# Patient Record
Sex: Female | Born: 1990 | Race: White | Hispanic: No | Smoking: Current every day smoker
Health system: Southern US, Community
[De-identification: ages and names within clinical notes are randomized; demographics above are authoritative.]

## PROBLEM LIST (undated history)

## (undated) ENCOUNTER — Emergency Department (HOSPITAL_COMMUNITY): Discharge status: Against Medical Advice | Payer: Medicaid Other

## (undated) ENCOUNTER — Inpatient Hospital Stay: Payer: Self-pay

## (undated) DIAGNOSIS — F419 Anxiety disorder, unspecified: Secondary | ICD-10-CM

## (undated) DIAGNOSIS — B1920 Unspecified viral hepatitis C without hepatic coma: Secondary | ICD-10-CM

## (undated) DIAGNOSIS — B2790 Infectious mononucleosis, unspecified without complication: Secondary | ICD-10-CM

## (undated) DIAGNOSIS — F1910 Other psychoactive substance abuse, uncomplicated: Secondary | ICD-10-CM

## (undated) DIAGNOSIS — D649 Anemia, unspecified: Secondary | ICD-10-CM

## (undated) DIAGNOSIS — F1110 Opioid abuse, uncomplicated: Secondary | ICD-10-CM

## (undated) DIAGNOSIS — A749 Chlamydial infection, unspecified: Secondary | ICD-10-CM

## (undated) DIAGNOSIS — F319 Bipolar disorder, unspecified: Secondary | ICD-10-CM

## (undated) DIAGNOSIS — K759 Inflammatory liver disease, unspecified: Secondary | ICD-10-CM

## (undated) DIAGNOSIS — N83209 Unspecified ovarian cyst, unspecified side: Secondary | ICD-10-CM

## (undated) HISTORY — PX: NO PAST SURGERIES: SHX2092

## (undated) HISTORY — PX: HAND SURGERY: SHX662

---
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
## 2012-01-24 ENCOUNTER — Inpatient Hospital Stay (HOSPITAL_COMMUNITY): Payer: Medicaid Other

## 2012-01-24 ENCOUNTER — Encounter (HOSPITAL_COMMUNITY): Payer: Self-pay | Admitting: *Deleted

## 2012-01-24 ENCOUNTER — Inpatient Hospital Stay (HOSPITAL_COMMUNITY)
Admission: AD | Admit: 2012-01-24 | Discharge: 2012-01-24 | Disposition: A | Discharge status: Home / Self Care | Payer: Medicaid Other | Source: Ambulatory Visit | Attending: Family Medicine | Admitting: Family Medicine

## 2012-01-24 DIAGNOSIS — N949 Unspecified condition associated with female genital organs and menstrual cycle: Secondary | ICD-10-CM | POA: Insufficient documentation

## 2012-01-24 DIAGNOSIS — N939 Abnormal uterine and vaginal bleeding, unspecified: Secondary | ICD-10-CM | POA: Insufficient documentation

## 2012-01-24 DIAGNOSIS — N926 Irregular menstruation, unspecified: Secondary | ICD-10-CM | POA: Insufficient documentation

## 2012-01-24 DIAGNOSIS — F17200 Nicotine dependence, unspecified, uncomplicated: Secondary | ICD-10-CM | POA: Insufficient documentation

## 2012-01-24 DIAGNOSIS — R102 Pelvic and perineal pain: Secondary | ICD-10-CM

## 2012-01-24 DIAGNOSIS — F1210 Cannabis abuse, uncomplicated: Secondary | ICD-10-CM | POA: Insufficient documentation

## 2012-01-24 DIAGNOSIS — R1032 Left lower quadrant pain: Secondary | ICD-10-CM | POA: Insufficient documentation

## 2012-01-24 HISTORY — DX: Chlamydial infection, unspecified: A74.9

## 2012-01-24 HISTORY — DX: Unspecified ovarian cyst, unspecified side: N83.209

## 2012-01-24 LAB — CBC
MCH: 27.1 pg (ref 26.0–34.0)
MCV: 85.4 fL (ref 78.0–100.0)
Platelets: 324 10*3/uL (ref 150–400)
RBC: 4.51 MIL/uL (ref 3.87–5.11)
RDW: 14.4 % (ref 11.5–15.5)

## 2012-01-24 LAB — URINE MICROSCOPIC-ADD ON

## 2012-01-24 LAB — WET PREP, GENITAL
Clue Cells Wet Prep HPF POC: NONE SEEN
Trich, Wet Prep: NONE SEEN
Yeast Wet Prep HPF POC: NONE SEEN

## 2012-01-24 LAB — URINALYSIS, ROUTINE W REFLEX MICROSCOPIC
Bilirubin Urine: NEGATIVE
Glucose, UA: NEGATIVE mg/dL
Ketones, ur: NEGATIVE mg/dL
Protein, ur: 30 mg/dL — AB

## 2012-01-24 MED ORDER — IBUPROFEN 600 MG PO TABS: 600.0000 mg | ORAL_TABLET | Freq: Four times a day (QID) | ORAL | Status: AC | PRN

## 2012-01-24 NOTE — Discharge Instructions (Signed)
Abnormal Uterine Bleeding Abnormal uterine bleeding can have many causes. Some cases are simply treated, while others are more serious. There are several kinds of bleeding that is considered abnormal, including:  Bleeding between periods.     Bleeding after sexual intercourse.     Spotting anytime in the menstrual cycle.     Bleeding heavier or more than normal.     Bleeding after menopause.  CAUSES   There are many causes of abnormal uterine bleeding. It can be present in teenagers, pregnant women, women during their reproductive years, and women who have reached menopause. Your caregiver will look for the more common causes depending on your age, signs, symptoms and your particular circumstance. Most cases are not serious and can be treated. Even the more serious causes, like cancer of the female organs, can be treated adequately if found in the early stages. That is why all types of bleeding should be evaluated and treated as soon as possible. DIAGNOSIS   Diagnosing the cause may take several kinds of tests. Your caregiver may:  Take a complete history of the type of bleeding.     Perform a complete physical exam and Pap smear.     Take an ultrasound on the abdomen showing a picture of the female organs and the pelvis.     Inject dye into the uterus and Fallopian tubes and X-ray them (hysterosalpingogram).     Place fluid in the uterus and do an ultrasound (sonohysterogrqphy).     Take a CT scan to examine the female organs and pelvis.     Take an MRI to examine the female organs and pelvis. There is no X-ray involved with this procedure.     Look inside the uterus with a telescope that has a light at the end (hysteroscopy).     Scrap the inside of the uterus to get tissue to examine (Dilatation and Curettage, D&C).     Look into the pelvis with a telescope that has a light at the end (laparoscopy). This is done through a very small cut (incision) in the abdomen.  TREATMENT     Treatment will depend on the cause of the abnormal bleeding. It can include:  Doing nothing to allow the problem to take care of itself over time.     Hormone treatment.     Birth control pills.     Treating the medical condition causing the problem.     Laparoscopy.    Major or minor surgery     Destroying the lining of the uterus with electrical currant, laser, freezing or heat (uterine ablation).  HOME CARE INSTRUCTIONS    Follow your caregiver's recommendation on how to treat your problem.     See your caregiver if you missed a menstrual period and think you may be pregnant.     If you are bleeding heavily, count the number of pads/tampons you use and how often you have to change them. Tell this to your caregiver.     Avoid sexual intercourse until the problem is controlled.  SEEK MEDICAL CARE IF:    You have any kind of abnormal bleeding mentioned above.     You feel dizzy at times.     You are 21 years old and have not had a menstrual period yet.  SEEK IMMEDIATE MEDICAL CARE IF:    You pass out.     You are changing pads/tampons every 15 to 30 minutes.     You have belly (abdominal)   pain.     You have a temperature of 100 F (37.8 C) or higher.     You become sweaty or weak.     You are passing large blood clots from the vagina.     You start to feel sick to your stomach (nauseous) and throw up (vomit).  Document Released: 11/14/2005 Document Revised: 07/27/2011 Document Reviewed: 04/09/2009 [employer] Patient Information 2012 [employer]. 

## 2012-01-24 NOTE — Progress Notes (Signed)
Patient states that she has been having abdominal pain for the last 4 months that is worse with her periods. Has been having two periods a month for the past two months. Was diagnosed with an ovarian cyst at [facility] ED in November and feels the pain and bleeding are the same. Vomiting daily for several weeks. Able to keep some food down.

## 2012-01-24 NOTE — ED Provider Notes (Signed)
Chart reviewed and agree with management and plan.  

## 2012-01-24 NOTE — ED Provider Notes (Signed)
Meghan Bullock y.o.[id number] presents to [facility] with left lower abdominal pain and vaginal bleeding.    Chief Complaint  Patient presents with  . Vaginal Bleeding  . Abdominal Pain    SUBJECTIVE  HPI: Pt presents to [facility] with report of LLQ pain x4 months and reports having 2 periods a month for the last 2 months.  She desires pregnancy and reports "I need to have something done about the cyst diagnosed in November so I can get pregnant".  Pt was referred to [facility] clinic from [facility] in November 2012 but did not f/u and was not seen at the clinic.  She reports some nausea and vomiting for a few days, but denies dizziness, h/a, urinary symptoms, vaginal itching/burning/discharge, or fever/chills.    OB History    Grav Para Term Preterm Abortions TAB SAB Ect Mult Living   2    1  1         Past Medical History  Diagnosis Date  . Mononucleosis   . Ovarian cyst   . Chlamydia    Past Surgical History  Procedure Date  . No past surgeries    History   Social History  . Marital Status: Single    Spouse Name: N/A    Number of Children: N/A  . Years of Education: N/A   Occupational History  . Not on file.   Social History Main Topics  . Smoking status: Current Everyday Smoker -- 0.5 packs/day for 5 years    Types: Cigarettes  . Smokeless tobacco: Not on file  . Alcohol Use: No  . Drug Use: Yes    Special: Marijuana     Smokes marijuana twice a week  . Sexually Active: Yes    Birth Control/ Protection: None   Other Topics Concern  . Not on file   Social History Narrative  . No narrative on file   No current facility-administered medications on file prior to encounter.   Current Outpatient Prescriptions on File Prior to Encounter  Medication Sig Dispense Refill  . amphetamine-dextroamphetamine (ADDERALL XR) 30 MG 24 hr capsule Take 30 mg by mouth every morning.         Allergies  Allergen Reactions  . Hydrocodone Itching    ROS: Pertinent items in  HPI  OBJECTIVE Blood pressure 128/71, pulse 84, temperature 97.8 F (36.6 C), temperature source Oral, resp. rate 16, height 5' 4.5" (1.638 m), weight 62.959 kg (138 lb 12.8 oz), last menstrual period 01/13/2012, SpO2 100.00%, unknown if currently breastfeeding. GENERAL: Well-developed, well-nourished female in no acute distress.  LUNGS: Clear to auscultation bilaterally.  HEART: Regular rate and rhythm. ABDOMEN: Soft, nontender EXTREMITIES: Nontender, no edema Pelvic exam: Vagina with normal rugae, bright red vaginal bleeding noted; cervix pink, without lesion, anterior position Bimanual exam:  Cervix 0/long/high, anterior, firm; adnexa without enlargement or palpable masses. Tenderness in LLQ of abdomen and left inguinal region Neg CVA tenderness Neg rebound tenderness, Neg McBurney's Point pain  RESULTS Results for orders placed during the hospital encounter of 01/24/12 (from the past 24 hour(s))  URINALYSIS, ROUTINE W REFLEX MICROSCOPIC     Status: Abnormal   Collection Time   01/24/12  2:20 PM      Component Value Range   Color, Urine YELLOW  YELLOW    APPearance CLEAR  CLEAR    Specific Gravity, Urine 1.020  1.005 - 1.030    pH 6.5  5.0 - 8.0    Glucose, UA NEGATIVE  NEGATIVE (mg/dL)  Hgb urine dipstick LARGE (*) NEGATIVE    Bilirubin Urine NEGATIVE  NEGATIVE    Ketones, ur NEGATIVE  NEGATIVE (mg/dL)   Protein, ur 30 (*) NEGATIVE (mg/dL)   Urobilinogen, UA 0.2  0.0 - 1.0 (mg/dL)   Nitrite NEGATIVE  NEGATIVE    Leukocytes, UA NEGATIVE  NEGATIVE   URINE MICROSCOPIC-ADD ON     Status: Abnormal   Collection Time   01/24/12  2:20 PM      Component Value Range   Squamous Epithelial / LPF FEW (*) RARE    RBC / HPF 11-20  <3 (RBC/hpf)   Bacteria, UA RARE  RARE   POCT PREGNANCY, URINE     Status: Normal   Collection Time   01/24/12  2:28 PM      Component Value Range   Preg Test, Ur NEGATIVE  NEGATIVE   CBC     Status: Normal   Collection Time   01/24/12  3:10 PM       Component Value Range   WBC 9.4  4.0 - 10.5 (K/uL)   RBC 4.51  3.87 - 5.11 (MIL/uL)   Hemoglobin 12.2  12.0 - 15.0 (g/dL)   HCT 16.1  09.6 - 04.5 (%)   MCV 85.4  78.0 - 100.0 (fL)   MCH 27.1  26.0 - 34.0 (pg)   MCHC 31.7  30.0 - 36.0 (g/dL)   RDW 40.9  81.1 - 91.4 (%)   Platelets 324  150 - 400 (K/uL)  WET PREP, GENITAL     Status: Abnormal   Collection Time   01/24/12  3:30 PM      Component Value Range   Yeast Wet Prep HPF POC NONE SEEN  NONE SEEN    Trich, Wet Prep NONE SEEN  NONE SEEN    Clue Cells Wet Prep HPF POC NONE SEEN  NONE SEEN    WBC, Wet Prep HPF POC FEW (*) NONE SEEN     IMAGING US Transvaginal Non-ob  01/24/2012  *RADIOLOGY REPORT*  Clinical Data: Left lower quadrant pain and abnormal uterine bleeding  TRANSABDOMINAL AND TRANSVAGINAL ULTRASOUND OF PELVIS Technique:  Both transabdominal and transvaginal ultrasound examinations of the pelvis were performed. Transabdominal technique was performed for global imaging of the pelvis including uterus, ovaries, adnexal regions, and pelvic cul-de-sac.  Comparison: January 13, 2012   It was necessary to proceed with endovaginal exam following the transabdominal exam to visualize the endometrium and adnexa.  Findings:  The uterus is normal in size and echotexture, measuring 7.4 x 2.7 x 3.8 cm.  The endometrial stripe is thin and homogeneous, measuring 3 mm in width.  Both ovaries have a normal size and appearance.  The right ovary measures 4.7 x 2.3 x 2.4 cm, and the left ovary measures 3.4 x 2.0 x 2.0 cm.  There are no adnexal masses or free pelvic fluid.  IMPRESSION: Normal study. No evidence of pelvic mass or other significant abnormality.  Original Report Authenticated By: Brandon Melnick, M.D.   US Pelvis Complete  01/24/2012  *RADIOLOGY REPORT*  Clinical Data: Left lower quadrant pain and abnormal uterine bleeding  TRANSABDOMINAL AND TRANSVAGINAL ULTRASOUND OF PELVIS Technique:  Both transabdominal and transvaginal ultrasound  examinations of the pelvis were performed. Transabdominal technique was performed for global imaging of the pelvis including uterus, ovaries, adnexal regions, and pelvic cul-de-sac.  Comparison: January 13, 2012   It was necessary to proceed with endovaginal exam following the transabdominal exam to visualize the endometrium and adnexa.  Findings:  The uterus is normal in size and echotexture, measuring 7.4 x 2.7 x 3.8 cm.  The endometrial stripe is thin and homogeneous, measuring 3 mm in width.  Both ovaries have a normal size and appearance.  The right ovary measures 4.7 x 2.3 x 2.4 cm, and the left ovary measures 3.4 x 2.0 x 2.0 cm.  There are no adnexal masses or free pelvic fluid.  IMPRESSION: Normal study. No evidence of pelvic mass or other significant abnormality.  Original Report Authenticated By: Brandon Melnick, M.D.   MANAGEMENT Discussed pt with Dr Shawnie Pons and she agrees with POC to D/C home with f/u in Gyn clinic.    ASSESSMENT Abnormal uterine bleeding Pelvic pain  PLAN D/C home with bleeding precautions Ibuprofen 600 mg PO Q 6 hours Plan for f/u in Gyn clinic

## 2012-02-15 ENCOUNTER — Inpatient Hospital Stay: Payer: Self-pay | Admitting: Psychiatry

## 2012-02-15 LAB — COMPREHENSIVE METABOLIC PANEL
Albumin: 3.9 g/dL (ref 3.8–5.6)
Alkaline Phosphatase: 75 U/L — ABNORMAL LOW (ref 82–169)
Anion Gap: 9 (ref 7–16)
BUN: 6 mg/dL — ABNORMAL LOW (ref 9–21)
Bilirubin,Total: 0.2 mg/dL (ref 0.2–1.0)
Chloride: 107 mmol/L (ref 97–107)
Creatinine: 0.7 mg/dL (ref 0.60–1.30)
EGFR (African American): >60
EGFR (Non-African Amer.): >60
Glucose: 112 mg/dL — ABNORMAL HIGH (ref 65–99)
Osmolality: 280 (ref 275–301)
SGOT(AST): 21 U/L (ref 0–26)
SGPT (ALT): 20 U/L
Sodium: 141 mmol/L (ref 132–141)
Total Protein: 7.6 g/dL (ref 6.4–8.6)

## 2012-02-15 LAB — URINALYSIS, COMPLETE
Bilirubin,UR: NEGATIVE
Hyaline Cast: 1
Ketone: NEGATIVE
Nitrite: NEGATIVE
Ph: 6 (ref 4.5–8.0)
Protein: NEGATIVE
Specific Gravity: 1.019 (ref 1.003–1.030)
WBC UR: 11 /HPF (ref 0–5)

## 2012-02-15 LAB — DRUG SCREEN, URINE
Amphetamines, Ur Screen: NEGATIVE (ref ?–1000)
Barbiturates, Ur Screen: NEGATIVE (ref ?–200)
Benzodiazepine, Ur Scrn: POSITIVE (ref ?–200)
Cannabinoid 50 Ng, Ur ~~LOC~~: POSITIVE (ref ?–50)
MDMA (Ecstasy)Ur Screen: NEGATIVE (ref ?–500)
Methadone, Ur Screen: NEGATIVE (ref ?–300)
Opiate, Ur Screen: POSITIVE (ref ?–300)
Phencyclidine (PCP) Ur S: NEGATIVE (ref ?–25)

## 2012-02-15 LAB — CBC WITH DIFFERENTIAL/PLATELET
Basophil #: 0 10*3/uL (ref 0.0–0.1)
Eosinophil %: 3 %
Lymphocyte #: 3 10*3/uL (ref 1.0–3.6)
Lymphocyte %: 22.9 %
MCV: 85 fL (ref 80–100)
Monocyte %: 7 %
Neutrophil #: 8.6 10*3/uL — ABNORMAL HIGH (ref 1.4–6.5)
RBC: 4.61 10*6/uL (ref 3.80–5.20)

## 2012-02-15 LAB — ACETAMINOPHEN LEVEL: Acetaminophen: <2 ug/mL

## 2012-02-15 LAB — PREGNANCY, URINE: Pregnancy Test, Urine: NEGATIVE m[IU]/mL

## 2012-02-15 LAB — ETHANOL
Ethanol %: <0.003 % (ref 0.000–0.080)
Ethanol: <3 mg/dL

## 2012-02-15 LAB — LITHIUM LEVEL: Lithium: 0.23 mmol/L — ABNORMAL LOW

## 2012-02-16 LAB — TSH: Thyroid Stimulating Horm: 0.402 u[IU]/mL — ABNORMAL LOW

## 2012-02-16 LAB — FOLATE: Folic Acid: 19.2 ng/mL (ref 3.1–100.0)

## 2012-02-19 LAB — BASIC METABOLIC PANEL
Calcium, Total: 9.3 mg/dL (ref 9.0–10.7)
Chloride: 104 mmol/L (ref 97–107)
EGFR (Non-African Amer.): >60
Glucose: 90 mg/dL (ref 65–99)
Osmolality: 269 (ref 275–301)
Potassium: 3.9 mmol/L (ref 3.3–4.7)
Sodium: 135 mmol/L (ref 132–141)

## 2012-02-19 LAB — LITHIUM LEVEL: Lithium: 0.74 mmol/L

## 2012-02-22 ENCOUNTER — Encounter: Payer: Medicaid Other | Admitting: Obstetrics and Gynecology

## 2012-02-28 ENCOUNTER — Emergency Department (HOSPITAL_COMMUNITY)
Admission: EM | Admit: 2012-02-28 | Discharge: 2012-02-28 | Disposition: A | Discharge status: Home / Self Care | Payer: Medicaid Other | Attending: Emergency Medicine | Admitting: Emergency Medicine

## 2012-02-28 ENCOUNTER — Encounter (HOSPITAL_COMMUNITY): Payer: Self-pay | Admitting: Emergency Medicine

## 2012-02-28 DIAGNOSIS — M79609 Pain in unspecified limb: Secondary | ICD-10-CM | POA: Insufficient documentation

## 2012-02-28 DIAGNOSIS — S51009A Unspecified open wound of unspecified elbow, initial encounter: Secondary | ICD-10-CM | POA: Insufficient documentation

## 2012-02-28 DIAGNOSIS — S41109A Unspecified open wound of unspecified upper arm, initial encounter: Secondary | ICD-10-CM | POA: Insufficient documentation

## 2012-02-28 DIAGNOSIS — M542 Cervicalgia: Secondary | ICD-10-CM | POA: Insufficient documentation

## 2012-02-28 DIAGNOSIS — S51011A Laceration without foreign body of right elbow, initial encounter: Secondary | ICD-10-CM

## 2012-02-28 MED ORDER — MORPHINE SULFATE 4 MG/ML IJ SOLN
4.0000 mg | Freq: Once | INTRAMUSCULAR | Status: AC
  Administered 2012-02-28: 4 mg via INTRAVENOUS
  Filled 2012-02-28: qty 1

## 2012-02-28 MED ORDER — CEPHALEXIN 250 MG PO CAPS
500.0000 mg | ORAL_CAPSULE | Freq: Once | ORAL | Status: AC
  Administered 2012-02-28: 500 mg via ORAL

## 2012-02-28 MED ORDER — TETANUS-DIPHTH-ACELL PERTUSSIS 5-2.5-18.5 LF-MCG/0.5 IM SUSP
INTRAMUSCULAR | Status: AC
  Administered 2012-02-28: 0.5 mL via INTRAMUSCULAR
  Filled 2012-02-28: qty 0.5

## 2012-02-28 MED ORDER — CEPHALEXIN 500 MG PO CAPS: 500.0000 mg | ORAL_CAPSULE | Freq: Four times a day (QID) | ORAL | Status: AC

## 2012-02-28 MED ORDER — ONDANSETRON HCL 4 MG/2ML IJ SOLN
INTRAMUSCULAR | Status: AC
  Administered 2012-02-28: 4 mg via INTRAVENOUS
  Filled 2012-02-28: qty 2

## 2012-02-28 MED ORDER — FENTANYL CITRATE 0.05 MG/ML IJ SOLN
INTRAMUSCULAR | Status: AC
  Administered 2012-02-28: 50 ug via INTRAVENOUS
  Filled 2012-02-28: qty 2

## 2012-02-28 MED ORDER — OXYCODONE-ACETAMINOPHEN 5-325 MG PO TABS: 2.0000 | ORAL_TABLET | ORAL | Status: AC | PRN

## 2012-02-28 MED ORDER — CEPHALEXIN 250 MG PO CAPS
ORAL_CAPSULE | ORAL | Status: AC
  Administered 2012-02-28: 500 mg via ORAL
  Filled 2012-02-28: qty 2

## 2012-02-28 MED ORDER — TETANUS-DIPHTH-ACELL PERTUSSIS 5-2.5-18.5 LF-MCG/0.5 IM SUSP
0.5000 mL | Freq: Once | INTRAMUSCULAR | Status: AC
  Administered 2012-02-28: 0.5 mL via INTRAMUSCULAR
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
## 2012-02-28 NOTE — ED Notes (Signed)
Suturing completed; wound dressed with 4x4 and curlex; sling applied by ortho tech.

## 2012-02-28 NOTE — ED Notes (Signed)
MD at bedside suturing; pt tolerating well.

## 2012-02-28 NOTE — ED Notes (Signed)
Sheriff deputy at bedside. 

## 2012-02-28 NOTE — ED Notes (Signed)
PT ambulated with a steady gait; VSS; A&Ox3; no signs of distress; respirations even and unlabored; skin warm and dry; no questions at this time.  

## 2012-02-28 NOTE — ED Notes (Signed)
Patient's family to come back. Pt still tearful and crying.
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
## 2012-02-28 NOTE — ED Notes (Signed)
Depth of wound to be determined by EDP consult to ortho pending

## 2012-05-26 ENCOUNTER — Encounter (HOSPITAL_COMMUNITY): Payer: Self-pay | Admitting: *Deleted

## 2012-05-26 DIAGNOSIS — R509 Fever, unspecified: Secondary | ICD-10-CM | POA: Insufficient documentation

## 2012-05-26 DIAGNOSIS — R112 Nausea with vomiting, unspecified: Secondary | ICD-10-CM | POA: Insufficient documentation

## 2012-05-26 DIAGNOSIS — R059 Cough, unspecified: Secondary | ICD-10-CM | POA: Insufficient documentation

## 2012-05-26 DIAGNOSIS — R05 Cough: Secondary | ICD-10-CM | POA: Insufficient documentation

## 2012-05-26 NOTE — ED Notes (Signed)
Pt states that she has had body aches for the past three days, and the fever came on today. pt states that she feels weak and has not been able to eat much over the past couple of days because it causes abdominal pain.

## 2012-05-27 ENCOUNTER — Emergency Department (HOSPITAL_COMMUNITY)
Admission: EM | Admit: 2012-05-27 | Discharge: 2012-05-27 | Disposition: A | Discharge status: Home / Self Care | Payer: Medicaid Other | Attending: Emergency Medicine | Admitting: Emergency Medicine

## 2012-05-27 DIAGNOSIS — B349 Viral infection, unspecified: Secondary | ICD-10-CM

## 2012-05-27 LAB — URINALYSIS, ROUTINE W REFLEX MICROSCOPIC
Protein, ur: NEGATIVE mg/dL
Urobilinogen, UA: 0.2 mg/dL (ref 0.0–1.0)

## 2012-05-27 LAB — PREGNANCY, URINE: Preg Test, Ur: NEGATIVE

## 2012-05-27 MED ORDER — SODIUM CHLORIDE 0.9 % IV BOLUS (SEPSIS)
1000.0000 mL | Freq: Once | INTRAVENOUS | Status: AC
  Administered 2012-05-27: 1000 mL via INTRAVENOUS

## 2012-05-27 MED ORDER — ACETAMINOPHEN 325 MG PO TABS
650.0000 mg | ORAL_TABLET | Freq: Once | ORAL | Status: AC
  Administered 2012-05-27: 650 mg via ORAL
  Filled 2012-05-27: qty 2

## 2012-05-27 MED ORDER — OXYCODONE-ACETAMINOPHEN 5-325 MG PO TABS: 1.0000 | ORAL_TABLET | ORAL | Status: DC | PRN

## 2012-05-27 MED ORDER — MORPHINE SULFATE 4 MG/ML IJ SOLN
4.0000 mg | Freq: Once | INTRAMUSCULAR | Status: AC
  Administered 2012-05-27: 4 mg via INTRAVENOUS
  Filled 2012-05-27: qty 1

## 2012-05-27 MED ORDER — KETOROLAC TROMETHAMINE 30 MG/ML IJ SOLN
30.0000 mg | Freq: Once | INTRAMUSCULAR | Status: AC
  Administered 2012-05-27: 30 mg via INTRAVENOUS
  Filled 2012-05-27: qty 1

## 2012-05-27 MED ORDER — IBUPROFEN 400 MG PO TABS
400.0000 mg | ORAL_TABLET | Freq: Once | ORAL | Status: AC
  Administered 2012-05-27: 400 mg via ORAL
  Filled 2012-05-27: qty 1

## 2012-05-27 MED ORDER — ONDANSETRON HCL 4 MG/2ML IJ SOLN
4.0000 mg | Freq: Once | INTRAMUSCULAR | Status: AC
  Administered 2012-05-27: 4 mg via INTRAVENOUS
  Filled 2012-05-27: qty 2

## 2012-05-27 NOTE — ED Notes (Signed)
Pt reports lower abdominal pain, n/v/d for the past three days.  Pt stated that she had a fever yesterday of 101.0.

## 2012-05-27 NOTE — ED Notes (Signed)
Waiting to speak to a provide about pain med and ABT.

## 2012-05-27 NOTE — ED Notes (Signed)
Went in to discharge pt, stated she will not leave until to speak to provide to give her pain and antbiotic

## 2012-05-27 NOTE — ED Provider Notes (Signed)
History     CSN: [id number]  Arrival date & time 05/26/12  2307   First MD Initiated Contact with Patient 05/27/12 0119      Chief Complaint  Patient presents with  . Generalized Body Aches  . Fever     Patient is a 21 y.o. female presenting with fever. The history is provided by the patient.  Fever Primary symptoms of the febrile illness include fever, fatigue, headaches, cough, abdominal pain, nausea, vomiting and diarrhea. Primary symptoms do not include shortness of breath, altered mental status or rash. The current episode started 2 days ago. This is a new problem. The problem has been gradually worsening.  pt reports fever, vomiting, diarrhea, mylagias, sore throat, and cough for past 2 days No vaginal discharge No dysuria No rash No travel No tick bites No sick contacts  Past Medical History  Diagnosis Date  . Mononucleosis   . Ovarian cyst   . Chlamydia     Past Surgical History  Procedure Date  . No past surgeries     History reviewed. No pertinent family history.  History  Substance Use Topics  . Smoking status: Current Everyday Smoker -- 0.5 packs/day for 5 years    Types: Cigarettes  . Smokeless tobacco: Not on file  . Alcohol Use: No    OB History    Grav Para Term Preterm Abortions TAB SAB Ect Mult Living   2    1  1          Review of Systems  Constitutional: Positive for fever and fatigue.  Respiratory: Positive for cough. Negative for shortness of breath.   Gastrointestinal: Positive for nausea, vomiting, abdominal pain and diarrhea.  Skin: Negative for rash.  Neurological: Positive for headaches.  Psychiatric/Behavioral: Negative for altered mental status.  All other systems reviewed and are negative.    Allergies  Hydrocodone and Tramadol  Home Medications  No current outpatient prescriptions on file.  BP 125/70  Pulse 110  Temp 100 F (37.8 C) (Oral)  Resp 20  SpO2 98%  Physical Exam CONSTITUTIONAL: Well developed/well  nourished HEAD AND FACE: Normocephalic/atraumatic EYES: EOMI/PERRL ENMT: Mucous membranes moist, uvula midline, pharynx normal NECK: supple no meningeal signs SPINE:entire spine nontender CV: S1/S2 noted, no murmurs/rubs/gallops noted LUNGS: Lungs are clear to auscultation bilaterally, no apparent distress ABDOMEN: soft, nontender, no rebound or guarding GU:no cva tenderness NEURO: Pt is awake/alert, moves all extremitiesx4 EXTREMITIES: pulses normal, full ROM SKIN: warm, color normal, no rash, no petechiae PSYCH: no abnormalities of mood noted  ED Course  Procedures   Labs Reviewed  URINALYSIS, ROUTINE W REFLEX MICROSCOPIC - Abnormal; Notable for the following:    Leukocytes, UA TRACE (*)     All other components within normal limits  URINE MICROSCOPIC-ADD ON - Abnormal; Notable for the following:    Bacteria, UA FEW (*)     All other components within normal limits  PREGNANCY, URINE   Pt reported what appears to be  viral syndrome.  Well appearing,  IV fluids and then reassess   PT WALKING AROUND ED IN NO DISTRESS SHE FEELS IMPROVED STABLE FOR D/C   MDM  Nursing notes including past medical history and social history reviewed and considered in documentation All labs/vitals reviewed and considered         Joya Gaskins, MD 05/27/12 [phone]

## 2012-06-05 ENCOUNTER — Emergency Department (HOSPITAL_COMMUNITY)
Admission: EM | Admit: 2012-06-05 | Discharge: 2012-06-05 | Disposition: A | Discharge status: Home / Self Care | Payer: Medicaid Other | Attending: Emergency Medicine | Admitting: Emergency Medicine

## 2012-06-05 ENCOUNTER — Encounter (HOSPITAL_COMMUNITY): Payer: Self-pay | Admitting: *Deleted

## 2012-06-05 DIAGNOSIS — N390 Urinary tract infection, site not specified: Secondary | ICD-10-CM | POA: Insufficient documentation

## 2012-06-05 DIAGNOSIS — A5901 Trichomonal vulvovaginitis: Secondary | ICD-10-CM | POA: Insufficient documentation

## 2012-06-05 DIAGNOSIS — F17200 Nicotine dependence, unspecified, uncomplicated: Secondary | ICD-10-CM | POA: Insufficient documentation

## 2012-06-05 LAB — URINALYSIS, ROUTINE W REFLEX MICROSCOPIC
Bilirubin Urine: NEGATIVE
Nitrite: POSITIVE — AB
Specific Gravity, Urine: 1.023 (ref 1.005–1.030)
pH: 6 (ref 5.0–8.0)

## 2012-06-05 LAB — URINE MICROSCOPIC-ADD ON

## 2012-06-05 LAB — PREGNANCY, URINE: Preg Test, Ur: NEGATIVE

## 2012-06-05 LAB — WET PREP, GENITAL

## 2012-06-05 MED ORDER — METRONIDAZOLE 500 MG PO TABS: 500.0000 mg | ORAL_TABLET | Freq: Two times a day (BID) | ORAL | Status: DC

## 2012-06-05 MED ORDER — CEPHALEXIN 500 MG PO CAPS: 500.0000 mg | ORAL_CAPSULE | Freq: Four times a day (QID) | ORAL | Status: DC

## 2012-06-05 NOTE — ED Provider Notes (Signed)
I saw and evaluated the patient, reviewed the resident's note and I agree with the findings and plan.   Loren Racer, MD 06/05/12 1406

## 2012-06-05 NOTE — ED Provider Notes (Signed)
History     CSN: [id number]  Arrival date & time 06/05/12  0821   First MD Initiated Contact with Patient 06/05/12 [phone]      Chief Complaint  Patient presents with  . Abdominal Pain  . SEXUALLY TRANSMITTED DISEASE    (Consider location/radiation/quality/duration/timing/severity/associated sxs/prior treatment) Patient is a 21 y.o. female presenting with abdominal pain. The history is provided by the patient and medical records.  Abdominal Pain The primary symptoms of the illness include abdominal pain, nausea, vomiting, dysuria and vaginal discharge. The primary symptoms of the illness do not include fever, shortness of breath, diarrhea or vaginal bleeding. The current episode started more than 2 days ago. The onset of the illness was gradual. The problem has not changed since onset. Pain Location: lower. The abdominal pain does not radiate. Pain scale: moderate.  The dysuria is associated with frequency. The dysuria is not associated with hematuria or vaginal pain.  The vaginal discharge is associated with dysuria.  The patient states that she believes she is currently not pregnant. The patient has not had a change in bowel habit. Additional symptoms associated with the illness include frequency. Symptoms associated with the illness do not include chills, constipation, hematuria or back pain.    Past Medical History  Diagnosis Date  . Mononucleosis   . Ovarian cyst   . Chlamydia     Past Surgical History  Procedure Date  . No past surgeries     No family history on file.  History  Substance Use Topics  . Smoking status: Current Everyday Smoker -- 0.5 packs/day for 5 years    Types: Cigarettes  . Smokeless tobacco: Not on file  . Alcohol Use: No    OB History    Grav Para Term Preterm Abortions TAB SAB Ect Mult Living   2    1  1          Review of Systems  Constitutional: Negative for fever and chills.  Respiratory: Negative for cough and shortness of breath.     Cardiovascular: Negative for chest pain and palpitations.  Gastrointestinal: Positive for nausea, vomiting and abdominal pain. Negative for diarrhea, constipation, blood in stool and abdominal distention.  Genitourinary: Positive for dysuria, frequency and vaginal discharge. Negative for hematuria, vaginal bleeding, vaginal pain and pelvic pain.  Musculoskeletal: Negative for back pain.  Skin: Negative for color change and rash.  Neurological: Negative for light-headedness and headaches.  All other systems reviewed and are negative.    Allergies  Hydrocodone and Tramadol  Home Medications  No current outpatient prescriptions on file.  BP 116/83  Pulse 84  Temp 98.4 F (36.9 C) (Oral)  Resp 16  SpO2 100%  Physical Exam  Nursing note and vitals reviewed. Constitutional: She is oriented to person, place, and time. She appears well-developed and well-nourished.  HENT:  Head: Normocephalic and atraumatic.  Eyes: Pupils are equal, round, and reactive to light.  Cardiovascular: Normal rate, regular rhythm, normal heart sounds and intact distal pulses.   Pulmonary/Chest: Effort normal and breath sounds normal. No respiratory distress.  Abdominal: Soft. Bowel sounds are normal. She exhibits no distension. There is tenderness (mild in upper abd, moderate in lower abd; no CVA tenderness). There is no rebound and no guarding.  Genitourinary: Cervix exhibits discharge (white, muscousy, malodorous). Cervix exhibits no motion tenderness and no friability. Right adnexum displays no tenderness. Left adnexum displays no tenderness. There is bleeding (small, dark red, no clots) around the vagina.  Increased discomfort with pelvic beyond what is expected, but no CMT or focal tenderness  Neurological: She is alert and oriented to person, place, and time.  Skin: Skin is warm and dry.  Psychiatric: She has a normal mood and affect.    ED Course  Procedures (including critical care  time)  Labs Reviewed  URINALYSIS, ROUTINE W REFLEX MICROSCOPIC - Abnormal; Notable for the following:    APPearance CLOUDY (*)     Hgb urine dipstick LARGE (*)     Protein, ur 30 (*)     Nitrite POSITIVE (*)     Leukocytes, UA MODERATE (*)     All other components within normal limits  WET PREP, GENITAL - Abnormal; Notable for the following:    Trich, Wet Prep RARE (*)     Clue Cells Wet Prep HPF POC FEW (*)     WBC, Wet Prep HPF POC FEW (*)     All other components within normal limits  URINE MICROSCOPIC-ADD ON - Abnormal; Notable for the following:    Squamous Epithelial / LPF FEW (*)     Bacteria, UA MANY (*)     All other components within normal limits  PREGNANCY, URINE  GC/CHLAMYDIA PROBE AMP, GENITAL   No results found.   1. UTI (urinary tract infection)   2. Trichomonal vaginitis       MDM  This is a 21 year old female who presents today with lower abdominal pain, urinary frequency, and "I think I have Chlamydia again." She states that she has had about a week's worth of primarily lower abdominal pain, significant urinary frequency, as well as dysuria. She has had some vaginal discharge and discomfort, and that it feels like when she has previously had Chlamydia. She was seen on 6/30 for generalized body aches and fever; at that time had some nausea and vomiting with a temperature up to 101. Her workup at that time was unremarkable, and she was discharged after symptomatic treatment with a viral syndrome. Since then she has had worsening abdominal pain, frequency, dysuria and discharge. She has not had any further fevers, but does still have some nausea but her vomiting has mostly resolved. She does have mild diffuse abdominal tenderness, moderate in the lower abdomen, and pelvic exam shows moderate discomfort with speculum and bimanual exam, with thick mucousy discharge and a small amount of dark red blood without clots. There is no cervical motion tenderness, no adnexal or  uterine tenderness; exam not consistent with PID or cervicitis.  The patient's labs are significant for a UTI and trichomonas. Discussed with the patient treatment, importance of having her sexual partners treating, avoiding unprotected sex until they are both treated, indications for return, and pt expresses understanding.      Theotis Burrow, MD 06/05/12 [phone]

## 2012-06-05 NOTE — ED Notes (Signed)
Pt reports continual abdominal pain since last visit last week. Reports urinary frequency, painful urination, white vaginal discharge.

## 2012-06-06 LAB — GC/CHLAMYDIA PROBE AMP, GENITAL
Chlamydia, DNA Probe: NEGATIVE
GC Probe Amp, Genital: POSITIVE — AB

## 2012-06-07 NOTE — ED Notes (Addendum)
+  Gonorrhea. Chart sent to EDP office for review. 

## 2012-06-08 ENCOUNTER — Encounter (HOSPITAL_COMMUNITY): Payer: Self-pay | Admitting: Emergency Medicine

## 2012-06-08 ENCOUNTER — Emergency Department (HOSPITAL_COMMUNITY)
Admission: EM | Admit: 2012-06-08 | Discharge: 2012-06-08 | Disposition: A | Discharge status: Home / Self Care | Payer: Medicaid Other | Attending: Emergency Medicine | Admitting: Emergency Medicine

## 2012-06-08 DIAGNOSIS — A5400 Gonococcal infection of lower genitourinary tract, unspecified: Secondary | ICD-10-CM | POA: Insufficient documentation

## 2012-06-08 DIAGNOSIS — F17200 Nicotine dependence, unspecified, uncomplicated: Secondary | ICD-10-CM | POA: Insufficient documentation

## 2012-06-08 DIAGNOSIS — A549 Gonococcal infection, unspecified: Secondary | ICD-10-CM

## 2012-06-08 MED ORDER — AZITHROMYCIN 250 MG PO TABS
1000.0000 mg | ORAL_TABLET | Freq: Once | ORAL | Status: AC
  Administered 2012-06-08: 1000 mg via ORAL
  Filled 2012-06-08: qty 1
  Filled 2012-06-08: qty 4

## 2012-06-08 MED ORDER — CEFTRIAXONE SODIUM 250 MG IJ SOLR
250.0000 mg | Freq: Once | INTRAMUSCULAR | Status: AC
  Administered 2012-06-08: 250 mg via INTRAMUSCULAR
  Filled 2012-06-08: qty 250

## 2012-06-08 MED ORDER — ETODOLAC 500 MG PO TABS: 500.0000 mg | ORAL_TABLET | Freq: Two times a day (BID) | ORAL | Status: DC

## 2012-06-08 NOTE — ED Notes (Signed)
Pt was called today from the hospital and told she needed to be treated for gonorrhea.Pt states she has chills

## 2012-06-08 NOTE — ED Provider Notes (Signed)
History     CSN: [id number]  Arrival date & time 06/08/12  1631   First MD Initiated Contact with Patient 06/08/12 1657      Chief Complaint  Patient presents with  . SEXUALLY TRANSMITTED DISEASE    (Consider location/radiation/quality/duration/timing/severity/associated sxs/prior treatment) HPI Presents to the emergency room to receive a ceftriaxone injection. She was seen in the emergency room earlier this week and had a pelvic exam performed. The culture result was positive for gonorrhea. Patient was discharged home on metronidazole and given a prescription for Keflex to treat urinary tract infection. Patient is having persistent pain. She is not having any nausea vomiting diarrhea or fever. She has been instructed to have her partner treated and to refrain from any intercourse Past Medical History  Diagnosis Date  . Mononucleosis   . Ovarian cyst   . Chlamydia     Past Surgical History  Procedure Date  . No past surgeries     History reviewed. No pertinent family history.  History  Substance Use Topics  . Smoking status: Current Everyday Smoker -- 0.5 packs/day for 5 years    Types: Cigarettes  . Smokeless tobacco: Not on file  . Alcohol Use: No    OB History    Grav Para Term Preterm Abortions TAB SAB Ect Mult Living   2    1  1          Review of Systems  All other systems reviewed and are negative.    Allergies  Hydrocodone; Tylenol; and Tramadol  Home Medications   Current Outpatient Rx  Name Route Sig Dispense Refill  . ASPIRIN-ACETAMINOPHEN-CAFFEINE 250-250-65 MG PO TABS Oral Take 2 tablets by mouth every 6 (six) hours as needed. For pain    . CEPHALEXIN 500 MG PO CAPS Oral Take 500 mg by mouth 4 (four) times daily.    . IBUPROFEN 200 MG PO TABS Oral Take 800 mg by mouth every 6 (six) hours as needed. For pain    . METRONIDAZOLE 500 MG PO TABS Oral Take 500 mg by mouth 2 (two) times daily.    . ETODOLAC 500 MG PO TABS Oral Take 1 tablet (500 mg  total) by mouth 2 (two) times daily. 20 tablet 0    BP 115/69  Pulse 94  Temp 98.8 F (37.1 C) (Oral)  Resp 16  SpO2 99%  Physical Exam  Nursing note and vitals reviewed. Constitutional: She appears well-developed and well-nourished. No distress.  HENT:  Head: Normocephalic and atraumatic.  Right Ear: External ear normal.  Left Ear: External ear normal.  Eyes: Conjunctivae are normal. Right eye exhibits no discharge. Left eye exhibits no discharge. No scleral icterus.  Neck: Neck supple. No tracheal deviation present.  Cardiovascular: Normal rate.   Pulmonary/Chest: Effort normal. No stridor. No respiratory distress.  Abdominal: There is tenderness (suprapubic).  Musculoskeletal: She exhibits no edema.  Neurological: She is alert. Cranial nerve deficit: no gross deficits.  Skin: Skin is warm and dry. No rash noted.  Psychiatric: She has a normal mood and affect.    ED Course  Procedures (including critical care time)  Labs Reviewed - No data to display No results found.   1. Gonorrhea       MDM  Treated for std exposure.  Change med to etodolac as pt states that naprosyn was not effective        Celene Kras, MD 06/08/12 1707

## 2012-06-08 NOTE — ED Notes (Signed)
Patient should return for Ceftriaxone 250 mg IM X 1 per Lorenz Coaster PAC.

## 2012-06-08 NOTE — ED Notes (Signed)
Patient informed of positive results after idx 2 and informed of need to notify partner to be treated. 

## 2012-07-24 ENCOUNTER — Emergency Department (HOSPITAL_COMMUNITY): Payer: Medicaid Other

## 2012-07-24 ENCOUNTER — Encounter (HOSPITAL_COMMUNITY): Payer: Self-pay | Admitting: Emergency Medicine

## 2012-07-24 ENCOUNTER — Emergency Department (HOSPITAL_COMMUNITY)
Admission: EM | Admit: 2012-07-24 | Discharge: 2012-07-25 | Disposition: A | Discharge status: Home / Self Care | Payer: Medicaid Other | Attending: Emergency Medicine | Admitting: Emergency Medicine

## 2012-07-24 DIAGNOSIS — F17200 Nicotine dependence, unspecified, uncomplicated: Secondary | ICD-10-CM | POA: Insufficient documentation

## 2012-07-24 DIAGNOSIS — S022XXA Fracture of nasal bones, initial encounter for closed fracture: Secondary | ICD-10-CM

## 2012-07-24 DIAGNOSIS — T07XXXA Unspecified multiple injuries, initial encounter: Secondary | ICD-10-CM

## 2012-07-24 DIAGNOSIS — IMO0002 Reserved for concepts with insufficient information to code with codable children: Secondary | ICD-10-CM | POA: Insufficient documentation

## 2012-07-24 DIAGNOSIS — F411 Generalized anxiety disorder: Secondary | ICD-10-CM | POA: Insufficient documentation

## 2012-07-24 DIAGNOSIS — F319 Bipolar disorder, unspecified: Secondary | ICD-10-CM | POA: Insufficient documentation

## 2012-07-24 HISTORY — DX: Bipolar disorder, unspecified: F31.9

## 2012-07-24 HISTORY — DX: Anxiety disorder, unspecified: F41.9
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
## 2012-07-25 MED ORDER — IBUPROFEN 800 MG PO TABS: 800.0000 mg | ORAL_TABLET | Freq: Three times a day (TID) | ORAL | Status: AC | PRN

## 2012-07-25 NOTE — ED Provider Notes (Signed)
Medical screening examination/treatment/procedure(s) were performed by non-physician practitioner and as supervising physician I was immediately available for consultation/collaboration.  Halsey Hammen K Aritzel Krusemark-Rasch, MD 07/25/12 0209 
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
## 2012-07-31 ENCOUNTER — Emergency Department: Payer: Self-pay | Admitting: Emergency Medicine

## 2012-07-31 LAB — COMPREHENSIVE METABOLIC PANEL
Alkaline Phosphatase: 80 U/L — ABNORMAL LOW (ref 82–169)
Anion Gap: 7 (ref 7–16)
BUN: 6 mg/dL — ABNORMAL LOW (ref 7–18)
Bilirubin,Total: 0.2 mg/dL (ref 0.2–1.0)
Chloride: 105 mmol/L (ref 98–107)
Creatinine: 0.89 mg/dL (ref 0.60–1.30)
EGFR (African American): >60
Glucose: 102 mg/dL — ABNORMAL HIGH (ref 65–99)
SGOT(AST): 14 U/L (ref 0–26)
SGPT (ALT): 18 U/L (ref 12–78)
Total Protein: 6.6 g/dL (ref 6.4–8.6)

## 2012-07-31 LAB — CBC
HCT: 34 % — ABNORMAL LOW (ref 35.0–47.0)
HGB: 11.5 g/dL — ABNORMAL LOW (ref 12.0–16.0)
MCH: 28 pg (ref 26.0–34.0)
MCV: 83 fL (ref 80–100)
Platelet: 251 10*3/uL (ref 150–440)
RBC: 4.1 10*6/uL (ref 3.80–5.20)
RDW: 17.2 % — ABNORMAL HIGH (ref 11.5–14.5)
WBC: 11.1 10*3/uL — ABNORMAL HIGH (ref 3.6–11.0)

## 2012-07-31 LAB — DRUG SCREEN, URINE
Amphetamines, Ur Screen: NEGATIVE (ref ?–1000)
Benzodiazepine, Ur Scrn: POSITIVE (ref ?–200)
Cannabinoid 50 Ng, Ur ~~LOC~~: POSITIVE (ref ?–50)
Cocaine Metabolite,Ur ~~LOC~~: NEGATIVE (ref ?–300)
MDMA (Ecstasy)Ur Screen: POSITIVE (ref ?–500)
Opiate, Ur Screen: POSITIVE (ref ?–300)
Phencyclidine (PCP) Ur S: NEGATIVE (ref ?–25)

## 2012-07-31 LAB — URINALYSIS, COMPLETE
Bacteria: NONE SEEN
Bilirubin,UR: NEGATIVE
Blood: NEGATIVE
Nitrite: NEGATIVE
Specific Gravity: 1.015 (ref 1.003–1.030)
Squamous Epithelial: 2

## 2012-07-31 LAB — ETHANOL
Ethanol %: <0.003 % (ref 0.000–0.080)
Ethanol: <3 mg/dL

## 2012-07-31 LAB — PREGNANCY, URINE: Pregnancy Test, Urine: NEGATIVE m[IU]/mL

## 2012-07-31 LAB — TSH: Thyroid Stimulating Horm: 0.72 u[IU]/mL

## 2012-08-06 ENCOUNTER — Encounter (HOSPITAL_COMMUNITY): Payer: Self-pay | Admitting: *Deleted

## 2012-08-06 ENCOUNTER — Emergency Department (HOSPITAL_COMMUNITY)
Admission: EM | Admit: 2012-08-06 | Discharge: 2012-08-06 | Discharge status: Against Medical Advice | Payer: Medicaid Other | Attending: Emergency Medicine | Admitting: Emergency Medicine

## 2012-08-06 DIAGNOSIS — R1110 Vomiting, unspecified: Secondary | ICD-10-CM | POA: Insufficient documentation

## 2012-08-06 DIAGNOSIS — IMO0001 Reserved for inherently not codable concepts without codable children: Secondary | ICD-10-CM | POA: Insufficient documentation

## 2012-08-06 DIAGNOSIS — R51 Headache: Secondary | ICD-10-CM | POA: Insufficient documentation

## 2012-08-06 NOTE — ED Notes (Signed)
The pt has a cold she has a headache  Vomiting and aching all over her body sorethroat  For 48 hours

## 2012-09-02 ENCOUNTER — Encounter (HOSPITAL_COMMUNITY): Payer: Self-pay | Admitting: Emergency Medicine

## 2012-09-02 ENCOUNTER — Emergency Department (HOSPITAL_COMMUNITY)
Admission: EM | Admit: 2012-09-02 | Discharge: 2012-09-02 | Disposition: A | Discharge status: Home / Self Care | Payer: Medicaid Other | Attending: Emergency Medicine | Admitting: Emergency Medicine

## 2012-09-02 DIAGNOSIS — M7989 Other specified soft tissue disorders: Secondary | ICD-10-CM | POA: Insufficient documentation

## 2012-09-02 NOTE — ED Notes (Signed)
Pt reports that 3 days ago noticed that L finger has been swelling; denies drainage, but does report pain

## 2012-09-02 NOTE — ED Notes (Signed)
No answer in waiting room or triage for room assignment

## 2012-09-03 ENCOUNTER — Emergency Department (INDEPENDENT_AMBULATORY_CARE_PROVIDER_SITE_OTHER)
Admission: EM | Admit: 2012-09-03 | Discharge: 2012-09-03 | Disposition: A | Discharge status: Home / Self Care | Payer: Medicaid Other | Source: Home / Self Care

## 2012-09-03 ENCOUNTER — Encounter (HOSPITAL_COMMUNITY): Payer: Self-pay | Admitting: *Deleted

## 2012-09-03 DIAGNOSIS — L03019 Cellulitis of unspecified finger: Secondary | ICD-10-CM

## 2012-09-03 DIAGNOSIS — M79609 Pain in unspecified limb: Secondary | ICD-10-CM

## 2012-09-03 DIAGNOSIS — M79646 Pain in unspecified finger(s): Secondary | ICD-10-CM

## 2012-09-03 DIAGNOSIS — IMO0001 Reserved for inherently not codable concepts without codable children: Secondary | ICD-10-CM

## 2012-09-03 MED ORDER — NAPROXEN 375 MG PO TABS: 375.0000 mg | ORAL_TABLET | Freq: Two times a day (BID) | ORAL | Status: DC

## 2012-09-03 MED ORDER — CEPHALEXIN 500 MG PO CAPS: 500.0000 mg | ORAL_CAPSULE | Freq: Three times a day (TID) | ORAL | Status: DC

## 2012-09-03 NOTE — ED Notes (Signed)
When asked if she has been taking any IBU or anything for pain during triage, stated, "I'm allergic to IBU, but I've been taking Advil".  Upon discharge, patient asked what she got to help with the pain - informed of Rx for Naproxen, or that she may continue with her Advil.  Then stated, "But she told me she would give me a little something stronger to help me sleep."  Educated pt that Naproxen is stronger than the Advil, and that by taking the abx and performing soaks the pain will start to improve.  Then stated, "What was that medicine again?'.  After explanation and discharge completed, patient stated, "Um, that Naproxen stuff doesn't work."  Instructed she may then choose to continue to just take her Advil.

## 2012-09-03 NOTE — ED Provider Notes (Signed)
Medical screening examination/treatment/procedure(s) were performed by resident physician or non-physician practitioner and as supervising physician I was immediately available for consultation/collaboration.   KINDL,JAMES DOUGLAS MD.    James D Kindl, MD 09/03/12 2049 

## 2012-09-03 NOTE — ED Notes (Signed)
States uses condoms always when sexually active; had negative urine HCG 2 days ago.

## 2012-09-03 NOTE — ED Notes (Signed)
Started to notice swelling and pain to cuticle area of left index finger 5 days ago.  Area is not draining, but pus noted below surface of skin.  States pain and swelling are getting worse each day.  Has been taking Advil and soaking in Epsom salts.

## 2012-09-03 NOTE — ED Provider Notes (Signed)
History     CSN: [id number]  Arrival date & time 09/03/12  1938   None     Chief Complaint  Patient presents with  . Hand Pain    (Consider location/radiation/quality/duration/timing/severity/associated sxs/prior treatment) Patient is a 21 y.o. female presenting with hand pain. The history is provided by the patient.  Hand Pain This is a new problem. The current episode started more than 2 days ago. The problem occurs daily. The problem has been gradually worsening. Pertinent negatives include no abdominal pain. The symptoms are aggravated by bending. Nothing relieves the symptoms. Treatments tried: advil at home. The treatment provided mild relief.   Meghan Bullock is a 21 y.o. female who complains of left index finger pain for five days, no known injury, states noted drainage from finger.  Has been soaking in warm water and took advil for pain.  No history of same.  Reports movement of finger makes symptoms worse.    Past Medical History  Diagnosis Date  . Mononucleosis   . Ovarian cyst   . Chlamydia   . Bipolar 1 disorder   . Anxiety   . Asthma     Past Surgical History  Procedure Date  . No past surgeries     No family history on file.  History  Substance Use Topics  . Smoking status: Current Every Day Smoker -- 1.0 packs/day for 5 years    Types: Cigarettes  . Smokeless tobacco: Not on file  . Alcohol Use: No    OB History    Grav Para Term Preterm Abortions TAB SAB Ect Mult Living   2    1  1          Review of Systems  Constitutional: Negative.   Respiratory: Negative.   Cardiovascular: Negative.   Gastrointestinal: Negative for abdominal pain.  Musculoskeletal: Positive for arthralgias. Negative for myalgias, back pain, joint swelling and gait problem.  Skin: Positive for wound. Negative for color change, pallor and rash.    Allergies  Hydrocodone; Advil; and Tramadol  Home Medications   Current Outpatient Rx  Name Route Sig Dispense  Refill  . PROAIR HFA IN Inhalation Inhale into the lungs as needed.    . CEPHALEXIN 500 MG PO CAPS Oral Take 1 capsule (500 mg total) by mouth 3 (three) times daily. With food 30 capsule 0  . NAPROXEN 375 MG PO TABS Oral Take 1 tablet (375 mg total) by mouth 2 (two) times daily. 20 tablet 0    BP 130/76  Pulse 98  Temp 99.2 F (37.3 C) (Oral)  Resp 16  SpO2 100%  LMP 08/04/2012  Physical Exam  Nursing note and vitals reviewed. Constitutional: She is oriented to person, place, and time. Vital signs are normal. She appears well-developed and well-nourished. She is active and cooperative.  HENT:  Head: Normocephalic.  Eyes: Conjunctivae normal are normal. Pupils are equal, round, and reactive to light. No scleral icterus.  Neck: Trachea normal. Neck supple.  Cardiovascular: Normal rate and regular rhythm.   Pulmonary/Chest: Effort normal and breath sounds normal.  Musculoskeletal: Normal range of motion.       Hands: Neurological: She is alert and oriented to person, place, and time. No cranial nerve deficit or sensory deficit.  Skin: Skin is warm and dry.  Psychiatric: She has a normal mood and affect. Her speech is normal and behavior is normal. Judgment and thought content normal. Cognition and memory are normal.    ED Course  Procedures (including  critical care time)  Labs Reviewed - No data to display No results found.   1. Finger pain   2. Paronychia of second finger, left       MDM  Warm soaks, begin antibiotics as prescribed. Naproxen for pain/discomfort, follow up in 4 days to ensure improvement.        Johnsie Kindred, NP 09/03/12 2048

## 2012-09-05 ENCOUNTER — Emergency Department (HOSPITAL_COMMUNITY)
Admission: EM | Admit: 2012-09-05 | Discharge: 2012-09-05 | Disposition: A | Discharge status: Home / Self Care | Payer: Medicaid Other | Attending: Emergency Medicine | Admitting: Emergency Medicine

## 2012-09-05 ENCOUNTER — Emergency Department (HOSPITAL_COMMUNITY): Payer: Medicaid Other

## 2012-09-05 ENCOUNTER — Encounter (HOSPITAL_COMMUNITY): Payer: Self-pay | Admitting: Emergency Medicine

## 2012-09-05 DIAGNOSIS — IMO0001 Reserved for inherently not codable concepts without codable children: Secondary | ICD-10-CM

## 2012-09-05 DIAGNOSIS — L03012 Cellulitis of left finger: Secondary | ICD-10-CM

## 2012-09-05 DIAGNOSIS — L03019 Cellulitis of unspecified finger: Secondary | ICD-10-CM | POA: Insufficient documentation

## 2012-09-05 DIAGNOSIS — J45909 Unspecified asthma, uncomplicated: Secondary | ICD-10-CM | POA: Insufficient documentation

## 2012-09-05 MED ORDER — SULFAMETHOXAZOLE-TMP DS 800-160 MG PO TABS
1.0000 | ORAL_TABLET | Freq: Once | ORAL | Status: AC
  Administered 2012-09-05: 1 via ORAL
  Filled 2012-09-05: qty 1

## 2012-09-05 MED ORDER — SULFAMETHOXAZOLE-TRIMETHOPRIM 800-160 MG PO TABS: 1.0000 | ORAL_TABLET | Freq: Two times a day (BID) | ORAL | Status: DC

## 2012-09-05 MED ORDER — OXYCODONE-ACETAMINOPHEN 5-325 MG PO TABS: 1.0000 | ORAL_TABLET | ORAL | Status: DC | PRN

## 2012-09-05 MED ORDER — BUPIVACAINE HCL (PF) 0.5 % IJ SOLN
10.0000 mL | Freq: Once | INTRAMUSCULAR | Status: DC
  Filled 2012-09-05: qty 10

## 2012-09-05 NOTE — ED Provider Notes (Signed)
History     CSN: [id number]  Arrival date & time 09/05/12  1219   First MD Initiated Contact with Patient 09/05/12 1428      Chief Complaint  Patient presents with  . Finger Injury    (Consider location/radiation/quality/duration/timing/severity/associated sxs/prior treatment) HPI Comments: Meghan Bullock is a 21 y.o. Female who presents with complaint of left index finger swelling and pain. States swelling first started about a week ago. States was seen at urgent care 2 days ago. started on keflex for infection. Symptoms worsening. No having pain to entire finger, with red streaking down the hand. Pt denies any injuries. Denies fever, chills malailse. Finger painful with any movement and palpation.    Past Medical History  Diagnosis Date  . Mononucleosis   . Ovarian cyst   . Chlamydia   . Bipolar 1 disorder   . Anxiety   . Asthma     Past Surgical History  Procedure Date  . No past surgeries     No family history on file.  History  Substance Use Topics  . Smoking status: Current Every Day Smoker -- 1.0 packs/day for 5 years    Types: Cigarettes  . Smokeless tobacco: Not on file  . Alcohol Use: No    OB History    Grav Para Term Preterm Abortions TAB SAB Ect Mult Living   2    1  1          Review of Systems  Constitutional: Negative for fever and chills.  Musculoskeletal: Positive for joint swelling and arthralgias.  Skin: Positive for color change and wound.  Neurological: Negative for weakness and numbness.  All other systems reviewed and are negative.    Allergies  Hydrocodone; Advil; and Tramadol  Home Medications   Current Outpatient Rx  Name Route Sig Dispense Refill  . ALBUTEROL SULFATE HFA 108 (90 BASE) MCG/ACT IN AERS Inhalation Inhale 2 puffs into the lungs every 6 (six) hours as needed. For shortness of breath    . CEPHALEXIN 500 MG PO CAPS Oral Take 1 capsule (500 mg total) by mouth 3 (three) times daily. With food 30 capsule 0  .  NAPROXEN 375 MG PO TABS Oral Take 1 tablet (375 mg total) by mouth 2 (two) times daily. 20 tablet 0    BP 120/80  Pulse 96  Temp 98.8 F (37.1 C) (Oral)  Resp 18  SpO2 98%  LMP 08/24/2012  Physical Exam  Nursing note and vitals reviewed. Constitutional: She appears well-developed and well-nourished. No distress.  Eyes: Conjunctivae normal are normal.  Cardiovascular: Normal rate, regular rhythm and normal heart sounds.   Pulmonary/Chest: Effort normal and breath sounds normal. No respiratory distress. She has no wheezes.  Musculoskeletal:       Left index finger with impressive paronychia, swelling extends mainly over the dorsal aspect of the finger all the way to the MCP joint. Tender over entire finger. There is a red streak going down dorsal hand, tender to palpation  Neurological: She is alert.  Skin: Skin is warm.  Psychiatric: She has a normal mood and affect. Her behavior is normal.    ED Course  Procedures (including critical care time)  Labs Reviewed - No data to display Dg Finger Index Left  09/05/2012  *RADIOLOGY REPORT*  Clinical Data: Nail bed infection.  LEFT INDEX FINGER 2+V  Comparison: None.  Findings: There is soft tissue swelling along the distal aspect of the finger without underlying acute osseous or joint abnormality.  IMPRESSION: Soft tissue swelling without acute osseous or joint abnormality.   Original Report Authenticated By: Reyes Ivan, M.D.    INCISION AND DRAINAGE Performed by: Jaynie Crumble A Consent: Verbal consent obtained. Risks and benefits: risks, benefits and alternatives were discussed Type: paronychia  Body area: left index finger  Anesthesia: local infiltration  Local anesthetic:bupivacaine 0.5% wo epinephrine, digital block of the 2nd left finger performed with adequate anesthesia  Anesthetic total: 4 ml  Complexity: complex   Drainage: purulent  Drainage amount: large  Wound packed with iodoform gauze  Patient  tolerance: Patient tolerated the procedure well with no immediate complications.   4:02 PM I&D of paronychia performed with large purulent drainage. Pt is able to move finger all the way once numbed.  Wound culture sent. Spoke with Dr. Mina Marble, hand specialist, will follow up with pt in the office. Asked to pack it, warm soaks at home, antibiotics.  Filed Vitals:   09/05/12 1231  BP: 120/80  Pulse: 96  Temp: 98.8 F (37.1 C)  Resp: 18    1. Paronychia of second finger of left hand   2. Cellulitis of finger of left hand       MDM          Lottie Mussel, PA 09/05/12 1604

## 2012-09-05 NOTE — ED Notes (Signed)
Onset one week ago left index finger pain and light pink red no trauma.   Seen at urgent care recently and given antibiotics however pain increasing and not feeling better. Pain 10/10 sharp.

## 2012-09-05 NOTE — ED Provider Notes (Signed)
Meghan Bullock is a 21 y.o. female being evaluated for finger pain, status post treatment for paronychia with oral antibiotics, she has ongoing pain, swelling; without systemic symptoms. On examination. The left index finger has evident paronychia, with small abscess, and mild. Associated swelling of the distal finger. There is no significant felon. There is a red streak extending to the dorsal hand. She has decreased range of motion of the affected finger, due to pain.   Evaluation is consistent with paronychia and lymphangitis. Doubt septic joint, tenosynovitis, or bacteremia.    Medical screening examination/treatment/procedure(s) were conducted as a shared visit with non-physician practitioner(s) and myself.  I personally evaluated the patient during the encounter  Flint Melter, MD 09/05/12 1747

## 2012-09-08 ENCOUNTER — Telehealth (HOSPITAL_COMMUNITY): Payer: Self-pay | Admitting: Emergency Medicine

## 2012-09-08 LAB — WOUND CULTURE

## 2012-09-08 NOTE — ED Notes (Signed)
+  Wound. +MRSA. Patient treated with Septra DS. Sensitive to same. Per protocol MD. Will call and inform patient of +MRSA and appropriate treatment. °

## 2012-11-01 ENCOUNTER — Encounter (HOSPITAL_COMMUNITY): Payer: Self-pay

## 2012-11-01 ENCOUNTER — Emergency Department (HOSPITAL_COMMUNITY)
Admission: EM | Admit: 2012-11-01 | Discharge: 2012-11-01 | Disposition: A | Discharge status: Home / Self Care | Payer: Medicaid Other | Attending: Emergency Medicine | Admitting: Emergency Medicine

## 2012-11-01 DIAGNOSIS — F1210 Cannabis abuse, uncomplicated: Secondary | ICD-10-CM | POA: Insufficient documentation

## 2012-11-01 DIAGNOSIS — Z8742 Personal history of other diseases of the female genital tract: Secondary | ICD-10-CM | POA: Insufficient documentation

## 2012-11-01 DIAGNOSIS — F1910 Other psychoactive substance abuse, uncomplicated: Secondary | ICD-10-CM | POA: Insufficient documentation

## 2012-11-01 DIAGNOSIS — F319 Bipolar disorder, unspecified: Secondary | ICD-10-CM | POA: Insufficient documentation

## 2012-11-01 DIAGNOSIS — Z139 Encounter for screening, unspecified: Secondary | ICD-10-CM

## 2012-11-01 DIAGNOSIS — F1110 Opioid abuse, uncomplicated: Secondary | ICD-10-CM | POA: Insufficient documentation

## 2012-11-01 DIAGNOSIS — F411 Generalized anxiety disorder: Secondary | ICD-10-CM | POA: Insufficient documentation

## 2012-11-01 DIAGNOSIS — J45909 Unspecified asthma, uncomplicated: Secondary | ICD-10-CM | POA: Insufficient documentation

## 2012-11-01 DIAGNOSIS — F1120 Opioid dependence, uncomplicated: Secondary | ICD-10-CM | POA: Insufficient documentation

## 2012-11-01 DIAGNOSIS — F1920 Other psychoactive substance dependence, uncomplicated: Secondary | ICD-10-CM

## 2012-11-01 DIAGNOSIS — F17200 Nicotine dependence, unspecified, uncomplicated: Secondary | ICD-10-CM | POA: Insufficient documentation

## 2012-11-01 DIAGNOSIS — F1191 Opioid use, unspecified, in remission: Secondary | ICD-10-CM | POA: Insufficient documentation

## 2012-11-01 HISTORY — DX: Opioid abuse, uncomplicated: F11.10

## 2012-11-01 LAB — RAPID URINE DRUG SCREEN, HOSP PERFORMED
Cocaine: POSITIVE — AB
Opiates: POSITIVE — AB
Tetrahydrocannabinol: POSITIVE — AB

## 2012-11-01 LAB — COMPREHENSIVE METABOLIC PANEL
ALT: 27 U/L (ref 0–35)
Calcium: 9.2 mg/dL (ref 8.4–10.5)
GFR calc Af Amer: >90 mL/min (ref >90)
Glucose, Bld: 126 mg/dL — ABNORMAL HIGH (ref 70–99)
Sodium: 139 mEq/L (ref 135–145)
Total Protein: 6.7 g/dL (ref 6.0–8.3)

## 2012-11-01 LAB — CBC
Hemoglobin: 12 g/dL (ref 12.0–15.0)
MCH: 28.4 pg (ref 26.0–34.0)
MCHC: 32.9 g/dL (ref 30.0–36.0)

## 2012-11-01 LAB — POCT PREGNANCY, URINE: Preg Test, Ur: NEGATIVE

## 2012-11-01 LAB — ETHANOL: Alcohol, Ethyl (B): <11 mg/dL (ref 0–11)

## 2012-11-01 LAB — SALICYLATE LEVEL: Salicylate Lvl: <2 mg/dL — ABNORMAL LOW (ref 2.8–20.0)

## 2012-11-01 MED ORDER — ACETAMINOPHEN 325 MG PO TABS: 650.0000 mg | ORAL_TABLET | Freq: Four times a day (QID) | ORAL | Status: DC | PRN

## 2012-11-01 NOTE — Discharge Instructions (Signed)
 Chemical Dependency Chemical dependency is an addiction to drugs or alcohol . It is characterized by the repeated behavior of seeking out and using drugs and alcohol  despite harmful consequences to the health and safety of ones self and others.  RISK FACTORS There are certain situations or behaviors that increase a person's risk for chemical dependency. These include:  A family history of chemical dependency.  A history of mental health issues, including depression and anxiety.  A home environment where drugs and alcohol  are easily available to you.  Drug or alcohol  use at a young age. SYMPTOMS  The following symptoms can indicate chemical dependency:  Inability to limit the use of drugs or alcohol .  Nausea, sweating, shakiness, and anxiety that occurs when alcohol  or drugs are not being used.  An increase in amount of drugs or alcohol  that is necessary to get drunk or high. People who experience these symptoms can assess their use of drugs and alcohol  by asking themselves the following questions:  Have you been told by friends or family that they are worried about your use of alcohol  or drugs?  Do friends and family ever tell you about things you did while drinking alcohol  or using drugs that you do not remember?  Do you lie about using alcohol  or drugs or about the amounts you use?  Do you have difficulty completing daily tasks unless you use alcohol  or drugs?  Is the level of your work or school performance lower because of your drug or alcohol  use?  Do you get sick from using drugs or alcohol  but keep using anyway?  Do you feel uncomfortable in social situations unless you use alcohol  or drugs?  Do you use drugs or alcohol  to help forget problems? An answer of yes to any of these questions may indicate chemical dependency. Professional evaluation is suggested. Document Released: 11/08/2001 Document Revised: 02/06/2012 Document Reviewed: 01/20/2011 [facility] Patient  Information 2013 [place], [place].      RESOURCE GUIDE  Chronic Pain Problems: Contact [facility]  [phone] Patients need to be referred by their primary care doctor.  Insufficient Money for Medicine: Contact United Way:  call 211.   No Primary Care Doctor: - Call Health Connect  [phone] - can help you locate a primary care doctor that  accepts your insurance, provides certain services, etc. - Physician Referral [phone]  Agencies that provide inexpensive medical care: - [facility]  [phone] - [facility]  [phone] - [facility]  [phone] - Women's Clinic  [phone] - Planned Parenthood  [phone] [facility]  [phone]  Medicaid-accepting [facility] Providers: - [facility]- [address], [address]  [phone], Mon-Fri 9am-7pm, Sat 9am-1pm - [facility]- [address] [place], [address] [place]  [phone] - [facility]- [address] [place]  [phone] [facility]- [address]  [phone] - Kennieth Leech- [address] [place], [address], [phone]  Only accepts [place] patients after they have their name  applied to their card  Self Pay (no insurance) in [place]: - Sickle Cell Patients: Dr Camellia Hutchinson, [facility] Internal Medicine  [address] [place], [phone] - [facility]- [address] [place]  [phone]       [place] [facility] - [address]       -  [facility]- see information above (Speak to [employer] if you do not have insurance)       -  [facility]- [address] [place],  [phone]       -  [facility]- [address], [phone]       -  Dr Catalina-  [address], [address], [place], [phone]       -  Urgent Medical and [facility] - [address] [phone]       -  [facility]- [address] [phone], also [address] [phone]       -    [facility]- [address] [place], [phone], 1st & 3rd Saturday        every month, 10am-1pm  1) Find a Doctor and Pay Out of Pocket Although you won't have to find out who is covered by your insurance plan, it is a good idea to ask around and get recommendations. You will then need to call the office and see if the doctor you have chosen will accept you as a new patient and what types of options they offer for patients who are self-pay. Some doctors offer discounts or will set up payment plans for their patients who do not have insurance, but you will need to ask so you aren't surprised when you get to your appointment.  2) Contact Your Local Health Department Not all health departments have doctors that can see patients for sick visits, but many do, so it is worth a call to see if yours does. If you don't know where your local health department is, you can check in your phone book. The CDC also has a tool to help you locate your state's health department, and many state websites also have listings of all of their local health departments.  3) Find a Walk-in Clinic If your illness is not likely to be very severe or complicated, you may want to try a walk in clinic. These are popping up all over the country in pharmacies, drugstores, and shopping centers. They're usually staffed by nurse practitioners or physician assistants that have been trained to treat common illnesses and complaints. They're usually fairly quick and inexpensive. However, if you have serious medical issues or chronic medical problems, these are probably not your best option  STD Testing - [facility] Department of [facility] [place], STD Clinic, [address] [place], phone [phone] or [phone].  Monday - Friday, call for an appointment. [facility] Department of [employer], STD  Clinic, [address], [place], phone [phone] or [phone].  Monday - Friday, call for an appointment.  Abuse/Neglect: [facility] Child Abuse Hotline 559-321-8732 [facility] Child Abuse Hotline [phone] (After Hours)  Emergency Shelter:  [facility] [phone]  Maternity Homes: - [facility] [phone] - [facility] [phone]  MRSA Hotline #:   289-516-7922  [facility] Resources [facility]  United Way [facility] Dept. [address]                 [address]         [address] [place] [address]  [address]  Keenan Keenan Phone:  [phone]                                  Phone:  [phone]                   Phone:  [phone]  [facility], [phone] - [facility]- [phone]       -     [facility] in [place], [address]             [phone], Insurance  [place] Child Abuse Hotline (952)632-2293 or 786-677-5745 (After Hours)  Dental Assistance  If unable to pay or uninsured, contact:  [facility]. to become qualified for the adult dental clinic.  Patients with Medicaid: [facility] [phone] [address], [phone] [address], [phone]  If unable to pay, or uninsured, contact [facility] [phone] in [place], [phone] in [facility]) to become qualified for the adult dental clinic  Other Low-Cost Community Dental Services: - [facility]- [address] [place], [place], [place], [address], [phone], Ext. 123, 2nd and 4th Thursday of the month at 6:30am.  10 clients each day by appointment, can sometimes see walk-in patients if someone does not show for an appointment. [facility]- [address] [place], [place], [address], [phone] - [facility] [address], [place], [place], [address], [phone] - [facility]- [phone] [facility] Health Department- [phone] [facility] Health [phone]       Behavioral Health Resources in the [facility]  Intensive Outpatient Programs: [facility]      [address] [place], [place] [phone] Both a day and evening program       [facility] Outpatient     [address]        [place], [place] [address] [phone]         ADS: Alcohol  & Drug Svcs [address] [place] [phone]  [facility] Mental Health ACCESS LINE: [phone] or [phone] [address] [place], [place] [address] [email or web address]  Behavioral Health Services  Substance Abuse Resources: - Alcohol  and Drug Services  [phone] - [facility] [phone] - [facility] [phone] [place] [place] [phone] - Residential & Outpatient Substance Abuse Program  [phone]  Psychological Services: [facility]  [phone] Services  [phone] - [facility] [place], [phone] [place]. [address] [place], ACCESS LINE: [phone] or [phone], [email or web address]  Mobile Crisis Teams:                                        Therapeutic Alternatives         Mobile Crisis Care Unit [phone]             [facility] [address] [place] [phone]  Interventionist [facility] [address] [place] [phone]  Self-Help/Support Groups: Mental Health Assoc. of [employer] of support groups [phone] (call for more info)   Narcotics Anonymous (NA) Caring Services [address] [place] - 2 meetings at this location  Residential Treatment Programs:  [facility]      [address]        [place]       [phone]         [facility] [address], [place] [address]81 [place], [place]  [address] [phone]  [facility] Treatment Facility  [address] [place], [place] [address] [phone] Admissions: 8am-3pm M-F  [facility]     [address]        [place], [place] [address]       [phone]         [facility] [address] Christianna COYER [place], [place] [phone]  [facility] [address] [place], [place] [phone]  [facility] - Intensive Outpatient      [address]     [place], [place]       [phone]         [facility] (Addiction Recovery Care [facility]     [address] [place], [place] [phone] or [phone]  Residential Treatment Services (RTS), Medicaid [address] [place], [place] [phone]  [facility] [address]                                               [address] [place] [phone]  [facility] Resources: [phone]               General Therapy                                                Mliss Rival, PhD        [address] [place], [place] [address]         [phone]   Insurance  [facility] Behavioral   [address] [place], [place] [address] [phone]  [facility] [address] [place], [place] [address] [phone] Insurance/Medicaid/sponsorship through [facility]                                              [address]                                        [place], [place] [address]    Therapy/tele-psych/case         [phone]          [facility] [address] [place], [place]  [address]  Adolescent/group home/case  management [phone]                                            Recardo Rival PhD       General therapy       Insurance   [phone]         Dr. Curry, Insurance, M-F [phone]

## 2012-11-01 NOTE — ED Provider Notes (Addendum)
History   This chart was scribed for Meghan Bullock. Oletta Lamas, MD by Sofie Rower, ED Scribe. The patient was seen in room TR11C/TR11C and the patient's care was started at 2:18PM.    CSN: [id number]  Arrival date & time 11/01/12  1358   First MD Initiated Contact with Patient 11/01/12 1418      Chief Complaint  Patient presents with  . Medical Clearance  . Withdrawal    (Consider location/radiation/quality/duration/timing/severity/associated sxs/prior treatment) The history is provided by the patient. No language interpreter was used.    Meghan Bullock is a 21 y.o. female , with a hx of Heroin abuse, who presents to the Emergency Department complaining of sudden, progressively worsening, withdrawal, onset two days ago (10/30/12). The pt reports she has just been released from jail, however, she admits she has used heroin and smoked marijuana prior to entering rehabilitation. The pt informs her last administration of heroin was two days ago, in addition to smoking marijuana immediately before being evaluated at [facility] today.  The pt is a current everyday smoker (1.0 packs/day), however, she does not drink alcohol.      Past Medical History  Diagnosis Date  . Mononucleosis   . Ovarian cyst   . Chlamydia   . Bipolar 1 disorder   . Anxiety   . Asthma   . Heroin abuse     Past Surgical History  Procedure Date  . No past surgeries     History reviewed. No pertinent family history.  History  Substance Use Topics  . Smoking status: Current Every Day Smoker -- 1.0 packs/day for 5 years    Types: Cigarettes  . Smokeless tobacco: Not on file  . Alcohol Use: No    OB History    Grav Para Term Preterm Abortions TAB SAB Ect Mult Living   2    1  1          Review of Systems  Constitutional: Negative for fever.  Respiratory: Negative for cough and shortness of breath.   Cardiovascular: Negative for chest pain.  Gastrointestinal: Negative for nausea, vomiting and abdominal pain.   Neurological: Negative for speech difficulty.  All other systems reviewed and are negative.    Allergies  Hydrocodone; Advil; and Tramadol  Home Medications   No current outpatient prescriptions on file.  BP 123/64  Pulse 118  Temp 98.8 F (37.1 C) (Oral)  Resp 17  SpO2 100%  Physical Exam  Nursing note and vitals reviewed. Constitutional: She is oriented to person, place, and time. She appears well-developed and well-nourished. No distress.  HENT:  Head: Normocephalic and atraumatic.  Nose: Nose normal.  Neck: Normal range of motion.  Cardiovascular: Regular rhythm and normal heart sounds.  Tachycardia present.   Pulmonary/Chest: Effort normal. No respiratory distress.  Abdominal: Soft. She exhibits no distension. There is no tenderness.  Musculoskeletal: She exhibits no edema.  Neurological: She is alert and oriented to person, place, and time. No cranial nerve deficit. She exhibits normal muscle tone. Coordination normal.  Skin: Skin is warm and dry. She is not diaphoretic.  Psychiatric: She is slowed.       Responses slowed but appropriate. No gross Neuro deficits.     ED Course  Procedures (including critical care time)  DIAGNOSTIC STUDIES: Oxygen Saturation is 100% on room air, normal by my interpretation.    COORDINATION OF CARE:  2:41 PM- Treatment plan discussed with patient. Pt agrees with treatment.      Labs Reviewed  CBC - Abnormal; Notable for the following:    WBC 11.0 (*)     All other components within normal limits  COMPREHENSIVE METABOLIC PANEL - Abnormal; Notable for the following:    Potassium 3.2 (*)     Glucose, Bld 126 (*)     Albumin 3.4 (*)     All other components within normal limits  SALICYLATE LEVEL - Abnormal; Notable for the following:    Salicylate Lvl <2.0 (*)     All other components within normal limits  URINE RAPID DRUG SCREEN (HOSP PERFORMED) - Abnormal; Notable for the following:    Opiates POSITIVE (*)      Cocaine POSITIVE (*)     Benzodiazepines POSITIVE (*)     Tetrahydrocannabinol POSITIVE (*)     All other components within normal limits  ACETAMINOPHEN LEVEL  ETHANOL  POCT PREGNANCY, URINE   No results found.   1. Polysubstance (including opioids) dependence w/o physiol dependence   2. Screening     3:21 PM Spoke to ACT who agrees, that if she is medically cleared, can be discharged and she can [facility] with paperwork showing she has been cleared medically.  K+ of 3.2 is of no consequence, will be corrected with normal diet.  WBC of 11 is also non specific.  No emergent condition based on WBC of 11.    MDM  I personally performed the services described in this documentation, which was scribed in my presence. The recorded information has been reviewed and is accurate.    Pt reports no shaking, seizure activity.  She last used heroin (snorted it) more than 30 hours ago.  There is no withdrawal symptoms.  [facility] is not currently available.  If [facility], then I think pt can go there immediately.  There is not withdrawal or sig medical concerns regarding her marijuana use.  She is medically cleared for further substance abuse help.     Meghan Bullock. Oletta Lamas, MD 11/01/12 1513  Meghan Bullock. Oletta Lamas, MD 11/01/12 1523  Meghan Bullock. Kimmie Doren, MD 11/01/12 [phone]

## 2012-11-01 NOTE — ED Notes (Signed)
Pt here requesting detox from heroin; pt sts was in jail for 35 days until 2 days ago and then used heroin x 1 and smoked marijuana; pt sts spoke with [facility] and they will accept here there

## 2012-11-30 ENCOUNTER — Encounter (HOSPITAL_BASED_OUTPATIENT_CLINIC_OR_DEPARTMENT_OTHER): Payer: Self-pay | Admitting: *Deleted

## 2012-11-30 ENCOUNTER — Emergency Department (HOSPITAL_BASED_OUTPATIENT_CLINIC_OR_DEPARTMENT_OTHER)
Admission: EM | Admit: 2012-11-30 | Discharge: 2012-11-30 | Disposition: A | Discharge status: Home / Self Care | Payer: Medicaid Other | Attending: Emergency Medicine | Admitting: Emergency Medicine

## 2012-11-30 DIAGNOSIS — Z8742 Personal history of other diseases of the female genital tract: Secondary | ICD-10-CM | POA: Insufficient documentation

## 2012-11-30 DIAGNOSIS — IMO0001 Reserved for inherently not codable concepts without codable children: Secondary | ICD-10-CM | POA: Insufficient documentation

## 2012-11-30 DIAGNOSIS — R1110 Vomiting, unspecified: Secondary | ICD-10-CM | POA: Insufficient documentation

## 2012-11-30 DIAGNOSIS — J45909 Unspecified asthma, uncomplicated: Secondary | ICD-10-CM | POA: Insufficient documentation

## 2012-11-30 DIAGNOSIS — Z8619 Personal history of other infectious and parasitic diseases: Secondary | ICD-10-CM | POA: Insufficient documentation

## 2012-11-30 DIAGNOSIS — F319 Bipolar disorder, unspecified: Secondary | ICD-10-CM | POA: Insufficient documentation

## 2012-11-30 DIAGNOSIS — F17200 Nicotine dependence, unspecified, uncomplicated: Secondary | ICD-10-CM | POA: Insufficient documentation

## 2012-11-30 DIAGNOSIS — Z8659 Personal history of other mental and behavioral disorders: Secondary | ICD-10-CM | POA: Insufficient documentation

## 2012-11-30 DIAGNOSIS — F1110 Opioid abuse, uncomplicated: Secondary | ICD-10-CM | POA: Insufficient documentation

## 2012-11-30 LAB — URINALYSIS, ROUTINE W REFLEX MICROSCOPIC
Glucose, UA: NEGATIVE mg/dL
Hgb urine dipstick: NEGATIVE
Specific Gravity, Urine: 1.025 (ref 1.005–1.030)
pH: 8 (ref 5.0–8.0)

## 2012-11-30 LAB — PREGNANCY, URINE: Preg Test, Ur: NEGATIVE

## 2012-11-30 MED ORDER — ONDANSETRON 8 MG PO TBDP
8.0000 mg | ORAL_TABLET | Freq: Once | ORAL | Status: AC
  Administered 2012-11-30: 8 mg via ORAL
  Filled 2012-11-30: qty 1

## 2012-11-30 MED ORDER — PROMETHAZINE HCL 25 MG PO TABS: 25.0000 mg | ORAL_TABLET | Freq: Four times a day (QID) | ORAL | Status: DC | PRN

## 2012-11-30 NOTE — ED Provider Notes (Signed)
History     CSN: [id number]  Arrival date & time 11/30/12  1014   First MD Initiated Contact with Patient 11/30/12 1044      Chief Complaint  Patient presents with  . Nasal Congestion  . Emesis    (Consider location/radiation/quality/duration/timing/severity/associated sxs/prior treatment) HPI Comments: Patient presents with n/v that started this morning.  She is currently in treatment at [facility] for heroin addiction.  She denies fevers or chills.  No diarrhea.    Patient is a 22 y.o. female presenting with vomiting. The history is provided by the patient.  Emesis  This is a new problem. Episode onset: this morning. The problem occurs continuously. The problem has not changed since onset.The emesis has an appearance of stomach contents. There has been no fever. Associated symptoms include myalgias. Pertinent negatives include no chills and no fever.    Past Medical History  Diagnosis Date  . Mononucleosis   . Ovarian cyst   . Chlamydia   . Bipolar 1 disorder   . Anxiety   . Asthma   . Heroin abuse     Past Surgical History  Procedure Date  . No past surgeries     History reviewed. No pertinent family history.  History  Substance Use Topics  . Smoking status: Current Every Day Smoker -- 1.0 packs/day for 5 years    Types: Cigarettes  . Smokeless tobacco: Not on file  . Alcohol Use: No    OB History    Grav Para Term Preterm Abortions TAB SAB Ect Mult Living   2    1  1          Review of Systems  Constitutional: Negative for fever and chills.  Gastrointestinal: Positive for vomiting.  Musculoskeletal: Positive for myalgias.  All other systems reviewed and are negative.    Allergies  Hydrocodone; Advil; and Tramadol  Home Medications  No current outpatient prescriptions on file.  BP 129/85  Pulse 91  Temp 98.6 F (37 C) (Oral)  Resp 22  Ht 5\' 4"  (1.626 m)  Wt 148 lb (67.132 kg)  BMI 25.40 kg/m2  SpO2 100%  LMP 11/17/2012  Physical Exam    Nursing note and vitals reviewed. Constitutional: She is oriented to person, place, and time. She appears well-developed and well-nourished. No distress.  HENT:  Head: Normocephalic and atraumatic.  Neck: Normal range of motion. Neck supple.  Cardiovascular: Normal rate and regular rhythm.  Exam reveals no gallop and no friction rub.   No murmur heard. Pulmonary/Chest: Effort normal and breath sounds normal. No respiratory distress. She has no wheezes.  Abdominal: Soft. Bowel sounds are normal. She exhibits no distension. There is no tenderness.  Musculoskeletal: Normal range of motion.  Neurological: She is alert and oriented to person, place, and time.  Skin: Skin is warm and dry. She is not diaphoretic.    ED Course  Procedures (including critical care time)  Labs Reviewed - No data to display No results found.   No diagnosis found.    MDM  Symptoms sound viral in nature.  Urine is clear.  Was given zofran and is keeping down water.  Will discharge with same.  Return prn.        Geoffery Lyons, MD 11/30/12 1157

## 2012-11-30 NOTE — ED Notes (Signed)
Pt states " I really just need something for nausea and maybe something to dry up my runny nose everybody over at [facility] has this crud"

## 2012-11-30 NOTE — ED Notes (Signed)
[place] this am with cough nasal congestion and some vomiting

## 2013-01-10 ENCOUNTER — Emergency Department (HOSPITAL_COMMUNITY)
Admission: EM | Admit: 2013-01-10 | Discharge: 2013-01-10 | Disposition: A | Discharge status: Home / Self Care | Payer: Medicaid Other | Attending: Emergency Medicine | Admitting: Emergency Medicine

## 2013-01-10 ENCOUNTER — Encounter (HOSPITAL_COMMUNITY): Payer: Self-pay | Admitting: Neurology

## 2013-01-10 DIAGNOSIS — Z8659 Personal history of other mental and behavioral disorders: Secondary | ICD-10-CM | POA: Insufficient documentation

## 2013-01-10 DIAGNOSIS — IMO0001 Reserved for inherently not codable concepts without codable children: Secondary | ICD-10-CM

## 2013-01-10 DIAGNOSIS — O99330 Smoking (tobacco) complicating pregnancy, unspecified trimester: Secondary | ICD-10-CM | POA: Insufficient documentation

## 2013-01-10 DIAGNOSIS — R110 Nausea: Secondary | ICD-10-CM | POA: Insufficient documentation

## 2013-01-10 DIAGNOSIS — F1110 Opioid abuse, uncomplicated: Secondary | ICD-10-CM | POA: Insufficient documentation

## 2013-01-10 DIAGNOSIS — Z8742 Personal history of other diseases of the female genital tract: Secondary | ICD-10-CM | POA: Insufficient documentation

## 2013-01-10 DIAGNOSIS — Z8619 Personal history of other infectious and parasitic diseases: Secondary | ICD-10-CM | POA: Insufficient documentation

## 2013-01-10 DIAGNOSIS — Z3201 Encounter for pregnancy test, result positive: Secondary | ICD-10-CM | POA: Insufficient documentation

## 2013-01-10 DIAGNOSIS — Z3490 Encounter for supervision of normal pregnancy, unspecified, unspecified trimester: Secondary | ICD-10-CM

## 2013-01-10 DIAGNOSIS — J45909 Unspecified asthma, uncomplicated: Secondary | ICD-10-CM | POA: Insufficient documentation

## 2013-01-10 DIAGNOSIS — O9989 Other specified diseases and conditions complicating pregnancy, childbirth and the puerperium: Secondary | ICD-10-CM | POA: Insufficient documentation

## 2013-01-10 DIAGNOSIS — L03019 Cellulitis of unspecified finger: Secondary | ICD-10-CM | POA: Insufficient documentation

## 2013-01-10 LAB — CBC WITH DIFFERENTIAL/PLATELET
Basophils Absolute: 0 10*3/uL (ref 0.0–0.1)
HCT: 39 % (ref 36.0–46.0)
Hemoglobin: 12.9 g/dL (ref 12.0–15.0)
Lymphocytes Relative: 21 % (ref 12–46)
Lymphs Abs: 2.7 10*3/uL (ref 0.7–4.0)
MCV: 87.8 fL (ref 78.0–100.0)
Monocytes Absolute: 1.2 10*3/uL — ABNORMAL HIGH (ref 0.1–1.0)
Monocytes Relative: 9 % (ref 3–12)
Neutro Abs: 9 10*3/uL — ABNORMAL HIGH (ref 1.7–7.7)
RBC: 4.44 MIL/uL (ref 3.87–5.11)
RDW: 14.2 % (ref 11.5–15.5)
WBC: 13.1 10*3/uL — ABNORMAL HIGH (ref 4.0–10.5)

## 2013-01-10 LAB — COMPREHENSIVE METABOLIC PANEL
AST: 20 U/L (ref 0–37)
CO2: 25 mEq/L (ref 19–32)
Chloride: 100 mEq/L (ref 96–112)
Creatinine, Ser: 0.43 mg/dL — ABNORMAL LOW (ref 0.50–1.10)
GFR calc Af Amer: >90 mL/min (ref >90)
GFR calc non Af Amer: >90 mL/min (ref >90)
Glucose, Bld: 114 mg/dL — ABNORMAL HIGH (ref 70–99)
Total Bilirubin: 0.2 mg/dL — ABNORMAL LOW (ref 0.3–1.2)

## 2013-01-10 LAB — URINALYSIS, ROUTINE W REFLEX MICROSCOPIC
Glucose, UA: NEGATIVE mg/dL
Hgb urine dipstick: NEGATIVE
Ketones, ur: NEGATIVE mg/dL
Leukocytes, UA: NEGATIVE
Protein, ur: NEGATIVE mg/dL

## 2013-01-10 MED ORDER — ONDANSETRON HCL 4 MG/2ML IJ SOLN
4.0000 mg | Freq: Once | INTRAMUSCULAR | Status: AC
  Administered 2013-01-10: 4 mg via INTRAVENOUS
  Filled 2013-01-10: qty 2

## 2013-01-10 MED ORDER — SODIUM CHLORIDE 0.9 % IV BOLUS (SEPSIS)
1000.0000 mL | Freq: Once | INTRAVENOUS | Status: AC
  Administered 2013-01-10: 1000 mL via INTRAVENOUS

## 2013-01-10 MED ORDER — ONDANSETRON HCL 4 MG PO TABS: 4.0000 mg | ORAL_TABLET | Freq: Four times a day (QID) | ORAL | Status: DC

## 2013-01-10 MED ORDER — CEPHALEXIN 500 MG PO CAPS: 500.0000 mg | ORAL_CAPSULE | Freq: Four times a day (QID) | ORAL | Status: DC

## 2013-01-10 MED ORDER — PRENATAL COMPLETE 14-0.4 MG PO TABS: 1.0000 | ORAL_TABLET | Freq: Every day | ORAL | Status: DC

## 2013-01-10 NOTE — ED Notes (Signed)
Abdominal pain x 3 weeks, pt reporting is pregnant after taking pregnancy test in jail. Denies vaginal bleeding or discharge. States nausea, vomiting in morning. Redness to left middle nail. Reports drainage. A x 4. nad

## 2013-01-10 NOTE — ED Provider Notes (Signed)
History     CSN: [id number]  Arrival date & time 01/10/13  1447   First MD Initiated Contact with Patient 01/10/13 1449      No chief complaint on file.   (Consider location/radiation/quality/duration/timing/severity/associated sxs/prior treatment) The history is provided by the patient.   22 year old female has several complaints. She has been having sharp abdominal pains for several months, which is getting worse. The pain is only last a few seconds to a few minutes and can come anywhere in her abdomen. She has been having nausea but no vomiting. Denies constipation or diarrhea. There's no pain in her back, chest, shoulder. Nothing seems to make his pain better nothing seems to make it worse. She is worried that she might be pregnant. Last menstrual period was December 15. In addition to nausea, she has noticed urinary frequency and breast tenderness. She also has an infection in her left third finger. This is been present for several weeks. She did put a pin into it and it drained some purulent material. 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 She says that her pains, when present, rate is 7/10.  Past Medical History  Diagnosis Date  . Mononucleosis   . Ovarian cyst   . Chlamydia   . Bipolar 1 disorder   . Anxiety   . Asthma   . Heroin abuse     Past Surgical History  Procedure Laterality Date  . No past surgeries      No family history on file.  History  Substance Use Topics  . Smoking status: Current Every Day Smoker -- 1.00 packs/day for 5 years    Types: Cigarettes  . Smokeless tobacco: Not on file  . Alcohol Use: No    OB History   Grav Para Term Preterm Abortions TAB SAB Ect Mult Living   2    1  1          Review of Systems  All other systems reviewed and are negative.    Allergies  Hydrocodone; Advil; and Tramadol  Home Medications   Current Outpatient Rx  Name  Route  Sig  Dispense  Refill  . promethazine (PHENERGAN) 25 MG tablet   Oral   Take 1 tablet (25 mg total) by mouth every 6 (six) hours as needed for nausea.   10 tablet   0     BP 124/81  Pulse 91  Temp(Src) 98.3 F (36.8 C) (Oral)  Resp 16  Ht 5\' 4"  (1.626 m)  Wt 147 lb (66.679 kg)  BMI 25.22 kg/m2  SpO2 100%  LMP 11/17/2012  Physical Exam  Nursing note and vitals reviewed.  22 year old female, resting comfortably and in no acute distress. Vital signs are normal. Oxygen saturation is 100%, which is normal. Head is normocephalic and atraumatic. PERRLA, EOMI. Oropharynx is clear. Neck is nontender and supple without adenopathy or JVD. Back is nontender and there is no CVA tenderness. Lungs are clear without rales, wheezes, or rhonchi. Chest is nontender. Heart has regular rate and rhythm without murmur. Abdomen is soft, flat, nontender without masses or hepatosplenomegaly and peristalsis is normoactive. Extremities have no cyanosis or edema, full range of motion is present. There is erythema and very slight swelling in the paronychia with area of the left third finger. There is no fluctuance and this does not appear to be drainable at this time. Skin is warm and dry without rash. Neurologic: Mental status is normal, cranial nerves are intact, there are no  motor or sensory deficits.  ED Course  Procedures (including critical care time)  Results for orders placed during the hospital encounter of 01/10/13  CBC WITH DIFFERENTIAL      Result Value Range   WBC 13.1 (*) 4.0 - 10.5 K/uL   RBC 4.44  3.87 - 5.11 MIL/uL   Hemoglobin 12.9  12.0 - 15.0 g/dL   HCT 09.8  11.9 - 14.7 %   MCV 87.8  78.0 - 100.0 fL   MCH 29.1  26.0 - 34.0 pg   MCHC 33.1  30.0 - 36.0 g/dL   RDW 82.9  56.2 - 13.0 %   Platelets 272  150 - 400 K/uL   Neutrophils Relative 69  43 - 77 %   Neutro Abs 9.0 (*) 1.7 - 7.7 K/uL   Lymphocytes Relative 21  12 - 46 %   Lymphs Abs 2.7  0.7 - 4.0 K/uL   Monocytes Relative 9  3 - 12 %   Monocytes Absolute 1.2 (*) 0.1 - 1.0 K/uL   Eosinophils  Relative 1  0 - 5 %   Eosinophils Absolute 0.2  0.0 - 0.7 K/uL   Basophils Relative 0  0 - 1 %   Basophils Absolute 0.0  0.0 - 0.1 K/uL  COMPREHENSIVE METABOLIC PANEL      Result Value Range   Sodium 137  135 - 145 mEq/L   Potassium 3.4 (*) 3.5 - 5.1 mEq/L   Chloride 100  96 - 112 mEq/L   CO2 25  19 - 32 mEq/L   Glucose, Bld 114 (*) 70 - 99 mg/dL   BUN 7  6 - 23 mg/dL   Creatinine, Ser 8.65 (*) 0.50 - 1.10 mg/dL   Calcium 9.5  8.4 - 78.4 mg/dL   Total Protein 6.9  6.0 - 8.3 g/dL   Albumin 3.6  3.5 - 5.2 g/dL   AST 20  0 - 37 U/L   ALT 20  0 - 35 U/L   Alkaline Phosphatase 61  39 - 117 U/L   Total Bilirubin 0.2 (*) 0.3 - 1.2 mg/dL   GFR calc non Af Amer >90  >90 mL/min   GFR calc Af Amer >90  >90 mL/min  LIPASE, BLOOD      Result Value Range   Lipase 13  11 - 59 U/L  URINALYSIS, ROUTINE W REFLEX MICROSCOPIC      Result Value Range   Color, Urine YELLOW  YELLOW   APPearance CLOUDY (*) CLEAR   Specific Gravity, Urine 1.022  1.005 - 1.030   pH 7.5  5.0 - 8.0   Glucose, UA NEGATIVE  NEGATIVE mg/dL   Hgb urine dipstick NEGATIVE  NEGATIVE   Bilirubin Urine NEGATIVE  NEGATIVE   Ketones, ur NEGATIVE  NEGATIVE mg/dL   Protein, ur NEGATIVE  NEGATIVE mg/dL   Urobilinogen, UA 1.0  0.0 - 1.0 mg/dL   Nitrite NEGATIVE  NEGATIVE   Leukocytes, UA NEGATIVE  NEGATIVE  POCT PREGNANCY, URINE      Result Value Range   Preg Test, Ur POSITIVE (*) NEGATIVE   ECG shows normal sinus rhythm with a rate of 100, no ectopy. Normal axis. Normal P wave. Normal QRS. Normal intervals. Normal ST and T waves. Impression: normal ECG. When compared with ECG of 12/29/2012, no significant changes are seen.   1. Pregnancy   2. Nausea   3. Paronychia of third finger of left hand       MDM  Possible pregnancy. Pregnancy  test has been ordered. Abdominal pain which is most likely functional in nature. Her abdominal exam is completely benign. Screening labs have been ordered. Nausea is likely related to  pregnancy. Finger infection appears to be an early paronychia but not of the stage where an incision and drainage would be appropriate. This will be treated with warm soaks and oral antibiotics. Old records are reviewed and she did have incision and drainage of a paronychia in October 2013.  Pregnancy test is come back positive. She will be sent home with prescriptions for prenatal vitamins and ondansetron for nausea. She is given a prescription for cephalexin for her paronychia and is to return should there be a significant change indicating that had reached a point where incision and drainage would be appropriate. She is referred to the women's clinic for prenatal care.      Dione Booze, MD 01/10/13 1630

## 2013-01-17 ENCOUNTER — Emergency Department (HOSPITAL_COMMUNITY)
Admission: EM | Admit: 2013-01-17 | Discharge: 2013-01-17 | Discharge status: Against Medical Advice | Payer: Medicaid Other | Attending: Emergency Medicine | Admitting: Emergency Medicine

## 2013-01-17 ENCOUNTER — Encounter (HOSPITAL_COMMUNITY): Payer: Self-pay | Admitting: Cardiology

## 2013-01-17 DIAGNOSIS — O9989 Other specified diseases and conditions complicating pregnancy, childbirth and the puerperium: Secondary | ICD-10-CM | POA: Insufficient documentation

## 2013-01-17 DIAGNOSIS — R109 Unspecified abdominal pain: Secondary | ICD-10-CM | POA: Insufficient documentation

## 2013-01-17 NOTE — ED Notes (Signed)
Pt reports abd pain that is sharp in nature for the past 3 days. States she has noticed some vaginal discharge and has increased urination. States she is [redacted] weeks pregnant.

## 2013-01-17 NOTE — ED Notes (Signed)
Pt inquiring about the wait.  Wait time given.  D/cs in the back

## 2013-01-18 ENCOUNTER — Emergency Department (HOSPITAL_COMMUNITY): Payer: Medicaid Other

## 2013-01-18 ENCOUNTER — Emergency Department (HOSPITAL_COMMUNITY)
Admission: EM | Admit: 2013-01-18 | Discharge: 2013-01-18 | Disposition: A | Discharge status: Home / Self Care | Payer: Medicaid Other | Attending: Emergency Medicine | Admitting: Emergency Medicine

## 2013-01-18 ENCOUNTER — Encounter (HOSPITAL_COMMUNITY): Payer: Self-pay | Admitting: Emergency Medicine

## 2013-01-18 DIAGNOSIS — O2690 Pregnancy related conditions, unspecified, unspecified trimester: Secondary | ICD-10-CM | POA: Insufficient documentation

## 2013-01-18 DIAGNOSIS — F17200 Nicotine dependence, unspecified, uncomplicated: Secondary | ICD-10-CM | POA: Insufficient documentation

## 2013-01-18 DIAGNOSIS — R040 Epistaxis: Secondary | ICD-10-CM | POA: Insufficient documentation

## 2013-01-18 DIAGNOSIS — Z8742 Personal history of other diseases of the female genital tract: Secondary | ICD-10-CM | POA: Insufficient documentation

## 2013-01-18 DIAGNOSIS — Z8659 Personal history of other mental and behavioral disorders: Secondary | ICD-10-CM | POA: Insufficient documentation

## 2013-01-18 DIAGNOSIS — F1110 Opioid abuse, uncomplicated: Secondary | ICD-10-CM | POA: Insufficient documentation

## 2013-01-18 DIAGNOSIS — Z79899 Other long term (current) drug therapy: Secondary | ICD-10-CM | POA: Insufficient documentation

## 2013-01-18 DIAGNOSIS — Z8619 Personal history of other infectious and parasitic diseases: Secondary | ICD-10-CM | POA: Insufficient documentation

## 2013-01-18 DIAGNOSIS — Z3202 Encounter for pregnancy test, result negative: Secondary | ICD-10-CM | POA: Insufficient documentation

## 2013-01-18 DIAGNOSIS — O039 Complete or unspecified spontaneous abortion without complication: Secondary | ICD-10-CM | POA: Insufficient documentation

## 2013-01-18 DIAGNOSIS — J45909 Unspecified asthma, uncomplicated: Secondary | ICD-10-CM | POA: Insufficient documentation

## 2013-01-18 LAB — COMPREHENSIVE METABOLIC PANEL
Alkaline Phosphatase: 75 U/L (ref 39–117)
BUN: 7 mg/dL (ref 6–23)
Calcium: 9 mg/dL (ref 8.4–10.5)
GFR calc Af Amer: >90 mL/min (ref >90)
Glucose, Bld: 107 mg/dL — ABNORMAL HIGH (ref 70–99)
Potassium: 3.7 mEq/L (ref 3.5–5.1)
Total Protein: 7.1 g/dL (ref 6.0–8.3)

## 2013-01-18 LAB — HCG, QUANTITATIVE, PREGNANCY: hCG, Beta Chain, Quant, S: 14478 m[IU]/mL — ABNORMAL HIGH (ref <5)

## 2013-01-18 LAB — WET PREP, GENITAL: Clue Cells Wet Prep HPF POC: NONE SEEN

## 2013-01-18 LAB — URINALYSIS, ROUTINE W REFLEX MICROSCOPIC
Bilirubin Urine: NEGATIVE
Hgb urine dipstick: NEGATIVE
Specific Gravity, Urine: 1.021 (ref 1.005–1.030)
pH: 6 (ref 5.0–8.0)

## 2013-01-18 LAB — CBC WITH DIFFERENTIAL/PLATELET
Eosinophils Absolute: 0.4 10*3/uL (ref 0.0–0.7)
Eosinophils Relative: 4 % (ref 0–5)
Hemoglobin: 12.6 g/dL (ref 12.0–15.0)
Lymphs Abs: 2.6 10*3/uL (ref 0.7–4.0)
MCH: 29.9 pg (ref 26.0–34.0)
MCV: 88.6 fL (ref 78.0–100.0)
Monocytes Relative: 10 % (ref 3–12)
RBC: 4.22 MIL/uL (ref 3.87–5.11)

## 2013-01-18 LAB — POCT PREGNANCY, URINE: Preg Test, Ur: POSITIVE — AB

## 2013-01-18 MED ORDER — SODIUM CHLORIDE 0.9 % IV BOLUS (SEPSIS)
1000.0000 mL | Freq: Once | INTRAVENOUS | Status: AC
  Administered 2013-01-18: 1000 mL via INTRAVENOUS

## 2013-01-18 MED ORDER — ACETAMINOPHEN 500 MG PO TABS
1000.0000 mg | ORAL_TABLET | Freq: Once | ORAL | Status: DC
  Filled 2013-01-18: qty 2

## 2013-01-18 MED ORDER — METOCLOPRAMIDE HCL 5 MG/ML IJ SOLN
10.0000 mg | Freq: Once | INTRAMUSCULAR | Status: AC
  Administered 2013-01-18: 10 mg via INTRAVENOUS
  Filled 2013-01-18: qty 2

## 2013-01-18 NOTE — ED Notes (Signed)
Pt walked out of ER upset due to the MD recommendations. Pt removed IV herself. Pt did not received discharge instructions due to patient left the building

## 2013-01-18 NOTE — ED Provider Notes (Signed)
History     CSN: [id number]  Arrival date & time 01/18/13  1315   First MD Initiated Contact with Patient 01/18/13 1319      Chief Complaint  Patient presents with  . Abdominal Pain    (Consider location/radiation/quality/duration/timing/severity/associated sxs/prior treatment) The history is provided by the patient.  Meghan Bullock is a 22 y.o. female [redacted] weeks pregnant here presenting with vaginal discharge and nosebleeds. Her LMP was December 21. She has intermittent cramps for the last several weeks. She also now has some clear vaginal discharge but denies any bleeding. She was recently incarcerated and has been having intermittent nosebleeds due to dry air. She also feels alone nauseous but he has been eating well.   Past Medical History  Diagnosis Date  . Mononucleosis   . Ovarian cyst   . Chlamydia   . Bipolar 1 disorder   . Anxiety   . Asthma   . Heroin abuse     Past Surgical History  Procedure Laterality Date  . No past surgeries      History reviewed. No pertinent family history.  History  Substance Use Topics  . Smoking status: Current Every Day Smoker -- 1.00 packs/day for 5 years    Types: Cigarettes  . Smokeless tobacco: Not on file  . Alcohol Use: No    OB History   Grav Para Term Preterm Abortions TAB SAB Ect Mult Living   2    1  1          Review of Systems  HENT:       Nose bleed   Gastrointestinal: Positive for abdominal pain.  All other systems reviewed and are negative.    Allergies  Hydrocodone; Advil; and Tramadol  Home Medications   Current Outpatient Rx  Name  Route  Sig  Dispense  Refill  . cephALEXin (KEFLEX) 500 MG capsule   Oral   Take 500 mg by mouth daily. For 10 days Started 2/13         . Prenatal Vit-Fe Fumarate-FA (PRENATAL COMPLETE) 14-0.4 MG TABS   Oral   Take 1 tablet by mouth daily.   30 each   0     BP 121/80  Pulse 103  Temp(Src) 97.9 F (36.6 C) (Oral)  Resp 20  SpO2 100%  LMP  11/17/2012  Physical Exam  Nursing note and vitals reviewed. Constitutional: She is oriented to person, place, and time. She appears well-developed and well-nourished.  HENT:  Head: Normocephalic.  Mouth/Throat: Oropharynx is clear and moist.  L nose with dry blood, no active bleeding   Eyes: Conjunctivae are normal. Pupils are equal, round, and reactive to light.  Neck: Normal range of motion. Neck supple.  Cardiovascular: Normal rate, regular rhythm and normal heart sounds.   Pulmonary/Chest: Effort normal and breath sounds normal. No respiratory distress. She has no wheezes. She has no rales.  Abdominal: Soft. Bowel sounds are normal.  Gravid uterus, nontender   Genitourinary:  Whitish discharge, not foul smelling. No bleeding. Os closed. No uterine or adnexal tenderness.   Musculoskeletal: Normal range of motion.  L 3rd finger small paronychia.   Neurological: She is alert and oriented to person, place, and time.  Skin: Skin is warm and dry.  Psychiatric: She has a normal mood and affect. Her behavior is normal. Judgment and thought content normal.    ED Course  Procedures (including critical care time)  Labs Reviewed  WET PREP, GENITAL - Abnormal; Notable for the following:  WBC, Wet Prep HPF POC FEW (*)    All other components within normal limits  URINALYSIS, ROUTINE W REFLEX MICROSCOPIC - Abnormal; Notable for the following:    APPearance CLOUDY (*)    All other components within normal limits  COMPREHENSIVE METABOLIC PANEL - Abnormal; Notable for the following:    Glucose, Bld 107 (*)    Total Bilirubin 0.2 (*)    All other components within normal limits  HCG, QUANTITATIVE, PREGNANCY - Abnormal; Notable for the following:    hCG, Beta Chain, Quant, S 14478 (*)    All other components within normal limits  POCT PREGNANCY, URINE - Abnormal; Notable for the following:    Preg Test, Ur POSITIVE (*)    All other components within normal limits  GC/CHLAMYDIA PROBE  AMP  CBC WITH DIFFERENTIAL   US Ob Comp Less 14 Wks  01/18/2013  *RADIOLOGY REPORT*  Clinical Data: Right lower quadrant pain  OBSTETRIC <14 WK [place] AND TRANSVAGINAL OB US  Technique:  Both transabdominal and transvaginal ultrasound examinations were performed for complete evaluation of the gestation as well as the maternal uterus, adnexal regions, and pelvic cul-de-sac.  Transvaginal technique was performed to assess early pregnancy.  Comparison:  None.  Intrauterine gestational sac:  Visualized/normal in shape. Yolk sac: Seen Embryo: Seen Cardiac Activity: Not seen Heart Rate: Not applicable bpm  CRL: 7.8  mm  6 w  5 d        [place] EDC: 09/06/2013  Maternal uterus/adnexae: Both ovaries appear normal with the left ovary measuring 3.1 x 2.3 x 2.5 cm and the right ovary measuring 2.1 x 2.1 x 3.3 cm.  No pelvic fluid is seen.  IMPRESSION: Single intrauterine pregnancy demonstrating an estimated gestational age by crown-rump length of 6 weeks 5 days.  No fetal cardiac activity is seen and should be seen with confidence at today's crown-rump length of 7.8 mm.  Findings are confirmatory for pregnancy failure.  Normal ovaries   Original Report Authenticated By: Rhodia Albright, M.D.    US Ob Transvaginal  01/18/2013  *RADIOLOGY REPORT*  Clinical Data: Right lower quadrant pain  OBSTETRIC <14 WK [place] AND TRANSVAGINAL OB US  Technique:  Both transabdominal and transvaginal ultrasound examinations were performed for complete evaluation of the gestation as well as the maternal uterus, adnexal regions, and pelvic cul-de-sac.  Transvaginal technique was performed to assess early pregnancy.  Comparison:  None.  Intrauterine gestational sac:  Visualized/normal in shape. Yolk sac: Seen Embryo: Seen Cardiac Activity: Not seen Heart Rate: Not applicable bpm  CRL: 7.8  mm  6 w  5 d        [place] EDC: 09/06/2013  Maternal uterus/adnexae: Both ovaries appear normal with the left ovary measuring 3.1 x 2.3 x 2.5 cm and the right ovary  measuring 2.1 x 2.1 x 3.3 cm.  No pelvic fluid is seen.  IMPRESSION: Single intrauterine pregnancy demonstrating an estimated gestational age by crown-rump length of 6 weeks 5 days.  No fetal cardiac activity is seen and should be seen with confidence at today's crown-rump length of 7.8 mm.  Findings are confirmatory for pregnancy failure.  Normal ovaries   Original Report Authenticated By: Rhodia Albright, M.D.      No diagnosis found.    MDM  NIKISHA FLEECE is a 22 y.o. female here with nausea, likely hyperemesis. Hasn't have prenatal care. Given vaginal discharge, will need to get [place] to confirm IUP. Will get labs and HCG level as well. Will  give reglan and IVF for hyperemesis.   4:02 PM US showed 6 week fetus with no heart rate. Possible pregnancy failure. I told patient that she may have a spontaneous abortion or it may be little too early to tell the heart rate. She has L 3rd digit paronychia but she doesn't want it drained. She will f/u with women's clinic next week and continue warm soaks for the paronychia. Return precautions given.        Richardean Canal, MD 01/18/13 [phone]

## 2013-01-18 NOTE — ED Notes (Signed)
Pt sts hx of generalized abd pain intermittently x 1 week; pt sts 8-[redacted] weeks pregnant and sts some clear vaginal discharge; pt sts some nausea; pt sts hx of one previous miscarriage; pt LMP 11/17/12

## 2013-01-18 NOTE — ED Notes (Signed)
Patient transported to Ultrasound 

## 2013-01-28 ENCOUNTER — Encounter (HOSPITAL_COMMUNITY): Payer: Self-pay | Admitting: *Deleted

## 2013-01-28 ENCOUNTER — Emergency Department (HOSPITAL_COMMUNITY)
Admission: EM | Admit: 2013-01-28 | Discharge: 2013-01-28 | Disposition: A | Discharge status: Home / Self Care | Payer: Medicaid Other | Attending: Emergency Medicine | Admitting: Emergency Medicine

## 2013-01-28 DIAGNOSIS — F17200 Nicotine dependence, unspecified, uncomplicated: Secondary | ICD-10-CM | POA: Insufficient documentation

## 2013-01-28 DIAGNOSIS — J45909 Unspecified asthma, uncomplicated: Secondary | ICD-10-CM | POA: Insufficient documentation

## 2013-01-28 DIAGNOSIS — O200 Threatened abortion: Secondary | ICD-10-CM

## 2013-01-28 DIAGNOSIS — Z8619 Personal history of other infectious and parasitic diseases: Secondary | ICD-10-CM | POA: Insufficient documentation

## 2013-01-28 DIAGNOSIS — O9989 Other specified diseases and conditions complicating pregnancy, childbirth and the puerperium: Secondary | ICD-10-CM | POA: Insufficient documentation

## 2013-01-28 DIAGNOSIS — R109 Unspecified abdominal pain: Secondary | ICD-10-CM | POA: Insufficient documentation

## 2013-01-28 DIAGNOSIS — Z8659 Personal history of other mental and behavioral disorders: Secondary | ICD-10-CM | POA: Insufficient documentation

## 2013-01-28 DIAGNOSIS — Z8742 Personal history of other diseases of the female genital tract: Secondary | ICD-10-CM | POA: Insufficient documentation

## 2013-01-28 DIAGNOSIS — F1110 Opioid abuse, uncomplicated: Secondary | ICD-10-CM | POA: Insufficient documentation

## 2013-01-28 LAB — URINE MICROSCOPIC-ADD ON

## 2013-01-28 LAB — ABO/RH

## 2013-01-28 LAB — URINALYSIS, ROUTINE W REFLEX MICROSCOPIC
Protein, ur: 100 mg/dL — AB
Urobilinogen, UA: 1 mg/dL (ref 0.0–1.0)

## 2013-01-28 LAB — COMPREHENSIVE METABOLIC PANEL
AST: 26 U/L (ref 0–37)
Albumin: 3.7 g/dL (ref 3.5–5.2)
Calcium: 9.2 mg/dL (ref 8.4–10.5)
Creatinine, Ser: 0.6 mg/dL (ref 0.50–1.10)

## 2013-01-28 LAB — CBC WITH DIFFERENTIAL/PLATELET
Basophils Absolute: 0 10*3/uL (ref 0.0–0.1)
Basophils Relative: 1 % (ref 0–1)
Eosinophils Absolute: 0.3 10*3/uL (ref 0.0–0.7)
Eosinophils Relative: 4 % (ref 0–5)
HCT: 37 % (ref 36.0–46.0)
MCHC: 34.1 g/dL (ref 30.0–36.0)
MCV: 87.1 fL (ref 78.0–100.0)
Monocytes Absolute: 0.9 10*3/uL (ref 0.1–1.0)
Neutro Abs: 4.1 10*3/uL (ref 1.7–7.7)
RDW: 13.9 % (ref 11.5–15.5)

## 2013-01-28 LAB — HCG, QUANTITATIVE, PREGNANCY: hCG, Beta Chain, Quant, S: 9042 m[IU]/mL — ABNORMAL HIGH (ref <5)

## 2013-01-28 LAB — PREGNANCY, URINE: Preg Test, Ur: POSITIVE — AB

## 2013-01-28 MED ORDER — MORPHINE SULFATE 4 MG/ML IJ SOLN
4.0000 mg | Freq: Once | INTRAMUSCULAR | Status: DC
  Filled 2013-01-28: qty 1

## 2013-01-28 MED ORDER — SODIUM CHLORIDE 0.9 % IV SOLN: Freq: Once | INTRAVENOUS | Status: DC

## 2013-01-28 MED ORDER — ONDANSETRON HCL 4 MG/2ML IJ SOLN
4.0000 mg | Freq: Once | INTRAMUSCULAR | Status: DC
  Filled 2013-01-28: qty 2

## 2013-01-28 NOTE — ED Notes (Signed)
Pt not in room, gown on bed.   

## 2013-01-28 NOTE — ED Notes (Signed)
Pt states that she thinks she is having a miscarriage.  Pt states that her LMP was 12/21 and found out she was pregnant a month ago.  Pt does not have a OBGYN and has not had prenatal care yet.  Pt states that she began having vaginal bleeding and cramping this am.

## 2013-01-28 NOTE — ED Notes (Addendum)
Pt not in room x 2. ED PA informed & aware

## 2013-01-28 NOTE — ED Provider Notes (Signed)
Medical screening examination/treatment/procedure(s) were performed by non-physician practitioner and as supervising physician I was immediately available for consultation/collaboration.   Richardean Canal, MD 01/28/13 [phone]

## 2013-01-28 NOTE — ED Notes (Signed)
LNMP 11/17/12, was given EDD 08/24/13. Awoke this morning started vaginal bleeding & menstrual cramping. States was to have first OB/GYN appt today at 1045.

## 2013-01-28 NOTE — ED Provider Notes (Signed)
History     CSN: [id number]  Arrival date & time 01/28/13  1042   First MD Initiated Contact with Patient 01/28/13 1113      Chief Complaint  Patient presents with  . Vaginal Bleeding    (Consider location/radiation/quality/duration/timing/severity/associated sxs/prior treatment) HPI Patient presents to the emergency department with lower abdominal cramping and vaginal bleeding since early this morning. patient states she feels, like she's having a miscarriage. patient states, that she's had one previous miscarriage in the past. Patient denies nausea, vomiting, headache, visual changes, fever, chest pain, or shortness of breath.  Patient, states, that she did not take anything for her symptoms prior to arrival.  Palpation makes her pain, worse. Past Medical History  Diagnosis Date  . Mononucleosis   . Ovarian cyst   . Chlamydia   . Bipolar 1 disorder   . Anxiety   . Asthma   . Heroin abuse     Past Surgical History  Procedure Laterality Date  . No past surgeries      No family history on file.  History  Substance Use Topics  . Smoking status: Current Every Day Smoker -- 1.00 packs/day for 5 years    Types: Cigarettes  . Smokeless tobacco: Not on file  . Alcohol Use: No    OB History   Grav Para Term Preterm Abortions TAB SAB Ect Mult Living   2    1  1          Review of Systems All other systems negative except as documented in the HPI. All pertinent positives and negatives as reviewed in the HPI.  Allergies  Hydrocodone; Advil; and Tramadol  Home Medications   Current Outpatient Rx  Name  Route  Sig  Dispense  Refill  . Prenatal Vit-Fe Fumarate-FA (PRENATAL COMPLETE) 14-0.4 MG TABS   Oral   Take 1 tablet by mouth daily.   30 each   0     BP 131/79  Pulse 96  Temp(Src) 98.2 F (36.8 C) (Oral)  Resp 18  SpO2 97%  LMP 11/17/2012  Physical Exam  Nursing note and vitals reviewed. Constitutional: She appears well-developed and well-nourished. No  distress.  HENT:  Head: Normocephalic and atraumatic.  Mouth/Throat: Oropharynx is clear and moist.  Eyes: Pupils are equal, round, and reactive to light.  Cardiovascular: Normal rate and regular rhythm.  Exam reveals no gallop and no friction rub.   No murmur heard. Pulmonary/Chest: Effort normal and breath sounds normal.  Abdominal: Soft. Bowel sounds are normal. There is tenderness. There is no rigidity, no rebound and no guarding.    Skin: Skin is warm and dry. No erythema.    ED Course  Procedures (including critical care time)  Labs Reviewed  URINALYSIS, ROUTINE W REFLEX MICROSCOPIC - Abnormal; Notable for the following:    Color, Urine RED (*)    APPearance TURBID (*)    Specific Gravity, Urine 1.036 (*)    Hgb urine dipstick LARGE (*)    Bilirubin Urine MODERATE (*)    Ketones, ur 15 (*)    Protein, ur 100 (*)    Nitrite POSITIVE (*)    Leukocytes, UA MODERATE (*)    All other components within normal limits  PREGNANCY, URINE - Abnormal; Notable for the following:    Preg Test, Ur POSITIVE (*)    All other components within normal limits  CBC WITH DIFFERENTIAL - Abnormal; Notable for the following:    Monocytes Relative 13 (*)    All  other components within normal limits  COMPREHENSIVE METABOLIC PANEL - Abnormal; Notable for the following:    Potassium 3.4 (*)    Glucose, Bld 126 (*)    BUN 4 (*)    All other components within normal limits  HCG, QUANTITATIVE, PREGNANCY - Abnormal; Notable for the following:    hCG, Beta Chain, Quant, S 9042 (*)    All other components within normal limits  URINE MICROSCOPIC-ADD ON - Abnormal; Notable for the following:    Squamous Epithelial / LPF MANY (*)    Bacteria, UA MANY (*)    All other components within normal limits  WET PREP, GENITAL  GC/CHLAMYDIA PROBE AMP  URINE CULTURE  ABO/RH   The patient, apparently left without telling the staff.  The nurse just informed me at 2:05 PM.  The patient is not in the room and  there cannot find her.  Patient is awaiting ultrasound and further care.  Here in the emergency department.  Patient did have a fair amount of bruising, and small wounds to her arms, my concern was that she may be using some sort of intravenous drug.  Patient left without informing staff, therefore, not able to complete her evaluation.   MDM          Carlyle Dolly, PA-C 01/28/13 1407

## 2013-01-29 LAB — URINE CULTURE: Colony Count: 45000

## 2013-02-07 ENCOUNTER — Emergency Department (HOSPITAL_COMMUNITY)
Admission: EM | Admit: 2013-02-07 | Discharge: 2013-02-07 | Disposition: A | Discharge status: Home / Self Care | Payer: Medicaid Other | Attending: Emergency Medicine | Admitting: Emergency Medicine

## 2013-02-07 DIAGNOSIS — M549 Dorsalgia, unspecified: Secondary | ICD-10-CM | POA: Insufficient documentation

## 2013-02-07 DIAGNOSIS — J45909 Unspecified asthma, uncomplicated: Secondary | ICD-10-CM | POA: Insufficient documentation

## 2013-02-07 DIAGNOSIS — Z8659 Personal history of other mental and behavioral disorders: Secondary | ICD-10-CM | POA: Insufficient documentation

## 2013-02-07 DIAGNOSIS — Z8619 Personal history of other infectious and parasitic diseases: Secondary | ICD-10-CM | POA: Insufficient documentation

## 2013-02-07 DIAGNOSIS — O209 Hemorrhage in early pregnancy, unspecified: Secondary | ICD-10-CM

## 2013-02-07 DIAGNOSIS — O99330 Smoking (tobacco) complicating pregnancy, unspecified trimester: Secondary | ICD-10-CM | POA: Insufficient documentation

## 2013-02-07 DIAGNOSIS — Z8742 Personal history of other diseases of the female genital tract: Secondary | ICD-10-CM | POA: Insufficient documentation

## 2013-02-07 DIAGNOSIS — F1110 Opioid abuse, uncomplicated: Secondary | ICD-10-CM | POA: Insufficient documentation

## 2013-02-07 DIAGNOSIS — R109 Unspecified abdominal pain: Secondary | ICD-10-CM | POA: Insufficient documentation

## 2013-02-07 DIAGNOSIS — O2390 Unspecified genitourinary tract infection in pregnancy, unspecified trimester: Secondary | ICD-10-CM | POA: Insufficient documentation

## 2013-02-07 DIAGNOSIS — O9989 Other specified diseases and conditions complicating pregnancy, childbirth and the puerperium: Secondary | ICD-10-CM | POA: Insufficient documentation

## 2013-02-07 LAB — BASIC METABOLIC PANEL
BUN: 6 mg/dL (ref 6–23)
Chloride: 100 mEq/L (ref 96–112)
GFR calc Af Amer: >90 mL/min (ref >90)
GFR calc non Af Amer: >90 mL/min (ref >90)
Potassium: 3.8 mEq/L (ref 3.5–5.1)
Sodium: 135 mEq/L (ref 135–145)

## 2013-02-07 LAB — URINE MICROSCOPIC-ADD ON

## 2013-02-07 LAB — URINALYSIS, ROUTINE W REFLEX MICROSCOPIC
Nitrite: NEGATIVE
Protein, ur: NEGATIVE mg/dL
Specific Gravity, Urine: 1.018 (ref 1.005–1.030)
Urobilinogen, UA: 0.2 mg/dL (ref 0.0–1.0)

## 2013-02-07 LAB — CBC WITH DIFFERENTIAL/PLATELET
Basophils Relative: 1 % (ref 0–1)
Hemoglobin: 12.4 g/dL (ref 12.0–15.0)
MCHC: 32 g/dL (ref 30.0–36.0)
Monocytes Relative: 12 % (ref 3–12)
Neutro Abs: 3.5 10*3/uL (ref 1.7–7.7)
Neutrophils Relative %: 51 % (ref 43–77)
Platelets: 276 10*3/uL (ref 150–400)
RBC: 4.49 MIL/uL (ref 3.87–5.11)

## 2013-02-07 LAB — RAPID URINE DRUG SCREEN, HOSP PERFORMED
Cocaine: NOT DETECTED
Opiates: POSITIVE — AB

## 2013-02-07 LAB — HCG, QUANTITATIVE, PREGNANCY: hCG, Beta Chain, Quant, S: 159 m[IU]/mL — ABNORMAL HIGH (ref <5)

## 2013-02-07 MED ORDER — OXYCODONE-ACETAMINOPHEN 5-325 MG PO TABS
1.0000 | ORAL_TABLET | Freq: Once | ORAL | Status: AC
  Administered 2013-02-07: 1 via ORAL
  Filled 2013-02-07: qty 1

## 2013-02-07 NOTE — ED Provider Notes (Signed)
History     CSN: [id number]  Arrival date & time 02/07/13  1118   First MD Initiated Contact with Patient 02/07/13 1141      Chief Complaint  Patient presents with  . Vaginal Bleeding     Patient is a 22 y.o. female presenting with vaginal bleeding. The history is provided by the patient.  Vaginal Bleeding This is a recurrent problem. The current episode started more than 2 days ago. The problem occurs daily. The problem has been gradually worsening. Associated symptoms include abdominal pain. Nothing aggravates the symptoms. Nothing relieves the symptoms. She has tried rest for the symptoms. The treatment provided mild relief.  pt reports she knows she is pregnant and feel she may be having miscarriage She reports abdominal cramping She reports this is second pregnancy and her first pregnancy was also miscarriage and feels similar She reports mild back pain No focal weakness.  No dysuria.    Past Medical History  Diagnosis Date  . Mononucleosis   . Ovarian cyst   . Chlamydia   . Bipolar 1 disorder   . Anxiety   . Asthma   . Heroin abuse     Past Surgical History  Procedure Laterality Date  . No past surgeries      No family history on file.  History  Substance Use Topics  . Smoking status: Current Every Day Smoker -- 1.00 packs/day for 5 years    Types: Cigarettes  . Smokeless tobacco: Not on file  . Alcohol Use: No    OB History   Grav Para Term Preterm Abortions TAB SAB Ect Mult Living   2    1  1          Review of Systems  Gastrointestinal: Positive for abdominal pain.  Genitourinary: Positive for vaginal bleeding.    Allergies  Hydrocodone; Advil; and Tramadol  Home Medications  No current outpatient prescriptions on file.  BP 120/76  Pulse 72  Temp(Src) 98.3 F (36.8 C) (Oral)  Resp 20  SpO2 99%  LMP 11/17/2012  Physical Exam CONSTITUTIONAL: Well developed/well nourished HEAD: Normocephalic/atraumatic EYES: EOMI/PERRL ENMT: Mucous  membranes moist NECK: supple no meningeal signs CV: S1/S2 noted, no murmurs/rubs/gallops noted LUNGS: Lungs are clear to auscultation bilaterally, no apparent distress ABDOMEN: soft, nontender, no rebound or guarding GU:no cva tenderness. Small amt of blood in vaginal vault.  No active bleeding.  No products of conception.  Os is closed.  No cmt. No adnexal tenderness/mass.  Chaperone present NEURO: Pt is awake/alert, moves all extremitiesx4 EXTREMITIES: pulses normal, full ROM SKIN: warm, color normal PSYCH: anxious  ED Course  Procedures  Labs Reviewed  CBC WITH DIFFERENTIAL - Abnormal; Notable for the following:    Eosinophils Relative 7 (*)    All other components within normal limits  HCG, QUANTITATIVE, PREGNANCY - Abnormal; Notable for the following:    hCG, Beta Chain, Quant, S 159 (*)    All other components within normal limits  URINALYSIS, ROUTINE W REFLEX MICROSCOPIC - Abnormal; Notable for the following:    APPearance CLOUDY (*)    Hgb urine dipstick SMALL (*)    Leukocytes, UA SMALL (*)    All other components within normal limits  URINE RAPID DRUG SCREEN (HOSP PERFORMED) - Abnormal; Notable for the following:    Opiates POSITIVE (*)    Benzodiazepines POSITIVE (*)    Tetrahydrocannabinol POSITIVE (*)    All other components within normal limits  URINE MICROSCOPIC-ADD ON - Abnormal; Notable for the  following:    Squamous Epithelial / LPF FEW (*)    All other components within normal limits  BASIC METABOLIC PANEL   No results found.   1. Vaginal bleeding in pregnancy, first trimester       MDM  Nursing notes including past medical history and social history reviewed and considered in documentation Labs/vital reviewed and considered  Pt is RH+ Previous ultrasound showed IUP Quant is trending down suspect miscarriage Doubt ectopic.  Pt appears stable.  Advised close f/u with GYN.  Do not feel admission/further testing necessary.  I doubt acute abdominal  process.        Joya Gaskins, MD 02/07/13 [phone]

## 2013-02-07 NOTE — ED Notes (Signed)
Pt states that she is having a miss carriage that she took a preg test in jail and was postive and that she has been bleeding for 6 days intermit heavy then light. Some vomiting not dizzy. lmp dec 21.

## 2013-02-20 ENCOUNTER — Encounter: Payer: Medicaid Other | Admitting: Obstetrics and Gynecology

## 2013-03-16 IMAGING — US US OB COMP LESS 14 WK
1 series · 14 of 28 positions shown · non-contrast
Comparison: None

CLINICAL DATA: Pregnant, abdominal pain; quantitative beta HCG 6646

OBSTETRIC <14 WK US AND TRANSVAGINAL OB US
TECHNIQUE: Both transabdominal and transvaginal ultrasound
examinations were performed for complete evaluation of the
gestation as well as the maternal uterus, adnexal regions, and
pelvic cul-de-sac.  Transvaginal technique was performed to assess
early pregnancy.

[Series 1: us ob comp less 14 wk · 0.26mm/px · 14 of 38 slices shown]
[im 2/38]
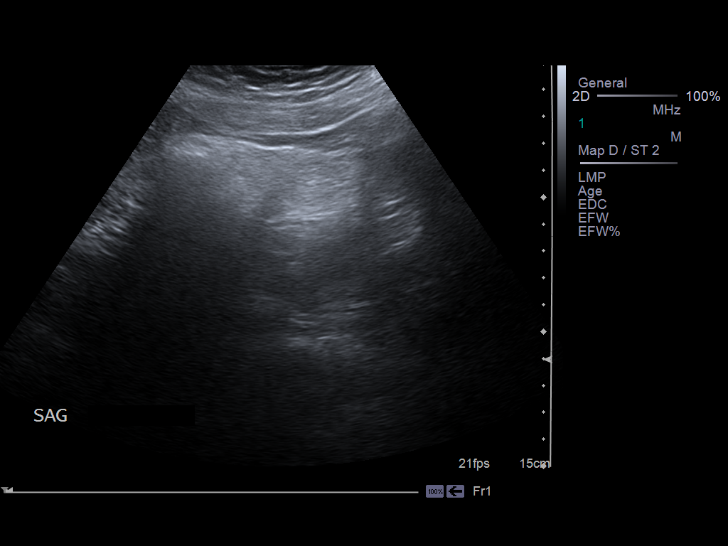
[im 5/38]
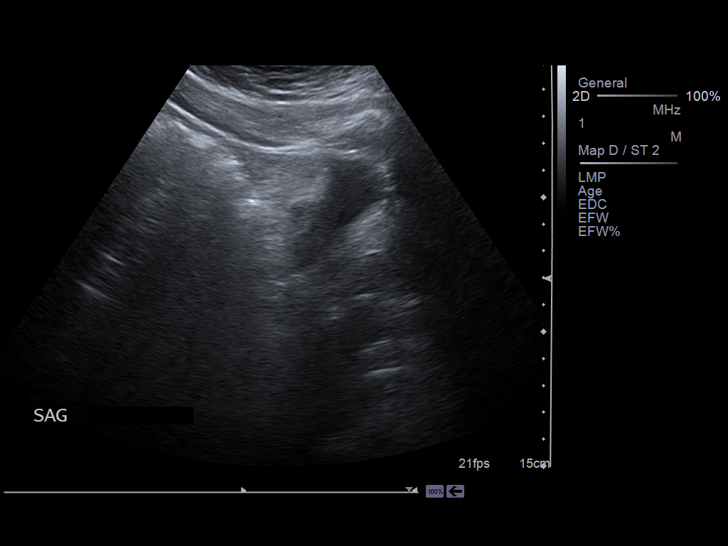
[im 7/38]
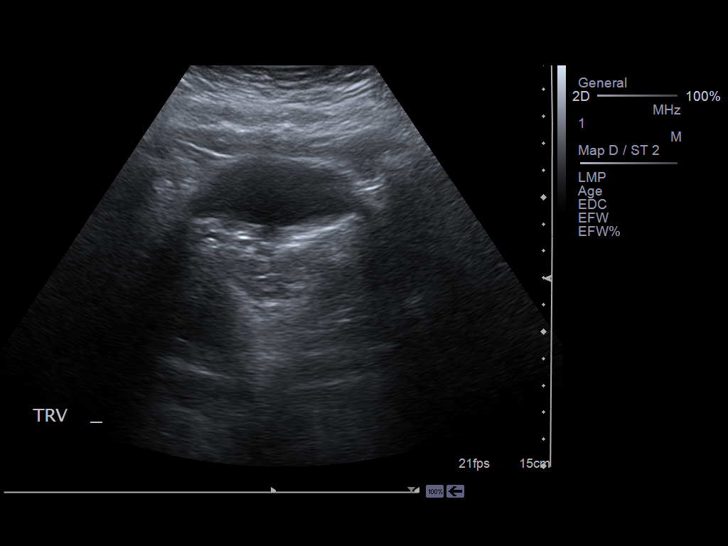
[im 10/38]
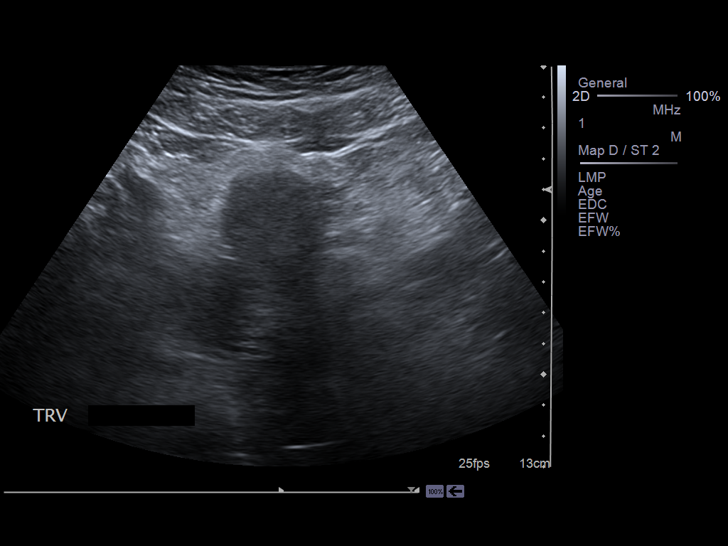
[im 13/38]
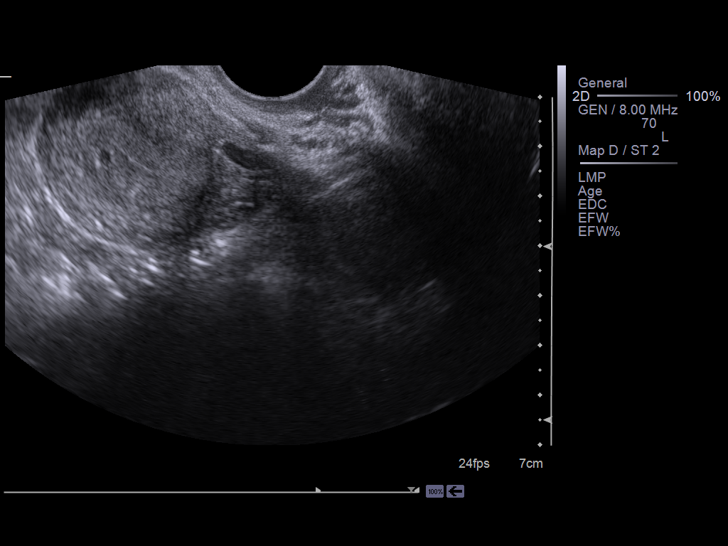
[im 16/38]
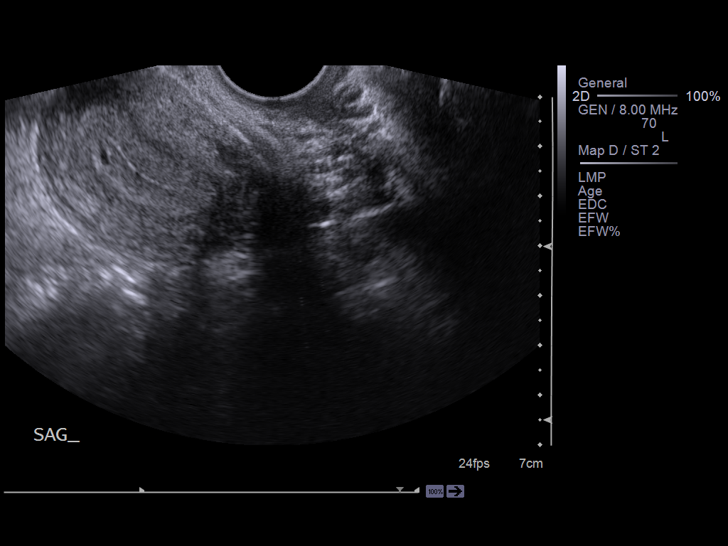
[im 18/38]
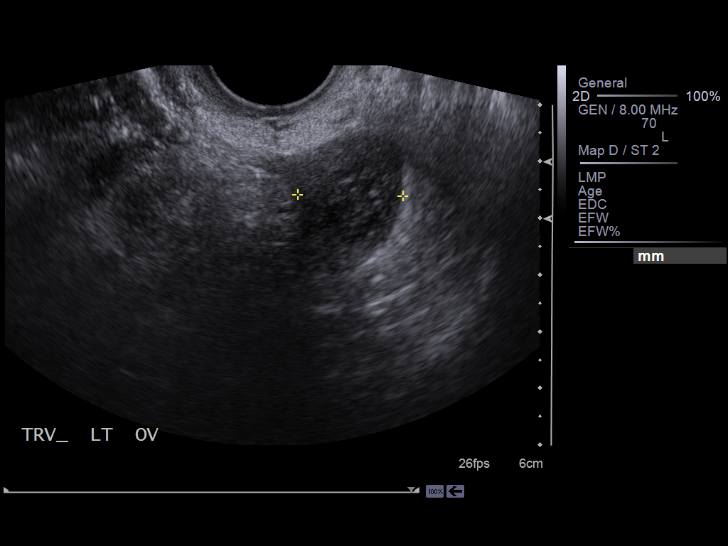
[im 21/38]
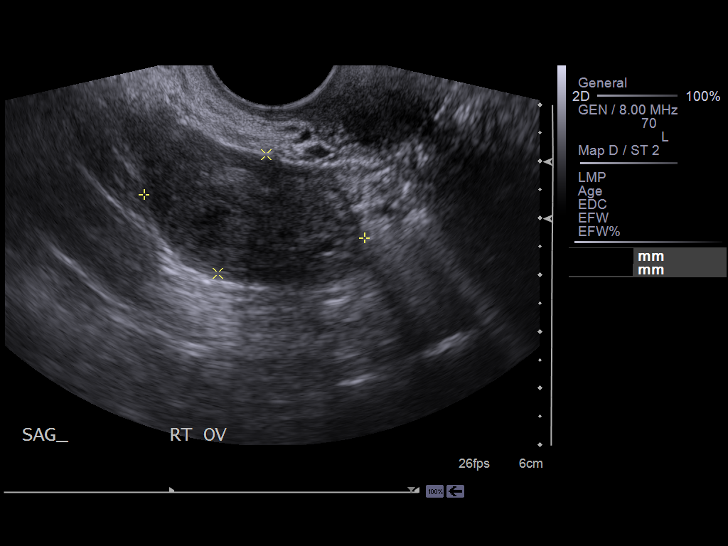
[im 24/38]
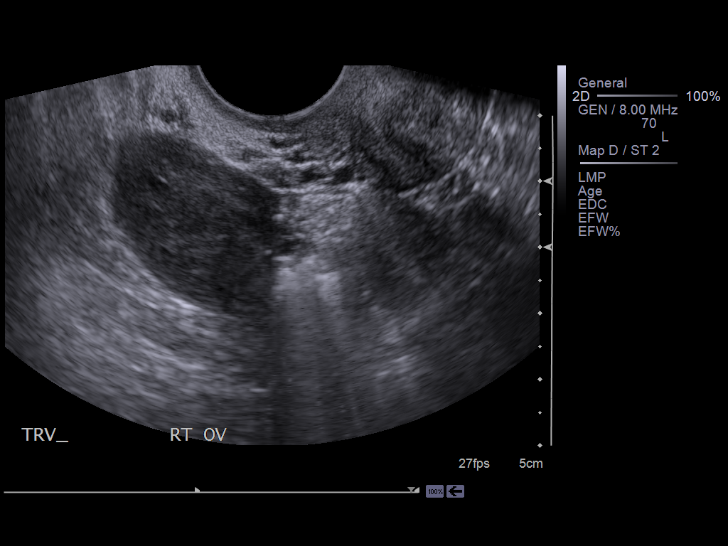
[im 27/38]
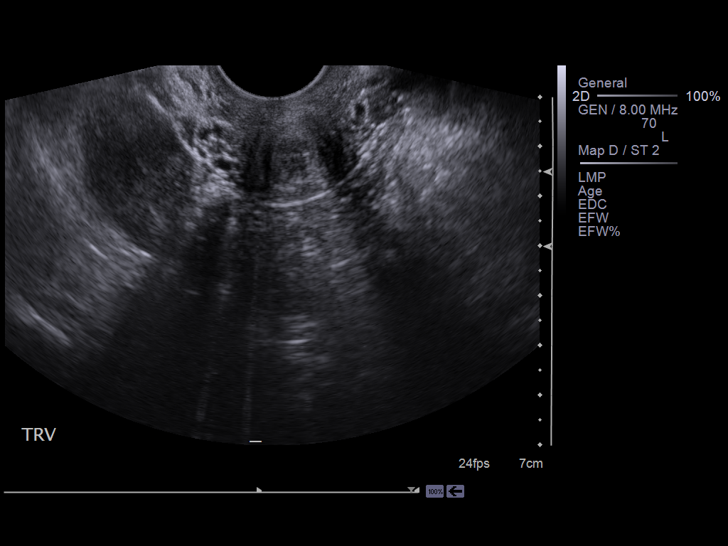
[im 29/38]
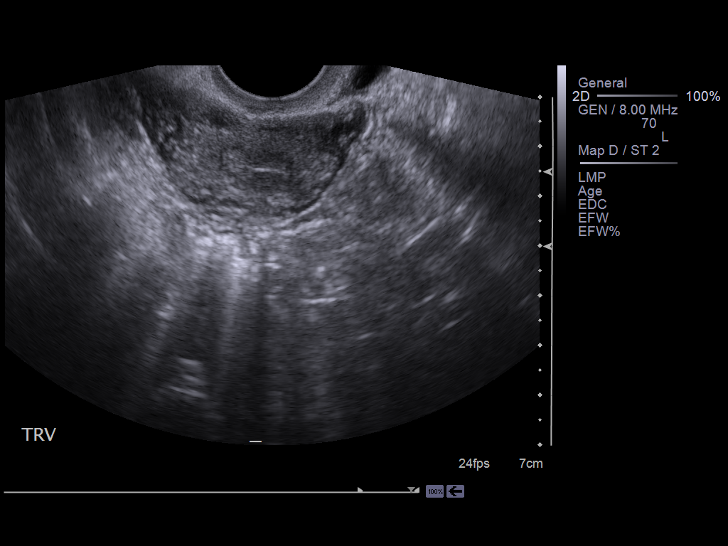
[im 32/38]
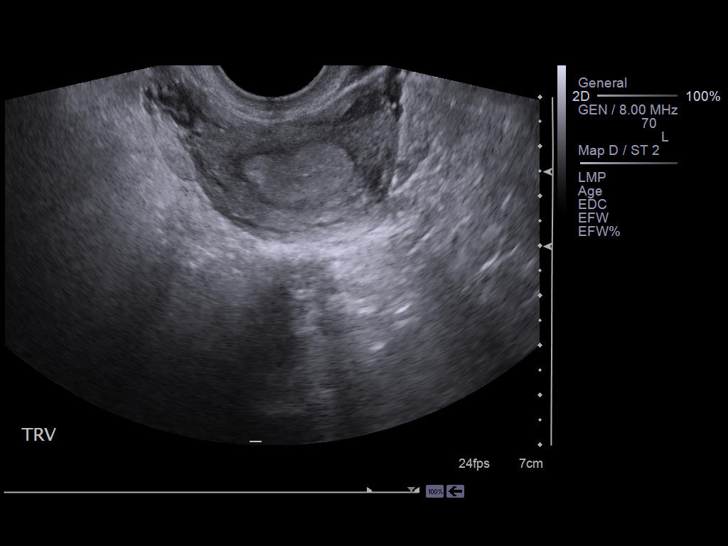
[im 35/38]
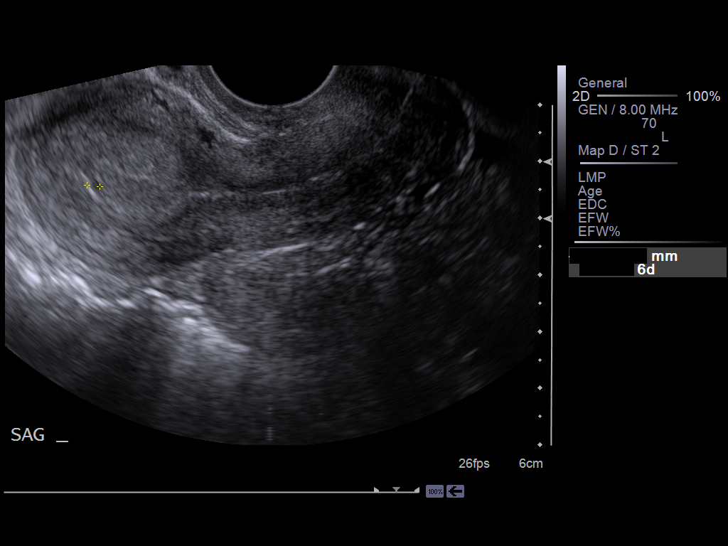
[im 38/38]
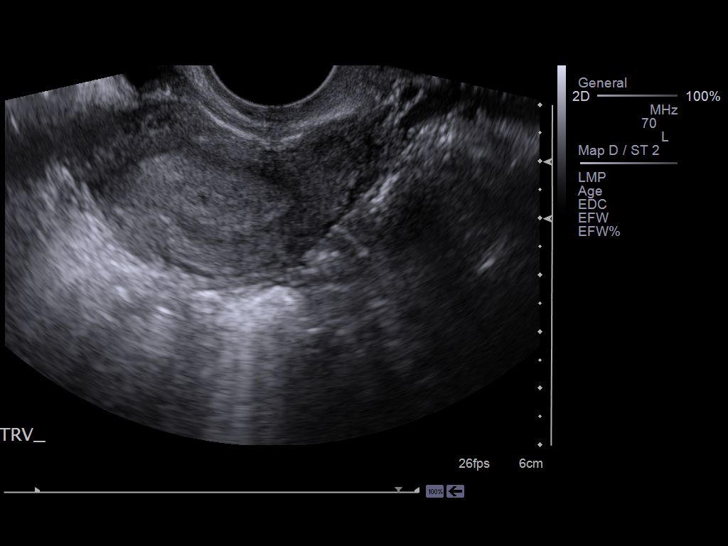

[14 of 28 positions shown; findings below may reference images not displayed]

Intrauterine gestational sac:  Questionably visualized
Yolk sac: Not identified
Embryo: Not identified
Cardiac Activity: N/A
Heart Rate: N/A bpm

MSD: 2.3 mm    4  w   6  d

Maternal uterus/adnexae:
No definite subchorionic hemorrhage of the.
Right ovary normal size and morphology, 4.0 x 2.3 x 2.4 cm.
Left ovary normal size and morphology, 2.6 x 2.0 x 1.9 cm.
Trace free pelvic fluid.
No adnexal masses.
IMPRESSION: Questionable visualization of a tiny gestational sac within uterus,
measuring 2.3 mm diameter; this would correspond to a gestational
age of 4 weeks 6 days EGA, if this represents infestation sac.
No evidence of ectopic pregnancy identified.
Recommend follow-up ultrasound in 10-14 days to confirm
intrauterine gestation and establish fetal viability.

## 2013-07-16 ENCOUNTER — Emergency Department (HOSPITAL_COMMUNITY)
Admission: EM | Admit: 2013-07-16 | Discharge: 2013-07-16 | Disposition: A | Discharge status: Home / Self Care | Payer: Medicaid Other | Attending: Emergency Medicine | Admitting: Emergency Medicine

## 2013-07-16 DIAGNOSIS — T401X1A Poisoning by heroin, accidental (unintentional), initial encounter: Secondary | ICD-10-CM | POA: Insufficient documentation

## 2013-07-16 DIAGNOSIS — Z8619 Personal history of other infectious and parasitic diseases: Secondary | ICD-10-CM | POA: Insufficient documentation

## 2013-07-16 DIAGNOSIS — F17200 Nicotine dependence, unspecified, uncomplicated: Secondary | ICD-10-CM | POA: Insufficient documentation

## 2013-07-16 DIAGNOSIS — Z8659 Personal history of other mental and behavioral disorders: Secondary | ICD-10-CM | POA: Insufficient documentation

## 2013-07-16 DIAGNOSIS — T401X4A Poisoning by heroin, undetermined, initial encounter: Secondary | ICD-10-CM | POA: Insufficient documentation

## 2013-07-16 DIAGNOSIS — J45909 Unspecified asthma, uncomplicated: Secondary | ICD-10-CM | POA: Insufficient documentation

## 2013-07-16 DIAGNOSIS — Y929 Unspecified place or not applicable: Secondary | ICD-10-CM | POA: Insufficient documentation

## 2013-07-16 DIAGNOSIS — Z8742 Personal history of other diseases of the female genital tract: Secondary | ICD-10-CM | POA: Insufficient documentation

## 2013-07-16 DIAGNOSIS — Y939 Activity, unspecified: Secondary | ICD-10-CM | POA: Insufficient documentation

## 2013-07-16 MED ORDER — ONDANSETRON HCL 4 MG/2ML IJ SOLN
4.0000 mg | Freq: Once | INTRAMUSCULAR | Status: AC
  Administered 2013-07-16: 4 mg via INTRAVENOUS
  Filled 2013-07-16: qty 2

## 2013-07-16 NOTE — ED Notes (Signed)
Pt took heroin . Boyfriend called EMS . Pt was unconscious on arrival. 2mg  narcan given in route to improve resp of 4.  Pt vomited large amount. Zofran given in route. Pt alert on arrival. Pt states she accidentally took to much heroin.

## 2013-07-16 NOTE — ED Provider Notes (Signed)
  CSN: [id number]     Arrival date & time 07/16/13  1807 History     First MD Initiated Contact with Patient 07/16/13 1816     No chief complaint on file.  (Consider location/radiation/quality/duration/timing/severity/associated sxs/prior Treatment) HPI Patient presents to the emergency department following a heroin overdose patient arrives fully awake after 2 mg of Narcan.  She was found by EMS to have shallow respirations at 4 per minute, and had vomited multiple times.  Patient has no complaints at this time  Past Medical History  Diagnosis Date  . Mononucleosis   . Ovarian cyst   . Chlamydia   . Bipolar 1 disorder   . Anxiety   . Asthma   . Heroin abuse    Past Surgical History  Procedure Laterality Date  . No past surgeries     No family history on file. History  Substance Use Topics  . Smoking status: Current Every Day Smoker -- 1.00 packs/day for 5 years    Types: Cigarettes  . Smokeless tobacco: Not on file  . Alcohol Use: No   OB History   Grav Para Term Preterm Abortions TAB SAB Ect Mult Living   2    1  1         Review of Systems All other systems negative except as documented in the HPI. All pertinent positives and negatives as reviewed in the HPI. Allergies  Hydrocodone; Advil; and Tramadol  Home Medications  No current outpatient prescriptions on file. BP 126/90  Pulse 99  Temp(Src) 98.2 F (36.8 C) (Oral)  Resp 24  SpO2 100%  LMP 11/17/2012 Physical Exam  Nursing note and vitals reviewed. Constitutional: She is oriented to person, place, and time. She appears well-developed and well-nourished. No distress.  HENT:  Head: Normocephalic and atraumatic.  Eyes: Pupils are equal, round, and reactive to light.  Neck: Normal range of motion. Neck supple.  Cardiovascular: Normal rate, regular rhythm and normal heart sounds.   Pulmonary/Chest: Effort normal and breath sounds normal. No respiratory distress.  Neurological: She is alert and oriented to  person, place, and time. She exhibits normal muscle tone. Coordination normal.  Skin: Skin is warm and dry. No rash noted. No erythema.    ED Course   Procedures (including critical care time) Patient has tolerated oral fluids here in the emergency department. The patients vitals have been normal here as well. MDM    Carlyle Dolly, PA-C 07/16/13 1945

## 2013-07-16 NOTE — ED Notes (Signed)
Bed: WA08 Expected date:  Expected time:  Means of arrival:  Comments: Heroin od, alert now

## 2013-07-17 NOTE — ED Provider Notes (Signed)
Medical screening examination/treatment/procedure(s) were performed by non-physician practitioner and as supervising physician I was immediately available for consultation/collaboration.  Ayari Liwanag N Dontel Harshberger, DO 07/17/13 0008 

## 2013-07-26 ENCOUNTER — Encounter (HOSPITAL_COMMUNITY): Payer: Self-pay | Admitting: *Deleted

## 2013-07-26 ENCOUNTER — Encounter (HOSPITAL_COMMUNITY): Payer: Self-pay | Admitting: Cardiology

## 2013-07-26 ENCOUNTER — Emergency Department (HOSPITAL_COMMUNITY)
Admission: EM | Admit: 2013-07-26 | Discharge: 2013-07-26 | Disposition: A | Discharge status: Home / Self Care | Payer: Medicaid Other | Attending: Emergency Medicine | Admitting: Emergency Medicine

## 2013-07-26 ENCOUNTER — Emergency Department (INDEPENDENT_AMBULATORY_CARE_PROVIDER_SITE_OTHER)
Admission: EM | Admit: 2013-07-26 | Discharge: 2013-07-26 | Disposition: A | Discharge status: Nursing Home (Includes ICF) | Payer: Medicaid Other | Source: Home / Self Care | Attending: Family Medicine | Admitting: Family Medicine

## 2013-07-26 DIAGNOSIS — S81009A Unspecified open wound, unspecified knee, initial encounter: Secondary | ICD-10-CM | POA: Insufficient documentation

## 2013-07-26 DIAGNOSIS — S40029A Contusion of unspecified upper arm, initial encounter: Secondary | ICD-10-CM | POA: Insufficient documentation

## 2013-07-26 DIAGNOSIS — W540XXA Bitten by dog, initial encounter: Secondary | ICD-10-CM | POA: Insufficient documentation

## 2013-07-26 DIAGNOSIS — J45909 Unspecified asthma, uncomplicated: Secondary | ICD-10-CM | POA: Insufficient documentation

## 2013-07-26 DIAGNOSIS — F1110 Opioid abuse, uncomplicated: Secondary | ICD-10-CM | POA: Insufficient documentation

## 2013-07-26 DIAGNOSIS — S41109A Unspecified open wound of unspecified upper arm, initial encounter: Secondary | ICD-10-CM | POA: Insufficient documentation

## 2013-07-26 DIAGNOSIS — L03116 Cellulitis of left lower limb: Secondary | ICD-10-CM

## 2013-07-26 DIAGNOSIS — S91052A Open bite, left ankle, initial encounter: Secondary | ICD-10-CM

## 2013-07-26 DIAGNOSIS — Y9389 Activity, other specified: Secondary | ICD-10-CM | POA: Insufficient documentation

## 2013-07-26 DIAGNOSIS — L02419 Cutaneous abscess of limb, unspecified: Secondary | ICD-10-CM

## 2013-07-26 DIAGNOSIS — Y9289 Other specified places as the place of occurrence of the external cause: Secondary | ICD-10-CM | POA: Insufficient documentation

## 2013-07-26 DIAGNOSIS — Z8659 Personal history of other mental and behavioral disorders: Secondary | ICD-10-CM | POA: Insufficient documentation

## 2013-07-26 DIAGNOSIS — F17200 Nicotine dependence, unspecified, uncomplicated: Secondary | ICD-10-CM | POA: Insufficient documentation

## 2013-07-26 DIAGNOSIS — Z8619 Personal history of other infectious and parasitic diseases: Secondary | ICD-10-CM | POA: Insufficient documentation

## 2013-07-26 DIAGNOSIS — T148XXA Other injury of unspecified body region, initial encounter: Secondary | ICD-10-CM

## 2013-07-26 DIAGNOSIS — Z8742 Personal history of other diseases of the female genital tract: Secondary | ICD-10-CM | POA: Insufficient documentation

## 2013-07-26 MED ORDER — OXYCODONE-ACETAMINOPHEN 5-325 MG PO TABS: 1.0000 | ORAL_TABLET | Freq: Four times a day (QID) | ORAL | Status: DC | PRN

## 2013-07-26 MED ORDER — HYDROMORPHONE HCL PF 1 MG/ML IJ SOLN: 0.5000 mg | Freq: Once | INTRAMUSCULAR | Status: DC

## 2013-07-26 MED ORDER — AMOXICILLIN-POT CLAVULANATE 875-125 MG PO TABS: 1.0000 | ORAL_TABLET | Freq: Two times a day (BID) | ORAL | Status: DC

## 2013-07-26 MED ORDER — HYDROMORPHONE HCL PF 1 MG/ML IJ SOLN
0.5000 mg | Freq: Once | INTRAMUSCULAR | Status: AC
  Administered 2013-07-26: 0.5 mg via INTRAMUSCULAR
  Filled 2013-07-26: qty 1

## 2013-07-26 MED ORDER — DIPHENHYDRAMINE HCL 25 MG PO CAPS
25.0000 mg | ORAL_CAPSULE | Freq: Once | ORAL | Status: AC
  Administered 2013-07-26: 25 mg via ORAL
  Filled 2013-07-26: qty 1

## 2013-07-26 NOTE — ED Notes (Signed)
animal  Bite  Form  Faxed  To  Animal  Control

## 2013-07-26 NOTE — ED Notes (Addendum)
Pt states she was bitten by fathers dog last night. Pt has visible dog bite to posterior rt arm, and left ankle. Pt states dog is up to date on shots. Pt was seen at [facility] and sent here. Pt rates pain 9/10. Animal control was notified at [facility].

## 2013-07-26 NOTE — ED Provider Notes (Signed)
CSN: [id number]     Arrival date & time 07/26/13  1328 History   First MD Initiated Contact with Patient 07/26/13 1402     Chief Complaint  Patient presents with  . Animal Bite   (Consider location/radiation/quality/duration/timing/severity/associated sxs/prior Treatment) HPI Comments: Patient is a 22 year old female with history of heroin abuse and bipolar 1 disorder who presents for left ankle pain and swelling after being bit by her dog last night. Patient states that pain left ankle is throbbing in nature and nonradiating. She states the pain has been constant and is worse with palpation and weightbearing. She denies alleviating factors and states she has taken ibuprofen and acetaminophen without relief. Also endorses bite marks to R upper extremity with associated contusion, though this pain is not nearly as severe as her ankle. Patient states her tetanus is up to date and was administered 2 years ago. She states the dog is up-to-date on his immunizations and has been given the rabies vaccination. Patient seen in [facility] urgent care PTA.  The history is provided by the patient. No language interpreter was used.    Past Medical History  Diagnosis Date  . Mononucleosis   . Ovarian cyst   . Chlamydia   . Bipolar 1 disorder   . Anxiety   . Asthma   . Heroin abuse    Past Surgical History  Procedure Laterality Date  . No past surgeries     History reviewed. No pertinent family history. History  Substance Use Topics  . Smoking status: Current Every Day Smoker -- 1.00 packs/day for 5 years    Types: Cigarettes  . Smokeless tobacco: Not on file  . Alcohol Use: No   OB History   Grav Para Term Preterm Abortions TAB SAB Ect Mult Living   2    1  1         Review of Systems  Constitutional: Negative for fever.  Musculoskeletal: Positive for myalgias, joint swelling and arthralgias.  Skin: Positive for wound. Negative for pallor.  Neurological: Negative for weakness and numbness.  All  other systems reviewed and are negative.   Allergies  Hydrocodone; Advil; and Tramadol  Home Medications   Current Outpatient Rx  Name  Route  Sig  Dispense  Refill  . amoxicillin-clavulanate (AUGMENTIN) 875-125 MG per tablet   Oral   Take 1 tablet by mouth every 12 (twelve) hours.   20 tablet   0   . oxyCODONE-acetaminophen (PERCOCET/ROXICET) 5-325 MG per tablet   Oral   Take 1 tablet by mouth every 6 (six) hours as needed for pain.   7 tablet   0    BP 119/82  Pulse 100  Temp(Src) 98.3 F (36.8 C) (Oral)  Resp 18  SpO2 99%  LMP 06/27/2013  Physical Exam  Nursing note and vitals reviewed. Constitutional: She is oriented to person, place, and time. She appears well-developed and well-nourished. No distress.  HENT:  Head: Normocephalic and atraumatic.  Eyes: Conjunctivae and EOM are normal. No scleral icterus.  Neck: Normal range of motion.  Cardiovascular: Normal rate, regular rhythm and intact distal pulses.   Dorsalis pedis and posterior tibial pulses 2+ bilaterally. Capillary refill normal.  Pulmonary/Chest: Effort normal. No respiratory distress.  Musculoskeletal:       Left ankle: She exhibits decreased range of motion (secondary to pain), swelling, ecchymosis (mild) and laceration (multiple superficial lacerations to dorsal lateral and posterior L ankle). She exhibits no deformity and normal pulse. Tenderness. Lateral malleolus tenderness found. Achilles  tendon normal.       Right upper arm: She exhibits tenderness. She exhibits no deformity and no laceration.       Arms:      Feet:  Neurological: She is alert and oriented to person, place, and time. She has normal reflexes.  No sensory or motor deficits appreciated. Patient moves extremities without ataxia. DTRs normal and symmetric.  Skin: Skin is warm and dry. No rash noted. She is not diaphoretic. No erythema. No pallor.  Psychiatric: She has a normal mood and affect. Her behavior is normal.   ED Course    Procedures (including critical care time) Labs Review Labs Reviewed - No data to display  Imaging Review No results found.  MDM   1. Animal bite    22 year old female with a history of heroin abuse who presents for left ankle pain secondary to an animal bite which occurred last night. Physical exam significant for multiple superficial lacerations to her dorsal lateral left ankle as well as posterior left ankle with associated swelling and tenderness to palpation. There is mild erythema appreciated immediately surrounding the laceration sites. No heat-to-touch or red linear streaking appreciated. Doubt underlying cellulitis given physical exam findings, hemostability, afebrile vital signs, and timing since injury. Patient neurovascularly intact. Patient declining x-ray in ED today to further evaluate left ankle pain. Crutches ordered for weightbearing as tolerated. Tetanus up to date. Patient appropriate for d/c with course of Augmentin to cover for infection. Ibuprofen advised for pain control as well as RICE for ankle pain. Short course of Percocet prescribed as needed for severe pain. Return precautions discussed and patient is agreeable to plan with no unaddressed concerns. Patient seen also by Dr. Toy Cookey who is in agreement with this workup, assessment, management plan, and patient's stability for discharge.    Antony Madura, PA-C 07/26/13 1650

## 2013-07-26 NOTE — ED Provider Notes (Signed)
Medical screening examination/treatment/procedure(s) were conducted as a shared visit with non-physician practitioner(s) and myself.  I personally evaluated the patient during the encounter  Pt presents one day after dog bite to L ankle.  +ttp over wounds w/ minimal, but appropriate edema & erythema.  Pt refuses XR.  I believe she should be placed on abx ppx w/ augmentin, but wounds are not acutely infected.  Return precautions given for new or worsening symptoms including worsening redness, swelling, drainage, fever.   Shanna Cisco, MD 07/26/13 [phone]

## 2013-07-26 NOTE — ED Provider Notes (Signed)
CSN: [id number]     Arrival date & time 07/26/13  1207 History   First MD Initiated Contact with Patient 07/26/13 1217     Chief Complaint  Patient presents with  . Animal Bite   (Consider location/radiation/quality/duration/timing/severity/associated sxs/prior Treatment) HPI Comments:  22 year old female with history of heroine abuse and mood disorder. Here complaining of left ankle swelling and pain after she got bitten by her house dog last night. Pain is severe and throbbing patient also reports chills. Patient reports the dog is an inside dog and up-to-date in his immunizations. Did not observe rabies symptoms prior the incident.     Past Medical History  Diagnosis Date  . Mononucleosis   . Ovarian cyst   . Chlamydia   . Bipolar 1 disorder   . Anxiety   . Asthma   . Heroin abuse    Past Surgical History  Procedure Laterality Date  . No past surgeries     History reviewed. No pertinent family history. History  Substance Use Topics  . Smoking status: Current Every Day Smoker -- 1.00 packs/day for 5 years    Types: Cigarettes  . Smokeless tobacco: Not on file  . Alcohol Use: No   OB History   Grav Para Term Preterm Abortions TAB SAB Ect Mult Living   2    1  1         Review of Systems  Constitutional: Positive for chills. Negative for fever.  Gastrointestinal: Negative for nausea and vomiting.  Musculoskeletal:       Ankle swelling and pain as per HPI  Skin: Positive for rash and wound.  All other systems reviewed and are negative.    Allergies  Hydrocodone; Advil; and Tramadol  Home Medications  No current outpatient prescriptions on file. BP 131/84  Pulse 117  Temp(Src) 98.3 F (36.8 C) (Oral)  Resp 24  SpO2 97%  LMP 06/27/2013  Breastfeeding? Unknown Physical Exam  Nursing note and vitals reviewed. Constitutional: She is oriented to person, place, and time.  Uncomfortable. Shaky. Tearful.   HENT:  Head: Normocephalic and atraumatic.   Cardiovascular: Normal heart sounds.   No murmur heard. Tachycardic.  Pulmonary/Chest: Breath sounds normal.  Neurological: She is alert and oriented to person, place, and time.  Skin:  Left ankle: Several superficial linear lacerations and abrasions in lateral and medial ankle area. There is associated mild to moderate swelling and erythema. Area is very tender to palpation, no fluctuations, crepitus or drainage.    ED Course  Procedures (including critical care time) Labs Review Labs Reviewed - No data to display Imaging Review No results found.  MDM   1. Dog bite of ankle, left, initial encounter   2. Cellulitis of left ankle     22 year old female with history of heroine abuse and mood disorder. Here complaining of left ankle swelling and pain after she got bitten by her house dog last night. Pain is severe and throbbing patient also reports chills. On exam: Patient is very uncomfortable and shaky. Left ankle with mild to moderate swelling and erythema. There are several wounds mostly appeared linear and superficial but also few areas with possible puncture wounds. Impress early developing cellulitis. She is normotensive but has tachycardia with a heart rate of 117. I decided to transfer her to the emergency department for further evaluation and management as she probably will require IV antibiotic dose before discharge. No medications were administered at [facility] prior to transfer.     Damean Poffenberger  Moreno-Coll, MD 07/26/13 1319

## 2013-07-26 NOTE — ED Notes (Signed)
Pt   Reports   She  Was  Bitten   By  Her  Fathers  Dog      Last    Pm      She  Was  Bitten on r  Upper  Arm  And  l  Lower  Leg      She  States  The    Dog  Is  Up  To  Date        On his  Shots   She  States  She      walkked  Into  Her fathers  Room last pm  And the  Dog  Bit  Her       She  States  She  Is  utd  On tetanus  Shot

## 2013-07-26 NOTE — ED Notes (Signed)
Discharge instructions reviewed. Pt verbalized understanding.  

## 2013-07-26 NOTE — ED Notes (Signed)
Pt reports that she was bitten last night by her dads dog on her right arm and left leg. States that the dog is up to date on shots. Reports intense pain and has puncture wounds to the arm and leg. States that she was sent from [facility] for further evaluation. Animal control already notified.

## 2013-07-29 ENCOUNTER — Emergency Department: Payer: Self-pay | Admitting: Emergency Medicine

## 2013-07-30 ENCOUNTER — Emergency Department: Payer: Self-pay | Admitting: Emergency Medicine

## 2013-07-30 LAB — BASIC METABOLIC PANEL
Anion Gap: 5 — ABNORMAL LOW (ref 7–16)
BUN: 5 mg/dL — ABNORMAL LOW (ref 7–18)
Calcium, Total: 8.6 mg/dL (ref 8.5–10.1)
Chloride: 107 mmol/L (ref 98–107)
Co2: 26 mmol/L (ref 21–32)
Creatinine: 0.61 mg/dL (ref 0.60–1.30)
EGFR (African American): >60
EGFR (Non-African Amer.): >60
Glucose: 126 mg/dL — ABNORMAL HIGH (ref 65–99)
Osmolality: 274 (ref 275–301)
Potassium: 3.5 mmol/L (ref 3.5–5.1)

## 2013-07-30 LAB — CBC
HGB: 12.2 g/dL (ref 12.0–16.0)
RBC: 3.98 10*6/uL (ref 3.80–5.20)
WBC: 7.6 10*3/uL (ref 3.6–11.0)

## 2013-10-03 ENCOUNTER — Inpatient Hospital Stay (HOSPITAL_COMMUNITY)
Admission: EM | Admit: 2013-10-03 | Discharge: 2013-10-05 | DRG: 093 | Payer: Medicaid Other | Attending: Internal Medicine | Admitting: Internal Medicine

## 2013-10-03 ENCOUNTER — Inpatient Hospital Stay (HOSPITAL_COMMUNITY): Payer: Medicaid Other

## 2013-10-03 ENCOUNTER — Encounter (HOSPITAL_COMMUNITY): Payer: Self-pay | Admitting: Emergency Medicine

## 2013-10-03 DIAGNOSIS — F1210 Cannabis abuse, uncomplicated: Secondary | ICD-10-CM | POA: Diagnosis present

## 2013-10-03 DIAGNOSIS — F17200 Nicotine dependence, unspecified, uncomplicated: Secondary | ICD-10-CM | POA: Diagnosis present

## 2013-10-03 DIAGNOSIS — T401X1A Poisoning by heroin, accidental (unintentional), initial encounter: Secondary | ICD-10-CM | POA: Diagnosis present

## 2013-10-03 DIAGNOSIS — T424X1A Poisoning by benzodiazepines, accidental (unintentional), initial encounter: Secondary | ICD-10-CM | POA: Diagnosis present

## 2013-10-03 DIAGNOSIS — F1310 Sedative, hypnotic or anxiolytic abuse, uncomplicated: Secondary | ICD-10-CM | POA: Diagnosis present

## 2013-10-03 DIAGNOSIS — F1191 Opioid use, unspecified, in remission: Secondary | ICD-10-CM | POA: Diagnosis present

## 2013-10-03 DIAGNOSIS — T401X4A Poisoning by heroin, undetermined, initial encounter: Secondary | ICD-10-CM | POA: Diagnosis present

## 2013-10-03 DIAGNOSIS — F319 Bipolar disorder, unspecified: Secondary | ICD-10-CM | POA: Diagnosis present

## 2013-10-03 DIAGNOSIS — F11221 Opioid dependence with intoxication delirium: Secondary | ICD-10-CM

## 2013-10-03 DIAGNOSIS — G92 Toxic encephalopathy: Secondary | ICD-10-CM | POA: Diagnosis present

## 2013-10-03 DIAGNOSIS — G929 Unspecified toxic encephalopathy: Principal | ICD-10-CM | POA: Diagnosis present

## 2013-10-03 DIAGNOSIS — F19921 Other psychoactive substance use, unspecified with intoxication with delirium: Secondary | ICD-10-CM

## 2013-10-03 DIAGNOSIS — T424X4A Poisoning by benzodiazepines, undetermined, initial encounter: Secondary | ICD-10-CM | POA: Diagnosis present

## 2013-10-03 DIAGNOSIS — F1120 Opioid dependence, uncomplicated: Secondary | ICD-10-CM

## 2013-10-03 DIAGNOSIS — F1110 Opioid abuse, uncomplicated: Secondary | ICD-10-CM | POA: Diagnosis present

## 2013-10-03 LAB — BLOOD GAS, ARTERIAL
Acid-base deficit: 1.1 mmol/L (ref 0.0–2.0)
Drawn by: 103701
FIO2: 0.21 %
Patient temperature: 98.6
TCO2: 19.6 mmol/L (ref 0–100)
pCO2 arterial: 33.2 mmHg — ABNORMAL LOW (ref 35.0–45.0)

## 2013-10-03 LAB — CBC
HCT: 36.5 % (ref 36.0–46.0)
Hemoglobin: 12.3 g/dL (ref 12.0–15.0)
MCH: 28.5 pg (ref 26.0–34.0)
MCHC: 33.7 g/dL (ref 30.0–36.0)
WBC: 8.1 10*3/uL (ref 4.0–10.5)

## 2013-10-03 LAB — URINE MICROSCOPIC-ADD ON

## 2013-10-03 LAB — CBC WITH DIFFERENTIAL/PLATELET
Basophils Absolute: 0 10*3/uL (ref 0.0–0.1)
Basophils Relative: 0 % (ref 0–1)
Eosinophils Absolute: 0.3 10*3/uL (ref 0.0–0.7)
Hemoglobin: 12.4 g/dL (ref 12.0–15.0)
MCH: 28.6 pg (ref 26.0–34.0)
MCHC: 33.7 g/dL (ref 30.0–36.0)
Neutro Abs: 8 10*3/uL — ABNORMAL HIGH (ref 1.7–7.7)
Neutrophils Relative %: 66 % (ref 43–77)
Platelets: 308 10*3/uL (ref 150–400)
RDW: 13.1 % (ref 11.5–15.5)

## 2013-10-03 LAB — ETHANOL: Alcohol, Ethyl (B): <11 mg/dL (ref 0–11)

## 2013-10-03 LAB — RAPID URINE DRUG SCREEN, HOSP PERFORMED
Amphetamines: NOT DETECTED
Opiates: POSITIVE — AB
Tetrahydrocannabinol: POSITIVE — AB

## 2013-10-03 LAB — CREATININE, SERUM
GFR calc Af Amer: >90 mL/min (ref >90)
GFR calc non Af Amer: >90 mL/min (ref >90)

## 2013-10-03 LAB — URINALYSIS, ROUTINE W REFLEX MICROSCOPIC
Hgb urine dipstick: NEGATIVE
Nitrite: NEGATIVE
Protein, ur: NEGATIVE mg/dL
Specific Gravity, Urine: 1.013 (ref 1.005–1.030)
Urobilinogen, UA: 1 mg/dL (ref 0.0–1.0)

## 2013-10-03 LAB — POCT I-STAT, CHEM 8
Chloride: 102 mEq/L (ref 96–112)
Creatinine, Ser: 1 mg/dL (ref 0.50–1.10)
Glucose, Bld: 92 mg/dL (ref 70–99)
HCT: 38 % (ref 36.0–46.0)
Hemoglobin: 12.9 g/dL (ref 12.0–15.0)
Potassium: 3.5 mEq/L (ref 3.5–5.1)
Sodium: 140 mEq/L (ref 135–145)

## 2013-10-03 LAB — MRSA PCR SCREENING: MRSA by PCR: NEGATIVE

## 2013-10-03 MED ORDER — NALOXONE HCL 1 MG/ML IJ SOLN
2.0000 mg | INTRAMUSCULAR | Status: DC | PRN
  Filled 2013-10-03: qty 2

## 2013-10-03 MED ORDER — NALOXONE HCL 1 MG/ML IJ SOLN
2.0000 mg | Freq: Once | INTRAMUSCULAR | Status: AC
  Administered 2013-10-03: 2 mg via INTRAVENOUS

## 2013-10-03 MED ORDER — NALOXONE HCL 1 MG/ML IJ SOLN
INTRAMUSCULAR | Status: AC
  Filled 2013-10-03: qty 2

## 2013-10-03 MED ORDER — NALOXONE HCL 1 MG/ML IJ SOLN
1.0000 mg/h | INTRAVENOUS | Status: DC
  Administered 2013-10-03: 2 mg/h via INTRAVENOUS
  Administered 2013-10-03: 1 mg/h via INTRAVENOUS
  Administered 2013-10-03 – 2013-10-04 (×3): 2 mg/h via INTRAVENOUS
  Administered 2013-10-04 (×2): 1 mg/h via INTRAVENOUS
  Filled 2013-10-03 (×5): qty 4

## 2013-10-03 MED ORDER — ALBUTEROL SULFATE (5 MG/ML) 0.5% IN NEBU: 2.5000 mg | INHALATION_SOLUTION | RESPIRATORY_TRACT | Status: DC | PRN

## 2013-10-03 MED ORDER — ENOXAPARIN SODIUM 40 MG/0.4ML ~~LOC~~ SOLN
40.0000 mg | SUBCUTANEOUS | Status: DC
  Administered 2013-10-03: 40 mg via SUBCUTANEOUS
  Filled 2013-10-03 (×2): qty 0.4

## 2013-10-03 MED ORDER — ACETAMINOPHEN 325 MG PO TABS: 650.0000 mg | ORAL_TABLET | Freq: Four times a day (QID) | ORAL | Status: DC | PRN

## 2013-10-03 MED ORDER — NALOXONE HCL 1 MG/ML IJ SOLN
INTRAMUSCULAR | Status: AC
  Administered 2013-10-03: 2 mg via INTRAVENOUS
  Filled 2013-10-03: qty 2

## 2013-10-03 MED ORDER — NALOXONE HCL 1 MG/ML IJ SOLN: 2.0000 mg | INTRAMUSCULAR | Status: DC | PRN

## 2013-10-03 MED ORDER — ONDANSETRON HCL 4 MG/2ML IJ SOLN
4.0000 mg | Freq: Four times a day (QID) | INTRAMUSCULAR | Status: DC | PRN
  Administered 2013-10-04: 4 mg via INTRAVENOUS
  Filled 2013-10-03: qty 2

## 2013-10-03 MED ORDER — ACETAMINOPHEN 650 MG RE SUPP: 650.0000 mg | Freq: Four times a day (QID) | RECTAL | Status: DC | PRN

## 2013-10-03 MED ORDER — ONDANSETRON HCL 4 MG PO TABS: 4.0000 mg | ORAL_TABLET | Freq: Four times a day (QID) | ORAL | Status: DC | PRN

## 2013-10-03 MED ORDER — SODIUM CHLORIDE 0.9 % IV SOLN
INTRAVENOUS | Status: DC
  Administered 2013-10-03: 20:00:00 via INTRAVENOUS

## 2013-10-03 MED ORDER — SODIUM CHLORIDE 0.9 % IJ SOLN: 3.0000 mL | Freq: Two times a day (BID) | INTRAMUSCULAR | Status: DC

## 2013-10-03 MED ORDER — SODIUM CHLORIDE 0.9 % IV SOLN
INTRAVENOUS | Status: DC
  Administered 2013-10-03: 17:00:00 via INTRAVENOUS

## 2013-10-03 MED ORDER — SODIUM CHLORIDE 0.9 % IV SOLN
INTRAVENOUS | Status: DC
  Administered 2013-10-03: 19:00:00 via INTRAVENOUS

## 2013-10-03 MED ORDER — SODIUM CHLORIDE 0.9 % IV BOLUS (SEPSIS)
500.0000 mL | Freq: Once | INTRAVENOUS | Status: AC
  Administered 2013-10-03: 500 mL via INTRAVENOUS

## 2013-10-03 NOTE — ED Provider Notes (Signed)
CSN: [id number]     Arrival date & time 10/03/13  1555 History   First MD Initiated Contact with Patient 10/03/13 1613     Chief Complaint  Patient presents with  . Drug Overdose   (Consider location/radiation/quality/duration/timing/severity/associated sxs/prior Treatment) HPI Comments: PALMINA CLODFELTER is a 22 y.o. female who was brought in by EMS for suspected heroin overdose. She was found by police attempting to break into her house. During EMS transport. She did not receive any medications. After arrival here, she decompensated. She is unable to contribute any history.  Level V caveat: Altered mental status   Patient is a 22 y.o. female presenting with Overdose. The history is provided by the patient.  Drug Overdose    Past Medical History  Diagnosis Date  . Mononucleosis   . Ovarian cyst   . Chlamydia   . Bipolar 1 disorder   . Anxiety   . Asthma   . Heroin abuse    Past Surgical History  Procedure Laterality Date  . No past surgeries     History reviewed. No pertinent family history. History  Substance Use Topics  . Smoking status: Current Every Day Smoker -- 1.00 packs/day for 5 years    Types: Cigarettes  . Smokeless tobacco: Not on file  . Alcohol Use: No   OB History   Grav Para Term Preterm Abortions TAB SAB Ect Mult Living   2    1  1         Review of Systems  Unable to perform ROS   Allergies  Hydrocodone; Advil; and Tramadol  Home Medications   No current outpatient prescriptions on file. BP 117/62  Pulse 82  Temp(Src) 99 F (37.2 C) (Oral)  Resp 28  SpO2 100% Physical Exam  Nursing note and vitals reviewed. Constitutional: She appears well-developed and well-nourished.  HENT:  Head: Normocephalic and atraumatic.  Weak gag reflex is present. No oral or dental lesions.  Eyes: Conjunctivae and EOM are normal. Pupils are equal, round, and reactive to light.  Neck: Normal range of motion and phonation normal. Neck supple.   Cardiovascular: Normal rate, regular rhythm and intact distal pulses.   She is hypotensive  Pulmonary/Chest: She is in respiratory distress (shallow respirations). She has no wheezes. She has no rales. She exhibits no tenderness.  Abdominal: Soft. She exhibits no distension. There is no tenderness. There is no guarding.  Musculoskeletal: Normal range of motion. She exhibits no edema.  Neurological: She is alert. No cranial nerve deficit. She exhibits normal muscle tone.  She is responsive only to deep sternal rub; with purposeful movement  Skin: Skin is warm and dry. No rash noted. No erythema.  Psychiatric:  Lethargic    ED Course  Procedures (including critical care time) Medications  naloxone (NARCAN) 4 mg in dextrose 5 % 250 mL infusion (2 mg/hr Intravenous New Bag/Given 10/03/13 2234)  enoxaparin (LOVENOX) injection 40 mg (not administered)  sodium chloride 0.9 % injection 3 mL (not administered)  0.9 %  sodium chloride infusion ( Intravenous New Bag/Given 10/03/13 1955)  acetaminophen (TYLENOL) tablet 650 mg (not administered)    Or  acetaminophen (TYLENOL) suppository 650 mg (not administered)  ondansetron (ZOFRAN) tablet 4 mg (not administered)    Or  ondansetron (ZOFRAN) injection 4 mg (not administered)  albuterol (PROVENTIL) (5 MG/ML) 0.5% nebulizer solution 2.5 mg (not administered)  naloxone (NARCAN) injection 2 mg (not administered)  naloxone (NARCAN) injection 2 mg (2 mg Intravenous Given 10/03/13 1620)  sodium  chloride 0.9 % bolus 500 mL (0 mLs Intravenous Stopped 10/03/13 1820)  naloxone [facility]) injection 2 mg (2 mg Intravenous Given 10/03/13 1841)    Patient Vitals for the past 24 hrs:  BP Temp Temp src Pulse Resp SpO2  10/03/13 2200 117/62 mmHg - - 82 28 100 %  10/03/13 2100 110/56 mmHg - - 73 25 98 %  10/03/13 1930 122/67 mmHg - - 87 32 100 %  10/03/13 1915 130/78 mmHg - - 77 26 100 %  10/03/13 1900 123/77 mmHg - - 73 24 100 %  10/03/13 1845 140/99 mmHg - - 92  23 100 %  10/03/13 1830 123/59 mmHg - - 77 18 98 %  10/03/13 1800 149/74 mmHg - - 77 24 97 %  10/03/13 1745 134/77 mmHg - - 68 23 99 %  10/03/13 1730 132/89 mmHg - - 97 23 100 %  10/03/13 1715 120/74 mmHg - - 76 23 100 %  10/03/13 1700 124/72 mmHg - - 77 24 100 %  10/03/13 1645 122/86 mmHg - - 88 22 100 %  10/03/13 1642 117/78 mmHg - - - - -  10/03/13 1630 117/78 mmHg - - - 29 -  10/03/13 1610 87/44 mmHg 99 F (37.2 C) Oral - 12 96 %     Initial resuscitative treatment with IV, Narcan- she had immediate arousal and normalization of pressure. This elevated arousal state lasted just a few minutes, then, she began to be sleepy. While awake, she, admitted to taking Xanax today.  Narcan drip, ordered  Current physical exam of the patient, an open Suboxone patch envelope containing a portion of a patch, was found.   6:30 PM Reevaluation with update and discussion. After initial assessment and treatment, an updated evaluation reveals she remains obtunded. She had an episode of vomiting, and was able to control her airway throughout. There was no desaturation or respiratory distress after vomiting. Imane Burrough L   6:20 PM-Consult complete with GHIMERE. Patient case explained and discussed. He agrees to admit patient for further evaluation and treatment. Call ended at 1828  CRITICAL CARE Performed by: Flint Melter Total critical care time: 50 minutes Critical care time was exclusive of separately billable procedures and treating other patients. Critical care was necessary to treat or prevent imminent or life-threatening deterioration. Critical care was time spent personally by me on the following activities: development of treatment plan with patient and/or surrogate as well as nursing, discussions with consultants, evaluation of patient's response to treatment, examination of patient, obtaining history from patient or surrogate, ordering and performing treatments and interventions,  ordering and review of laboratory studies, ordering and review of radiographic studies, pulse oximetry and re-evaluation of patient's condition.  Labs Review Labs Reviewed  CBC WITH DIFFERENTIAL - Abnormal; Notable for the following:    WBC 12.0 (*)    Neutro Abs 8.0 (*)    Monocytes Absolute 1.2 (*)    All other components within normal limits  URINALYSIS, ROUTINE W REFLEX MICROSCOPIC - Abnormal; Notable for the following:    APPearance CLOUDY (*)    Leukocytes, UA TRACE (*)    All other components within normal limits  URINE RAPID DRUG SCREEN (HOSP PERFORMED) - Abnormal; Notable for the following:    Opiates POSITIVE (*)    Benzodiazepines POSITIVE (*)    Tetrahydrocannabinol POSITIVE (*)    All other components within normal limits  URINE MICROSCOPIC-ADD ON - Abnormal; Notable for the following:    Squamous Epithelial / LPF FEW (*)  All other components within normal limits  BLOOD GAS, ARTERIAL - Abnormal; Notable for the following:    pCO2 arterial 33.2 (*)    pO2, Arterial 73.3 (*)    All other components within normal limits  POCT I-STAT, CHEM 8 - Abnormal; Notable for the following:    BUN 5 (*)    Calcium, Ion 1.29 (*)    All other components within normal limits  MRSA PCR SCREENING  ETHANOL  CBC  CREATININE, SERUM  CBC  BASIC METABOLIC PANEL   Imaging Review Dg Chest Port 1 View  10/03/2013   CLINICAL DATA:  Vomiting. Aspiration.  EXAM: PORTABLE CHEST - 1 VIEW  COMPARISON:  05/06/2008.  FINDINGS: Low volume chest. Basilar atelectasis. There is no focal consolidation. Based on the low volumes, airspace disease is difficult to exclude. Monitoring leads project over the chest. Mediastinal contours appear within normal limits. No pleural fluid.  IMPRESSION: Low volume chest. No gross consolidation to suggest aspiration pneumonitis. Consider repeat PA and lateral with full inspiration when patient condition permits for better evaluation.   Electronically Signed   By:  Andreas Newport M.D.   On: 10/03/2013 19:33    EKG Interpretation     Ventricular Rate:  79 PR Interval:  166 QRS Duration: 82 QT Interval:  373 QTC Calculation: 428 R Axis:   53 Text Interpretation:  Sinus rhythm since last tracing no significant change            MDM   1. Polysubstance dependence including opioid type drug, episodic abuse, with delirium   2. Overdose, initial encounter   3. Benzodiazepine abuse   4. Encephalopathy, toxic   5. Heroin abuse    Apparent polysubstance overdose with persistent altered mental status indicated the moderate congestion. Patient is hemodynamically stable and her airway is controlled. She needs admission to a close monitoring unit for further assessment as dictated by her treatment course. Her heroin abuse is recurrent. Doubt suicidal act.  Nursing Notes Reviewed/ Care Coordinated, and agree without changes. Applicable Imaging Reviewed.  Interpretation of Laboratory Data incorporated into ED treatment  Plan: Admit    Flint Melter, MD 10/03/13 2241

## 2013-10-03 NOTE — ED Notes (Signed)
Pink shirt white shirt black pants 2 pair underwear socks pink shoes hair bow 1 pair white earrings with clear stone 1 pair white hoop earrings 1 white ball earring 1 necklace with flowers $32
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
## 2013-10-03 NOTE — ED Notes (Addendum)
Pt given 2mg  more of Narcan push per MD request. Pt became agitated. Pt vomited yellow bile. Turned on side and suctioned as to prevent aspiration.
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
## 2013-10-03 NOTE — ED Notes (Signed)
Bed: AV40 Expected date:  Expected time:  Means of arrival:  Comments: ems- heroine overdose, stable now, lethargic

## 2013-10-03 NOTE — ED Notes (Signed)
Belongings placed in [place] [address] by Melissa NT.

## 2013-10-03 NOTE — ED Notes (Signed)
Walked in rm pt attempting to get out of bed had IV unhooked leads for ekg off saying she was going to leave RN made aware

## 2013-10-04 DIAGNOSIS — F1120 Opioid dependence, uncomplicated: Secondary | ICD-10-CM

## 2013-10-04 LAB — CBC
HCT: 37.3 % (ref 36.0–46.0)
Hemoglobin: 12.8 g/dL (ref 12.0–15.0)
MCH: 28.8 pg (ref 26.0–34.0)
MCHC: 34.3 g/dL (ref 30.0–36.0)
MCV: 83.8 fL (ref 78.0–100.0)
Platelets: 316 K/uL (ref 150–400)
RBC: 4.45 MIL/uL (ref 3.87–5.11)
RDW: 12.9 % (ref 11.5–15.5)
WBC: 10 K/uL (ref 4.0–10.5)

## 2013-10-04 LAB — BASIC METABOLIC PANEL
BUN: 5 mg/dL — ABNORMAL LOW (ref 6–23)
CO2: 21 mEq/L (ref 19–32)
Calcium: 9.5 mg/dL (ref 8.4–10.5)
Glucose, Bld: 127 mg/dL — ABNORMAL HIGH (ref 70–99)
Potassium: 3.4 mEq/L — ABNORMAL LOW (ref 3.5–5.1)
Sodium: 134 mEq/L — ABNORMAL LOW (ref 135–145)

## 2013-10-04 LAB — GLUCOSE, CAPILLARY: Glucose-Capillary: 101 mg/dL — ABNORMAL HIGH (ref 70–99)
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
## 2013-10-04 NOTE — Consult Note (Signed)
Reason for consultation: Heroin overdose and poly substance abuse Referring Physician: Henderson Cloud, MD   Meghan Bullock is an 22 y.o. female.  HPI: Patient was seen and chart reviewed. Patient stated that she injected $15 worth of heroin and took 1 mg Xanax for with the intention to get high. Patient stated she does realize when she woke up in the hospital today. Patient stated she has been suffering with polysubstance abuse: 5-6 years and has been stealing money from her mother and grandparents and also incarcerated 3 times for the same. Patient reported she was substance abuse treatment program in the past. She was placed on probation which she was treated so she might go either to [place] [facility] or court ordered substance abuse treatment program [facility].   patient has denied symptoms of depression anxiety, withdrawal symptoms, suicidal homicidal ideation intentions or plans. Patient has no evidence of psychotic symptoms. Patient was previously seen a psychiatrist Dr. Omelia Blackwater in [place] and abuse benzodiazepines. Patient reported she was abusing benzodiazepines, opiates including Klonopin, Xanax, Percocet oxycodone Roxicodone and now heroin. Patient has no previous acute psychiatric hospitalization. Patient urine drug  screen is positive for benzodiazepines, opiates and tetrahydrocannabinol.  Mental Status Examination: Patient appeared as per his stated age,  dressed in hospital gown and has IV line and fairly groomed, and maintaining good eye contact. Patient has good mood and his affect was  appropriate and bright.she  has normal rate, rhythm, and volume of speech.  her thought process is linear and goal directed. Patient has denied suicidal, homicidal ideations, intentions or plans. Patient has no evidence of auditory or visual hallucinations, delusions, and paranoia. Patient has fair insight judgment and impulse control.  Past Medical History  Diagnosis Date  .  Mononucleosis   . Ovarian cyst   . Chlamydia   . Bipolar 1 disorder   . Anxiety   . Asthma   . Heroin abuse     Past Surgical History  Procedure Laterality Date  . No past surgeries      History reviewed. No pertinent family history.  Social History:  reports that she has been smoking Cigarettes.  She has a 5 pack-year smoking history. She does not have any smokeless tobacco history on file. She reports that she uses illicit drugs (Marijuana, IV, and Heroin). She reports that she does not drink alcohol.  Allergies:  Allergies  Allergen Reactions  . Hydrocodone Itching  . Advil [Ibuprofen] Rash    C/O itching with 800mg  IBU, but takes OTC Advil without any problems.  . Tramadol Rash    Medications: I have reviewed the patient's current medications.  Results for orders placed during the hospital encounter of 10/03/13 (from the past 48 hour(s))  POCT I-STAT, CHEM 8     Status: Abnormal   Collection Time    10/03/13  4:55 PM      Result Value Range   Sodium 140  135 - 145 mEq/L   Potassium 3.5  3.5 - 5.1 mEq/L   Chloride 102  96 - 112 mEq/L   BUN 5 (*) 6 - 23 mg/dL   Creatinine, Ser 9.52  0.50 - 1.10 mg/dL   Glucose, Bld 92  70 - 99 mg/dL   Calcium, Ion 8.41 (*) 1.12 - 1.23 mmol/L   TCO2 22  0 - 100 mmol/L   Hemoglobin 12.9  12.0 - 15.0 g/dL   HCT 32.4  40.1 - 02.7 %  URINALYSIS, ROUTINE W REFLEX MICROSCOPIC  Status: Abnormal   Collection Time    10/03/13  4:57 PM      Result Value Range   Color, Urine YELLOW  YELLOW   APPearance CLOUDY (*) CLEAR   Specific Gravity, Urine 1.013  1.005 - 1.030   pH 6.5  5.0 - 8.0   Glucose, UA NEGATIVE  NEGATIVE mg/dL   Hgb urine dipstick NEGATIVE  NEGATIVE   Bilirubin Urine NEGATIVE  NEGATIVE   Ketones, ur NEGATIVE  NEGATIVE mg/dL   Protein, ur NEGATIVE  NEGATIVE mg/dL   Urobilinogen, UA 1.0  0.0 - 1.0 mg/dL   Nitrite NEGATIVE  NEGATIVE   Leukocytes, UA TRACE (*) NEGATIVE  URINE RAPID DRUG SCREEN (HOSP PERFORMED)     Status:  Abnormal   Collection Time    10/03/13  4:57 PM      Result Value Range   Opiates POSITIVE (*) NONE DETECTED   Cocaine NONE DETECTED  NONE DETECTED   Benzodiazepines POSITIVE (*) NONE DETECTED   Amphetamines NONE DETECTED  NONE DETECTED   Tetrahydrocannabinol POSITIVE (*) NONE DETECTED   Barbiturates NONE DETECTED  NONE DETECTED   Comment:            DRUG SCREEN FOR MEDICAL PURPOSES     ONLY.  IF CONFIRMATION IS NEEDED     FOR ANY PURPOSE, NOTIFY LAB     WITHIN 5 DAYS.                LOWEST DETECTABLE LIMITS     FOR URINE DRUG SCREEN     Drug Class       Cutoff (ng/mL)     Amphetamine      1000     Barbiturate      200     Benzodiazepine   200     Tricyclics       300     Opiates          300     Cocaine          300     THC              50  URINE MICROSCOPIC-ADD ON     Status: Abnormal   Collection Time    10/03/13  4:57 PM      Result Value Range   Squamous Epithelial / LPF FEW (*) RARE   WBC, UA 3-6  <3 WBC/hpf   Urine-Other MUCOUS PRESENT    CBC WITH DIFFERENTIAL     Status: Abnormal   Collection Time    10/03/13  5:00 PM      Result Value Range   WBC 12.0 (*) 4.0 - 10.5 K/uL   RBC 4.34  3.87 - 5.11 MIL/uL   Hemoglobin 12.4  12.0 - 15.0 g/dL   HCT 78.2  95.6 - 21.3 %   MCV 84.8  78.0 - 100.0 fL   MCH 28.6  26.0 - 34.0 pg   MCHC 33.7  30.0 - 36.0 g/dL   RDW 08.6  57.8 - 46.9 %   Platelets 308  150 - 400 K/uL   Neutrophils Relative % 66  43 - 77 %   Neutro Abs 8.0 (*) 1.7 - 7.7 K/uL   Lymphocytes Relative 21  12 - 46 %   Lymphs Abs 2.6  0.7 - 4.0 K/uL   Monocytes Relative 10  3 - 12 %   Monocytes Absolute 1.2 (*) 0.1 - 1.0 K/uL   Eosinophils Relative 2  0 -  5 %   Eosinophils Absolute 0.3  0.0 - 0.7 K/uL   Basophils Relative 0  0 - 1 %   Basophils Absolute 0.0  0.0 - 0.1 K/uL  ETHANOL     Status: None   Collection Time    10/03/13  5:00 PM      Result Value Range   Alcohol, Ethyl (B) <11  0 - 11 mg/dL   Comment:            LOWEST DETECTABLE LIMIT FOR      SERUM ALCOHOL IS 11 mg/dL     FOR MEDICAL PURPOSES ONLY  BLOOD GAS, ARTERIAL     Status: Abnormal   Collection Time    10/03/13  6:51 PM      Result Value Range   FIO2 0.21     Delivery systems ROOM AIR     pH, Arterial 7.435  7.350 - 7.450   pCO2 arterial 33.2 (*) 35.0 - 45.0 mmHg   pO2, Arterial 73.3 (*) 80.0 - 100.0 mmHg   Bicarbonate 22.0  20.0 - 24.0 mEq/L   TCO2 19.6  0 - 100 mmol/L   Acid-base deficit 1.1  0.0 - 2.0 mmol/L   O2 Saturation 95.5     Patient temperature 98.6     Collection site BRACHIAL ARTERY     Drawn by [id number]     Sample type ARTERIAL    MRSA PCR SCREENING     Status: None   Collection Time    10/03/13  7:30 PM      Result Value Range   MRSA by PCR NEGATIVE  NEGATIVE   Comment:            The GeneXpert MRSA Assay (FDA     approved for NASAL specimens     only), is one component of a     comprehensive MRSA colonization     surveillance program. It is not     intended to diagnose MRSA     infection nor to guide or     monitor treatment for     MRSA infections.  CBC     Status: None   Collection Time    10/03/13  8:23 PM      Result Value Range   WBC 8.1  4.0 - 10.5 K/uL   RBC 4.32  3.87 - 5.11 MIL/uL   Hemoglobin 12.3  12.0 - 15.0 g/dL   HCT 04.5  40.9 - 81.1 %   MCV 84.5  78.0 - 100.0 fL   MCH 28.5  26.0 - 34.0 pg   MCHC 33.7  30.0 - 36.0 g/dL   RDW 91.4  78.2 - 95.6 %   Platelets 256  150 - 400 K/uL  CREATININE, SERUM     Status: None   Collection Time    10/03/13  8:23 PM      Result Value Range   Creatinine, Ser 0.61  0.50 - 1.10 mg/dL   Comment: DELTA CHECK NOTED   GFR calc non Af Amer >90  >90 mL/min   GFR calc Af Amer >90  >90 mL/min   Comment: (NOTE)     The eGFR has been calculated using the CKD EPI equation.     This calculation has not been validated in all clinical situations.     eGFR's persistently <90 mL/min signify possible Chronic Kidney     Disease.  CBC     Status: None   Collection Time    10/04/13  3:25 AM       Result Value Range   WBC 10.0  4.0 - 10.5 K/uL   RBC 4.45  3.87 - 5.11 MIL/uL   Hemoglobin 12.8  12.0 - 15.0 g/dL   HCT 16.1  09.6 - 04.5 %   MCV 83.8  78.0 - 100.0 fL   MCH 28.8  26.0 - 34.0 pg   MCHC 34.3  30.0 - 36.0 g/dL   RDW 40.9  81.1 - 91.4 %   Platelets 316  150 - 400 K/uL  BASIC METABOLIC PANEL     Status: Abnormal   Collection Time    10/04/13  3:25 AM      Result Value Range   Sodium 134 (*) 135 - 145 mEq/L   Potassium 3.4 (*) 3.5 - 5.1 mEq/L   Chloride 103  96 - 112 mEq/L   CO2 21  19 - 32 mEq/L   Glucose, Bld 127 (*) 70 - 99 mg/dL   BUN 5 (*) 6 - 23 mg/dL   Creatinine, Ser 7.82  0.50 - 1.10 mg/dL   Calcium 9.5  8.4 - 95.6 mg/dL   GFR calc non Af Amer >90  >90 mL/min   GFR calc Af Amer >90  >90 mL/min   Comment: (NOTE)     The eGFR has been calculated using the CKD EPI equation.     This calculation has not been validated in all clinical situations.     eGFR's persistently <90 mL/min signify possible Chronic Kidney     Disease.  GLUCOSE, CAPILLARY     Status: Abnormal   Collection Time    10/04/13  8:21 AM      Result Value Range   Glucose-Capillary 101 (*) 70 - 99 mg/dL   Comment 1 Documented in Chart     Comment 2 Notify RN      Dg Chest Port 1 View  10/03/2013   CLINICAL DATA:  Vomiting. Aspiration.  EXAM: PORTABLE CHEST - 1 VIEW  COMPARISON:  05/06/2008.  FINDINGS: Low volume chest. Basilar atelectasis. There is no focal consolidation. Based on the low volumes, airspace disease is difficult to exclude. Monitoring leads project over the chest. Mediastinal contours appear within normal limits. No pleural fluid.  IMPRESSION: Low volume chest. No gross consolidation to suggest aspiration pneumonitis. Consider repeat PA and lateral with full inspiration when patient condition permits for better evaluation.   Electronically Signed   By: Andreas Newport M.D.   On: 10/03/2013 19:33    Positive for Polysubstance abuse versus dependence Blood pressure 116/72, pulse  89, temperature 98.1 F (36.7 C), temperature source Oral, resp. rate 24, height 5\' 1"  (1.549 m), weight 66.9 kg (147 lb 7.8 oz), SpO2 99.00%.   Assessment/Plan: Opioid dependence (Heroin) Polysubstance abuse versus dependence  Recommendation:  Case discussed with Dr. Henderson Cloud, MD Patient does not meet criteria for acute psychiatric hospitalization Patient will be referred to the residential substance abuse treatment program Please contact probation officer unconfirmed court-ordered distention substance abuse treatment program which indicated that patient Recommended nor psychotropic medication Appreciate psychiatric consultation and will sign off at this time   Nehemiah Settle., M.D. 10/04/2013, 11:41 AM
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
## 2013-10-05 NOTE — Discharge Summary (Signed)
Physician Discharge Summary  Meghan Bullock:[id number] DOB: 05-May-1991 DOA: 10/03/2013  PCP: Default, Provider, MD  Admit date: 10/03/2013 Discharge date: 10/05/2013  Time spent: 45 minutes  Recommendations for Outpatient Follow-up:  -Patient is medically stable for DC. -Has been cleared for DC by psychiatry. -Police officers are here to take her into custody with a warrant for her arrest.   Discharge Diagnoses:  Principal Problem:   Encephalopathy, toxic Active Problems:   Heroin abuse   Benzodiazepine abuse   Discharge Condition: Stable and improved.  Filed Weights   10/03/13 2000 10/04/13 0500  Weight: 67.3 kg (148 lb 5.9 oz) 66.9 kg (147 lb 7.8 oz)    History of present illness:  Meghan Bullock is a 22 y.o. female with a Past Medical History of heroin and other polysubstance use who presents today with the above noted complaint. Please note that the patient is encephalopathic and is not able to provide any history, as is the most of this history is obtained after talking with the ED physician and the RN. Apparently patient was attempting to break into a house, police was then called who found her to be altered. EMS and brought her here to the emergency room. Because of a known history of polysubstance use, particularly heroin use, toxic encephalopathy was suspected. Patient has responded somewhat to Narcan intravenous pushes. During my evaluation initially, she was barely arousable, however after pushing 2 mg of IV Narcan, patient became much more arousable, mumbles, turned her position in bed, opened eyes looked at me, color her face with a blanket and essentially went back to sleeping.  At this time no further history can be obtained. We were asked to admit her for further evaluation and management.   Hospital Course:   Acute Metabolic Encephalopathy  -Resolved. -Suspect related to heroin/benzo overdose. -Narcan drip was discontinued >24 hours ago. -Patient is  alert and oriented but does not recall events that led up to her hospitalization. (Police report states she was breaking into a house when they were called).  -Has a h/o heroin abuse.  -Has been cleared by pysch for DC: does not need inpatient psychiatric care. -Is medically ready for DC.   Procedures:  None   Consultations:  None  Discharge Instructions  Discharge Orders   Future Orders Complete By Expires   Discontinue IV  As directed        Medication List    STOP taking these medications       amphetamine-dextroamphetamine 30 MG 24 hr capsule  Commonly known as:  ADDERALL XR     diazepam 5 MG tablet  Commonly known as:  VALIUM     ibuprofen 400 MG tablet  Commonly known as:  ADVIL,MOTRIN     ondansetron 8 MG disintegrating tablet  Commonly known as:  ZOFRAN-ODT       Allergies  Allergen Reactions  . Hydrocodone Itching  . Advil [Ibuprofen] Rash    C/O itching with 800mg  IBU, but takes OTC Advil without any problems.  . Tramadol Rash      The results of significant diagnostics from this hospitalization (including imaging, microbiology, ancillary and laboratory) are listed below for reference.    Significant Diagnostic Studies: Dg Chest Port 1 View  10/03/2013   CLINICAL DATA:  Vomiting. Aspiration.  EXAM: PORTABLE CHEST - 1 VIEW  COMPARISON:  05/06/2008.  FINDINGS: Low volume chest. Basilar atelectasis. There is no focal consolidation. Based on the low volumes, airspace disease is difficult to exclude.  Monitoring leads project over the chest. Mediastinal contours appear within normal limits. No pleural fluid.  IMPRESSION: Low volume chest. No gross consolidation to suggest aspiration pneumonitis. Consider repeat PA and lateral with full inspiration when patient condition permits for better evaluation.   Electronically Signed   By: Andreas Newport M.D.   On: 10/03/2013 19:33    Microbiology: Recent Results (from the past 240 hour(s))  MRSA PCR SCREENING      Status: None   Collection Time    10/03/13  7:30 PM      Result Value Range Status   MRSA by PCR NEGATIVE  NEGATIVE Final   Comment:            The GeneXpert MRSA Assay (FDA     approved for NASAL specimens     only), is one component of a     comprehensive MRSA colonization     surveillance program. It is not     intended to diagnose MRSA     infection nor to guide or     monitor treatment for     MRSA infections.     Labs: Basic Metabolic Panel:  Recent Labs Lab 10/03/13 1655 10/03/13 2023 10/04/13 0325  NA 140  --  134*  K 3.5  --  3.4*  CL 102  --  103  CO2  --   --  21  GLUCOSE 92  --  127*  BUN 5*  --  5*  CREATININE 1.00 0.61 0.58  CALCIUM  --   --  9.5   Liver Function Tests: No results found for this basename: AST, ALT, ALKPHOS, BILITOT, PROT, ALBUMIN,  in the last 168 hours No results found for this basename: LIPASE, AMYLASE,  in the last 168 hours No results found for this basename: AMMONIA,  in the last 168 hours CBC:  Recent Labs Lab 10/03/13 1655 10/03/13 1700 10/03/13 2023 10/04/13 0325  WBC  --  12.0* 8.1 10.0  NEUTROABS  --  8.0*  --   --   HGB 12.9 12.4 12.3 12.8  HCT 38.0 36.8 36.5 37.3  MCV  --  84.8 84.5 83.8  PLT  --  308 256 316   Cardiac Enzymes: No results found for this basename: CKTOTAL, CKMB, CKMBINDEX, TROPONINI,  in the last 168 hours BNP: BNP (last 3 results) No results found for this basename: PROBNP,  in the last 8760 hours CBG:  Recent Labs Lab 10/04/13 0821  GLUCAP 101*       Signed:  HERNANDEZ ACOSTA,ESTELA  [facility] Pager: [phone] 10/05/2013, 8:28 AM

## 2013-10-05 NOTE — Progress Notes (Signed)
Belongs return to patient from locker 2.

## 2013-10-05 NOTE — Progress Notes (Signed)
Patient discharge via [facility]. Discharge instruction given understanding verbalized. No additional questions at this time.

## 2013-11-15 ENCOUNTER — Emergency Department: Payer: Self-pay | Admitting: Emergency Medicine

## 2013-11-15 LAB — CBC
MCH: 28.1 pg (ref 26.0–34.0)
Platelet: 241 10*3/uL (ref 150–440)
RBC: 4.54 10*6/uL (ref 3.80–5.20)

## 2013-11-19 ENCOUNTER — Inpatient Hospital Stay: Payer: Self-pay | Admitting: Internal Medicine

## 2013-11-19 ENCOUNTER — Encounter: Payer: Self-pay | Admitting: Family Medicine

## 2013-11-19 LAB — CBC
HCT: 34.6 % — ABNORMAL LOW (ref 35.0–47.0)
HGB: 11.5 g/dL — ABNORMAL LOW (ref 12.0–16.0)
MCH: 28.5 pg (ref 26.0–34.0)
MCHC: 33.3 g/dL (ref 32.0–36.0)
Platelet: 237 10*3/uL (ref 150–440)
RBC: 4.04 10*6/uL (ref 3.80–5.20)
RDW: 16 % — ABNORMAL HIGH (ref 11.5–14.5)
WBC: 8.6 10*3/uL (ref 3.6–11.0)

## 2013-11-19 LAB — HCG, QUANTITATIVE, PREGNANCY: Beta Hcg, Quant.: 61196 m[IU]/mL — ABNORMAL HIGH

## 2013-11-19 LAB — COMPREHENSIVE METABOLIC PANEL
Alkaline Phosphatase: 72 U/L
Anion Gap: 7 (ref 7–16)
Bilirubin,Total: 0.2 mg/dL (ref 0.2–1.0)
Chloride: 105 mmol/L (ref 98–107)
Creatinine: 0.45 mg/dL — ABNORMAL LOW (ref 0.60–1.30)
EGFR (Non-African Amer.): >60
Potassium: 3.2 mmol/L — ABNORMAL LOW (ref 3.5–5.1)
SGPT (ALT): 48 U/L (ref 12–78)
Total Protein: 6.6 g/dL (ref 6.4–8.2)

## 2013-11-20 LAB — CBC WITH DIFFERENTIAL/PLATELET
Basophil %: 0.4 %
HCT: 34.5 % — ABNORMAL LOW (ref 35.0–47.0)
HGB: 11.4 g/dL — ABNORMAL LOW (ref 12.0–16.0)
Lymphocyte #: 1.8 10*3/uL (ref 1.0–3.6)
Lymphocyte %: 23.1 %
MCH: 28.4 pg (ref 26.0–34.0)
MCHC: 33.1 g/dL (ref 32.0–36.0)
Monocyte %: 10 %
Neutrophil #: 5.1 10*3/uL (ref 1.4–6.5)
Platelet: 212 10*3/uL (ref 150–440)
RBC: 4.02 10*6/uL (ref 3.80–5.20)
RDW: 16.2 % — ABNORMAL HIGH (ref 11.5–14.5)
WBC: 7.8 10*3/uL (ref 3.6–11.0)

## 2013-11-21 ENCOUNTER — Emergency Department (HOSPITAL_COMMUNITY): Payer: Medicaid Other

## 2013-11-21 ENCOUNTER — Encounter (HOSPITAL_COMMUNITY): Payer: Self-pay | Admitting: Emergency Medicine

## 2013-11-21 ENCOUNTER — Emergency Department (HOSPITAL_COMMUNITY)
Admission: EM | Admit: 2013-11-21 | Discharge: 2013-11-21 | Disposition: A | Discharge status: Home / Self Care | Payer: Medicaid Other | Attending: Emergency Medicine | Admitting: Emergency Medicine

## 2013-11-21 DIAGNOSIS — Z888 Allergy status to other drugs, medicaments and biological substances status: Secondary | ICD-10-CM | POA: Insufficient documentation

## 2013-11-21 DIAGNOSIS — Z8742 Personal history of other diseases of the female genital tract: Secondary | ICD-10-CM | POA: Insufficient documentation

## 2013-11-21 DIAGNOSIS — Z8619 Personal history of other infectious and parasitic diseases: Secondary | ICD-10-CM | POA: Insufficient documentation

## 2013-11-21 DIAGNOSIS — Z885 Allergy status to narcotic agent status: Secondary | ICD-10-CM | POA: Insufficient documentation

## 2013-11-21 DIAGNOSIS — Y929 Unspecified place or not applicable: Secondary | ICD-10-CM | POA: Insufficient documentation

## 2013-11-21 DIAGNOSIS — F1110 Opioid abuse, uncomplicated: Secondary | ICD-10-CM | POA: Insufficient documentation

## 2013-11-21 DIAGNOSIS — L02619 Cutaneous abscess of unspecified foot: Secondary | ICD-10-CM | POA: Insufficient documentation

## 2013-11-21 DIAGNOSIS — Y939 Activity, unspecified: Secondary | ICD-10-CM | POA: Insufficient documentation

## 2013-11-21 DIAGNOSIS — Z331 Pregnant state, incidental: Secondary | ICD-10-CM | POA: Insufficient documentation

## 2013-11-21 DIAGNOSIS — F319 Bipolar disorder, unspecified: Secondary | ICD-10-CM | POA: Insufficient documentation

## 2013-11-21 DIAGNOSIS — J45909 Unspecified asthma, uncomplicated: Secondary | ICD-10-CM | POA: Insufficient documentation

## 2013-11-21 DIAGNOSIS — R404 Transient alteration of awareness: Secondary | ICD-10-CM | POA: Insufficient documentation

## 2013-11-21 DIAGNOSIS — W540XXA Bitten by dog, initial encounter: Secondary | ICD-10-CM | POA: Insufficient documentation

## 2013-11-21 DIAGNOSIS — F411 Generalized anxiety disorder: Secondary | ICD-10-CM | POA: Insufficient documentation

## 2013-11-21 DIAGNOSIS — L03115 Cellulitis of right lower limb: Secondary | ICD-10-CM

## 2013-11-21 DIAGNOSIS — F17200 Nicotine dependence, unspecified, uncomplicated: Secondary | ICD-10-CM | POA: Insufficient documentation

## 2013-11-21 DIAGNOSIS — R110 Nausea: Secondary | ICD-10-CM | POA: Insufficient documentation

## 2013-11-21 LAB — BASIC METABOLIC PANEL
BUN: 5 mg/dL — ABNORMAL LOW (ref 6–23)
CO2: 27 mEq/L (ref 19–32)
Calcium: 9.4 mg/dL (ref 8.4–10.5)
Chloride: 99 mEq/L (ref 96–112)
Creatinine, Ser: 0.45 mg/dL — ABNORMAL LOW (ref 0.50–1.10)
GFR calc Af Amer: >90 mL/min (ref >90)
GFR calc non Af Amer: >90 mL/min (ref >90)
Glucose, Bld: 79 mg/dL (ref 70–99)
Potassium: 3.4 mEq/L — ABNORMAL LOW (ref 3.5–5.1)
Sodium: 136 mEq/L (ref 135–145)

## 2013-11-21 LAB — CBC
HCT: 36.4 % (ref 36.0–46.0)
Hemoglobin: 12.3 g/dL (ref 12.0–15.0)
MCH: 29.6 pg (ref 26.0–34.0)
MCHC: 33.8 g/dL (ref 30.0–36.0)
MCV: 87.5 fL (ref 78.0–100.0)
Platelets: 233 10*3/uL (ref 150–400)
RBC: 4.16 MIL/uL (ref 3.87–5.11)
RDW: 15.2 % (ref 11.5–15.5)
WBC: 10 10*3/uL (ref 4.0–10.5)

## 2013-11-21 MED ORDER — PIPERACILLIN-TAZOBACTAM 3.375 G IVPB
3.3750 g | Freq: Once | INTRAVENOUS | Status: AC
  Administered 2013-11-21: 3.375 g via INTRAVENOUS
  Filled 2013-11-21: qty 50

## 2013-11-21 NOTE — ED Provider Notes (Signed)
CSN: [id number]     Arrival date & time 11/21/13  1225 History   First MD Initiated Contact with Patient 11/21/13 1237     Chief Complaint  Patient presents with  . Foot Pain   (Consider location/radiation/quality/duration/timing/severity/associated sxs/prior Treatment) HPI Pt is a [place] female with hx of bipolar disorder, heroin abuse and currently 11 weeks, 6 days pregnant c/o worsening right foot pain, redness, and swelling. Pt states last Thursday, 12/18, she was bitten by her dog.  She was initially seen at [facility] and discharged from ED with Augmentin.  She was seen again on Tuesday, 12/23 where she was admitted for IV antibiotics for cellulitis in her right foot from the dog bite, discharged yesterday afternoon.  Pt states a few hours after being discharged, she noticed increased pain, redness, and swelling.  Reports she is still taking PO augmentin as prescribed.  Reports nausea but denies fever, vomiting or diarrhea.   Past Medical History  Diagnosis Date  . Mononucleosis   . Ovarian cyst   . Chlamydia   . Bipolar 1 disorder   . Anxiety   . Asthma   . Heroin abuse    Past Surgical History  Procedure Laterality Date  . No past surgeries     History reviewed. No pertinent family history. History  Substance Use Topics  . Smoking status: Current Every Day Smoker -- 1.00 packs/day for 5 years    Types: Cigarettes  . Smokeless tobacco: Not on file  . Alcohol Use: No   OB History   Grav Para Term Preterm Abortions TAB SAB Ect Mult Living   3    1  1         Review of Systems  Musculoskeletal: Positive for arthralgias, joint swelling and myalgias.  Skin: Positive for color change, rash and wound.       Right foot  All other systems reviewed and are negative.    Allergies  Hydrocodone; Advil; and Tramadol  Home Medications   Current Outpatient Rx  Name  Route  Sig  Dispense  Refill  . amoxicillin-clavulanate (AUGMENTIN) 875-125 MG per tablet   Oral   Take 1  tablet by mouth 2 (two) times daily.          BP 108/55  Pulse 80  Temp(Src) 97.9 F (36.6 C) (Oral)  Resp 16  SpO2 100%  LMP 06/27/2013 Physical Exam  Nursing note and vitals reviewed. Constitutional: She is oriented to person, place, and time. She appears well-developed and well-nourished. No distress.  Pt appears drowsy.  HENT:  Head: Normocephalic and atraumatic.  Eyes: Conjunctivae are normal. No scleral icterus.  Neck: Normal range of motion.  Cardiovascular: Normal rate, regular rhythm and normal heart sounds.   Pulmonary/Chest: Effort normal and breath sounds normal. No respiratory distress. She has no wheezes. She has no rales. She exhibits no tenderness.  Abdominal: Soft. Bowel sounds are normal. She exhibits no distension and no mass. There is no tenderness. There is no rebound and no guarding.  Musculoskeletal: Normal range of motion. She exhibits edema and tenderness.  Right foot: moderate edema with erythema and warmth. tender to palpation. 3 scabbed over puncture wounds. No discharge.  Neurological: She is alert and oriented to person, place, and time.  Skin: Skin is warm and dry. She is not diaphoretic.    ED Course  Procedures (including critical care time) Labs Review Labs Reviewed  BASIC METABOLIC PANEL - Abnormal; Notable for the following:    Potassium 3.4 (*)  BUN 5 (*)    Creatinine, Ser 0.45 (*)    All other components within normal limits  CBC   Imaging Review Dg Foot Complete Right  11/21/2013   CLINICAL DATA:  Swelling, redness  EXAM: RIGHT FOOT COMPLETE - 3+ VIEW  COMPARISON:  08/29/2007  FINDINGS: Diffuse dorsal foot soft tissue swelling. No radiopaque foreign body. Normal alignment without displaced fracture. Preserved joint spaces. No significant arthropathy. No radiopaque foreign body.  IMPRESSION: Soft tissue swelling without acute osseous finding   Electronically Signed   By: Ruel Favors M.D.   On: 11/21/2013 13:32    EKG  Interpretation   None       MDM   1. Cellulitis of right foot    Pt presenting with cellulitis of right foot after being discharged from [facility] yesterday for same.  Pt received IV antibiotics at that time, and according to discharge papers, she was given Unasyn, Percocet and tylenol.  Pt was also seen by Dr. Jean Rosenthal, OB/GYN during her stay.  Today, pt appears drowsy. Pt states she did not get much sleep last night due to the pain, however pt appears clinically intoxicated but does not appear septic.  Vitals: unremarkable, pt is afebrile.  Pt has hx of heroin abuse, was seen in ED on 10/03/13 for a drug overdose.  Hesitant to give pt narcotics for pain control.   Discussed pt with Dr. Silverio Lay. Reviewed discharge summary from [place].  CBC and BMP ordered: unremarkable.  Plain films right foot: soft tissue swelling, without acute osseous findings.    Unasyn IV given in ED. All labs/imaging/findings discussed with patient. All questions answered and concerns addressed. Pt requesting percocet multiple times for discharge. Due to pt's hx of drug abuse, recent drug overdose, pt is pregnant, advised pt she may take acetaminophen as needed for pain and to complete entire course of Augmentin as prescribed. Encouraged pt to establish PCP for f/u for pain. Will discharge pt home and have pt f/u with [facility] info provided. Return precautions given. Pt verbalized understanding and agreement with tx plan. Vitals: unremarkable. Discharged in stable condition.    Discussed pt with attending during ED encounter and agrees with plan.     Junius Finner, PA-C 11/21/13 [phone]

## 2013-11-21 NOTE — ED Notes (Signed)
Pt reports to the ED for eval of right foot pain. Pt was bitten by a pit bull last Thursday and was seen Friday and sent home with abx. Symptoms did not resolve and pt returned to the hospital and was admitted and received IV abx. Pt describes the pain as throbbing. Swelling, erythema, warmth, and tenderness noted at the site. CMS and full ROM intact. Pt lethargic but oriented x 4. Reports she did not sleep very much last pm. Pt is also 11 weeks and 6 days pregnant. Pt denies any fevers, chills, tingling, or numbness. Pt in NAD.

## 2013-11-21 NOTE — ED Provider Notes (Signed)
Medical screening examination/treatment/procedure(s) were performed by non-physician practitioner and as supervising physician I was immediately available for consultation/collaboration.  EKG Interpretation   None         Tanzie Rothschild H Jaryn Hocutt, MD 11/21/13 1538 

## 2013-11-28 NOTE — L&D Delivery Note (Signed)
**Note De-Identified Jaimeson Gopal Obfuscation** Operative Delivery Note At 4:15 PM a viable and healthy female was delivered Meghan Bullock .  Presentation: cephalic; Position: Left,, Occiput,, Anterior; Station: +3.  Delivery of the head: Spontaneous  ,   Second maneuver: McRoberts'  Third maneuver: Suprapubic pressure  Fourth maneuver: Rubin 2.   There were 60 seconds between delivery of the head until delivery of the body.   APGAR: 7, 8; weight: pending .   Placenta status: spontaneous, intact  Cord: 3 vessels with the following complications: .  Cord pH: pending   Anesthesia: Epidural  Episiotomy: n/a Lacerations: 1st degree, hemostatic  Suture Repair: n/a  Est. Blood Loss (mL): 300  Mom to postpartum.  Baby to Couplet care / Skin to Skin.  Pt pushed with good maternal effort to deliver a liveborn female Meghan Bullock NSVD with spontaneous cry.  Presented with shoulder dystocia.  Baby was delivered with third techniqued tried [name](Rubin 2) with 60 seconds between head delivery and body.  Baby was taken to warmer.  Cord cut by provider.  Placenta delivered intact with 3V cord [name] traction and pitocin.  1st degre tear. No complications.  Mom and baby to postpartum.   [name], [name] 05/13/2014, 4:26 PM  I was present for the delivery and shoulder dystocia, I performed maneuvers and agree with resident's note and plan of care.  Tawana ScaleMichael Ryan Odom, MD OB Fellow 05/13/2014 7:42 PM

## 2014-01-01 ENCOUNTER — Emergency Department (HOSPITAL_COMMUNITY)
Admission: EM | Admit: 2014-01-01 | Discharge: 2014-01-01 | Discharge status: Against Medical Advice | Payer: Medicaid Other | Attending: Emergency Medicine | Admitting: Emergency Medicine

## 2014-01-01 ENCOUNTER — Encounter (HOSPITAL_COMMUNITY): Payer: Self-pay | Admitting: Emergency Medicine

## 2014-01-01 DIAGNOSIS — R5381 Other malaise: Secondary | ICD-10-CM | POA: Insufficient documentation

## 2014-01-01 DIAGNOSIS — J45909 Unspecified asthma, uncomplicated: Secondary | ICD-10-CM | POA: Insufficient documentation

## 2014-01-01 DIAGNOSIS — R5383 Other fatigue: Secondary | ICD-10-CM

## 2014-01-01 DIAGNOSIS — O99340 Other mental disorders complicating pregnancy, unspecified trimester: Secondary | ICD-10-CM | POA: Insufficient documentation

## 2014-01-01 DIAGNOSIS — O99330 Smoking (tobacco) complicating pregnancy, unspecified trimester: Secondary | ICD-10-CM | POA: Insufficient documentation

## 2014-01-01 DIAGNOSIS — O9989 Other specified diseases and conditions complicating pregnancy, childbirth and the puerperium: Secondary | ICD-10-CM | POA: Insufficient documentation

## 2014-01-01 DIAGNOSIS — M7989 Other specified soft tissue disorders: Secondary | ICD-10-CM | POA: Insufficient documentation

## 2014-01-01 DIAGNOSIS — F1110 Opioid abuse, uncomplicated: Secondary | ICD-10-CM | POA: Insufficient documentation

## 2014-01-01 DIAGNOSIS — M79609 Pain in unspecified limb: Secondary | ICD-10-CM | POA: Insufficient documentation

## 2014-01-01 NOTE — ED Notes (Signed)
Per pt, wants detox from heroin-wants detox tomorrow after her court date-last use a hour ago

## 2014-01-02 ENCOUNTER — Encounter (HOSPITAL_COMMUNITY): Payer: Self-pay | Admitting: Emergency Medicine

## 2014-01-02 ENCOUNTER — Emergency Department (HOSPITAL_COMMUNITY)
Admission: EM | Admit: 2014-01-02 | Discharge: 2014-01-02 | Discharge status: Against Medical Advice | Payer: Medicaid Other | Attending: Emergency Medicine | Admitting: Emergency Medicine

## 2014-01-02 DIAGNOSIS — F17200 Nicotine dependence, unspecified, uncomplicated: Secondary | ICD-10-CM | POA: Insufficient documentation

## 2014-01-02 DIAGNOSIS — J45909 Unspecified asthma, uncomplicated: Secondary | ICD-10-CM | POA: Insufficient documentation

## 2014-01-02 DIAGNOSIS — F1110 Opioid abuse, uncomplicated: Secondary | ICD-10-CM | POA: Insufficient documentation

## 2014-01-02 LAB — RAPID URINE DRUG SCREEN, HOSP PERFORMED
Amphetamines: NOT DETECTED
Barbiturates: NOT DETECTED
Benzodiazepines: NOT DETECTED
COCAINE: POSITIVE — AB
Opiates: POSITIVE — AB
Tetrahydrocannabinol: POSITIVE — AB

## 2014-01-02 LAB — POCT PREGNANCY, URINE: Preg Test, Ur: POSITIVE — AB

## 2014-01-02 NOTE — ED Provider Notes (Signed)
Patient left without before seen after triage. This provider did not interview, assess, and/or examine the patient due to the patient leaving before being seen by a provider. This provider did not partake in any medical decision making for this patient.   Raymon MuttonMarissa Kiko Ripp, PA-C 01/03/14 1456

## 2014-01-02 NOTE — ED Notes (Signed)
Per pt, here for detox from heroine.  Pt has detoxed before a couple of years ago for pills.  Last heroine use this am.  Pt lives with grandmother.

## 2014-01-02 NOTE — ED Notes (Signed)
Pt not found in room.  Family member also gone.  Waited for 30-45 minutes with no return.

## 2014-01-03 ENCOUNTER — Emergency Department: Payer: Self-pay | Admitting: Emergency Medicine

## 2014-01-03 ENCOUNTER — Emergency Department (HOSPITAL_COMMUNITY)
Admission: EM | Admit: 2014-01-03 | Discharge: 2014-01-03 | Disposition: A | Discharge status: Home / Self Care | Payer: Medicaid Other | Attending: Emergency Medicine | Admitting: Emergency Medicine

## 2014-01-03 ENCOUNTER — Encounter (HOSPITAL_COMMUNITY): Payer: Self-pay | Admitting: Emergency Medicine

## 2014-01-03 DIAGNOSIS — Z8709 Personal history of other diseases of the respiratory system: Secondary | ICD-10-CM | POA: Insufficient documentation

## 2014-01-03 DIAGNOSIS — Z8659 Personal history of other mental and behavioral disorders: Secondary | ICD-10-CM | POA: Insufficient documentation

## 2014-01-03 DIAGNOSIS — Z8619 Personal history of other infectious and parasitic diseases: Secondary | ICD-10-CM | POA: Insufficient documentation

## 2014-01-03 DIAGNOSIS — O9989 Other specified diseases and conditions complicating pregnancy, childbirth and the puerperium: Secondary | ICD-10-CM | POA: Insufficient documentation

## 2014-01-03 DIAGNOSIS — F1110 Opioid abuse, uncomplicated: Secondary | ICD-10-CM | POA: Insufficient documentation

## 2014-01-03 DIAGNOSIS — Z3490 Encounter for supervision of normal pregnancy, unspecified, unspecified trimester: Secondary | ICD-10-CM

## 2014-01-03 DIAGNOSIS — Z8742 Personal history of other diseases of the female genital tract: Secondary | ICD-10-CM | POA: Insufficient documentation

## 2014-01-03 DIAGNOSIS — O99330 Smoking (tobacco) complicating pregnancy, unspecified trimester: Secondary | ICD-10-CM | POA: Insufficient documentation

## 2014-01-03 DIAGNOSIS — J45909 Unspecified asthma, uncomplicated: Secondary | ICD-10-CM | POA: Insufficient documentation

## 2014-01-03 LAB — URINALYSIS, ROUTINE W REFLEX MICROSCOPIC
Bilirubin Urine: NEGATIVE
Glucose, UA: NEGATIVE mg/dL
Hgb urine dipstick: NEGATIVE
KETONES UR: NEGATIVE mg/dL
LEUKOCYTES UA: NEGATIVE
NITRITE: NEGATIVE
Protein, ur: NEGATIVE mg/dL
SPECIFIC GRAVITY, URINE: 1.023 (ref 1.005–1.030)
Urobilinogen, UA: 1 mg/dL (ref 0.0–1.0)
pH: 6.5 (ref 5.0–8.0)

## 2014-01-03 LAB — COMPREHENSIVE METABOLIC PANEL
ALBUMIN: 2.9 g/dL — AB (ref 3.5–5.2)
ALT: 32 U/L (ref 12–78)
ALT: 22 U/L (ref 0–35)
ANION GAP: 5 — AB (ref 7–16)
AST: 17 U/L (ref 0–37)
Albumin: 3.4 g/dL (ref 3.4–5.0)
Alkaline Phosphatase: 102 U/L
Alkaline Phosphatase: 80 U/L (ref 39–117)
BUN: 6 mg/dL — ABNORMAL LOW (ref 7–18)
BUN: 6 mg/dL (ref 6–23)
Bilirubin,Total: 0.4 mg/dL (ref 0.2–1.0)
CALCIUM: 9.1 mg/dL (ref 8.4–10.5)
CHLORIDE: 96 meq/L (ref 96–112)
CO2: 23 mEq/L (ref 19–32)
Calcium, Total: 9.3 mg/dL (ref 8.5–10.1)
Chloride: 97 mmol/L — ABNORMAL LOW (ref 98–107)
Co2: 30 mmol/L (ref 21–32)
Creatinine, Ser: 0.49 mg/dL — ABNORMAL LOW (ref 0.50–1.10)
Creatinine: 0.67 mg/dL (ref 0.60–1.30)
EGFR (African American): >60
EGFR (Non-African Amer.): >60
GFR calc Af Amer: >90 mL/min (ref >90)
GFR calc non Af Amer: >90 mL/min (ref >90)
GLUCOSE: 153 mg/dL — AB (ref 65–99)
Glucose, Bld: 88 mg/dL (ref 70–99)
OSMOLALITY: 265 (ref 275–301)
POTASSIUM: 3.3 mmol/L — AB (ref 3.5–5.1)
Potassium: 3.6 mEq/L — ABNORMAL LOW (ref 3.7–5.3)
SGOT(AST): 21 U/L (ref 15–37)
SODIUM: 132 mmol/L — AB (ref 136–145)
Sodium: 135 mEq/L — ABNORMAL LOW (ref 137–147)
Total Bilirubin: 0.3 mg/dL (ref 0.3–1.2)
Total Protein: 8.1 g/dL (ref 6.4–8.2)
Total Protein: 7.1 g/dL (ref 6.0–8.3)

## 2014-01-03 LAB — CBC
HCT: 38.4 % (ref 35.0–47.0)
HCT: 34.6 % — ABNORMAL LOW (ref 36.0–46.0)
HGB: 13 g/dL (ref 12.0–16.0)
Hemoglobin: 11.6 g/dL — ABNORMAL LOW (ref 12.0–15.0)
MCH: 29.6 pg (ref 26.0–34.0)
MCH: 29.2 pg (ref 26.0–34.0)
MCHC: 33.9 g/dL (ref 32.0–36.0)
MCHC: 33.5 g/dL (ref 30.0–36.0)
MCV: 88 fL (ref 80–100)
MCV: 87.2 fL (ref 78.0–100.0)
Platelet: 247 10*3/uL (ref 150–440)
Platelets: 243 10*3/uL (ref 150–400)
RBC: 4.39 10*6/uL (ref 3.80–5.20)
RBC: 3.97 MIL/uL (ref 3.87–5.11)
RDW: 14.7 % — ABNORMAL HIGH (ref 11.5–14.5)
RDW: 14.2 % (ref 11.5–15.5)
WBC: 13.8 10*3/uL — AB (ref 3.6–11.0)
WBC: 10.9 10*3/uL — AB (ref 4.0–10.5)

## 2014-01-03 LAB — URINALYSIS, COMPLETE
Bilirubin,UR: NEGATIVE
Blood: NEGATIVE
GLUCOSE, UR: NEGATIVE mg/dL (ref 0–75)
KETONE: NEGATIVE
LEUKOCYTE ESTERASE: NEGATIVE
NITRITE: NEGATIVE
Ph: 6 (ref 4.5–8.0)
Specific Gravity: 1.02 (ref 1.003–1.030)

## 2014-01-03 LAB — ETHANOL
Alcohol, Ethyl (B): <11 mg/dL (ref 0–11)
Ethanol %: <0.003 % (ref 0.000–0.080)
Ethanol: <3 mg/dL

## 2014-01-03 LAB — RAPID URINE DRUG SCREEN, HOSP PERFORMED
AMPHETAMINES: NOT DETECTED
Barbiturates: NOT DETECTED
Benzodiazepines: NOT DETECTED
COCAINE: NOT DETECTED
OPIATES: POSITIVE — AB
Tetrahydrocannabinol: POSITIVE — AB

## 2014-01-03 LAB — DRUG SCREEN, URINE
Amphetamines, Ur Screen: NEGATIVE (ref ?–1000)
Barbiturates, Ur Screen: NEGATIVE (ref ?–200)
Benzodiazepine, Ur Scrn: NEGATIVE (ref ?–200)
CANNABINOID 50 NG, UR ~~LOC~~: POSITIVE (ref ?–50)
COCAINE METABOLITE, UR ~~LOC~~: NEGATIVE (ref ?–300)
MDMA (ECSTASY) UR SCREEN: NEGATIVE (ref ?–500)
METHADONE, UR SCREEN: NEGATIVE (ref ?–300)
OPIATE, UR SCREEN: POSITIVE (ref ?–300)
PHENCYCLIDINE (PCP) UR S: NEGATIVE (ref ?–25)
Tricyclic, Ur Screen: NEGATIVE (ref ?–1000)

## 2014-01-03 LAB — ACETAMINOPHEN LEVEL: Acetaminophen (Tylenol), Serum: <15 ug/mL (ref 10–30)

## 2014-01-03 LAB — POCT PREGNANCY, URINE: Preg Test, Ur: POSITIVE — AB

## 2014-01-03 LAB — SALICYLATE LEVEL: Salicylate Lvl: <2 mg/dL — ABNORMAL LOW (ref 2.8–20.0)

## 2014-01-03 LAB — PREGNANCY, URINE: Preg Test, Ur: POSITIVE — AB

## 2014-01-03 NOTE — ED Notes (Addendum)
Requests detox from heroin.  Pt reports being [redacted] weeks pregnant.  Last used heroin this morning.  Denies any other drugs or etoh.  Denies suicidal/homicidal ideation.

## 2014-01-03 NOTE — ED Provider Notes (Signed)
CSN: [id number]     Arrival date & time 01/03/14  1914 History   First MD Initiated Contact with Patient 01/03/14 1923     Chief Complaint  Patient presents with  . heroin detox- pregnant    (Consider location/radiation/quality/duration/timing/severity/associated sxs/prior Treatment) HPI Comments: 23 yo G3 P0 (MC x 2) at [redacted] weeks ega presents with cc of requesting detox for heroin.    All: hydrocodone, advil, tramadol - minimal itching Meds: none PMH: Heroin addiction for 2 years PSH: none SHx: single, monogamous with one person for 2 years; father of baby is involved, he is in jail; Pt denies other substances except for heroin.  Smokes cigarettes. Denies EtOH.    No vb, vd, abd pain, n/v/d, f/u/d.    Pt has tried to stop heroin use, but can't do it herself.    Pt called [facility] [place], [place] and per report she can show up in the AM, but needs a referral.    The history is provided by the patient and a relative (grandmonther).    Past Medical History  Diagnosis Date  . Mononucleosis   . Ovarian cyst   . Chlamydia   . Bipolar 1 disorder   . Anxiety   . Asthma   . Heroin abuse    Past Surgical History  Procedure Laterality Date  . No past surgeries     No family history on file. History  Substance Use Topics  . Smoking status: Current Every Day Smoker -- 1.00 packs/day for 5 years    Types: Cigarettes  . Smokeless tobacco: Not on file  . Alcohol Use: No   OB History   Grav Para Term Preterm Abortions TAB SAB Ect Mult Living   3    1  1         Review of Systems  Constitutional: Negative.   HENT: Negative.   Eyes: Negative.   Respiratory: Negative.   Cardiovascular: Negative.   Gastrointestinal: Negative for nausea, abdominal pain, diarrhea, constipation, blood in stool and anal bleeding.  Endocrine: Negative.   Genitourinary: Negative.   Musculoskeletal: Negative.   Neurological: Negative.   Hematological: Negative.    Psychiatric/Behavioral: Negative.        Heroin addiction.      Allergies  Hydrocodone; Advil; and Tramadol  Home Medications  No current outpatient prescriptions on file. BP 124/78  Pulse 107  Temp(Src) 98 F (36.7 C) (Oral)  Resp 18  Wt 148 lb 5 oz (67.274 kg)  SpO2 99%  LMP 10/10/2013 Physical Exam  Nursing note and vitals reviewed. Constitutional: She is oriented to person, place, and time. She appears well-developed and well-nourished.  HENT:  Head: Normocephalic and atraumatic.  Nose: Nose normal.  Mouth/Throat: Oropharynx is clear and moist. No oropharyngeal exudate.  Eyes: Conjunctivae are normal. Right eye exhibits no discharge. Left eye exhibits no discharge.  Neck: Normal range of motion. Neck supple. No JVD present.  Cardiovascular: Normal rate and regular rhythm.   Pulmonary/Chest: Effort normal and breath sounds normal. No stridor.  Abdominal: Soft. Bowel sounds are normal. She exhibits no distension and no mass. There is no tenderness. There is no rebound.  Musculoskeletal: Normal range of motion. She exhibits no edema and no tenderness.  Neurological: She is alert and oriented to person, place, and time. She has normal reflexes. She displays normal reflexes. She exhibits normal muscle tone.  Skin: Skin is warm and dry.   No pelvic exam b/c pt has no gyn symptoms nor  pelvic/abd pain.    ED Course  Procedures (including critical care time) Labs Review Labs Reviewed  ACETAMINOPHEN LEVEL  CBC  COMPREHENSIVE METABOLIC PANEL  ETHANOL  SALICYLATE LEVEL  URINE RAPID DRUG SCREEN (HOSP PERFORMED)   Imaging Review No results found.  EKG Interpretation   None      EMERGENCY DEPARTMENT US PREGNANCY "Study: Limited Ultrasound of the Pelvis"  INDICATIONS:Pregnancy(required) Multiple views of the uterus and pelvic cavity are obtained with a multi-frequency probe.  APPROACH:Transabdominal   PERFORMED BY: Myself  IMAGES ARCHIVED?: Yes  LIMITATIONS:  None  PREGNANCY FREE FLUID: None  PREGNANCY UTERUS FINDINGS:Uterus enlarged ADNEXAL FINDINGS:Left ovary not seen and Right ovary not seen  PREGNANCY FINDINGS: Fetal heart activity seen, FM seen  INTERPRETATION: Viable intrauterine pregnancy  GESTATIONAL AGE, ESTIMATE: 17   FETAL HEART RATE: 140s  COMMENT(Estimate of Gestational Age):  17 weeks    MDM  No diagnosis found.  Diagnosis: Heroin Abuse Pregnancy first Trimester Medically cleared   23 year old G3 P0 at 4617 weeks estimated gestational age presents to the emergency department with chief complaint of requesting heroine detox. Patient states she's been using heroin for approximately 2 years and cannot quit on her own. Patient also states that she has spoken to personnel [facility] [place] and they stated that she needs a referral and she can be admitted there for inpatient detox. Of note the patient is without complaint of fever, chills, chest pain, shortness of breath, abdominal pain, nausea or diarrhea, vaginal bleeding, vaginal discharge.  ER workup was negative. Labwork unremarkable except for a positive urine pregnancy test and a urine drug screen positive for opiates and THC. Her vital signs are stable she is afebrile. Bedside OB ultrasound revealed a viable pregnancy with cardiac activity and fetal movement.  Lengthy discussion with patient and her grandmother regarding best disposition. I attempted to contact the admission personnel at [facility] however the admission personnel will not be present until tomorrow morning. I also spoke with Ava with TTS and she will evaluate the patient.  Plan for patient to stay in the emergency department until tomorrow morning when possible coordination for inpatient management can take place.  Patient is medically clear at this time.  9:44 PM Spoke to TTS Ava, [facility] since pt is not suicidal or  homicidal at this time    11:06 PM Spoke to Delila with TTS.  Plan for hold overnight and attempt to coordinate transfer in the morning.  Patient states she has an ankle monitor that needs to be charged and she must go home. She is aware of the efforts that have been made to attempt to coordinate a transfer, however she is now willing to stay this evening. She does plan to return in the morning with the hope of obtaining placement in a detox facility at that time. The patient is cogent, logical, and without signs of intoxication. Her grandmother is at the bedside and both agree with this plan. ER precautions were given and the patient is aware she may return at any time.  She has been counseled regarding the potential injury and heroin abuse during pregnancy.  Darlys Galesavid Makailah Slavick, MD 01/03/14 [phone]

## 2014-01-03 NOTE — Progress Notes (Signed)
Writer spoke to the ER MD Erick Alley(David Maneri) and obtained clinical information regarding the patient.  The ER MD reports that [name] [place]  ER MD reports that the patient needs some type of  referral in order to go there.   The unit secretary  Contacted the facility and their intake department will not be open tomorrow morning .    Writer informed the [facility] to refer the patient to other facilities since [name] is not able to accept the referral until the intake department comes opens tomorrow at [employer]am  TTS counselor [facility](Deliaha) will assess the patient.

## 2014-01-03 NOTE — ED Notes (Signed)
Pt given paper scrubs to change.  Security called to wand pt.

## 2014-01-03 NOTE — Discharge Instructions (Signed)
Opiate Dependence °The above names are all different names used for opiates. Opiates are any medication made from the poppy plant. It is a medication which produces a calming, sleepy effect. Because achieving this effect requires more and more of this drug to get the same result, opiates become addictive. A family history of addiction or an addictive personality increases the risk. °When drug use is interfering with normal living activities, it has become abuse. This includes problems with family, friends, and your job. Psychological dependence has developed when your mind tells you that the drug is needed. This emotional dependence is the craving for the "high" that some drugs cause. Emotional addicts always want this high instead of the way they are feeling when not using the drug. This is difficult to overcome. °This is usually followed by physical dependence that has developed when continuing increases of drug are required to get the same feeling or "high". This is known as addiction or chemical dependency.  °SIGNS OF CHEMICAL DEPENDENCY °· Friends and family tell you there is a problem. °· Fighting when using drugs. °· Mood swings and insomnia. °· Forgetfulness. °· Not remembering what you do while using (blackouts). °· Feeling sick from using drugs but you continue using. °· Lie about use or amounts of drugs (chemicals) used. °· Need chemicals to get you going. °· Suffer in work performance or school because of drug use. °· Need drugs to relate to people or feel comfortable in social situations. °· Use drugs to forget problems. °· Difficulty with attention. °· Neglecting obligations. °If you answered "yes" to any or some of the above signs of chemical dependency, you may have a problem. The longer the use of drugs continues, the greater the problems will become. Do not experiment with drugs.  °SIGNS AND SYMPTOMS OF PHYSICAL DEPENDENCE  °Sudden stopping of the narcotic is uncomfortable when tolerance has  developed. Physical problems will develop. This is called withdrawal.  °How bad the withdrawal is varies from person to person. Some of the smaller problems are: °· Tremors in the hands or shakes and jitters. °· A fast heart rate and rapid breathing. °· An increase in temperature. °· Anxiety and panic attacks with bad dreams. °· Muscle aches and pains. °You may have more serious problems. These can include: °· Feeling sick to your stomach or throwing up. °· Dehydration develops if you cannot keep fluids down. °· Tremors and chills or fever with sweating and anxiety. °· Hallucinations and cravings. °· Body aching with restlessness and insomnia. °· Seizures or convulsions. °These problems can last for months. These uncomfortable feeling can cause you to use drugs again just to feel better. °OTHER HEALTH RISKS OF NARCOTICS USE INCLUDE: °· The increased possibility of getting AIDS, hepatitis, other infectious or sexually transmitted diseases. °· Unplanned pregnancy and having a baby born addicted to narcotics. You then put your baby through painful withdrawal symptoms including: shaking, jerking, and crying in pain. Many babies die. Other babies have lifelong disabilities and learning problems. °TREATMENT  °Effective treatment and management of narcotic addiction requires a multi-faceted, team approach that includes: °· Medications to minimize the symptoms of narcotic withdrawal. °· Medications to reduce the need for continued narcotic use. °· Medical management of unrelated medical problems. °· Pain management. °· Social services. °· Psychological treatment. °· Behavioral therapies. °Stopping your dependence is hard but may save your life. If you continue using drugs, the only possible outcome is loss of self respect and esteem, violence, and eventually prison or   death. To stop abuse, you must first realize you have a problem. You control your behavior. Once you realize this, commit to quitting. Addiction is a disease.  You need medical help to get well. Your caregiver can counsel you or refer you for counseling. The best way to do this is to seek out an organization for help. These include Alcoholics Anonymous, Narcotics Anonymous, or the [employer] on Alcoholism and Drug Dependence. HOW TO STAY CLEAN WHEN YOU HAVE QUIT USING  Develop healthy activities.  Form friendships with those who do not use drugs.  Stay away from all drugs. Alcohol will lessen your ability to say no.  Have ready excuses available about why you cannot use. If that is difficult, stay away from people who knew you used. HOME CARE INSTRUCTIONS   Both prescription medicines and over-the-counter medicines are used as part of the treatment. It is critical to follow the recommendations of your caregiver at home. Any additional over-the-counter medications or changes in the recommended treatment plan should be discussed with your caregiver first.  It is important to keep fluids down. Juices, soda, Gatorade, or a mixture will help prevent dehydration.  Be prepared for the emotional swings of quitting.  Call your local emergency services if seizures (convulsions) occur or if you are unable to keep liquids down.  Keep a written record of medications you take and times given. Overcoming addictions takes years. Over time you will have a lessening of the craving for narcotics. Talk to your caregiver or a member of your support group if you need more help.  Addiction cannot be cured but it can be stopped. Treatment centers are listed in the yellow pages under: Cocaine, Narcotics, and Alcoholics Anonymous. Most hospitals and clinics can refer you to a specialized care center. Document Released: 09/11/2007 Document Revised: 02/06/2012 Document Reviewed: 09/11/2007 [facility] Patient Information 2014 [place].  Pregnancy If you are planning on getting pregnant, it is a good idea to make a preconception appointment with your caregiver to  discuss having a healthy lifestyle before getting pregnant. This includes diet, weight, exercise, taking prenatal vitamins (especially folic acid, which helps prevent brain and spinal cord defects), avoiding alcohol, smoking and illegal drugs, medical problems (diabetes, convulsions), family history of genetic problems, working conditions, and immunizations. It is better to have knowledge of these things and do something about them before getting pregnant. During your pregnancy, it is important to follow certain guidelines in order to have a healthy baby. It is very important to get good prenatal care and follow your caregiver's instructions. Prenatal care includes all the medical care you receive before your baby's birth. This helps to prevent problems during the pregnancy and childbirth. HOME CARE INSTRUCTIONS   Start your prenatal visits by the 12th week of pregnancy or earlier, if possible. At first, appointments are usually scheduled monthly. They become more frequent in the last 2 months before delivery. It is important that you keep your caregiver's appointments and follow your caregiver's instructions regarding medication use, exercise, and diet.  During pregnancy, you are providing food for you and your baby. Eat a regular, well-balanced diet. Choose foods such as meat, fish, milk and other dairy products, vegetables, fruits, whole-grain breads and cereals. Your caregiver will inform you of the ideal weight gain depending on your current height and weight. Drink lots of liquids. Try to drink 8 glasses of water a day.  Alcohol is associated with a number of birth defects including fetal alcohol syndrome. It is  best to avoid alcohol completely. Smoking will cause low birth rate and prematurity. Use of alcohol and nicotine during your pregnancy also increases the chances that your child will be chemically dependent later in their life and may contribute to SIDS (Sudden Infant Death Syndrome).  Do not  use illegal drugs.  Only take prescription or over-the-counter medications that are recommended by your caregiver. Other medications can cause genetic and physical problems in the baby.  Morning sickness can often be helped by keeping soda crackers at the bedside. Eat a few before getting up in the morning.  A sexual relationship may be continued until near the end of pregnancy if there are no other problems such as early (premature) leaking of amniotic fluid from the membranes, vaginal bleeding, painful intercourse or belly (abdominal) pain.  Exercise regularly. Check with your caregiver if you are unsure of the safety of some of your exercises.  Do not use hot tubs, steam rooms or saunas. These increase the risk of fainting and hurting yourself and the baby. Swimming is OK for exercise. Get plenty of rest, including afternoon naps when possible, especially in the third trimester.  Avoid toxic odors and chemicals.  Do not wear high heels. They may cause you to lose your balance and fall.  Do not lift over 5 pounds. If you do lift anything, lift with your legs and thighs, not your back.  Avoid long trips, especially in the third trimester.  If you have to travel out of the city or state, take a copy of your medical records with you. SEEK IMMEDIATE MEDICAL CARE IF:   You develop an unexplained oral temperature above 102 F (38.9 C), or as your caregiver suggests.  You have leaking of fluid from the vagina. If leaking membranes are suspected, take your temperature and inform your caregiver of this when you call.  There is vaginal spotting or bleeding. Notify your caregiver of the amount and how many pads are used.  You continue to feel sick to your stomach (nauseous) and have no relief from remedies suggested, or you throw up (vomit) blood or coffee ground like materials.  You develop upper abdominal pain.  You have round ligament discomfort in the lower abdominal area. This still  must be evaluated by your caregiver.  You feel contractions of the uterus.  You do not feel the baby move, or there is less movement than before.  You have painful urination.  You have abnormal vaginal discharge.  You have persistent diarrhea.  You get a severe headache.  You have problems with your vision.  You develop muscle weakness.  You feel dizzy and faint.  You develop shortness of breath.  You develop chest pain.  You have back pain that travels down to your leg and feet.  You feel irregular or a very fast heartbeat.  You develop excessive weight gain in a short period of time (5 pounds in 3 to 5 days).  You are involved in a domestic violence situation. Document Released: 11/14/2005 Document Revised: 05/15/2012 Document Reviewed: 05/08/2009 [facility] Patient Information 2014 [place], [place].

## 2014-01-03 NOTE — BH Assessment (Signed)
Clinician consulted with Alberteen SamFran Hobson NP who states Pt meets criteria for inpatient detox but [facility] and [facility]. Clinician contacted Dr. Redgie GrayerMasneri to provide recommendation. Dr. Charline BillsStated pt wanted to leave ED to charge her ankle bracelet.  Pt reported she would return in the morning. Dr. Charline BillsStated he would discharge. Clinician contacted Marcelino DusterMichelle, DT to inform to discontinue looking for placement.  Yaakov Guthrieelilah Stewart, MSW, LCSW Triage Specialist [phone]

## 2014-01-03 NOTE — BH Assessment (Signed)
Tele Assessment Note   Meghan Bullock is an 23 y.o. female who presents voluntarily to [facility] due to requesting opioid detox. Pt also reports being [redacted] weeks pregnant. Pt reported "I don't function without it. I'm tired of being sick."  Pt reported last use of heroin was "this morning," and she reports frequency and duration is daily for the last 4-5 months. Pt reported experiencing withdrawal symptoms such as weakness, chills, diarrhea, and nausea. Pt reported experiencing anxiety symptoms such as worrying, difficulty sleeping, fatigue, and lost of interest in normal activities.  Pt reported not being able to identify any current stressors.  Pt reported she currently lives in the home with her grandmother.   Pt's mood was anxious and affect was congruent to mood. Pt was Ox4. Pt denies SI. Pt denies HI. Pt denies A/V/H.  Pt reported being in detox "for prsecription pills" in facility in [place] in 2011.    Axis I: Opioid Use Disorder, Severe; R/O induced anxiety disorder Axis II: Deferred Axis III:  Past Medical History  Diagnosis Date  . Mononucleosis   . Ovarian cyst   . Chlamydia   . Bipolar 1 disorder   . Anxiety   . Asthma   . Heroin abuse    Axis IV: problems related to legal system/crime Axis V: 30  Past Medical History:  Past Medical History  Diagnosis Date  . Mononucleosis   . Ovarian cyst   . Chlamydia   . Bipolar 1 disorder   . Anxiety   . Asthma   . Heroin abuse     Past Surgical History  Procedure Laterality Date  . No past surgeries      Family History: No family history on file.  Social History:  reports that she has been smoking Cigarettes.  She has a 5 pack-year smoking history. She does not have any smokeless tobacco history on file. She reports that she uses illicit drugs (IV, Heroin, and Marijuana). She reports that she does not drink alcohol.  Additional Social History:  Alcohol / Drug Use Pain Medications: none reported Prescriptions: none  reported Over the Counter: none reported History of alcohol / drug use?: Yes Longest period of sobriety (when/how long): 4 months Withdrawal Symptoms: Weakness;Diarrhea;Fever / Chills;Nausea / Vomiting Substance #1 Name of Substance 1: Heroin 1 - Age of First Use: 20 1 - Amount (size/oz): "a 20" 1 - Frequency: daily 1 - Duration: 4-5 months 1 - Last Use / Amount: 01/03/14  CIWA: CIWA-Ar BP: 107/63 mmHg Pulse Rate: 81 COWS: Clinical Opiate Withdrawal Scale (COWS) Resting Pulse Rate: Pulse Rate 101-120 Sweating: No report of chills or flushing Restlessness: Able to sit still Pupil Size: Pupils pinned or normal size for room light Bone or Joint Aches: Not present Runny Nose or Tearing: Not present GI Upset: No GI symptoms Tremor: No tremor Yawning: No yawning Anxiety or Irritability: None Gooseflesh Skin: Skin is smooth COWS Total Score: 2  Allergies:  Allergies  Allergen Reactions  . Hydrocodone Itching  . Advil [Ibuprofen] Rash    C/O itching with 800mg  IBU, but takes OTC Advil without any problems.  . Tramadol Rash    Home Medications:  (Not in a hospital admission)  OB/GYN Status:  Patient's last menstrual period was 10/10/2013.  General Assessment Data [facility] of Assessment: [facility] Services Is this a Tele or Face-to-Face Assessment?: Tele Assessment Is this an Initial Assessment or a Re-assessment for this encounter?: Initial Assessment Living Arrangements: Other relatives Can pt return to current  living arrangement?: Yes Admission Status: Voluntary Is patient capable of signing voluntary admission?: Yes Transfer from: Acute Hospital Referral Source: Self/Family/Friend  Medical Screening Exam [facility] Walk-in ONLY) Medical Exam completed:  (NA)  [facility] Crisis Care Plan Living Arrangements: Other relatives Name of Psychiatrist:  (NA) Name of Therapist:  (NA)     Risk to self Suicidal Ideation: No Suicidal Intent: No Is patient at risk for suicide?:  No Suicidal Plan?: No Access to Means:  (NA) What has been your use of drugs/alcohol within the last 12 months?:  (Heroin) Previous Attempts/Gestures: No How many times?: 0 Other Self Harm Risks:  (drug use) Triggers for Past Attempts:  (NA) Intentional Self Injurious Behavior: None Family Suicide History: Unknown Recent stressful life event(s): Other (Comment) (Pt reported no stressors.) Persecutory voices/beliefs?: No Depression: Yes Depression Symptoms: Insomnia;Fatigue;Loss of interest in usual pleasures Substance abuse history and/or treatment for substance abuse?: Yes Suicide prevention information given to non-admitted patients: Not applicable  Risk to Others Homicidal Ideation: No Thoughts of Harm to Others: No Current Homicidal Intent: No Current Homicidal Plan: No Access to Homicidal Means:  (NA) Identified Victim:  (NA) History of harm to others?: No Assessment of Violence: None Noted Violent Behavior Description:  (Pt was cooperative during assessment. ) Does patient have access to weapons?: No Criminal Charges Pending?: Yes Describe Pending Criminal Charges:  (Obtain property by false pretense) Does patient have a court date: Yes Court Date:  (01/07/14)  Psychosis Hallucinations: None noted Delusions: None noted  Mental Status Report Appear/Hygiene:  (Pt was wearing paper scrubs. ) Eye Contact: Fair Motor Activity: Agitation Speech: Logical/coherent Level of Consciousness: Alert Mood: Anxious Affect: Anxious Anxiety Level: Moderate Thought Processes: Coherent;Relevant Judgement: Impaired Orientation: Person;Time;Place;Situation Obsessive Compulsive Thoughts/Behaviors: None  Cognitive Functioning Concentration: Normal Memory: Recent Intact;Remote Intact IQ: Average Insight: Poor Impulse Control: Poor Appetite: Fair Weight Loss: 0 Weight Gain: 0 Sleep: Decreased Total Hours of Sleep: 6 Vegetative Symptoms: None  ADLScreening [facility] Assessment  Services) Patient's cognitive ability adequate to safely complete daily activities?: Yes Patient able to express need for assistance with ADLs?: Yes Independently performs ADLs?: Yes (appropriate for developmental age)  Prior Inpatient Therapy Prior Inpatient Therapy: Yes Prior Therapy Dates: 2011 Prior Therapy Facilty/Provider(s):  (Facility in [place], [place]) Reason for Treatment: Pt reports "detox from pills"  Prior Outpatient Therapy Prior Outpatient Therapy: No Prior Therapy Dates:  (NA) Prior Therapy Facilty/Provider(s):  (NA) Reason for Treatment:  (NA)  ADL Screening (condition at time of admission) Patient's cognitive ability adequate to safely complete daily activities?: Yes Is the patient deaf or have difficulty hearing?: No Does the patient have difficulty seeing, even when wearing glasses/contacts?: No Does the patient have difficulty concentrating, remembering, or making decisions?: No Patient able to express need for assistance with ADLs?: Yes Does the patient have difficulty dressing or bathing?: No Independently performs ADLs?: Yes (appropriate for developmental age)       Abuse/Neglect Assessment (Assessment to be complete while patient is alone) Physical Abuse: Denies Verbal Abuse: Denies Sexual Abuse: Denies Exploitation of patient/patient's resources: Denies Self-Neglect: Denies Values / Beliefs Cultural Requests During Hospitalization: None Spiritual Requests During Hospitalization: None   Advance Directives (For Healthcare) Advance Directive: Patient does not have advance directive    Additional Information 1:1 In Past 12 Months?: No CIRT Risk: No Elopement Risk: No Does patient have medical clearance?: Yes   Disposition: Clinician consulted with Alberteen Sam NP who states Pt meets criteria for inpatient detox but pt is not [facility] for [facility]  due to opioid detox and preganancy. TTS will look for other placement options.   Yaakov Guthrieelilah Stewart, MSW,  LCSW Triage Specialist [phone]   Disposition Initial Assessment Completed for this Encounter: Yes Disposition of Patient: Inpatient treatment program Type of inpatient treatment program: Adult  Stewart,Mitul Hallowell R 01/03/2014 10:35 PM

## 2014-01-04 LAB — OB RESULTS CONSOLE HIV ANTIBODY (ROUTINE TESTING): HIV: NONREACTIVE

## 2014-01-04 LAB — OB RESULTS CONSOLE GC/CHLAMYDIA
Chlamydia: NEGATIVE
GC PROBE AMP, GENITAL: NEGATIVE

## 2014-01-04 LAB — OB RESULTS CONSOLE HEPATITIS B SURFACE ANTIGEN: Hepatitis B Surface Ag: NEGATIVE

## 2014-01-04 LAB — OB RESULTS CONSOLE GBS: GBS: NEGATIVE

## 2014-01-04 LAB — OB RESULTS CONSOLE RUBELLA ANTIBODY, IGM: RUBELLA: IMMUNE

## 2014-01-04 LAB — OB RESULTS CONSOLE RPR: RPR: NONREACTIVE

## 2014-01-04 LAB — OB RESULTS CONSOLE VARICELLA ZOSTER ANTIBODY, IGG: Varicella: IMMUNE

## 2014-01-04 NOTE — ED Provider Notes (Signed)
Medical screening examination/treatment/procedure(s) were performed by non-physician practitioner and as supervising physician I was immediately available for consultation/collaboration.  Tamecka Milham L Delorese Sellin, MD 01/04/14 0706 

## 2014-02-04 ENCOUNTER — Encounter (HOSPITAL_COMMUNITY): Payer: Self-pay | Admitting: *Deleted

## 2014-02-04 ENCOUNTER — Emergency Department (HOSPITAL_COMMUNITY)
Admission: EM | Admit: 2014-02-04 | Discharge: 2014-02-04 | Disposition: A | Discharge status: Home / Self Care | Payer: Medicaid Other | Attending: Emergency Medicine | Admitting: Emergency Medicine

## 2014-02-04 DIAGNOSIS — J45909 Unspecified asthma, uncomplicated: Secondary | ICD-10-CM | POA: Insufficient documentation

## 2014-02-04 DIAGNOSIS — Z79899 Other long term (current) drug therapy: Secondary | ICD-10-CM | POA: Insufficient documentation

## 2014-02-04 DIAGNOSIS — O99340 Other mental disorders complicating pregnancy, unspecified trimester: Secondary | ICD-10-CM | POA: Insufficient documentation

## 2014-02-04 DIAGNOSIS — O9989 Other specified diseases and conditions complicating pregnancy, childbirth and the puerperium: Secondary | ICD-10-CM | POA: Insufficient documentation

## 2014-02-04 DIAGNOSIS — O99330 Smoking (tobacco) complicating pregnancy, unspecified trimester: Secondary | ICD-10-CM | POA: Insufficient documentation

## 2014-02-04 DIAGNOSIS — Z8619 Personal history of other infectious and parasitic diseases: Secondary | ICD-10-CM | POA: Insufficient documentation

## 2014-02-04 DIAGNOSIS — O99322 Drug use complicating pregnancy, second trimester: Secondary | ICD-10-CM

## 2014-02-04 DIAGNOSIS — F1110 Opioid abuse, uncomplicated: Secondary | ICD-10-CM | POA: Insufficient documentation

## 2014-02-04 LAB — RAPID URINE DRUG SCREEN, HOSP PERFORMED
AMPHETAMINES: NOT DETECTED
BENZODIAZEPINES: NOT DETECTED
Barbiturates: NOT DETECTED
Cocaine: NOT DETECTED
Opiates: NOT DETECTED
Tetrahydrocannabinol: NOT DETECTED

## 2014-02-04 LAB — CBC WITH DIFFERENTIAL/PLATELET
BASOS ABS: 0 10*3/uL (ref 0.0–0.1)
BASOS PCT: 0 % (ref 0–1)
EOS ABS: 0.1 10*3/uL (ref 0.0–0.7)
EOS PCT: 1 % (ref 0–5)
HCT: 36.7 % (ref 36.0–46.0)
Hemoglobin: 11.9 g/dL — ABNORMAL LOW (ref 12.0–15.0)
Lymphocytes Relative: 14 % (ref 12–46)
Lymphs Abs: 2 10*3/uL (ref 0.7–4.0)
MCH: 29 pg (ref 26.0–34.0)
MCHC: 32.4 g/dL (ref 30.0–36.0)
MCV: 89.3 fL (ref 78.0–100.0)
Monocytes Absolute: 0.8 10*3/uL (ref 0.1–1.0)
Monocytes Relative: 6 % (ref 3–12)
Neutro Abs: 11.3 10*3/uL — ABNORMAL HIGH (ref 1.7–7.7)
Neutrophils Relative %: 79 % — ABNORMAL HIGH (ref 43–77)
PLATELETS: 268 10*3/uL (ref 150–400)
RBC: 4.11 MIL/uL (ref 3.87–5.11)
RDW: 14.4 % (ref 11.5–15.5)
WBC: 14.2 10*3/uL — ABNORMAL HIGH (ref 4.0–10.5)

## 2014-02-04 LAB — URINALYSIS, ROUTINE W REFLEX MICROSCOPIC
BILIRUBIN URINE: NEGATIVE
Glucose, UA: NEGATIVE mg/dL
Hgb urine dipstick: NEGATIVE
Ketones, ur: NEGATIVE mg/dL
Nitrite: NEGATIVE
PROTEIN: NEGATIVE mg/dL
Specific Gravity, Urine: 1.02 (ref 1.005–1.030)
UROBILINOGEN UA: 0.2 mg/dL (ref 0.0–1.0)
pH: 7.5 (ref 5.0–8.0)

## 2014-02-04 LAB — URINE MICROSCOPIC-ADD ON

## 2014-02-04 LAB — COMPREHENSIVE METABOLIC PANEL
ALBUMIN: 3.4 g/dL — AB (ref 3.5–5.2)
ALK PHOS: 73 U/L (ref 39–117)
ALT: 21 U/L (ref 0–35)
AST: 14 U/L (ref 0–37)
BUN: 4 mg/dL — ABNORMAL LOW (ref 6–23)
CALCIUM: 9.7 mg/dL (ref 8.4–10.5)
CO2: 23 mEq/L (ref 19–32)
Chloride: 98 mEq/L (ref 96–112)
Creatinine, Ser: 0.44 mg/dL — ABNORMAL LOW (ref 0.50–1.10)
GFR calc Af Amer: >90 mL/min (ref >90)
GFR calc non Af Amer: >90 mL/min (ref >90)
Glucose, Bld: 84 mg/dL (ref 70–99)
POTASSIUM: 3.9 meq/L (ref 3.7–5.3)
SODIUM: 136 meq/L — AB (ref 137–147)
TOTAL PROTEIN: 7.4 g/dL (ref 6.0–8.3)
Total Bilirubin: <0.2 mg/dL — ABNORMAL LOW (ref 0.3–1.2)

## 2014-02-04 NOTE — Discharge Instructions (Signed)
Please refrain from using illicit substance, both in general and while pregnant in particular.  Return here for concerning changes in your condition.  Otherwise, please be sure to follow-up with your obstetrician.

## 2014-02-04 NOTE — Progress Notes (Signed)
Pt on ext fetal monitor. Reports baby active. No ROM or vag bleeding. States she has a UTI and is on a medication but doesn't know the name. Has missed doses of medication because it makes her feel nauseated.

## 2014-02-04 NOTE — ED Provider Notes (Signed)
CSN: [id number]     Arrival date & time 02/04/14  1135 History   First MD Initiated Contact with Patient 02/04/14 1145     Chief Complaint  Patient presents with  . drug ingestion, pregnant       HPI  Patient presents from incarceration due to concerns of illicit ingestion and pregnancy. Patient is 5 months pregnant, last obstetric check was approximately 3 weeks ago at another facility when the patient was detoxing from heroin. Patient states that she has no current complaints, including no pain, nausea, diarrhea, vaginal complaints, urinary complaints. She does acknowledge using ecstasy approximately 18 hours ago.  Past Medical History  Diagnosis Date  . Mononucleosis   . Ovarian cyst   . Chlamydia   . Bipolar 1 disorder   . Anxiety   . Asthma   . Heroin abuse    Past Surgical History  Procedure Laterality Date  . No past surgeries     No family history on file. History  Substance Use Topics  . Smoking status: Current Every Day Smoker -- 1.00 packs/day for 5 years    Types: Cigarettes  . Smokeless tobacco: Not on file  . Alcohol Use: No   OB History   Grav Para Term Preterm Abortions TAB SAB Ect Mult Living   3    1  1         Review of Systems  Constitutional:       Per HPI, otherwise negative  HENT:       Per HPI, otherwise negative  Respiratory:       Per HPI, otherwise negative  Cardiovascular:       Per HPI, otherwise negative  Gastrointestinal: Negative for vomiting.  Endocrine:       Negative aside from HPI  Genitourinary:       Neg aside from HPI   Musculoskeletal:       Per HPI, otherwise negative  Skin: Negative.   Neurological: Negative for syncope.      Allergies  Hydrocodone; Advil; and Tramadol  Home Medications   Current Outpatient Rx  Name  Route  Sig  Dispense  Refill  . acetaminophen (TYLENOL) 325 MG tablet   Oral   Take 650 mg by mouth every 6 (six) hours as needed (pain).         Marland Kitchen loperamide (IMODIUM) 2 MG capsule    Oral   Take 2 mg by mouth 4 (four) times daily as needed for diarrhea or loose stools (loosre stools).         . meclizine (ANTIVERT) 25 MG tablet   Oral   Take 25 mg by mouth 3 (three) times daily as needed for dizziness (dizziness).         . penicillin v potassium (VEETID) 250 MG tablet   Oral   Take 750 mg by mouth 4 (four) times daily. Take 3 tablets daily for 10 days         . Prenatal Vit-Fe Fumarate-FA (PRENATAL MULTIVITAMIN) TABS tablet   Oral   Take 1 tablet by mouth daily at 12 noon.          BP 116/67  Pulse 86  Temp(Src) 98.4 F (36.9 C) (Oral)  Resp 16  SpO2 100%  LMP 10/10/2013 Physical Exam  Nursing note and vitals reviewed. Constitutional: She is oriented to person, place, and time. She appears well-developed and well-nourished. No distress.  HENT:  Head: Normocephalic and atraumatic.  Eyes: Conjunctivae and EOM are normal.  Cardiovascular: Normal rate and regular rhythm.   Pulmonary/Chest: Effort normal and breath sounds normal. No stridor. No respiratory distress.  Abdominal: She exhibits no distension. There is no tenderness. There is no rebound.  Musculoskeletal: She exhibits no edema.  Neurological: She is alert and oriented to person, place, and time. No cranial nerve deficit.  Skin: Skin is warm and dry.  Psychiatric: She has a normal mood and affect.    ED Course  Procedures (including critical care time) Labs Review Labs Reviewed  URINALYSIS, ROUTINE W REFLEX MICROSCOPIC  CBC WITH DIFFERENTIAL  COMPREHENSIVE METABOLIC PANEL  URINE RAPID DRUG SCREEN (HOSP PERFORMED)   Imaging Review No results found. Immediately after the initial evaluation our obstetrics retrosponse team was called.  Patient has fetal monitoring in place   2:44 PM No new complaints.  Obstetrics recommends discharge with outpatient followup. MDM   Final diagnoses:  Drug use complicating pregnancy in second trimester    Patient presents one day after using  ecstasy.  Notably, the patient's pregnant, and incarcerated.  On exam patient is in no distress, hemodynamically stable.  Fetal monitoring was unremarkable.  Given the reassuring findings, the absence of distress the patient was discharged to follow up with her obstetrics team.  She was discharged back to the custody of law enforcement.    Gerhard Munchobert Lucyle Alumbaugh, MD 02/04/14 [phone]

## 2014-02-04 NOTE — Progress Notes (Signed)
Spoke to Dr Jolayne Pantheronstant. Pt FH strip Cat 1. No complaints by pt. Pt cleared obstetrically. To continue prenatal care with provider in [place].

## 2014-02-04 NOTE — ED Notes (Signed)
Pt has been in police custody, reports that pt is 4-5 months pregnant and has taken ectasy in last 24 hours.

## 2014-02-20 ENCOUNTER — Encounter: Payer: Self-pay | Admitting: Obstetrics & Gynecology

## 2014-05-12 ENCOUNTER — Inpatient Hospital Stay (HOSPITAL_COMMUNITY)
Admission: AD | Admit: 2014-05-12 | Discharge: 2014-05-15 | DRG: 775 | Payer: Medicaid Other | Attending: Obstetrics & Gynecology | Admitting: Obstetrics & Gynecology

## 2014-05-12 ENCOUNTER — Inpatient Hospital Stay (HOSPITAL_COMMUNITY): Payer: Medicaid Other | Admitting: Anesthesiology

## 2014-05-12 ENCOUNTER — Encounter (HOSPITAL_COMMUNITY): Payer: Medicaid Other | Admitting: Anesthesiology

## 2014-05-12 ENCOUNTER — Encounter (HOSPITAL_COMMUNITY): Payer: Self-pay | Admitting: *Deleted

## 2014-05-12 DIAGNOSIS — F411 Generalized anxiety disorder: Secondary | ICD-10-CM | POA: Diagnosis present

## 2014-05-12 DIAGNOSIS — O99344 Other mental disorders complicating childbirth: Principal | ICD-10-CM | POA: Diagnosis present

## 2014-05-12 DIAGNOSIS — O42919 Preterm premature rupture of membranes, unspecified as to length of time between rupture and onset of labor, unspecified trimester: Secondary | ICD-10-CM

## 2014-05-12 DIAGNOSIS — O99334 Smoking (tobacco) complicating childbirth: Secondary | ICD-10-CM | POA: Diagnosis present

## 2014-05-12 DIAGNOSIS — O4290 Premature rupture of membranes, unspecified as to length of time between rupture and onset of labor, unspecified weeks of gestation: Secondary | ICD-10-CM | POA: Diagnosis present

## 2014-05-12 DIAGNOSIS — F1110 Opioid abuse, uncomplicated: Secondary | ICD-10-CM | POA: Diagnosis present

## 2014-05-12 HISTORY — DX: Preterm premature rupture of membranes, unspecified as to length of time between rupture and onset of labor, unspecified trimester: O42.919

## 2014-05-12 LAB — CBC
HEMATOCRIT: 31.9 % — AB (ref 36.0–46.0)
Hemoglobin: 10.5 g/dL — ABNORMAL LOW (ref 12.0–15.0)
MCH: 27.8 pg (ref 26.0–34.0)
MCHC: 32.9 g/dL (ref 30.0–36.0)
MCV: 84.4 fL (ref 78.0–100.0)
PLATELETS: 259 10*3/uL (ref 150–400)
RBC: 3.78 MIL/uL — AB (ref 3.87–5.11)
RDW: 14.1 % (ref 11.5–15.5)
WBC: 13.9 10*3/uL — AB (ref 4.0–10.5)

## 2014-05-12 LAB — GROUP B STREP BY PCR: Group B strep by PCR: NEGATIVE

## 2014-05-12 LAB — TYPE AND SCREEN
ABO/RH(D): O POS
ANTIBODY SCREEN: NEGATIVE

## 2014-05-12 LAB — RAPID URINE DRUG SCREEN, HOSP PERFORMED
Amphetamines: NOT DETECTED
BENZODIAZEPINES: NOT DETECTED
Barbiturates: NOT DETECTED
Cocaine: NOT DETECTED
OPIATES: NOT DETECTED
TETRAHYDROCANNABINOL: NOT DETECTED

## 2014-05-12 LAB — RPR

## 2014-05-12 MED ORDER — FENTANYL 2.5 MCG/ML BUPIVACAINE 1/10 % EPIDURAL INFUSION (WH - ANES)
14.0000 mL/h | INTRAMUSCULAR | Status: DC | PRN
  Administered 2014-05-12 – 2014-05-13 (×3): 14 mL/h via EPIDURAL
  Filled 2014-05-12 (×2): qty 125

## 2014-05-12 MED ORDER — PHENYLEPHRINE 40 MCG/ML (10ML) SYRINGE FOR IV PUSH (FOR BLOOD PRESSURE SUPPORT): 80.0000 ug | PREFILLED_SYRINGE | INTRAVENOUS | Status: DC | PRN

## 2014-05-12 MED ORDER — OXYTOCIN 40 UNITS IN LACTATED RINGERS INFUSION - SIMPLE MED
1.0000 m[IU]/min | INTRAVENOUS | Status: DC
  Administered 2014-05-12: 2 m[IU]/min via INTRAVENOUS
  Administered 2014-05-12: 4 m[IU]/min via INTRAVENOUS
  Filled 2014-05-12 (×2): qty 1000

## 2014-05-12 MED ORDER — LIDOCAINE HCL (PF) 1 % IJ SOLN
30.0000 mL | INTRAMUSCULAR | Status: DC | PRN
  Filled 2014-05-12: qty 30

## 2014-05-12 MED ORDER — PROMETHAZINE HCL 25 MG/ML IJ SOLN
12.5000 mg | Freq: Once | INTRAMUSCULAR | Status: AC
  Administered 2014-05-12: 12.5 mg via INTRAVENOUS
  Filled 2014-05-12: qty 1

## 2014-05-12 MED ORDER — TERBUTALINE SULFATE 1 MG/ML IJ SOLN: 0.2500 mg | Freq: Once | INTRAMUSCULAR | Status: AC | PRN

## 2014-05-12 MED ORDER — FENTANYL 2.5 MCG/ML BUPIVACAINE 1/10 % EPIDURAL INFUSION (WH - ANES)
INTRAMUSCULAR | Status: DC | PRN
  Administered 2014-05-12: 14 mL/h via EPIDURAL

## 2014-05-12 MED ORDER — EPHEDRINE 5 MG/ML INJ: 10.0000 mg | INTRAVENOUS | Status: DC | PRN

## 2014-05-12 MED ORDER — OXYTOCIN BOLUS FROM INFUSION: 500.0000 mL | INTRAVENOUS | Status: DC

## 2014-05-12 MED ORDER — CITRIC ACID-SODIUM CITRATE 334-500 MG/5ML PO SOLN: 30.0000 mL | ORAL | Status: DC | PRN

## 2014-05-12 MED ORDER — ONDANSETRON HCL 4 MG/2ML IJ SOLN
4.0000 mg | Freq: Four times a day (QID) | INTRAMUSCULAR | Status: DC | PRN
  Administered 2014-05-12: 4 mg via INTRAVENOUS
  Filled 2014-05-12: qty 2

## 2014-05-12 MED ORDER — FENTANYL 2.5 MCG/ML BUPIVACAINE 1/10 % EPIDURAL INFUSION (WH - ANES)
INTRAMUSCULAR | Status: AC
  Administered 2014-05-12: 14 mL/h via EPIDURAL
  Filled 2014-05-12: qty 125

## 2014-05-12 MED ORDER — IBUPROFEN 600 MG PO TABS: 600.0000 mg | ORAL_TABLET | Freq: Four times a day (QID) | ORAL | Status: DC | PRN

## 2014-05-12 MED ORDER — DIPHENHYDRAMINE HCL 50 MG/ML IJ SOLN: 12.5000 mg | INTRAMUSCULAR | Status: DC | PRN

## 2014-05-12 MED ORDER — LACTATED RINGERS IV SOLN: 500.0000 mL | Freq: Once | INTRAVENOUS | Status: AC

## 2014-05-12 MED ORDER — OXYCODONE-ACETAMINOPHEN 5-325 MG PO TABS: 1.0000 | ORAL_TABLET | ORAL | Status: DC | PRN

## 2014-05-12 MED ORDER — PHENYLEPHRINE 40 MCG/ML (10ML) SYRINGE FOR IV PUSH (FOR BLOOD PRESSURE SUPPORT)
PREFILLED_SYRINGE | INTRAVENOUS | Status: AC
  Filled 2014-05-12: qty 10

## 2014-05-12 MED ORDER — LIDOCAINE HCL (PF) 1 % IJ SOLN
INTRAMUSCULAR | Status: DC | PRN
  Administered 2014-05-12 (×2): 9 mL

## 2014-05-12 MED ORDER — NALBUPHINE HCL 10 MG/ML IJ SOLN
10.0000 mg | Freq: Once | INTRAMUSCULAR | Status: AC
  Administered 2014-05-12: 10 mg via INTRAVENOUS
  Filled 2014-05-12: qty 1

## 2014-05-12 MED ORDER — LACTATED RINGERS IV SOLN
INTRAVENOUS | Status: DC
  Administered 2014-05-12 – 2014-05-13 (×3): via INTRAVENOUS

## 2014-05-12 MED ORDER — OXYTOCIN 40 UNITS IN LACTATED RINGERS INFUSION - SIMPLE MED
62.5000 mL/h | INTRAVENOUS | Status: DC
  Administered 2014-05-13: 62.5 mL/h via INTRAVENOUS

## 2014-05-12 MED ORDER — LACTATED RINGERS IV SOLN
500.0000 mL | INTRAVENOUS | Status: DC | PRN
  Administered 2014-05-12: 500 mL via INTRAVENOUS
  Administered 2014-05-12 (×2): 1000 mL via INTRAVENOUS

## 2014-05-12 MED ORDER — EPHEDRINE 5 MG/ML INJ
INTRAVENOUS | Status: AC
  Filled 2014-05-12: qty 4

## 2014-05-12 MED ORDER — ACETAMINOPHEN 325 MG PO TABS: 650.0000 mg | ORAL_TABLET | ORAL | Status: DC | PRN

## 2014-05-12 NOTE — Progress Notes (Signed)
Meghan SamsSarah E Bullock is a 23 y.o. G3P0020 at 2132w3d admitted for PROM  Subjective: Starting to contract more painfully. +FM.    Objective: BP 105/60  Pulse 75  Temp(Src) 98.7 F (37.1 C) (Oral)  Resp 20  Ht 5\' 4"  (1.626 m)  Wt 77.111 kg (170 lb)  BMI 29.17 kg/m2  SpO2 97%  LMP 10/10/2013   Total I/O In: -  Out: 900 [Urine:900]  FHT:  FHR: 145 bpm, variability: moderate,  accelerations:  Present,  decelerations:  Present rare occasional earlies UC:   irregular, every 2-5 minutes  SVE:   Dilation: 5.5 Effacement (%): 80;90 Station: -2 Exam by:: h stone rnc  Labs: Lab Results  Component Value Date   WBC 13.9* 05/12/2014   HGB 10.5* 05/12/2014   HCT 31.9* 05/12/2014   MCV 84.4 05/12/2014   PLT 259 05/12/2014    Assessment / Plan: IOL for PROM  Labor: progressing on pitocin. cont to increase.  Fetal Wellbeing:  Category II Pain Control:  Labor support without medications I/D:  n/a Anticipated MOD:  NSVD  Tracker Mance L 05/12/2014, 11:43 PM

## 2014-05-12 NOTE — Progress Notes (Signed)
CSW met with pt briefly to discuss discharge plans for the infant since pt is currently incarcerated.  Pt provided CSW with her grandmothers, (Nancy Mccrystal) information [phone] or [phone] & requested that she be notified of hospitalization.  Pt states she has made arrangements with her grandmother to care for the baby until she is released from jail.  Pt is hopeful that she will be released on May 14, 2014.  According to her attorney, she will be released into a strict "drug court" program.  CSW left a message requesting a call back from pt's grandmother.  CSW will continue to assist pt & facilitate appropriate discharge of infant. 

## 2014-05-12 NOTE — Progress Notes (Signed)
Meghan Bullock is a 23 y.o. G3P0020 at 5941w3d admitted for PROM  Subjective: Feeling more pain and increased pressure.   Objective: BP 108/49  Pulse 74  Temp(Src) 98.7 F (37.1 C) (Oral)  Resp 20  Ht 5\' 4"  (1.626 m)  Wt 77.111 kg (170 lb)  BMI 29.17 kg/m2  SpO2 100%  LMP 10/10/2013      FHT:  FHR: 150 bpm, variability: moderate,  accelerations:  Present,  decelerations:  Absent UC:   regular, every 3-4 minutes SVE: Dilation:  Effacement (%): 70 Station: -2 Exam by : Meghan Bullock     Labs: Lab Results  Component Value Date   WBC 13.9* 05/12/2014   HGB 10.5* 05/12/2014   HCT 31.9* 05/12/2014   MCV 84.4 05/12/2014   PLT 259 05/12/2014    Assessment / Plan: PROM  Labor: FB has fallen out,  on pit, feeling increased ctx.  Fetal Wellbeing:  Category I Pain Control:  Received nubain x 1. Planning for Epidural  I/D:  GBS neg Anticipated MOD:  NSVD  Meghan Bullock, Meghan Bullock 05/12/2014, 6:30 PM

## 2014-05-12 NOTE — Progress Notes (Signed)
Meghan SamsSarah Bullock Bullock is a 23 y.o. G3P0020 at 3699w3d admitted for PROM  Subjective: Pain improved. Feeling some pressure.   Objective: BP 127/82  Pulse 80  Temp(Src) 98.7 F (37.1 C) (Oral)  Resp 20  Ht 5\' 4"  (1.626 m)  Wt 77.111 kg (170 lb)  BMI 29.17 kg/m2  SpO2 100%  LMP 10/10/2013      FHT:  FHR: 140 bpm, variability: moderate,  accelerations:  Present,  decelerations:  Absent UC:   regular, every 4-6 minutes SVE:   Dilation: 1 Effacement (%): 50 Station: -2 Exam by:: Dr Reola CalkinsBeck  Labs: Lab Results  Component Value Date   WBC 13.9* 05/12/2014   HGB 10.5* 05/12/2014   HCT 31.9* 05/12/2014   MCV 84.4 05/12/2014   PLT 259 05/12/2014    Assessment / Plan: PROM   Labor: FB in place  Fetal Wellbeing:  Category I Pain Control:  Nubain I/D:  GBS neg  Anticipated MOD:  NSVD  Meghan Bullock, Meghan Bullock 05/12/2014, 2:08 PM

## 2014-05-12 NOTE — Progress Notes (Signed)
Meghan Bullock is a 23 y.o. G3P0020 at 9610w3d admitted for PROM  Subjective:  Doing well. Contracting but not very painfully. +FM.    Objective: BP 100/53  Pulse 80  Temp(Src) 98.3 F (36.8 C) (Oral)  Resp 20  Ht 5\' 4"  (1.626 m)  Wt 77.111 kg (170 lb)  BMI 29.17 kg/m2  SpO2 100%  LMP 10/10/2013      FHT:  FHR: 135 bpm, variability: moderate,  accelerations:  Present,  decelerations:  Absent UC:   irregular, every 6-9 minutes SVE:   Dilation: 1 Effacement (%): 70 Station: -2  Labs: Lab Results  Component Value Date   WBC 13.9* 05/12/2014   HGB 10.5* 05/12/2014   HCT 31.9* 05/12/2014   MCV 84.4 05/12/2014   PLT 259 05/12/2014    Assessment / Plan: PROM  Labor: FB placed and inflated with 60cc of LR Fetal Wellbeing:  Category I Pain Control:  Labor support without medications I/D:  n/a Anticipated MOD:  NSVD  Meghan Bullock 05/12/2014, 10:07 AM

## 2014-05-12 NOTE — H&P (Signed)
Attestation of Attending Supervision of Advanced Practitioner (PA/CNM/NP): Evaluation and management procedures were performed by the Advanced Practitioner under my supervision and collaboration.  I have reviewed the Advanced Practitioner's note and chart, and I agree with the management and plan.  Reva BoresPRATT,Rameen Gohlke S, MD [facility] Attending 05/12/2014 7:27 AM

## 2014-05-12 NOTE — Anesthesia Procedure Notes (Signed)
Epidural Patient location during procedure: OB Start time: 05/12/2014 7:39 PM End time: 05/12/2014 7:43 PM  Staffing Anesthesiologist: Leilani AbleHATCHETT, Zarra Geffert Performed by: anesthesiologist   Preanesthetic Checklist Completed: patient identified, surgical consent, pre-op evaluation, timeout performed, IV checked, risks and benefits discussed and monitors and equipment checked  Epidural Patient position: sitting Prep: site prepped and draped and DuraPrep Patient monitoring: continuous pulse ox and blood pressure Approach: midline Location: L3-L4 Injection technique: LOR air  Needle:  Needle type: Tuohy  Needle gauge: 17 G Needle length: 9 cm and 9 Needle insertion depth: 7 cm Catheter type: closed end flexible Catheter size: 19 Gauge Catheter at skin depth: 12 cm Test dose: negative and Other  Assessment Sensory level: T9 Events: blood not aspirated, injection not painful, no injection resistance, negative IV test and no paresthesia  Additional Notes Reason for block:procedure for pain

## 2014-05-12 NOTE — MAU Note (Signed)
PT  IS  FROM JAIL-  CAME BACK FROM PRISON YESTERDAY.    SHE  GOT P[facility] IN PRISON-   THROUGH [facility] - COMES TO [facility]ISON.     SAYS  AT 0330- IN BED-  FELT FLUID RUN OUT -  THEN POURED.-  CLEAR.    LAST SEX- JAN.    DENIES UC.     DENIES HSV,  BUT HAD MRSA IN  LEFT HAND- FIRST FINGER-  BUT SAYS IT HAS BEEN CLEARED.    VOMITED LAST NIGHT

## 2014-05-12 NOTE — Anesthesia Preprocedure Evaluation (Signed)
Anesthesia Evaluation  Patient identified by MRN, date of birth, ID band Patient awake    Reviewed: Allergy & Precautions, H&P , NPO status , Patient's Chart, lab work & pertinent test results  Airway Mallampati: I TM Distance: >3 FB Neck ROM: full    Dental no notable dental hx.    Pulmonary Current Smoker,    Pulmonary exam normal       Cardiovascular negative cardio ROS      Neuro/Psych PSYCHIATRIC DISORDERS Anxiety Bipolar Disorder negative neurological ROS     GI/Hepatic negative GI ROS, Neg liver ROS,   Endo/Other  negative endocrine ROS  Renal/GU negative Renal ROS     Musculoskeletal   Abdominal Normal abdominal exam  (+)   Peds  Hematology negative hematology ROS (+)   Anesthesia Other Findings   Reproductive/Obstetrics (+) Pregnancy                           Anesthesia Physical Anesthesia Plan  ASA: II  Anesthesia Plan: Epidural   Post-op Pain Management:    Induction:   Airway Management Planned:   Additional Equipment:   Intra-op Plan:   Post-operative Plan:   Informed Consent: I have reviewed the patients History and Physical, chart, labs and discussed the procedure including the risks, benefits and alternatives for the proposed anesthesia with the patient or authorized representative who has indicated his/her understanding and acceptance.     Plan Discussed with:   Anesthesia Plan Comments:         Anesthesia Quick Evaluation

## 2014-05-12 NOTE — H&P (Signed)
Meghan SamsSarah E XXXBartenfield is a 23 y.o. femaleG3P0020 @[redacted]w[redacted]d  presenting for leakage of clear fluid started at 4-5 am.  She reports good fetal movement, denies regular contractions,, vaginal bleeding, vaginal itching/burning, urinary symptoms, h/a, dizziness, n/v, or fever/chills.    She is incarcerated for drug [facility] in [place] with plans for hearing and possible release on 6/17.  She reports she received regular prenatal care while incarcerated by [facility].  She denies complications with the pregnancy other than early pregnancy drug use, but she was weaned from narcotics a few months ago.  She did report a "borderline" result on her 1 hour glucose screen but refused to do a 3 hour glucose test.  Maternal Medical History:  Reason for admission: Rupture of membranes.  Nausea.  Contractions: Onset was less than 1 hour ago.   Frequency: irregular.   Duration is approximately 1 minute.   Perceived severity is mild.    Fetal activity: Perceived fetal activity is normal.   Last perceived fetal movement was within the past hour.    Prenatal complications: Substance abuse.   Pt detoxed from heroin in early pregnancy while incarcerated.    Prenatal Complications - Diabetes: Pt reports she is "borderline" based on a 1 hour glucose screen and a 3 hour glucose test was ordered but she refused the test.  OB History   Grav Para Term Preterm Abortions TAB SAB Ect Mult Living   3    2  2         Past Medical History  Diagnosis Date  . Mononucleosis   . Ovarian cyst   . Chlamydia   . Bipolar 1 disorder   . Anxiety   . Asthma   . Heroin abuse    Past Surgical History  Procedure Laterality Date  . No past surgeries     Family History: family history is not on file. Social History:  reports that she has been smoking Cigarettes.  She has a 5 pack-year smoking history. She does not have any smokeless tobacco history on file. She reports that she uses illicit drugs (IV,  Heroin, and Marijuana). She reports that she does not drink alcohol.   Prenatal Transfer Tool  Maternal Diabetes: No Genetic Screening: Normal Maternal Ultrasounds/Referrals: Normal Fetal Ultrasounds or other Referrals:  None Maternal Substance Abuse:  Yes:  Type: Marijuana, Other:  Significant Maternal Medications:  None Significant Maternal Lab Results:  Lab values include: Other:  Other Comments:  Pt on heroin in early pregnancy, reportedly detoxed while incarcerated a few months ago.  GBS unknown with rapid screen pending.  Review of Systems  Constitutional: Negative for fever, chills and malaise/fatigue.  Eyes: Negative for blurred vision.  Respiratory: Negative for cough and shortness of breath.   Cardiovascular: Negative for chest pain.  Gastrointestinal: Positive for abdominal pain. Negative for heartburn, nausea and vomiting.  Genitourinary: Negative for dysuria, urgency and frequency.  Musculoskeletal: Negative.   Neurological: Negative for dizziness and headaches.  Psychiatric/Behavioral: Negative for depression.    Dilation: 1 Effacement (%): 70 Station: -2 Blood pressure 128/86, pulse 78, temperature 99.3 F (37.4 C), temperature source Oral, resp. rate 18, last menstrual period 10/10/2013, SpO2 100.00%. Maternal Exam:  Uterine Assessment: Contraction strength is mild.  Contraction duration is 60 seconds. Contraction frequency is irregular.   Abdomen: Patient reports no abdominal tenderness. Fundal height is 37cm.   Fetal presentation: vertex  Introitus: Ferning test: positive.  Amniotic fluid character: clear.  Pelvis: adequate for delivery.  Cervix: Cervix evaluated by digital exam.     Fetal Exam Fetal Monitor Review: Mode: ultrasound.   Baseline rate: 135.  Variability: moderate (6-25 bpm).   Pattern: accelerations present and no decelerations.    Fetal State Assessment: Category I - tracings are normal.     Physical Exam  Nursing note and  vitals reviewed. Constitutional: She is oriented to person, place, and time. She appears well-developed and well-nourished.  Neck: Normal range of motion.  Cardiovascular: Normal rate and regular rhythm.   Respiratory: Effort normal and breath sounds normal.  GI: Soft.  Musculoskeletal: Normal range of motion.  Neurological: She is alert and oriented to person, place, and time. She has normal reflexes.  Skin: Skin is warm and dry.  Psychiatric: She has a normal mood and affect. Her behavior is normal. Judgment and thought content normal.    Prenatal labs: (Records requested from [facility] in [place]) ABO, Rh:   Antibody:   Rubella:   RPR:    HBsAg:    HIV:    GBS:   Rapid screen collected on admission  Assessment/Plan: G3P0020 @[redacted]w[redacted]d  with PPROM GBS unknown with rapid GBS pending  [facility] Records requested from prenatal provider GBS prophylaxis pending rapid screen results Expectant management for now, consider Pitocin augmentation as needed   Bullock, Meghan 05/12/2014, 6:40 AM

## 2014-05-13 ENCOUNTER — Encounter (HOSPITAL_COMMUNITY): Payer: Self-pay | Admitting: *Deleted

## 2014-05-13 DIAGNOSIS — F411 Generalized anxiety disorder: Secondary | ICD-10-CM

## 2014-05-13 DIAGNOSIS — O99344 Other mental disorders complicating childbirth: Secondary | ICD-10-CM

## 2014-05-13 LAB — ABO/RH: ABO/RH(D): O POS

## 2014-05-13 MED ORDER — DIPHENHYDRAMINE HCL 25 MG PO CAPS: 25.0000 mg | ORAL_CAPSULE | Freq: Four times a day (QID) | ORAL | Status: DC | PRN

## 2014-05-13 MED ORDER — TETANUS-DIPHTH-ACELL PERTUSSIS 5-2.5-18.5 LF-MCG/0.5 IM SUSP
0.5000 mL | Freq: Once | INTRAMUSCULAR | Status: AC
  Administered 2014-05-14: 0.5 mL via INTRAMUSCULAR
  Filled 2014-05-13: qty 0.5

## 2014-05-13 MED ORDER — SENNOSIDES-DOCUSATE SODIUM 8.6-50 MG PO TABS
2.0000 | ORAL_TABLET | ORAL | Status: DC
  Administered 2014-05-13: 2 via ORAL
  Filled 2014-05-13: qty 2

## 2014-05-13 MED ORDER — BENZOCAINE-MENTHOL 20-0.5 % EX AERO
1.0000 | INHALATION_SPRAY | CUTANEOUS | Status: DC | PRN
Start: 2014-05-13 — End: 2014-05-15

## 2014-05-13 MED ORDER — ONDANSETRON HCL 4 MG PO TABS: 4.0000 mg | ORAL_TABLET | ORAL | Status: DC | PRN

## 2014-05-13 MED ORDER — PNEUMOCOCCAL VAC POLYVALENT 25 MCG/0.5ML IJ INJ
0.5000 mL | INJECTION | INTRAMUSCULAR | Status: DC
  Filled 2014-05-13: qty 0.5

## 2014-05-13 MED ORDER — ZOLPIDEM TARTRATE 5 MG PO TABS: 5.0000 mg | ORAL_TABLET | Freq: Every evening | ORAL | Status: DC | PRN

## 2014-05-13 MED ORDER — LANOLIN HYDROUS EX OINT: TOPICAL_OINTMENT | CUTANEOUS | Status: DC | PRN

## 2014-05-13 MED ORDER — PRENATAL MULTIVITAMIN CH
1.0000 | ORAL_TABLET | Freq: Every day | ORAL | Status: DC
  Administered 2014-05-14: 1 via ORAL
  Filled 2014-05-13: qty 1

## 2014-05-13 MED ORDER — ONDANSETRON HCL 4 MG/2ML IJ SOLN: 4.0000 mg | INTRAMUSCULAR | Status: DC | PRN

## 2014-05-13 MED ORDER — SIMETHICONE 80 MG PO CHEW: 80.0000 mg | CHEWABLE_TABLET | ORAL | Status: DC | PRN

## 2014-05-13 MED ORDER — WITCH HAZEL-GLYCERIN EX PADS
1.0000 | MEDICATED_PAD | CUTANEOUS | Status: DC | PRN
Start: 2014-05-13 — End: 2014-05-15

## 2014-05-13 MED ORDER — DIBUCAINE 1 % RE OINT: 1.0000 "application " | TOPICAL_OINTMENT | RECTAL | Status: DC | PRN

## 2014-05-13 NOTE — Progress Notes (Signed)
Meghan Bullock is a 23 y.o. G3P0020 at 2233w4d admitted for PROM  Subjective:  Feeling rectal pressure. Comfortable. No complaints.   Objective: BP 123/78  Pulse 89  Temp(Src) 98.5 F (36.9 C) (Oral)  Resp 18  Ht 5\' 4"  (1.626 m)  Wt 77.111 kg (170 lb)  BMI 29.17 kg/m2  SpO2 97%  LMP 10/10/2013 I/O last 3 completed shifts: In: -  Out: 2600 [Urine:2600]    FHT:  FHR: 130s bpm, variability: moderate,  accelerations:  Present,  decelerations:  1 variable 45min ago UC:  Adequate MVU 200s SVE:   Dilation: 7.5 Effacement (%): 90 Station: 0 Exam by:: N Deal RN  Labs: Lab Results  Component Value Date   WBC 13.9* 05/12/2014   HGB 10.5* 05/12/2014   HCT 31.9* 05/12/2014   MCV 84.4 05/12/2014   PLT 259 05/12/2014    Assessment / Plan: Induction of labor due to PROM,  progressing well on pitocin  Labor: changing on pitocin. Concern for PPH given prolonged induction. Low threshold for cytotec Fetal Wellbeing:  Category II Pain Control:  Epidural I/D:  n/a Anticipated MOD:  NSVD  ODOM, MICHAEL RYAN 05/13/2014, 11:47 AM

## 2014-05-13 NOTE — Progress Notes (Signed)
Meghan SamsSarah E Bullock is a 23 y.o. G3P0020 at 7773w4d admitted for PROM  Subjective:  Doing well.  Still having some pressure. +FM.   Objective: BP 91/74  Pulse 77  Temp(Src) 98.5 F (36.9 C) (Oral)  Resp 18  Ht 5\' 4"  (1.626 m)  Wt 77.111 kg (170 lb)  BMI 29.17 kg/m2  SpO2 97%  LMP 10/10/2013 I/O last 3 completed shifts: In: -  Out: 2600 [Urine:2600]    FHT:  FHR: 140 bpm, variability: moderate,  accelerations:  Present,  decelerations:  Present early UC:   Occasionally adequate, other times inadequate.  SVE:   Dilation: 6.5 Effacement (%): 90 Station: -1 Exam by:: Dr Reola CalkinsBeck   Labs: Lab Results  Component Value Date   WBC 13.9* 05/12/2014   HGB 10.5* 05/12/2014   HCT 31.9* 05/12/2014   MCV 84.4 05/12/2014   PLT 259 05/12/2014    Assessment / Plan: Induction of labor due to PROM,  progressing well on pitocin  Labor: now finally effaced and feeling like a laboring cervix. cont to increase pitocin Fetal Wellbeing:  Category II Pain Control:  Epidural I/D:  n/a Anticipated MOD:  NSVD  Meghan Bullock L 05/13/2014, 7:51 AM

## 2014-05-13 NOTE — Progress Notes (Signed)
Meghan Bullock is a 23 y.o. G3P0020 at 6931w4d admitted for PROM  Subjective: Feeling pain and increased pressure.   Objective: BP 130/76  Pulse 70  Temp(Src) 98.5 F (36.9 C) (Oral)  Resp 18  Ht 5\' 4"  (1.626 m)  Wt 77.111 kg (170 lb)  BMI 29.17 kg/m2  SpO2 85%  LMP 10/10/2013 I/O last 3 completed shifts: In: -  Out: 2600 [Urine:2600]    FHT:  FHR: 135 bpm, variability: moderate,  accelerations:  Present,  decelerations:  Absent UC:   regular, every 2 minutes SVE:   Dilation: 8 Effacement (%): 90 Station: +1 Exam by:: Dr Ike Benedom  Labs: Lab Results  Component Value Date   WBC 13.9* 05/12/2014   HGB 10.5* 05/12/2014   HCT 31.9* 05/12/2014   MCV 84.4 05/12/2014   PLT 259 05/12/2014    Assessment / Plan: IOL due to PROM, progressing on pit.   Labor: changes on pit.  Fetal Wellbeing:  Category I Pain Control:  Epidural Anticipated MOD:  NSVD  [name], [name] 05/13/2014, 1:28 PM

## 2014-05-13 NOTE — Progress Notes (Signed)
Meghan SamsSarah E XXXBartenfield is a 23 y.o. G3P0020 at 5924w4d admitted for PROM  Subjective: Feeling the urge to push   Objective: BP 118/65  Pulse 82  Temp(Src) 98.9 F (37.2 C) (Oral)  Resp 18  Ht 5\' 4"  (1.626 m)  Wt 77.111 kg (170 lb)  BMI 29.17 kg/m2  SpO2 85%  LMP 10/10/2013 I/O last 3 completed shifts: In: -  Out: 2600 [Urine:2600] Total I/O In: -  Out: 1300 [Urine:1300]  FHT:  FHR: 135 bpm, variability: moderate,  accelerations:  Present,  decelerations:  Absent UC:   regular, every 2 minutes SVE:   Dilation: Lip/rim Effacement (%): 90 Station: +1 Exam by:: N Deal RN  Labs: Lab Results  Component Value Date   WBC 13.9* 05/12/2014   HGB 10.5* 05/12/2014   HCT 31.9* 05/12/2014   MCV 84.4 05/12/2014   PLT 259 05/12/2014    Assessment / Plan: IOL due to PROM  Labor: Starting to push  Fetal Wellbeing:  Category I Pain Control:  Epidural I/D:  GBs neg Anticipated MOD:  NSVD  [name], [name] 05/13/2014, 3:45 PM

## 2014-05-13 NOTE — Anesthesia Postprocedure Evaluation (Signed)
Anesthesia Post Note  Patient: Meghan SamsSarah E XXXBartenfield  Procedure(s) Performed: * No procedures listed *  Anesthesia type: Epidural  Patient location: Mother/Baby  Post pain: Pain level controlled  Post assessment: Post-op Vital signs reviewed  Last Vitals:  Filed Vitals:   05/13/14 1947  BP: 132/80  Pulse: 94  Temp:   Resp: 20    Post vital signs: Reviewed  Level of consciousness: awake  Complications: No apparent anesthesia complications

## 2014-05-13 NOTE — Progress Notes (Signed)
Meghan Bullock is a 23 y.o. G3P0020 at 7324w4d admitted for PROM  Subjective:  Doing well.  Still having some pressure. +FM. Epidural in place, comfortable  Objective: BP 113/61  Pulse 73  Temp(Src) 98.5 F (36.9 C) (Oral)  Resp 18  Ht 5\' 4"  (1.626 m)  Wt 77.111 kg (170 lb)  BMI 29.17 kg/m2  SpO2 97%  LMP 10/10/2013 I/O last 3 completed shifts: In: -  Out: 2600 [Urine:2600]    FHT:  FHR: 140 bpm, variability: moderate,  accelerations:  Absent,  decelerations:  Present early UC:   Occasionally adequate, other times inadequate.  SVE:   Dilation: 6.5 Effacement (%): 90 Station: -1 Exam by:: Dr Reola CalkinsBeck   Labs: Lab Results  Component Value Date   WBC 13.9* 05/12/2014   HGB 10.5* 05/12/2014   HCT 31.9* 05/12/2014   MCV 84.4 05/12/2014   PLT 259 05/12/2014    Assessment / Plan: Induction of labor due to PROM,  progressing well on pitocin, introduced myself to patient  Labor: now finally effaced and feeling like a laboring cervix. cont to increase pitocin Fetal Wellbeing:  Category II Pain Control:  Epidural I/D:  n/a Anticipated MOD:  NSVD  Meghan Bullock, Meghan Bullock 05/13/2014, 9:39 AM

## 2014-05-13 NOTE — Progress Notes (Signed)
Meghan Bullock is a 23 y.o. G3P0020 at 5232w4d admitted for PROM  Subjective:  Feeling some pressure. +FM   Objective: BP 121/75  Pulse 80  Temp(Src) 97.8 F (36.6 C) (Oral)  Resp 20  Ht 5\' 4"  (1.626 m)  Wt 77.111 kg (170 lb)  BMI 29.17 kg/m2  SpO2 97%  LMP 10/10/2013   Total I/O In: -  Out: 900 [Urine:900]  FHT:  FHR: 140 bpm, variability: moderate,  accelerations:  Present,  decelerations:  Absent UC:   regular, every 2-4 minutes SVE:   Dilation: 5.5 Effacement (%): 80 Station: -2 Exam by:: h stone rnc  Labs: Lab Results  Component Value Date   WBC 13.9* 05/12/2014   HGB 10.5* 05/12/2014   HCT 31.9* 05/12/2014   MCV 84.4 05/12/2014   PLT 259 05/12/2014    Assessment / Plan: Induction of labor due to PROM,  progressing well on pitocin  Labor: IUPC placed without difficulty.  will cont to increase pit until adequate Fetal Wellbeing:  Category I Pain Control:  Epidural I/D:  n/a Anticipated MOD:  NSVD  Meghan Bullock 05/13/2014, 1:05 AM

## 2014-05-14 MED ORDER — IBUPROFEN 600 MG PO TABS
600.0000 mg | ORAL_TABLET | Freq: Four times a day (QID) | ORAL | Status: DC | PRN
  Administered 2014-05-14: 600 mg via ORAL
  Filled 2014-05-14: qty 1

## 2014-05-14 NOTE — Progress Notes (Addendum)
CSW met with pt assess her social situation briefly, prior to delivery.  Pt was recently transferred to [facility] jail on Sunday, May 11, 2014 from [facility] in [place] to attend a court scheduled for today.  Pt is hopeful that she will be released into "drug court" where she is drug tested 3 times a week & attend NA meeting often.  Pt has an extensive substance abuse history.  Pt identified her grandmother, Donae Kueker as the person she wants to care for her baby during incarceration.  CSW met with Izora Gala yesterday & explained that Child Protective Service, (CPS) would be contacted, since that agency has to approve the plan.  Pt & MGM were very understanding.  CSW also provided the family with pictures taken by this writer @ pt's request.   CSW made a report to [facility] CPS this morning.  CSW spoke with the guards in pt's room & they are waiting to hear from pt's attorney on the status of court date scheduled for today.  At this point the guards do not plan to take pt court.  FOB died from an overdose in 2023/04/30.  His mother, Ivin Booty lives with MGM & is also an appropriate support person.  MGM was given the support person bracelet & will be allowed to visit with infant in the nursery.  Pt can not have any visitors, per [facility] guards.  CSW will continue to follow & facilitate appropriate discharge of infant.

## 2014-05-14 NOTE — Discharge Summary (Deleted)
Obstetric Discharge Summary Reason for Admission: onset of labor Prenatal Procedures: none Intrapartum Procedures: spontaneous vaginal delivery Postpartum Procedures: none Complications-Operative and Postpartum: none Hemoglobin  Date Value Ref Range Status  05/12/2014 10.5* 12.0 - 15.0 g/dL Final     HCT  Date Value Ref Range Status  05/12/2014 31.9* 36.0 - 46.0 % Final    Physical Exam:  General: alert, cooperative and appears stated age Lochia: appropriate Uterine Fundus: firm DVT Evaluation: No evidence of DVT seen on physical exam. Negative Homan's sign. No cords or calf tenderness. No significant calf/ankle edema.  Discharge Diagnoses: Term Pregnancy-delivered  Discharge Information: Date: 05/14/2014 Activity: pelvic rest Diet: routine Medications: PNV and tylenol Condition: stable Instructions: refer to practice specific booklet Discharge to: Court visit Follow-up Information   Follow up with [facility] In 4 weeks. (For Postpartum Visit)    Specialty:  Obstetrics and Gynecology   Contact information:   [place] [id number] [phone]      Newborn Data: Live born female  Birth Weight: 7 lb 3.2 oz (3265 g) APGAR: 7, 8  Stay for observation in nursery, Mother will return to room in with infant until discharge. Mother requests early discharge because of a court date to be released from prison. Pt strongly desires to make this appointment because to reschedule will delay her release and ability to care for child. SW has met with patient, and will follow up with after discharge while infant is still here.  Fredrik Rigger 05/14/2014, 6:37 AM

## 2014-05-14 NOTE — Addendum Note (Signed)
Addendum created 05/14/14 0859 by Earmon PhoenixValerie P Trinidy Masterson, CRNA   Modules edited: Charges VN, Notes Section   Notes Section:  File: [id number]

## 2014-05-14 NOTE — Progress Notes (Signed)
Ur chart review completed.  

## 2014-05-14 NOTE — Anesthesia Postprocedure Evaluation (Signed)
  Anesthesia Post-op Note  Patient: Meghan Bullock  Procedure(s) Performed: * No procedures listed *  Patient Location: Mother/Baby  Anesthesia Type:Epidural  Level of Consciousness: awake, alert , oriented and patient cooperative  Airway and Oxygen Therapy: Patient Spontanous Breathing  Post-op Pain: mild  Post-op Assessment: Patient's Cardiovascular Status Stable, Respiratory Function Stable, No headache, No backache, No residual numbness and No residual motor weakness  Post-op Vital Signs: stable  Last Vitals:  Filed Vitals:   05/14/14 0545  BP: 128/82  Pulse: 75  Temp: 36.8 C  Resp: 20    Complications: No apparent anesthesia complications

## 2014-05-14 NOTE — Progress Notes (Signed)
Post Partum Day 1 Subjective: up ad lib, voiding and tolerating PO  Objective: Blood pressure 128/82, pulse 75, temperature 98.2 F (36.8 C), temperature source Oral, resp. rate 20, height 5\' 4"  (1.626 m), weight 77.111 kg (170 lb), last menstrual period 10/10/2013, SpO2 99.00%, unknown if currently breastfeeding.  Physical Exam:  General: alert, cooperative and no distress Lochia: appropriate Uterine Fundus: firm Incision: n/a DVT Evaluation: No evidence of DVT seen on physical exam.   Recent Labs  05/12/14 0705  HGB 10.5*  HCT 31.9*    Assessment/Plan: Plan for discharge tomorrow Patient with fever yesterday afternoon. Afebrile since. No ABX given. Continue to monitor.  Bottle but interested in breast feeding  Unsure of contraception at this time.    LOS: 2 days   [name], [name] 05/14/2014, 8:00 AM   I spoke with and examined patient and agree with resident's note and plan of care.  Tawana ScaleMichael Ryan Odom, MD OB Fellow 05/14/2014 9:24 AM

## 2014-05-15 MED ORDER — IBUPROFEN 600 MG PO TABS: 600.0000 mg | ORAL_TABLET | Freq: Four times a day (QID) | ORAL | Status: DC | PRN

## 2014-05-15 NOTE — Discharge Instructions (Signed)
Vaginal Delivery °Care After °Refer to this sheet in the next few weeks. These discharge instructions provide you with information on caring for yourself after delivery. Your caregiver may also give you specific instructions. Your treatment has been planned according to the most current medical practices available, but problems sometimes occur. Call your caregiver if you have any problems or questions after you go home. °HOME CARE INSTRUCTIONS °· Take over-the-counter or prescription medicines only as directed by your caregiver or pharmacist. °· Do not drink alcohol, especially if you are breastfeeding or taking medicine to relieve pain. °· Do not chew or smoke tobacco. °· Do not use illegal drugs. °· Continue to use good perineal care. Good perineal care includes: °· Wiping your perineum from front to back. °· Keeping your perineum clean. °· Do not use tampons or douche until your caregiver says it is okay. °· Shower, wash your hair, and take tub baths as directed by your caregiver. °· Wear a well-fitting bra that provides breast support. °· Eat healthy foods. °· Drink enough fluids to keep your urine clear or pale yellow. °· Eat high-fiber foods such as whole grain cereals and breads, brown rice, beans, and fresh fruits and vegetables every day. These foods may help prevent or relieve constipation. °· Follow your cargiver's recommendations regarding resumption of activities such as climbing stairs, driving, lifting, exercising, or traveling. °· Talk to your caregiver about resuming sexual activities. Resumption of sexual activities is dependent upon your risk of infection, your rate of healing, and your comfort and desire to resume sexual activity. °· Try to have someone help you with your household activities and your newborn for at least a few days after you leave the hospital. °· Rest as much as possible. Try to rest or take a nap when your newborn is sleeping. °· Increase your activities gradually. °· Keep all  of your scheduled postpartum appointments. It is very important to keep your scheduled follow-up appointments. At these appointments, your caregiver will be checking to make sure that you are healing physically and emotionally. °SEEK MEDICAL CARE IF:  °· You are passing large clots from your vagina. Save any clots to show your caregiver. °· You have a foul smelling discharge from your vagina. °· You have trouble urinating. °· You are urinating frequently. °· You have pain when you urinate. °· You have a change in your bowel movements. °· You have increasing redness, pain, or swelling near your vaginal incision (episiotomy) or vaginal tear. °· You have pus draining from your episiotomy or vaginal tear. °· Your episiotomy or vaginal tear is separating. °· You have painful, hard, or reddened breasts. °· You have a severe headache. °· You have blurred vision or see spots. °· You feel sad or depressed. °· You have thoughts of hurting yourself or your newborn. °· You have questions about your care, the care of your newborn, or medicines. °· You are dizzy or lightheaded. °· You have a rash. °· You have nausea or vomiting. °· You were breastfeeding and have not had a menstrual period within 12 weeks after you stopped breastfeeding. °· You are not breastfeeding and have not had a menstrual period by the 12th week after delivery. °· You have a fever. °SEEK IMMEDIATE MEDICAL CARE IF:  °· You have persistent pain. °· You have chest pain. °· You have shortness of breath. °· You faint. °· You have leg pain. °· You have stomach pain. °· Your vaginal bleeding saturates two or more sanitary pads   in 1 hour. MAKE SURE YOU:   Understand these instructions.  Will watch your condition.  Will get help right away if you are not doing well or get worse. Document Released: 11/11/2000 Document Revised: 08/08/2012 Document Reviewed: 07/11/2012 [facility] CenterExitCare Patient Information 2014 [place]ExitCare, [place]LLC.  Vaginal Delivery Care After Refer to  this sheet in the next few weeks. These discharge instructions provide you with information on caring for yourself after delivery. Your caregiver may also give you specific instructions. Your treatment has been planned according to the most current medical practices available, but problems sometimes occur. Call your caregiver if you have any problems or questions after you go home. HOME CARE INSTRUCTIONS  Take over-the-counter or prescription medicines only as directed by your caregiver or pharmacist.  Do not drink alcohol, especially if you are breastfeeding or taking medicine to relieve pain.  Do not chew or smoke tobacco.  Do not use illegal drugs.  Continue to use good perineal care. Good perineal care includes:  Wiping your perineum from front to back.  Keeping your perineum clean.  Do not use tampons or douche until your caregiver says it is okay.  Shower, wash your hair, and take tub baths as directed by your caregiver.  Wear a well-fitting bra that provides breast support.  Eat healthy foods.  Drink enough fluids to keep your urine clear or pale yellow.  Eat high-fiber foods such as whole grain cereals and breads, brown rice, beans, and fresh fruits and vegetables every day. These foods may help prevent or relieve constipation.  Follow your cargiver's recommendations regarding resumption of activities such as climbing stairs, driving, lifting, exercising, or traveling.  Talk to your caregiver about resuming sexual activities. Resumption of sexual activities is dependent upon your risk of infection, your rate of healing, and your comfort and desire to resume sexual activity.  Try to have someone help you with your household activities and your newborn for at least a few days after you leave the hospital.  Rest as much as possible. Try to rest or take a nap when your newborn is sleeping.  Increase your activities gradually.  Keep all of your scheduled postpartum  appointments. It is very important to keep your scheduled follow-up appointments. At these appointments, your caregiver will be checking to make sure that you are healing physically and emotionally. SEEK MEDICAL CARE IF:   You are passing large clots from your vagina. Save any clots to show your caregiver.  You have a foul smelling discharge from your vagina.  You have trouble urinating.  You are urinating frequently.  You have pain when you urinate.  You have a change in your bowel movements.  You have increasing redness, pain, or swelling near your vaginal incision (episiotomy) or vaginal tear.  You have pus draining from your episiotomy or vaginal tear.  Your episiotomy or vaginal tear is separating.  You have painful, hard, or reddened breasts.  You have a severe headache.  You have blurred vision or see spots.  You feel sad or depressed.  You have thoughts of hurting yourself or your newborn.  You have questions about your care, the care of your newborn, or medicines.  You are dizzy or lightheaded.  You have a rash.  You have nausea or vomiting.  You were breastfeeding and have not had a menstrual period within 12 weeks after you stopped breastfeeding.  You are not breastfeeding and have not had a menstrual period by the 12th week after delivery.  You  have a fever. SEEK IMMEDIATE MEDICAL CARE IF:   You have persistent pain.  You have chest pain.  You have shortness of breath.  You faint.  You have leg pain.  You have stomach pain.  Your vaginal bleeding saturates two or more sanitary pads in 1 hour. MAKE SURE YOU:   Understand these instructions.  Will watch your condition.  Will get help right away if you are not doing well or get worse. Document Released: 11/11/2000 Document Revised: 08/08/2012 Document Reviewed: 07/11/2012 [facility] CenterExitCare Patient Information 2015 [place]boroExitCare, MarylandLLC. This information is not intended to replace advice given to you by  your health care provider. Make sure you discuss any questions you have with your health care provider.

## 2014-05-15 NOTE — Discharge Summary (Signed)
Obstetric Discharge Summary Reason for Admission: onset of labor Prenatal Procedures: none Intrapartum Procedures: spontaneous vaginal delivery Postpartum Procedures: none Complications-Operative and Postpartum: none Hemoglobin  Date Value Ref Range Status  05/12/2014 10.5* 12.0 - 15.0 g/dL Final     HCT  Date Value Ref Range Status  05/12/2014 31.9* 36.0 - 46.0 % Final    Meghan Bullock is a 23 y.o. femaleG3P0020 @[redacted]w[redacted]d  presented with PPROM. At 4:15 PM (6/16) a viable and healthy female was delivered.  Presented with shoulder dystocia. Baby was delivered with third techniqued tried [name](Rubin 2) with 60 seconds between head delivery and body. Patient is currently incarcerated and awaiting trial date. Baby will be taken care by patient's grandmother [facility](MGM). Will bottle feed and mirena for contraception.   Physical Exam:  General: alert, cooperative and no distress Lochia: appropriate Uterine Fundus: firm Incision: n/a DVT Evaluation: No evidence of DVT seen on physical exam.  Discharge Diagnoses: Term Pregnancy-delivered  Discharge Information: Date: 05/15/2014 Activity: unrestricted Diet: routine Medications: Ibuprofen Condition: stable Instructions: refer to practice specific booklet Discharge to: home Follow-up Information   [facility] (For [facility] Visit)    Specialty:  Obstetrics and Gynecology   Contact information:   [address] [place] [id number] [phone]      Newborn Data: Live born female  Birth Weight: 7 lb 3.2 oz (3265 g) APGAR: 7, 8  Home with mother.  [name], [name] 05/15/2014, 7:43 AM  I have seen and examined this patient and I agree with the above. Cam HaiSHAW, KIMBERLY CNM 8:53 AM 05/15/2014

## 2014-05-15 NOTE — Progress Notes (Signed)
CSW received call from MOB's bedside RN stating MOB has questions for CSW.  CSW met with MOB who wants to know CPS plan.  CSW left message for Butch Penny B./CPS requesting a call back regarding plan for baby.  CSW informed MOB that CSW will update her when CSW receives a call back from [place] worker.

## 2014-05-20 ENCOUNTER — Encounter (HOSPITAL_COMMUNITY): Payer: Self-pay

## 2014-06-19 ENCOUNTER — Ambulatory Visit: Payer: Self-pay | Admitting: Obstetrics & Gynecology

## 2014-07-02 ENCOUNTER — Emergency Department (HOSPITAL_COMMUNITY)
Admission: EM | Admit: 2014-07-02 | Discharge: 2014-07-03 | Disposition: A | Discharge status: Home / Self Care | Payer: Medicaid Other | Attending: Emergency Medicine | Admitting: Emergency Medicine

## 2014-07-02 ENCOUNTER — Encounter (HOSPITAL_COMMUNITY): Payer: Self-pay | Admitting: Emergency Medicine

## 2014-07-02 DIAGNOSIS — F17200 Nicotine dependence, unspecified, uncomplicated: Secondary | ICD-10-CM | POA: Insufficient documentation

## 2014-07-02 DIAGNOSIS — J45909 Unspecified asthma, uncomplicated: Secondary | ICD-10-CM | POA: Insufficient documentation

## 2014-07-02 DIAGNOSIS — Z3202 Encounter for pregnancy test, result negative: Secondary | ICD-10-CM | POA: Insufficient documentation

## 2014-07-02 DIAGNOSIS — Z8742 Personal history of other diseases of the female genital tract: Secondary | ICD-10-CM | POA: Insufficient documentation

## 2014-07-02 DIAGNOSIS — F1110 Opioid abuse, uncomplicated: Secondary | ICD-10-CM | POA: Insufficient documentation

## 2014-07-02 DIAGNOSIS — Z8619 Personal history of other infectious and parasitic diseases: Secondary | ICD-10-CM | POA: Insufficient documentation

## 2014-07-02 DIAGNOSIS — Z8659 Personal history of other mental and behavioral disorders: Secondary | ICD-10-CM | POA: Insufficient documentation

## 2014-07-02 LAB — RAPID URINE DRUG SCREEN, HOSP PERFORMED
Amphetamines: NOT DETECTED
BENZODIAZEPINES: POSITIVE — AB
Barbiturates: NOT DETECTED
Cocaine: NOT DETECTED
Opiates: POSITIVE — AB
Tetrahydrocannabinol: POSITIVE — AB

## 2014-07-02 LAB — CBC
HCT: 37.9 % (ref 36.0–46.0)
Hemoglobin: 11.7 g/dL — ABNORMAL LOW (ref 12.0–15.0)
MCH: 26.1 pg (ref 26.0–34.0)
MCHC: 30.9 g/dL (ref 30.0–36.0)
MCV: 84.4 fL (ref 78.0–100.0)
Platelets: 337 10*3/uL (ref 150–400)
RBC: 4.49 MIL/uL (ref 3.87–5.11)
RDW: 15 % (ref 11.5–15.5)
WBC: 12.1 10*3/uL — ABNORMAL HIGH (ref 4.0–10.5)

## 2014-07-02 LAB — POC URINE PREG, ED: PREG TEST UR: NEGATIVE

## 2014-07-02 MED ORDER — ALUM & MAG HYDROXIDE-SIMETH 200-200-20 MG/5ML PO SUSP: 30.0000 mL | ORAL | Status: DC | PRN

## 2014-07-02 MED ORDER — LORAZEPAM 1 MG PO TABS: 1.0000 mg | ORAL_TABLET | Freq: Three times a day (TID) | ORAL | Status: DC | PRN

## 2014-07-02 MED ORDER — ZOLPIDEM TARTRATE 10 MG PO TABS: 10.0000 mg | ORAL_TABLET | Freq: Every evening | ORAL | Status: DC | PRN

## 2014-07-02 MED ORDER — ACETAMINOPHEN 325 MG PO TABS: 650.0000 mg | ORAL_TABLET | ORAL | Status: DC | PRN

## 2014-07-02 MED ORDER — NICOTINE 21 MG/24HR TD PT24: 21.0000 mg | MEDICATED_PATCH | Freq: Every day | TRANSDERMAL | Status: DC

## 2014-07-02 MED ORDER — ONDANSETRON HCL 4 MG PO TABS: 4.0000 mg | ORAL_TABLET | Freq: Three times a day (TID) | ORAL | Status: DC | PRN

## 2014-07-02 NOTE — ED Notes (Addendum)
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 Denies tremors, anxiety, SOB. Reports "feeling sleepy." Denies SI/HI. Was treated for "pill addiction" in 2012. Recently lost her child's father this year and her own father last year. Patient appears nervous. Ambulatory and moving all extremities. Neurologically intact. A&Ox4. In NAD. MD Lynelle DoctorKnapp at bedside.

## 2014-07-02 NOTE — ED Notes (Signed)
Bed: WLPT3 Expected date:  Expected time:  Means of arrival:  Comments: EMS/wants detox 

## 2014-07-02 NOTE — Discharge Instructions (Signed)
You do not qualify for admission to detox tonight. Keep your appointment next week for admission to detox at [facility]. Return to the ED if you feel like you are going to hurt yourself or harm someone else.  Opioid Use Disorder Opioid use disorder is a mental disorder. It is the continued nonmedical use of opioids in spite of risks to health and well-being. Misused opioids include the street drug heroin. They also include pain medicines such as morphine, hydrocodone, oxycodone, and fentanyl. Opioids are very addictive. People who misuse opioids get an exaggerated feeling of well-being. Opioid use disorder often disrupts activities at home, work, or school. It may cause mental or physical problems.  A family history of opioid use disorder puts you at higher risk of it. People with opioid use disorder often misuse other drugs or have mental illness such as depression, posttraumatic stress disorder, or antisocial personality disorder. They also are at risk of suicide and death from overdose. SIGNS AND SYMPTOMS  Signs and symptoms of opioid use disorder include:  Use of opioids in larger amounts or over a longer period than intended.  Unsuccessful attempts to cut down or control opioid use.  A lot of time spent obtaining, using, or recovering from the effects of opioids.  A strong desire or urge to use opioids (craving).  Continued use of opioids in spite of major problems at work, school, or home because of use.  Continued use of opioids in spite of relationship problems because of use.  Giving up or cutting down on important life activities because of opioid use.  Use of opioids over and over in situations when it is physically hazardous, such as driving a car.  Continued use of opioids in spite of a physical problem that is likely related to use. Physical problems can include:  Severe constipation.  Poor nutrition.  Infertility.  Tuberculosis.  Aspiration pneumonia.  Infections such  as human immunodeficiency virus (HIV) and hepatitis (from injecting opioids).  Continued use of opioids in spite of a mental problem that is likely related to use. Mental problems can include:  Depression.  Anxiety.  Hallucinations.  Sleep problems.  Loss of sexual function.  Need to use more and more opioids to get the same effect, or lessened effect over time with use of the same amount (tolerance).  Having withdrawal symptoms when opioid use is stopped, or using opioids to reduce or avoid withdrawal symptoms. Withdrawal symptoms include:  Depressed, anxious, or irritable mood.  Nausea, vomiting, diarrhea, or intestinal cramping.  Muscle aches or spasms.  Excessive tearing or runny nose.  Dilated pupils, sweating, or hairs standing on end.  Yawning.  Fever, raised blood pressure, or fast pulse.  Restlessness or trouble sleeping. This does not apply to people taking opioids for medical reasons only. DIAGNOSIS Opioid use disorder is diagnosed by your health care provider. You may be asked questions about your opioid use and and how it affects your life. A physical exam may be done. A drug screen may be ordered. You may be referred to a mental health professional. The diagnosis of opioid use disorder requires at least two symptoms within 12 months. The type of opioid use disorder you have depends on the number of signs and symptoms you have. The type may be:  Mild. Two or three signs and symptoms.   Moderate. Four or five signs and symptoms.   Severe. Six or more signs and symptoms. TREATMENT  Treatment is usually provided by mental health professionals with  training in substance use disorders.The following options are available:  Detoxification.This is the first step in treatment for withdrawal. It is medically supervised withdrawal with the use of medicines. These medicines lessen withdrawal symptoms. They also raise the chance of becoming opioid free.  Counseling,  also known as talk therapy. Talk therapy addresses the reasons you use opioids. It also addresses ways to keep you from using again (relapse). The goals of talk therapy are to avoid relapse by:  Identifying and avoiding triggers for use.  Finding healthy ways to cope with stress.  Learning how to handle cravings.  Support groups. Support groups provide emotional support, advice, and guidance.  A medicine that blocks opioid receptors in your brain. This medicine can reduce opioid cravings that lead to relapse. This medicine also blocks the desired opioid effect when relapse occurs.  Opioids that are taken by mouth in place of the misused opioid (opioid maintenance treatment). These medicines satisfy cravings but are safer than commonly misused opioids. This often is the best option for people who continue to relapse with other treatments. HOME CARE INSTRUCTIONS   Take medicines only as directed by your health care provider.  Check with your health care provider before starting new medicines.  Keep all follow-up visits as directed by your health care provider. SEEK MEDICAL CARE IF:  You are not able to take your medicines as directed.  Your symptoms get worse. SEEK IMMEDIATE MEDICAL CARE IF:  You have serious thoughts about hurting yourself or others.  You may have taken an overdose of opioids. FOR MORE INFORMATION  National Institute on Drug Abuse: http://www.price-smith.com/www.drugabuse.gov  Substance Abuse and Mental Health Services Administration: SkateOasis.com.ptwww.samhsa.gov Document Released: 09/11/2007 Document Revised: 03/31/2014 Document Reviewed: 11/27/2013 [facility] Patient Information 2015 [place]ExitCare, [place]LLC. This information is not intended to replace advice given to you by your health care provider. Make sure you discuss any questions you have with your health care provider.

## 2014-07-02 NOTE — ED Notes (Addendum)
Pt has in belonging bag:  Schering-PloughBrown sandals, Engineer, miningzebra prints tights, black bra, pink zebra print t-shirt, bright green t-shirt, brown purse, brown wallet, black phone, white and pink earpiece, [place] ID card, one twenty dollar bill, two silver plated rings, two silver earrings.

## 2014-07-02 NOTE — ED Notes (Signed)
Sheriff arrived with IVC papers.
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
## 2014-07-02 NOTE — ED Notes (Addendum)
Pt transported from home by EMS requesting detox heroin. Per EMS pt states she has used 3x today. Per EMS pt does have ankle monitor in place, PD aware. Pt does have an 54mo son at home, grandparents taking care of child. DSS already involved.

## 2014-07-03 LAB — COMPREHENSIVE METABOLIC PANEL
ALBUMIN: 3.8 g/dL (ref 3.5–5.2)
ALT: 14 U/L (ref 0–35)
AST: 14 U/L (ref 0–37)
Alkaline Phosphatase: 99 U/L (ref 39–117)
Anion gap: 14 (ref 5–15)
BUN: 10 mg/dL (ref 6–23)
CALCIUM: 9.2 mg/dL (ref 8.4–10.5)
CO2: 23 meq/L (ref 19–32)
CREATININE: 0.65 mg/dL (ref 0.50–1.10)
Chloride: 100 mEq/L (ref 96–112)
GFR calc Af Amer: >90 mL/min (ref >90)
Glucose, Bld: 104 mg/dL — ABNORMAL HIGH (ref 70–99)
Potassium: 3.8 mEq/L (ref 3.7–5.3)
Sodium: 137 mEq/L (ref 137–147)
Total Bilirubin: <0.2 mg/dL — ABNORMAL LOW (ref 0.3–1.2)
Total Protein: 7.5 g/dL (ref 6.0–8.3)

## 2014-07-03 LAB — ACETAMINOPHEN LEVEL: Acetaminophen (Tylenol), Serum: <15 ug/mL (ref 10–30)

## 2014-07-03 LAB — ETHANOL

## 2014-07-03 LAB — SALICYLATE LEVEL

## 2014-07-03 NOTE — ED Notes (Signed)
Patient denies SI, HI, AVH. Rates anxiety 3/10, depression 0/10.

## 2014-07-03 NOTE — ED Notes (Signed)
Bed[id number] Expected date:  Expected time:  Means of arrival:  Comments[facility]

## 2014-07-08 ENCOUNTER — Encounter (HOSPITAL_COMMUNITY): Payer: Self-pay | Admitting: Emergency Medicine

## 2014-07-08 ENCOUNTER — Emergency Department (HOSPITAL_COMMUNITY)
Admission: EM | Admit: 2014-07-08 | Discharge: 2014-07-08 | Disposition: A | Discharge status: Home / Self Care | Payer: Medicaid Other | Attending: Emergency Medicine | Admitting: Emergency Medicine

## 2014-07-08 DIAGNOSIS — F411 Generalized anxiety disorder: Secondary | ICD-10-CM | POA: Insufficient documentation

## 2014-07-08 DIAGNOSIS — Z79899 Other long term (current) drug therapy: Secondary | ICD-10-CM | POA: Insufficient documentation

## 2014-07-08 DIAGNOSIS — F17200 Nicotine dependence, unspecified, uncomplicated: Secondary | ICD-10-CM | POA: Insufficient documentation

## 2014-07-08 DIAGNOSIS — F1910 Other psychoactive substance abuse, uncomplicated: Secondary | ICD-10-CM

## 2014-07-08 DIAGNOSIS — F319 Bipolar disorder, unspecified: Secondary | ICD-10-CM | POA: Insufficient documentation

## 2014-07-08 DIAGNOSIS — F1120 Opioid dependence, uncomplicated: Secondary | ICD-10-CM | POA: Insufficient documentation

## 2014-07-08 DIAGNOSIS — Z8742 Personal history of other diseases of the female genital tract: Secondary | ICD-10-CM | POA: Insufficient documentation

## 2014-07-08 DIAGNOSIS — F1220 Cannabis dependence, uncomplicated: Secondary | ICD-10-CM | POA: Insufficient documentation

## 2014-07-08 DIAGNOSIS — Z3202 Encounter for pregnancy test, result negative: Secondary | ICD-10-CM | POA: Insufficient documentation

## 2014-07-08 DIAGNOSIS — J45909 Unspecified asthma, uncomplicated: Secondary | ICD-10-CM | POA: Insufficient documentation

## 2014-07-08 DIAGNOSIS — Z8619 Personal history of other infectious and parasitic diseases: Secondary | ICD-10-CM | POA: Insufficient documentation

## 2014-07-08 DIAGNOSIS — F1320 Sedative, hypnotic or anxiolytic dependence, uncomplicated: Secondary | ICD-10-CM | POA: Insufficient documentation

## 2014-07-08 HISTORY — DX: Other psychoactive substance abuse, uncomplicated: F19.10

## 2014-07-08 LAB — BASIC METABOLIC PANEL
ANION GAP: 12 (ref 5–15)
BUN: 7 mg/dL (ref 6–23)
CHLORIDE: 103 meq/L (ref 96–112)
CO2: 26 mEq/L (ref 19–32)
CREATININE: 0.62 mg/dL (ref 0.50–1.10)
Calcium: 9.3 mg/dL (ref 8.4–10.5)
GFR calc non Af Amer: >90 mL/min (ref >90)
Glucose, Bld: 103 mg/dL — ABNORMAL HIGH (ref 70–99)
POTASSIUM: 4.1 meq/L (ref 3.7–5.3)
Sodium: 141 mEq/L (ref 137–147)

## 2014-07-08 LAB — CBC WITH DIFFERENTIAL/PLATELET
BASOS ABS: 0.1 10*3/uL (ref 0.0–0.1)
BASOS PCT: 1 % (ref 0–1)
Eosinophils Absolute: 0.3 10*3/uL (ref 0.0–0.7)
Eosinophils Relative: 3 % (ref 0–5)
HCT: 37.6 % (ref 36.0–46.0)
Hemoglobin: 11.7 g/dL — ABNORMAL LOW (ref 12.0–15.0)
Lymphocytes Relative: 12 % (ref 12–46)
Lymphs Abs: 1 10*3/uL (ref 0.7–4.0)
MCH: 25.9 pg — AB (ref 26.0–34.0)
MCHC: 31.1 g/dL (ref 30.0–36.0)
MCV: 83.4 fL (ref 78.0–100.0)
MONO ABS: 0.8 10*3/uL (ref 0.1–1.0)
Monocytes Relative: 9 % (ref 3–12)
NEUTROS ABS: 6.5 10*3/uL (ref 1.7–7.7)
NEUTROS PCT: 75 % (ref 43–77)
Platelets: 328 10*3/uL (ref 150–400)
RBC: 4.51 MIL/uL (ref 3.87–5.11)
RDW: 15.8 % — AB (ref 11.5–15.5)
WBC: 8.7 10*3/uL (ref 4.0–10.5)

## 2014-07-08 LAB — RAPID URINE DRUG SCREEN, HOSP PERFORMED
Amphetamines: NOT DETECTED
BENZODIAZEPINES: POSITIVE — AB
Barbiturates: NOT DETECTED
COCAINE: NOT DETECTED
Opiates: POSITIVE — AB
Tetrahydrocannabinol: POSITIVE — AB

## 2014-07-08 LAB — ETHANOL: Alcohol, Ethyl (B): <11 mg/dL (ref 0–11)

## 2014-07-08 LAB — PREGNANCY, URINE: Preg Test, Ur: NEGATIVE

## 2014-07-08 MED ORDER — ONDANSETRON 4 MG PO TBDP: 4.0000 mg | ORAL_TABLET | Freq: Once | ORAL | Status: DC

## 2014-07-08 MED ORDER — ONDANSETRON HCL 4 MG/2ML IJ SOLN: 4.0000 mg | Freq: Once | INTRAMUSCULAR | Status: DC

## 2014-07-08 NOTE — ED Notes (Signed)
[facility].  Pt states she is wanting detox from heroin, xanax and marijuana.  Pt states she used $40 worth of heroin last night.  Pt is alert and oriented.

## 2014-07-08 NOTE — ED Provider Notes (Signed)
No SI/HI/hallucinations; resting comfortably; no tremors or vomiting; doubt severe withdrawal; d/w TTS recs discharge med [facility]  Hurman HornJohn M Marian Grandt, MD 07/08/14 [phone]

## 2014-07-08 NOTE — ED Notes (Signed)
[facility]. States they sent her here to be medically cleared because she told them she had been using 30 dollars worth of heroin a day for the past 4 days. She states she last used yesterday 8/10 around 4 pm. She denies any withdrawal symptoms. States prior to the past 4 days she has had 8 months clean time. She states she is on probation. She had her 2 month old in67fant while in jail. States dss is involved with her child. States she just wants to be medically cleared so [facility]

## 2014-07-08 NOTE — ED Notes (Signed)
tts in progress 

## 2014-07-08 NOTE — ED Provider Notes (Signed)
CSN: [id number]     Arrival date & time 07/08/14  1008 History   First MD Initiated Contact with Patient 07/08/14 1025     Chief Complaint  Patient presents with  . Addiction Problem     HPI Pt was seen at 1040. Per pt, c/o gradual onset and persistence of constant polysubstance abuse for the past several years. Pt states she went to [facility] today requesting detox from heroin, xanax, marijuana. Pt states she then was sent to the ED for further evaluation. LD heroin last night. Denies withdrawal symptoms at this time. Denies CP/palpitations, no SOB/cough, no abd pain, no N/V/D, no fevers, no SI, no HI, no hallucinations.    Past Medical History  Diagnosis Date  . Mononucleosis   . Ovarian cyst   . Chlamydia   . Bipolar 1 disorder   . Anxiety   . Heroin abuse   . Asthma   . Polysubstance abuse    Past Surgical History  Procedure Laterality Date  . No past surgeries      History  Substance Use Topics  . Smoking status: Current Every Day Smoker -- 1.00 packs/day for 5 years    Types: Cigarettes  . Smokeless tobacco: Not on file  . Alcohol Use: No   OB History   Grav Para Term Preterm Abortions TAB SAB Ect Mult Living   3 1  1 2  2   1      Review of Systems ROS: Statement: All systems negative except as marked or noted in the HPI; Constitutional: Negative for fever and chills. ; ; Eyes: Negative for eye pain, redness and discharge. ; ; ENMT: Negative for ear pain, hoarseness, nasal congestion, sinus pressure and sore throat. ; ; Cardiovascular: Negative for chest pain, palpitations, diaphoresis, dyspnea and peripheral edema. ; ; Respiratory: Negative for cough, wheezing and stridor. ; ; Gastrointestinal: Negative for nausea, vomiting, diarrhea, abdominal pain, blood in stool, hematemesis, jaundice and rectal bleeding. . ; ; Genitourinary: Negative for dysuria, flank pain and hematuria. ; ; Musculoskeletal: Negative for back pain and neck pain. Negative for swelling and trauma.; ;  Skin: Negative for pruritus, rash, abrasions, blisters, bruising and skin lesion.; ; Neuro: Negative for headache, lightheadedness and neck stiffness. Negative for weakness, altered level of consciousness , altered mental status, extremity weakness, paresthesias, involuntary movement, seizure and syncope.; Psych:  No SI, no SA, no HI, no hallucinations.      Allergies  Hydrocodone; Advil; and Tramadol  Home Medications   Prior to Admission medications   Medication Sig Start Date End Date Taking? Authorizing Provider  sertraline (ZOLOFT) 50 MG tablet Take 50 mg by mouth daily.   Yes Historical Provider, MD   BP 123/85  Temp(Src) 98.6 F (37 C) (Oral)  Resp 18  Ht 5\' 1"  (1.549 m)  Wt 160 lb (72.576 kg)  BMI 30.25 kg/m2  SpO2 100%  LMP 06/27/2014 Physical Exam 1045: Physical examination:  Nursing notes reviewed; Vital signs and O2 SAT reviewed;  Constitutional: Well developed, Well nourished, Well hydrated, In no acute distress; Head:  Normocephalic, atraumatic; Eyes: EOMI, PERRL, No scleral icterus; ENMT: Mouth and pharynx normal, Mucous membranes moist; Neck: Supple, Full range of motion, No lymphadenopathy; Cardiovascular: Regular rate and rhythm, No murmur, rub, or gallop; Respiratory: Breath sounds clear & equal bilaterally, No rales, rhonchi, wheezes.  Speaking full sentences with ease, Normal respiratory effort/excursion; Chest: Nontender, Movement normal; Abdomen: Soft, Nontender, Nondistended, Normal bowel sounds; Genitourinary: No CVA tenderness; Extremities: Pulses normal, No  tenderness, No edema, No calf edema or asymmetry.; Neuro: AA&Ox3, Major CN grossly intact.  Speech clear. No gross focal motor or sensory deficits in extremities. Climbs on and off stretcher easily by herself. Gait steady.; Skin: Color normal, Warm, Dry.; Psych:  Denies SI, no psychosis.     ED Course  Procedures     MDM  MDM Reviewed: previous chart, nursing note and vitals Reviewed previous:  labs Interpretation: labs    Results for orders placed during the hospital encounter of 07/08/14  ETHANOL      Result Value Ref Range   Alcohol, Ethyl (B) <11  0 - 11 mg/dL  URINE RAPID DRUG SCREEN (HOSP PERFORMED)      Result Value Ref Range   Opiates POSITIVE (*) NONE DETECTED   Cocaine NONE DETECTED  NONE DETECTED   Benzodiazepines POSITIVE (*) NONE DETECTED   Amphetamines NONE DETECTED  NONE DETECTED   Tetrahydrocannabinol POSITIVE (*) NONE DETECTED   Barbiturates NONE DETECTED  NONE DETECTED  PREGNANCY, URINE      Result Value Ref Range   Preg Test, Ur NEGATIVE  NEGATIVE  CBC WITH DIFFERENTIAL      Result Value Ref Range   WBC 8.7  4.0 - 10.5 K/uL   RBC 4.51  3.87 - 5.11 MIL/uL   Hemoglobin 11.7 (*) 12.0 - 15.0 g/dL   HCT 16.137.6  09.636.0 - 04.546.0 %   MCV 83.4  78.0 - 100.0 fL   MCH 25.9 (*) 26.0 - 34.0 pg   MCHC 31.1  30.0 - 36.0 g/dL   RDW 40.915.8 (*) 81.111.5 - 91.415.5 %   Platelets 328  150 - 400 K/uL   Neutrophils Relative % 75  43 - 77 %   Lymphocytes Relative 12  12 - 46 %   Monocytes Relative 9  3 - 12 %   Eosinophils Relative 3  0 - 5 %   Basophils Relative 1  0 - 1 %   Neutro Abs 6.5  1.7 - 7.7 K/uL   Lymphs Abs 1.0  0.7 - 4.0 K/uL   Monocytes Absolute 0.8  0.1 - 1.0 K/uL   Eosinophils Absolute 0.3  0.0 - 0.7 K/uL   Basophils Absolute 0.1  0.0 - 0.1 K/uL  BASIC METABOLIC PANEL      Result Value Ref Range   Sodium 141  137 - 147 mEq/L   Potassium 4.1  3.7 - 5.3 mEq/L   Chloride 103  96 - 112 mEq/L   CO2 26  19 - 32 mEq/L   Glucose, Bld 103 (*) 70 - 99 mg/dL   BUN 7  6 - 23 mg/dL   Creatinine, Ser 7.820.62  0.50 - 1.10 mg/dL   Calcium 9.3  8.4 - 95.610.5 mg/dL   GFR calc non Af Amer >90  >90 mL/min   GFR calc Af Amer >90  >90 mL/min   Anion gap 12  5 - 15    1200:  TTS eval pending. Will move to [facility].     Roscoe Witts, DO 07/08/14 1541

## 2014-07-08 NOTE — ED Notes (Signed)
[facility] She advises for [facility] to return to [facility] in the morning at 8 am. Pt is agreable. Pt warned not to use before returning to [facility]

## 2014-07-08 NOTE — BH Assessment (Addendum)
Patient is ready for discharge. Writer followed up with [facility] to make staff aware that patient is medically cleared and ready for discharge.  Writer notified management that patient is medically cleared and this information will be noted on her discharge paperwork.   [facility] patient will need to call in the morning to see if her bed is ready. Patient's bed WILL NOT be ready for her today as they are at capacity. Patient bed should be ready tomorrow per management.    Writer notified patient's nurse-Annette of the updated information.

## 2014-07-08 NOTE — BH Assessment (Signed)
Patient appears to be here for [facility]. According to patient [facility] and once medically cleared. No psychiatric crises at this time. Patient is not SI, HI, and/or experiencing at psychotic symptoms. Patient has a hx of substance abuse. Relapsed 4 days ago on Heroin and also used Benzo's/THC during that time.  Writer contacted [facility] to verify that patient does in fact have a bed once medially cleared. Per Diplomatic Services operational [facility]secret[facility] came in and completed her assessment today. Sts that once patient is medically cleared she may follow up with the facility. Writer attempted to to speak with the counselor that assessed patient today with no response (voicmail left).   Writer discussed case with EDP-Dr. Fonnie JarvisBednar and he verified that patient is in fact medically cleared. Writer also discussed with EDP-Dr. Fonnie JarvisBednar the true need for a complete tele assessment. Based on the fact that patient relapsed just 4 days on opiates and according to the [facility] no tele assessment is neccessary. Dr. Fonnie JarvisBednar agrees that this patient may be discharged to [facility]. Dr. Fonnie JarvisBednar will also make a note on patient's discharge that patient is medically cleared and [facility].

## 2014-09-29 ENCOUNTER — Encounter (HOSPITAL_COMMUNITY): Payer: Self-pay | Admitting: Emergency Medicine

## 2015-02-22 ENCOUNTER — Emergency Department (HOSPITAL_COMMUNITY)
Admission: EM | Admit: 2015-02-22 | Discharge: 2015-02-22 | Disposition: A | Discharge status: Home / Self Care | Payer: Medicaid Other | Attending: Emergency Medicine | Admitting: Emergency Medicine

## 2015-02-22 ENCOUNTER — Emergency Department (HOSPITAL_COMMUNITY): Payer: Medicaid Other

## 2015-02-22 ENCOUNTER — Encounter (HOSPITAL_COMMUNITY): Payer: Self-pay | Admitting: Emergency Medicine

## 2015-02-22 DIAGNOSIS — F419 Anxiety disorder, unspecified: Secondary | ICD-10-CM | POA: Insufficient documentation

## 2015-02-22 DIAGNOSIS — S9002XA Contusion of left ankle, initial encounter: Secondary | ICD-10-CM | POA: Insufficient documentation

## 2015-02-22 DIAGNOSIS — Z8619 Personal history of other infectious and parasitic diseases: Secondary | ICD-10-CM | POA: Insufficient documentation

## 2015-02-22 DIAGNOSIS — F319 Bipolar disorder, unspecified: Secondary | ICD-10-CM | POA: Insufficient documentation

## 2015-02-22 DIAGNOSIS — S93402A Sprain of unspecified ligament of left ankle, initial encounter: Secondary | ICD-10-CM | POA: Insufficient documentation

## 2015-02-22 DIAGNOSIS — J45909 Unspecified asthma, uncomplicated: Secondary | ICD-10-CM | POA: Insufficient documentation

## 2015-02-22 DIAGNOSIS — Z79899 Other long term (current) drug therapy: Secondary | ICD-10-CM | POA: Insufficient documentation

## 2015-02-22 DIAGNOSIS — Y998 Other external cause status: Secondary | ICD-10-CM | POA: Insufficient documentation

## 2015-02-22 DIAGNOSIS — W109XXA Fall (on) (from) unspecified stairs and steps, initial encounter: Secondary | ICD-10-CM | POA: Insufficient documentation

## 2015-02-22 DIAGNOSIS — Z8742 Personal history of other diseases of the female genital tract: Secondary | ICD-10-CM | POA: Insufficient documentation

## 2015-02-22 DIAGNOSIS — Y929 Unspecified place or not applicable: Secondary | ICD-10-CM | POA: Insufficient documentation

## 2015-02-22 DIAGNOSIS — Y9389 Activity, other specified: Secondary | ICD-10-CM | POA: Insufficient documentation

## 2015-02-22 DIAGNOSIS — Z720 Tobacco use: Secondary | ICD-10-CM | POA: Insufficient documentation

## 2015-02-22 MED ORDER — TRAMADOL HCL 50 MG PO TABS: 50.0000 mg | ORAL_TABLET | Freq: Four times a day (QID) | ORAL | Status: DC | PRN

## 2015-02-22 MED ORDER — IBUPROFEN 600 MG PO TABS: 600.0000 mg | ORAL_TABLET | Freq: Four times a day (QID) | ORAL | Status: DC | PRN

## 2015-02-22 NOTE — ED Provider Notes (Signed)
CSN: [id number]     Arrival date & time 02/22/15  1959 History  This chart was scribed for non-physician practitioner, Jaynie Crumble, PA-C working with Samuel Jester, DO by Greggory Stallion, ED scribe. This patient was seen in room TR09C/TR09C and the patient's care was started at 8:49 PM.   Chief Complaint  Patient presents with  . Ankle Pain   The history is provided by the patient. No language interpreter was used.    HPI Comments: Meghan Bullock is a 24 y.o. female who presents to the Emergency Department complaining of left ankle injury that occurred around 4 PM today. Pt tripped and fell down 2 steps, twisting her ankle. She reports sudden onset pain with associated swelling. Bearing weight worsens pain. Nothing makes it better. No prior ankle injuries. No other injuries. Pt has taken ibuprofen and aleve with no relief.   Past Medical History  Diagnosis Date  . Mononucleosis   . Ovarian cyst   . Chlamydia   . Bipolar 1 disorder   . Anxiety   . Heroin abuse   . Asthma   . Polysubstance abuse    Past Surgical History  Procedure Laterality Date  . No past surgeries     No family history on file. History  Substance Use Topics  . Smoking status: Current Every Day Smoker -- 1.00 packs/day for 5 years    Types: Cigarettes  . Smokeless tobacco: Not on file  . Alcohol Use: No   OB History    Gravida Para Term Preterm AB TAB SAB Ectopic Multiple Living   Review of Systems  Musculoskeletal: Positive for joint swelling and arthralgias.  All other systems reviewed and are negative.  Allergies  Hydrocodone; Advil; and Tramadol  Home Medications   Prior to Admission medications   Medication Sig Start Date End Date Taking? Authorizing Provider  sertraline (ZOLOFT) 50 MG tablet Take 50 mg by mouth daily.    Historical Provider, MD   BP 132/79 mmHg  Pulse 117  Temp(Src) 98.3 F (36.8 C) (Oral)  Resp 15  Ht  (1.626 m)  Wt 160 lb (72.576  kg)  BMI 27.45 kg/m2  SpO2 98%  LMP 02/15/2015   Physical Exam  Constitutional: She is oriented to person, place, and time. She appears well-developed and well-nourished. No distress.  HENT:  Head: Normocephalic and atraumatic.  Eyes: Conjunctivae and EOM are normal.  Neck: Neck supple. No tracheal deviation present.  Cardiovascular: Normal rate.   Pulmonary/Chest: Effort normal. No respiratory distress.  Musculoskeletal: Normal range of motion.  Swelling noted to the lateral malleolus of the left ankle with mild bruising. TTP.  No tenderness over medial malleolus. Achilles tendon is intact. Foot is normal. Ankle is normal. Patient unable to actively move her ankle, passive range of motion is normal. DP pulses intact.  Neurological: She is alert and oriented to person, place, and time.  Skin: Skin is warm and dry.  Psychiatric: She has a normal mood and affect. Her behavior is normal.  Nursing note and vitals reviewed.   ED Course  Procedures (including critical care time)  DIAGNOSTIC STUDIES: Oxygen Saturation is 98% on RA, normal by my interpretation.    COORDINATION OF CARE: 8:52 PM-Advised pt of xray results. Discussed treatment plan which includes ASO brace, crutches, RICE method, and an anti-inflammatory with pt at bedside and pt agreed to plan.   Labs Review Labs Reviewed -  No data to display  Imaging Review Dg Ankle Complete Left  02/22/2015   CLINICAL DATA:  24 year old female with history of trauma after falling down some steps today complaining of pain and overlying the lateral ankle.  EXAM: LEFT ANKLE COMPLETE - 3+ VIEW  COMPARISON:  No priors.  FINDINGS: Three views of the left ankle demonstrate extensive soft tissue swelling overlying the lateral malleolus. No acute displaced fracture, subluxation or dislocation.  IMPRESSION: 1. Soft tissue swelling overlying the lateral malleolus, without evidence of acute bony trauma.   Electronically Signed   By: Trudie Reedaniel  Entrikin  M.D.   On: 02/22/2015 20:45     EKG Interpretation None      MDM   Final diagnoses:  Ankle sprain, left, initial encounter    Patient with left ankle injury after falling down the steps. Ankle with significant swelling to the lateral malleolus. X-rays negative. Full range of motion. Neurovascular intact. Most likely an ankle sprain. Will treat with a is so brace, ice and elevation at home, Motrin and Tylenol for pain, patient asked for stronger pain medication, will give 15 tramadol tablets. Follow-up with orthopedics if not improving. Crutches and ASO given in emergency department.  Filed Vitals:   02/22/15 2003 02/22/15 2128  BP: 132/79 123/80  Pulse: 117 90  Temp: 98.3 F (36.8 C) 98.4 F (36.9 C)  TempSrc: Oral Oral  Resp: 15 20  Height: 5\' 4"  (1.626 m)   Weight: 160 lb (72.576 kg)   SpO2: 98% 100%    I personally performed the services described in this documentation, which was scribed in my presence. The recorded information has been reviewed and is accurate.   Jaynie Crumbleatyana Demetria Lightsey, PA-C 02/22/15 2210  Samuel JesterKathleen McManus, DO 02/23/15 0122

## 2015-02-22 NOTE — ED Notes (Signed)
Pt states she tripped and fell down steps approx 4 hours ago.  C/o pain and swelling to L ankle.  CMS intact.

## 2015-02-22 NOTE — Discharge Instructions (Signed)
ASO brace for support. Crutches for walking as needed. Keep ankle elevated. Ice several times a day. Ibuprofen and tramadol for pain. Follow-up with Dr. Melynda Keller orthopedics specialist.    Ankle Sprain An ankle sprain is an injury to the strong, fibrous tissues (ligaments) that hold the bones of your ankle joint together.  CAUSES An ankle sprain is usually caused by a fall or by twisting your ankle. Ankle sprains most commonly occur when you step on the outer edge of your foot, and your ankle turns inward. People who participate in sports are more prone to these types of injuries.  SYMPTOMS   Pain in your ankle. The pain may be present at rest or only when you are trying to stand or walk.  Swelling.  Bruising. Bruising may develop immediately or within 1 to 2 days after your injury.  Difficulty standing or walking, particularly when turning corners or changing directions. DIAGNOSIS  Your caregiver will ask you details about your injury and perform a physical exam of your ankle to determine if you have an ankle sprain. During the physical exam, your caregiver will press on and apply pressure to specific areas of your foot and ankle. Your caregiver will try to move your ankle in certain ways. An X-ray exam may be done to be sure a bone was not broken or a ligament did not separate from one of the bones in your ankle (avulsion fracture).  TREATMENT  Certain types of braces can help stabilize your ankle. Your caregiver can make a recommendation for this. Your caregiver may recommend the use of medicine for pain. If your sprain is severe, your caregiver may refer you to a surgeon who helps to restore function to parts of your skeletal system (orthopedist) or a physical therapist. HOME CARE INSTRUCTIONS   Apply ice to your injury for 1-2 days or as directed by your caregiver. Applying ice helps to reduce inflammation and pain.  Put ice in a plastic bag.  Place a towel between your skin and the  bag.  Leave the ice on for 15-20 minutes at a time, every 2 hours while you are awake.  Only take over-the-counter or prescription medicines for pain, discomfort, or fever as directed by your caregiver.  Elevate your injured ankle above the level of your heart as much as possible for 2-3 days.  If your caregiver recommends crutches, use them as instructed. Gradually put weight on the affected ankle. Continue to use crutches or a cane until you can walk without feeling pain in your ankle.  If you have a plaster splint, wear the splint as directed by your caregiver. Do not rest it on anything harder than a pillow for the first 24 hours. Do not put weight on it. Do not get it wet. You may take it off to take a shower or bath.  You may have been given an elastic bandage to wear around your ankle to provide support. If the elastic bandage is too tight (you have numbness or tingling in your foot or your foot becomes cold and blue), adjust the bandage to make it comfortable.  If you have an air splint, you may blow more air into it or let air out to make it more comfortable. You may take your splint off at night and before taking a shower or bath. Wiggle your toes in the splint several times per day to decrease swelling. SEEK MEDICAL CARE IF:   You have rapidly increasing bruising or swelling.  Your  toes feel extremely cold or you lose feeling in your foot.  Your pain is not relieved with medicine. SEEK IMMEDIATE MEDICAL CARE IF:  Your toes are numb or blue.  You have severe pain that is increasing. MAKE SURE YOU:   Understand these instructions.  Will watch your condition.  Will get help right away if you are not doing well or get worse. Document Released: 11/14/2005 Document Revised: 08/08/2012 Document Reviewed: 11/26/2011 [facility] Patient Information 2015 [employer] [place], [place]. This information is not intended to replace advice given to you by your health care provider. Make sure you  discuss any questions you have with your health care provider.

## 2015-02-22 NOTE — ED Notes (Signed)
Patient transported to X-ray 

## 2015-03-20 NOTE — H&P (Signed)
PATIENT NAME:  Meghan Bullock, DIANA MR#:  [id number] DATE OF BIRTH:  Oct 04, 1991  DATE OF ADMISSION:  11/19/2013  PRIMARY CARE PHYSICIAN: None.   CHIEF COMPLAINT: Right foot redness, swelling.   HISTORY OF PRESENT ILLNESS: A 24 year old Caucasian female patient with history of tobacco abuse, presently 3 months' pregnant with no OB care presents to the Emergency Room with complaints of worsening right foot redness, swelling and pain.   The patient was seen here in the Emergency Room on 11/15/2013 after a dog bite, was started on Augmentin and was sent home, but with worsening, the patient has returned. She is afebrile but tachycardic at 115. White count is 8.6. She is able to move her ankle and able to walk with pain. Has not had any chills. No discharge from the wound. No recent antibiotic use other than the Augmentin. No history of MRSA or IV drug use.   The patient continues to smoke in spite of being pregnant, presently is requesting that she be let out to smoke. She has not seen an obstetrician since getting pregnant. Has not had any blood work, not on prenatal vitamins. This is her first pregnancy.   PAST MEDICAL HISTORY: Tobacco abuse.   FAMILY HISTORY: She is not aware of any heart disease, strokes, diabetes or hypertension in the family.   SOCIAL HISTORY: The patient smokes a pack a day. No alcohol. No illicit drugs.   ALLERGIES: HYDROCODONE WHICH CAUSES ITCHING.   HOME MEDICATIONS: Percocet 5/325, one tablet every 6 hours as needed, and Augmentin 875, one tablet b.i.d., started on 11/16/2013 in the Emergency Room here.   REVIEW OF SYSTEMS: CONSTITUTIONAL: No fever, fatigue, weakness.  EYES: No blurred vision, pain or redness.  ENT: No tinnitus, ear pain, hearing loss.  CARDIOVASCULAR: No chest pain, orthopnea, edema.  RESPIRATORY: No shortness of breath, wheezing, cough.  GASTROINTESTINAL: No nausea, vomiting, diarrhea, abdominal pain.  GENITOURINARY: No dysuria, hematuria,  frequency.  ENDOCRINE: No polyuria, nocturia, thyroid problems.  HEMOLYMPHATIC: No anemia, easy bruising, bleeding.  INTEGUMENTARY: Redness and dog bite on the right foot.  NEUROLOGIC: No focal numbness, weakness, seizures.  PSYCHIATRIC: No anxiety or depression.   PHYSICAL EXAMINATION:  VITAL SIGNS: Temperature 97.7, pulse of 82, blood pressure 125/70, saturating 99% on room air.  GENERAL: Moderately built Caucasian female patient lying in bed, seems comfortable, conversational, cooperative with exam.  PSYCHIATRIC: Alert and oriented x3. Mood and affect appropriate. Judgment intact.  HEENT: Atraumatic, normocephalic. Oral mucosa dry and pink. External ears and nose normal. No pallor. No icterus.  Pupils bilaterally equal and reactive to light.  NECK: Supple. No thyromegaly. No palpable lymph nodes. Trachea midline. No carotid bruit. No JVD.  CARDIOVASCULAR: S1, S2, without any murmurs. Peripheral pulses 2+.  RESPIRATORY: Normal work of breathing. Clear to auscultation on both sides. GASTROINTESTINAL: Soft abdomen, nontender. Bowel sounds present. No hepatosplenomegaly palpable.  SKIN: Warm and dry. No petechiae, rash, ulcers.  MUSCULOSKELETAL: No joint swelling or redness noticed. Normal muscle tone.  NEUROLOGICAL: Motor strength 5/5 in upper and lower extremities. Sensation to fine touch intact all over.  EXTREMITIES: Has right dorsum of the foot, erythema, 2 puncture sites from the dog bite but no discharge, no fluctuance. Not involving the ankle.   LABORATORY STUDIES: Glucose of 133, BUN 5, creatinine 0.45, sodium 136, potassium 3.2, AST, ALT, alkaline phosphatase normal. WBC 8.6, hemoglobin 11.5, platelets of 237.   X-ray done on 11/15/2013 showed soft-tissue swelling without any acute bony abnormality of the right foot.   ASSESSMENT  AND PLAN:  1.  Right dorsum of the foot cellulitis after a dog bite. No fluctuance or discharge noticed. We will get an ultrasound to rule out any fluid  collection which might need incision and drainage. We will start the patient on Unasyn and Percocet for pain along with Tylenol for any fever.  2.  Pregnancy, discussed with Dr. Jean RosenthalJackson of obstetrics. He will see the patient. Needs to establish care and also get labs and titers along with ultrasound of the pregnancy.  3.  Tobacco abuse. I have requested the patient do not smoke and advised against it now that she is pregnant, but the patient is insisting that she will go out and smoke. Offered NicoDerm patch but has refused it. Total counseling time spent was greater than 3 minutes.  4.  Hypokalemia. Replace orally.   CODE STATUS: FULL CODE.   TIME SPENT TODAY ON THIS PATIENT: Was 35 minutes.   ____________________________ Molinda BailiffSrikar R. Lacheryl Niesen, MD srs:np D: 11/19/2013 18:13:04 ET T: 11/19/2013 19:05:33 ET JOB#: [id number]  cc: Wardell HeathSrikar R. Lucian Baswell, MD, <Dictator> Orie FishermanSRIKAR R Elza Sortor MD ELECTRONICALLY SIGNED 11/20/2013 14:23

## 2015-03-20 NOTE — Consult Note (Signed)
Brief Consult Note: Diagnosis: cellulitis.   Patient was seen by consultant.   Consult note dictated.   Recommend further assessment or treatment.   Orders entered.   Discussed with Attending MD.   Comments: Unasyn ok for antibiotic.   Type and screen OB ultrasound for dating/viability of her pregnancy discussed nicotine patch while inpatient.  Patient will consider.  Electronic Signatures: Conard NovakJackson, Noach Calvillo D (MD)  (Signed 23-Dec-14 18:35)  Authored: Brief Consult Note   Last Updated: 23-Dec-14 18:35 by Conard NovakJackson, Rhema Boyett D (MD)

## 2015-03-20 NOTE — Discharge Summary (Signed)
PATIENT NAME:  Meghan Bullock, Meghan Bullock MR#:  [id number] DATE OF BIRTH:  20-Jun-1991  DATE OF ADMISSION:  11/19/2013  DATE OF DISCHARGE:  11/20/2013  PRIMARY CARE PHYSICIAN:  Non-local.  CONSULTING PHYSICIAN: Dr. Tiburcio PeaHarris, OB-GYN  DISCHARGE DIAGNOSES:  1.  Right foot cellulitis.  2.  Pregnancy.  3.  Tobacco abuse.   CONDITION: Stable.   CODE STATUS: FULL CODE.   HOME MEDICATIONS:  1.  Augmentin 875 mg p.o. b.i.d.  2.  Prenatal multivitamin with folic acid 10 mg p.o. daily.   INSTRUCTIONS:  Diet:  Regular diet. Activity as tolerated. Followup care: Follow up with PCP within 1 to 2 weeks. Follow up with Dr. Tiburcio PeaHarris, OB/GYN physician, within 1 to 2 weeks. The patient was counseled for smoking cessation. Avoid narcotics and alcohol.  REASON FOR ADMISSION: Right foot redness and swelling.   HOSPITAL COURSE: The patient is a 24 year old Caucasian female with a history of tobacco abuse, presently 3 months pregnant, with no OB care. Presented in the ED with worsening right foot redness, swelling and pain. The patient  was started on Augmentin 4 days ago, but the patient was febrile and tachycardic at 115. White count increased 6 days ago, but symptoms have been worsening. In addition, the patient has tachycardia at 115. For detailed history and physical examination, please refer to the admission note dictated by Dr. Elpidio AnisSudini.   Laboratory data on admission showed normal BUN, creatinine. Potassium was low at 3.2. WBC 8.6. X-ray of right foot showed soft tissue swelling without any acute bony abnormality. At admission, the patient has been treated with Unasyn IVPB. Ultrasound showed some continuous edema consistent with cellulitis. No abscess. In addition, the patient also had ultrasound of abdomen and pelvis, which showed single viable intrauterine pregnancy, 11 weeks and five days.   After above-mentioned antibiotic treatment, patient's right foot swelling and erythema has much improved. Only has mild  tenderness. She requests Percocet for foot pain.  Tobacco abuse: The patient was counseled for smoking cessation.   Hypokalemia. The patient was treated with potassium.   The patient's vital signs are stable. Physical examination is unremarkable.   The patient is clinically stable. Will be discharged to home today. I discussed the patient's discharge plan with the patient and the family member, discussed with nurse.   TIME SPENT: About 35 minutes.    ____________________________ Shaune PollackQing Carden Teel, MD qc:mr D: 11/20/2013 16:45:00 ET T: 11/20/2013 19:17:32 ET JOB#: [id number]  cc: Shaune PollackQing Alphonza Tramell, MD, <Dictator> Shaune PollackQING Sahith Nurse MD ELECTRONICALLY SIGNED 11/21/2013 17:36

## 2015-03-21 NOTE — Consult Note (Signed)
PATIENT NAME:  Meghan Bullock, Raychelle MR#:  [id number] DATE OF BIRTH:  [birth year]  DATE OF CONSULTATION:  11/19/2013  REFERRING PHYSICIAN:  Srikar R. Sudini, MD CONSULTING PHYSICIAN:  Conard NovakStephen D. Kamara Allan, MD   CHIEF COMPLAINT:  Roughly 3 months' gestation with cellulitis of the right foot secondary to dog bite, requesting assistance with medical treatment.   HISTORY OF PRESENT ILLNESS: The patient is a 24 year old gravida 3, para 0-0-2-0 who is approximately 473 months gestational age by very uncertain last menstrual period. Her pregnancy so far as had been complicated by lack of prenatal care and a history of incarceration for which she has to wear an anklet on her left ankle for monitoring and she is on probation for this. She notes no issues with her pregnancy so far apart from having an animal bite by a pit pull approximately 5 or 6 days ago. She notes that she has had no vaginal bleeding and had only mild cramping. 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 She notes that the bite on her foot has become worse and more painful and that is why she presented to the Emergency Room today. She notes that she has generally regular periods. Again her last one was about 3 months ago. She denies currently fevers and chills.   PAST MEDICAL HISTORY: Denies.   PAST SURGICAL HISTORY: Denies.   CURRENT MEDICATIONS: None.   ALLERGIES: None.   OB/GYN HISTORY:  Obstetrical history is as above. GYN history is negative for STDs. She does state that she has had a Pap smear which was negative.    FAMILY HISTORY: Noncontributory.   SOCIAL HISTORY: Notable for tobacco abuse. She smokes approximately 1 pack per day of cigarettes and has done so for the past 3 to 4 years. She denies alcohol and illicit substance abuse. The patient is a little scant on the details of the fact that she has been incarcerated and is  currently on probation. However, she does have an anklet on her left ankle for locational tracking.   REVIEW OF SYSTEMS: Negative x 10 systems reviewed unless otherwise noted in the HPI.   PHYSICAL EXAMINATION:  VITAL SIGNS:  Temperature 98.8 degrees Fahrenheit (37.1 degrees Celsius), pulse 90, respiratory rate 20, blood pressure 117/70, O2 saturations 98% on room air.  GENERAL: No apparent distress.  HEENT: Normocephalic and atraumatic.  NECK: Trachea is midline.  PULMONARY: Clear to auscultation bilaterally.  CARDIOVASCULAR: Regular rate and rhythm.  ABDOMEN: Soft, nontender, nondistended. No masses appreciated. Positive bowel sounds appreciated.  EXTREMITIES: Notable for right foot with diffuse cellulitis on the dorsal aspect of her foot with several punctures. Left foot is negative for the same. There is no edema, no erythema,  cords or tenderness. She does have an anklet on her left ankle that is a band around her ankle.  PELVIC: Deferred.  NEUROLOGIC EXAM: Grossly intact.   LABORATORY VALUES: CBC is notable for a white blood cell count of 8.6, hemoglobin 11.5, hematocrit 34.6, platelets 237. Basic metabolic panel notable for glucose of 133, sodium 136, potassium 3.2, BUN 5, creatinine 0.45.   ASSESSMENT AND RECOMMENDATIONS: 1.  Pregnancy-related care.  A.  The attending physician had stated that Unasyn will be used for antibiotics which is noted to be safe in pregnancy and this is okay.  B.  Recommend quantitative hCG and type and screen.  C. Recommended OB ultrasound for  dating and viability of the current pregnancy.  D.  Discussed nicotine patch with the patient while she is an inpatient so that she is not able to leave the unit to go smoking, Discussed reasons and implications of liability of the hospital and risk to the patient. She states that she will consider.  2.  Hypokalemia. Will defer electrolyte repletion to primary team.   DISPOSITION:  Upon discharge, recommended to the  patient and to primary team that the patient seek initiation of prenatal care as soon as she is able to so.  The patient may benefit from a social work consult to establish Medicaid if she has no private insurance in order to help with pregnancy care-related issues. This is all pending a viable pregnancy on ultrasound.   Thank you for the opportunity to participate in the care of your patient. We appreciate this consult. Please let us know if we can be of further assistance.    ____________________________ Conard Novak, MD sdj:cs D: 11/19/2013 18:47:00 ET T: 11/19/2013 19:10:52 ET JOB#: [id number]  cc: Conard Novak, MD, <Dictator> Conard Novak MD ELECTRONICALLY SIGNED 12/13/2013 23:25
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
## 2015-04-03 ENCOUNTER — Emergency Department
Admission: EM | Admit: 2015-04-03 | Discharge: 2015-04-04 | Disposition: A | Discharge status: Home / Self Care | Payer: Self-pay | Attending: Emergency Medicine | Admitting: Emergency Medicine

## 2015-04-03 ENCOUNTER — Encounter: Payer: Self-pay | Admitting: *Deleted

## 2015-04-03 DIAGNOSIS — Z720 Tobacco use: Secondary | ICD-10-CM | POA: Insufficient documentation

## 2015-04-03 DIAGNOSIS — F1110 Opioid abuse, uncomplicated: Secondary | ICD-10-CM

## 2015-04-03 DIAGNOSIS — N390 Urinary tract infection, site not specified: Secondary | ICD-10-CM

## 2015-04-03 DIAGNOSIS — Z79899 Other long term (current) drug therapy: Secondary | ICD-10-CM | POA: Insufficient documentation

## 2015-04-03 DIAGNOSIS — Z3202 Encounter for pregnancy test, result negative: Secondary | ICD-10-CM | POA: Insufficient documentation

## 2015-04-03 LAB — URINALYSIS COMPLETE WITH MICROSCOPIC (ARMC ONLY)
Bilirubin Urine: NEGATIVE
GLUCOSE, UA: NEGATIVE mg/dL
HGB URINE DIPSTICK: NEGATIVE
Ketones, ur: NEGATIVE mg/dL
Nitrite: NEGATIVE
Protein, ur: 30 mg/dL — AB
Specific Gravity, Urine: 1.024 (ref 1.005–1.030)
pH: 6 (ref 5.0–8.0)

## 2015-04-03 LAB — CBC
HCT: 39 % (ref 35.0–47.0)
Hemoglobin: 12.3 g/dL (ref 12.0–16.0)
MCH: 24.4 pg — ABNORMAL LOW (ref 26.0–34.0)
MCHC: 31.5 g/dL — ABNORMAL LOW (ref 32.0–36.0)
MCV: 77.6 fL — ABNORMAL LOW (ref 80.0–100.0)
Platelets: 294 10*3/uL (ref 150–440)
RBC: 5.02 MIL/uL (ref 3.80–5.20)
RDW: 15.4 % — ABNORMAL HIGH (ref 11.5–14.5)
WBC: 13 10*3/uL — ABNORMAL HIGH (ref 3.6–11.0)

## 2015-04-03 LAB — COMPREHENSIVE METABOLIC PANEL
ALT: 59 U/L — ABNORMAL HIGH (ref 14–54)
AST: 41 U/L (ref 15–41)
Albumin: 3.9 g/dL (ref 3.5–5.0)
Alkaline Phosphatase: 77 U/L (ref 38–126)
Anion gap: 10 (ref 5–15)
BUN: 7 mg/dL (ref 6–20)
CO2: 25 mmol/L (ref 22–32)
Calcium: 9.2 mg/dL (ref 8.9–10.3)
Chloride: 99 mmol/L — ABNORMAL LOW (ref 101–111)
Creatinine, Ser: 0.78 mg/dL (ref 0.44–1.00)
GFR calc Af Amer: >60 mL/min (ref >60)
GFR calc non Af Amer: >60 mL/min (ref >60)
Glucose, Bld: 140 mg/dL — ABNORMAL HIGH (ref 65–99)
Potassium: 3.7 mmol/L (ref 3.5–5.1)
Sodium: 134 mmol/L — ABNORMAL LOW (ref 135–145)
Total Bilirubin: 0.5 mg/dL (ref 0.3–1.2)
Total Protein: 7.9 g/dL (ref 6.5–8.1)

## 2015-04-03 LAB — POCT PREGNANCY, URINE: Preg Test, Ur: NEGATIVE

## 2015-04-03 NOTE — ED Notes (Addendum)
Patient assigned to appropriate care area. Patient oriented to unit/care area: Informed that, for their safety, care areas are designed for safety and monitored by security cameras at all times; and visiting hours explained to patient. Patient verbalizes understanding, and verbal contract for safety obtained.  Pt has ankle monitor for probation.  Charger and pt belongings kept at nurse's station

## 2015-04-03 NOTE — ED Notes (Signed)

## 2015-04-03 NOTE — ED Notes (Signed)
Pt has hx of heroin addiction. Pt reports being clean x 10 months before starting to use heroin again at the beginning of April. Pt states last use was 04/03/15 @ 0830. Pt states she normally uses a bundle every day throughout the day.

## 2015-04-04 LAB — ACETAMINOPHEN LEVEL: Acetaminophen (Tylenol), Serum: <10 ug/mL — ABNORMAL LOW (ref 10–30)

## 2015-04-04 LAB — SALICYLATE LEVEL

## 2015-04-04 LAB — ETHANOL: Alcohol, Ethyl (B): <5 mg/dL (ref <5)

## 2015-04-04 MED ORDER — FOSFOMYCIN TROMETHAMINE 3 G PO PACK
PACK | ORAL | Status: AC
  Filled 2015-04-04: qty 3

## 2015-04-04 MED ORDER — FOSFOMYCIN TROMETHAMINE 3 G PO PACK
3.0000 g | PACK | Freq: Once | ORAL | Status: AC
  Administered 2015-04-04: 3 g via ORAL

## 2015-04-04 MED ORDER — CLONIDINE HCL 0.1 MG PO TABS
0.1000 mg | ORAL_TABLET | Freq: Once | ORAL | Status: AC
  Administered 2015-04-04: 0.1 mg via ORAL

## 2015-04-04 MED ORDER — ONDANSETRON 4 MG PO TBDP
4.0000 mg | ORAL_TABLET | Freq: Once | ORAL | Status: AC
  Administered 2015-04-04: 4 mg via ORAL

## 2015-04-04 MED ORDER — ONDANSETRON HCL 4 MG PO TABS
4.0000 mg | ORAL_TABLET | Freq: Once | ORAL | Status: AC
  Administered 2015-04-04: 4 mg via ORAL

## 2015-04-04 MED ORDER — ONDANSETRON HCL 4 MG PO TABS
ORAL_TABLET | ORAL | Status: AC
  Filled 2015-04-04: qty 1

## 2015-04-04 MED ORDER — CIPROFLOXACIN HCL 500 MG PO TABS
ORAL_TABLET | ORAL | Status: AC
  Filled 2015-04-04: qty 1

## 2015-04-04 MED ORDER — ONDANSETRON 4 MG PO TBDP
4.0000 mg | ORAL_TABLET | Freq: Once | ORAL | Status: AC
Start: 2015-04-04 — End: 2015-04-04
  Administered 2015-04-04: 4 mg via ORAL

## 2015-04-04 MED ORDER — CIPROFLOXACIN HCL 250 MG PO TABS: 250.0000 mg | ORAL_TABLET | ORAL | Status: DC

## 2015-04-04 MED ORDER — CIPROFLOXACIN HCL 500 MG PO TABS: 250.0000 mg | ORAL_TABLET | Freq: Two times a day (BID) | ORAL | Status: DC

## 2015-04-04 MED ORDER — ONDANSETRON 4 MG PO TBDP
ORAL_TABLET | ORAL | Status: AC
  Filled 2015-04-04: qty 1

## 2015-04-04 MED ORDER — CLONIDINE HCL 0.1 MG PO TABS
ORAL_TABLET | ORAL | Status: AC
  Filled 2015-04-04: qty 1

## 2015-04-04 MED ORDER — CIPROFLOXACIN HCL 500 MG PO TABS
250.0000 mg | ORAL_TABLET | ORAL | Status: AC
  Administered 2015-04-04: 250 mg via ORAL

## 2015-04-04 NOTE — ED Provider Notes (Signed)
[facility] Emergency Department Provider Note    ____________________________________________  Time seen: 11:10 pm  I have reviewed the triage vital signs and the nursing notes.   HISTORY  Chief Complaint Medical Clearance       HPI Meghan Bullock is a 24 y.o. female who comes to the ER requesting assistance with detox from heroin. The patient reports that she relapsed on heroin approximately one month ago. She does report that approximately 10 months ago she had a successful detox. She denies suicidal ideations denies wanting to harm herself or anyone else. She came voluntarily because of concerns that she needs to be available to take care of her son who her mother currently cares for.  Patient states that since last using heroin this morning she is beginning to feel flushed, nauseated, and feeling generally fatigued and ill. She reports that she has had these symptoms previously and they were the same during her prior withdrawal.  She denies any alcohol use or use of other illicit substances. The patient does smoke cigarettes.  Patient denies pain at this time, she reports nausea located in her abdomen. She denies being pregnant. She notes that she is not pregnant.  Of significant pertinent is that she denies suicidal or self harming thoughts, she is not a harm anyone else, she simply wants to get clean from heroin.  (Of note, the patient and I discussed extensively at this time the risks of heroin use and heroin use can be deadly, that there is no guarantee that there are other substances May also within the heroin, that there've been many overdosed deaths, and that she needs to stop using immediately otherwise she risked death or potential severe injury from use of the drug. Patient agrees. She requests detox.)   Past Medical History  Diagnosis Date  . Mononucleosis   . Ovarian cyst   . Chlamydia   . Bipolar 1 disorder   . Anxiety   . Heroin  abuse   . Asthma   . Polysubstance abuse     Patient Active Problem List   Diagnosis Date Noted  . Preterm premature rupture of membranes (PPROM) with unknown onset of labor 05/12/2014  . Encephalopathy, toxic 10/03/2013  . Benzodiazepine abuse 10/03/2013  . Heroin abuse     Past Surgical History  Procedure Laterality Date  . No past surgeries      Current Outpatient Rx  Name  Route  Sig  Dispense  Refill  . ibuprofen (ADVIL,MOTRIN) 600 MG tablet   Oral   Take 1 tablet (600 mg total) by mouth every 6 (six) hours as needed.   30 tablet   0   . sertraline (ZOLOFT) 50 MG tablet   Oral   Take 50 mg by mouth daily.         . traMADol (ULTRAM) 50 MG tablet   Oral   Take 1 tablet (50 mg total) by mouth every 6 (six) hours as needed.   15 tablet   0     Allergies Hydrocodone; Advil; and Tramadol  History reviewed. No pertinent family history.  Social History History  Substance Use Topics  . Smoking status: Current Every Day Smoker -- 1.00 packs/day for 5 years    Types: Cigarettes  . Smokeless tobacco: Not on file  . Alcohol Use: No    Review of Systems  Constitutional: Negative for fever. Eyes: Negative for visual changes. ENT: Negative for sore throat. Cardiovascular: Negative for chest pain. Respiratory: Negative  for shortness of breath. Gastrointestinal: Nausea and feeling of general fatigue. Genitourinary: Negative for dysuria. Musculoskeletal: Negative for back pain. Skin: Negative for rash. Neurological: Negative for headaches, focal weakness or numbness.   10-point ROS otherwise negative.  ____________________________________________   PHYSICAL EXAM:  VITAL SIGNS: ED Triage Vitals  Enc Vitals Group     BP 04/03/15 2111 100/70 mmHg     Pulse Rate 04/03/15 2111 99     Resp 04/03/15 2111 20     Temp 04/03/15 2111 99.1 F (37.3 C)     Temp Source 04/03/15 2111 Oral     SpO2 04/03/15 2111 100 %     Weight 04/03/15 2111 160 lb (72.576 kg)      Height 04/03/15 2111 5\' 4"  (1.626 m)     Head Cir --      Peak Flow --      Pain Score 04/03/15 2115 6     Pain Loc --      Pain Edu? --      Excl. in GC? --      Constitutional: Alert and oriented. Well appearing and in no distress. Eyes: Conjunctivae are normal. PERRL. Normal extraocular movements. ENT   Head: Normocephalic and atraumatic.   Nose: No congestion/rhinnorhea.   Mouth/Throat: Mucous membranes are moist.   Neck: No stridor. Hematological/Lymphatic/Immunilogical: No cervical lymphadenopathy. Cardiovascular: Normal rate, regular rhythm. Normal and symmetric distal pulses are present in all extremities. No murmurs, rubs, or gallops. Respiratory: Normal respiratory effort without tachypnea nor retractions. Breath sounds are clear and equal bilaterally. No wheezes/rales/rhonchi. Gastrointestinal: Soft and nontender. No distention. No abdominal bruits. There is no CVA tenderness. Genitourinary:  Musculoskeletal: Nontender with normal range of motion in all extremities. No joint effusions.  No lower extremity tenderness nor edema. Neurologic:  Normal speech and language. No gross focal neurologic deficits are appreciated. Speech is normal. No gait instability. Skin:  Skin is warm, dry and intact. No rash, but there is evidence of old track marks [facility] bilateral [facility]. Psychiatric: Mood and affect are normal. Speech and behavior are normal. Patient exhibits appropriate insight and judgment.  ____________________________________________    LABS (pertinent positives/negatives)  Labs Reviewed  ACETAMINOPHEN LEVEL - Abnormal; Notable for the following:    Acetaminophen (Tylenol), Serum <10 (*)    All other components within normal limits  CBC - Abnormal; Notable for the following:    WBC 13.0 (*)    MCV 77.6 (*)    MCH 24.4 (*)    MCHC 31.5 (*)    RDW 15.4 (*)    All other components within normal limits  COMPREHENSIVE METABOLIC PANEL - Abnormal; Notable for  the following:    Sodium 134 (*)    Chloride 99 (*)    Glucose, Bld 140 (*)    ALT 59 (*)    All other components within normal limits  URINALYSIS COMPLETEWITH MICROSCOPIC ([facility])  - Abnormal; Notable for the following:    Color, Urine YELLOW (*)    APPearance HAZY (*)    Protein, ur 30 (*)    Leukocytes, UA TRACE (*)    Bacteria, UA RARE (*)    Squamous Epithelial / LPF 6-30 (*)    All other components within normal limits  ETHANOL  SALICYLATE LEVEL  PREGNANCY, URINE  POCT PREGNANCY, URINE     ____________________________________________   EKG    ____________________________________________    RADIOLOGY    ____________________________________________   PROCEDURES  Procedure(s) performed: None  Critical Care performed: No  ____________________________________________   INITIAL IMPRESSION / ASSESSMENT AND PLAN / ED COURSE  Pertinent labs & imaging results that were available during my care of the patient were reviewed by me and considered in my medical decision making (see chart for details).  Detox request. Patient presentation appears to be consistent with acute heroin withdrawal. She presently shows no signs of dehydration, she just has moderate nausea at this time with fatigue and general malaise. Note she does have a possible low-grade temp and slightly elevated white blood cell count, her urinalysis shows bacteria and leukocyte Estrace which is consistent with mild UTI. No other signs of bacterial infection. Patient is awake alert and oriented. She denies homicidal suicidal or self harming ideation.  At this point we will treat her for a UTI and otherwise clear her to detox referral. I have ordered behavioral medicine consultation to assist.  ____________________________________________   FINAL CLINICAL IMPRESSION(S) / ED DIAGNOSES  Final diagnoses:  Heroin abuse  Urinary tract infection, acute   ----------------------------------------- 7:50  AM on 04/04/2015 patient awaiting placement at [facility]. Care assigned to Dr. Juliette Alcide. -----------------------------------------    Sharyn Creamer, MD 04/04/15 [phone]

## 2015-04-04 NOTE — ED Notes (Signed)
ED BHU PLACEMENT JUSTIFICATION Is the patient under IVC or is there intent for IVC: No. Is the patient medically cleared: No. Is there vacancy in the ED BHU: Yes.   Is the population mix appropriate for patient: Yes.   Is the patient awaiting placement in inpatient or outpatient setting: No. Has the patient had a psychiatric consult: No. Survey of unit performed for contraband, proper placement and condition of furniture, tampering with fixtures in bathroom, shower, and each patient room: Yes.  ; Findings:  APPEARANCE/BEHAVIOR calm and cooperative NEURO ASSESSMENT Orientation: time, place and person Hallucinations: No.None noted (Hallucinations) Speech: Normal Gait: normal RESPIRATORY ASSESSMENT Normal expansion.  Clear to auscultation.  No rales, rhonchi, or wheezing. CARDIOVASCULAR ASSESSMENT regular rate and rhythm, S1, S2 normal, no murmur, click, rub or gallop GASTROINTESTINAL ASSESSMENT soft, nontender, BS WNL, no r/g EXTREMITIES normal strength, tone, and muscle mass, no deformities PLAN OF CARE Provide calm/safe environment. Vital signs assessed twice daily. ED BHU Assessment once each 12-hour shift. Collaborate with intake RN daily or as condition indicates. Assure the ED provider has rounded once each shift. Provide and encourage hygiene. Provide redirection as needed. Assess for escalating behavior; address immediately and inform ED provider.  Assess family dynamic and appropriateness for visitation as needed: Yes.  ; If necessary, describe findings:  Educate the patient/family about BHU procedures/visitation: Yes.  ; If necessary, describe findings:

## 2015-04-04 NOTE — ED Notes (Signed)
Pt vomited after taking antibiotic.  Dr. Fanny BienQuale to be informed.

## 2015-04-04 NOTE — ED Notes (Signed)
BEHAVIORAL HEALTH ROUNDING Patient sleeping: Yes.   Patient alert and oriented: not applicable Behavior appropriate: Yes.  ; If no, describe:  Nutrition and fluids offered: No Toileting and hygiene offered: No Sitter present: yes Law enforcement present: Yes   

## 2015-04-04 NOTE — BH Assessment (Signed)
Assessment Note  Meghan Bullock is an 24 y.o. 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 Mononucleosis   . Ovarian cyst   . Chlamydia   . Bipolar 1 disorder   . Anxiety   . Heroin abuse   . Asthma   . Polysubstance abuse    Axis IV: economic problems, other psychosocial or environmental problems, problems related to legal system/crime and problems with access to health care services Axis V: 41-50 serious symptoms  Past Medical History:  Past Medical History  Diagnosis Date  . Mononucleosis   . Ovarian cyst   . Chlamydia   . Bipolar 1 disorder   . Anxiety   . Heroin abuse   . Asthma   . Polysubstance abuse     Past Surgical History  Procedure Laterality Date  . No past surgeries      Family History: History reviewed. No pertinent family history.  Social History:  reports that she has been smoking Cigarettes.  She has a 5 pack-year smoking history. She does not have any smokeless tobacco history on file. She reports that she uses illicit drugs (IV and Heroin). She reports that she does not drink alcohol.  Additional Social History:     CIWA: CIWA-Ar BP: 100/70 mmHg Pulse Rate: 99 COWS:    Allergies:  Allergies  Allergen Reactions  . Hydrocodone Itching  . Advil [Ibuprofen] Rash    C/O itching with 800mg  IBU, but takes OTC Advil without any problems.  . Tramadol Rash    Home Medications:  (Not in a hospital admission)  OB/GYN Status:  Patient's last menstrual period was 03/18/2015 (approximate).  General Assessment Data [facility] of Assessment[facility] ED TTS Assessment: In system Is this a  Tele or Face-to-Face Assessment?: Face-to-Face Is this an Initial Assessment or a Re-assessment for this encounter?: Initial Assessment Marital status: Single Maiden name: none Is patient pregnant?: No Pregnancy Status: No Living Arrangements: Other relatives Can pt return to current living arrangement?: Yes Admission Status: Voluntary Is patient capable of signing voluntary admission?: Yes Referral Source: Self/Family/Friend Insurance type: none  Medical Screening Exam [facility] Walk-[facility] ONLY) Medical Exam completed: Yes  Crisis Care Plan Living Arrangements: Other relatives Name of Psychiatrist: none Name of Therapist: none  Education Status Is patient currently in school?: No Current Grade: n/a Highest grade of school patient has completed: college Name of school: n/a Contact person: grandmother  Risk to self with the past 6 months Suicidal Ideation: No Has patient been a risk to self within the past 6 months prior to admission? : No Suicidal Intent: No Has patient had any suicidal intent within the past 6 months prior to admission? : No Is patient at risk for suicide?: No Suicidal Plan?: No Has patient had any suicidal plan within the past 6 months prior to admission? : No Access to Means: No What has been your use of drugs/alcohol within the last 12 months?: heroin Previous Attempts/Gestures: No How many times?: 0 Other Self Harm Risks: heroin use Triggers for Past Attempts:  (none voiced) Intentional Self Injurious Behavior: None  Family Suicide History: No Recent stressful life event(s):  (heroin use; legal; on probation) Substance abuse history and/or treatment for substance abuse?: Yes Suicide prevention information given to non-admitted patients: Yes  Risk to Others within the past 6 months Homicidal Ideation: No Does patient have any lifetime risk of violence toward others beyond the six months prior to admission? : No Thoughts of Harm to Others: No Current  Homicidal Intent: No Current Homicidal Plan: No Access to Homicidal Means: No Identified Victim: none History of harm to others?: No Assessment of Violence: On admission Violent Behavior Description: none Does patient have access to weapons?: No Criminal Charges Pending?: Yes Describe Pending Criminal Charges:  (probation violation; larceny charges) Does patient have a court date:  (May 12, 2015 for probation violation) Is patient on probation?: Yes (on probation for 2 years)  Psychosis Hallucinations: None noted Delusions: None noted  Mental Status Report Appearance/Hygiene: In scrubs Motor Activity: Unremarkable Speech: Slow Level of Consciousness: Alert, Sleeping Mood: Fearful, Helpless Affect: Fearful Thought Processes: Coherent Judgement: Impaired Orientation: Person, Place, Time, Situation Obsessive Compulsive Thoughts/Behaviors: None  Cognitive Functioning Concentration: Good Memory: Recent Intact, Remote Intact IQ: Average Insight: Fair Impulse Control: Good Appetite: Fair Weight Loss:  (0) Weight Gain:  (0) Sleep: No Change Total Hours of Sleep: 6 Vegetative Symptoms: None  ADLScreening [facility] Assessment Services) Patient's cognitive ability adequate to safely complete daily activities?: Yes Patient able to express need for assistance with ADLs?: Yes Independently performs ADLs?: Yes (appropriate for developmental age)  Prior Inpatient Therapy Prior Inpatient Therapy: No Prior Therapy Dates: none (none) Prior Therapy Facilty/Provider(s): none Reason for Treatment: none  Prior Outpatient Therapy Prior Outpatient Therapy: No Prior Therapy Dates: none Prior Therapy Facilty/Provider(s): none Reason for Treatment: none Does patient have an ACCT team?: No Does patient have Intensive In-House Services?  : No Does patient have [facility] services? : No Does patient have P4CC services?: No  ADL Screening (condition at time of admission) Patient's cognitive  ability adequate to safely complete daily activities?: Yes Patient able to express need for assistance with ADLs?: Yes Independently performs ADLs?: Yes (appropriate for developmental age)       Abuse/Neglect Assessment (Assessment to be complete while patient is alone) Physical Abuse: Denies Verbal Abuse: Denies Sexual Abuse: Denies Exploitation of patient/patient's resources: Denies Self-Neglect: Denies Values / Beliefs Cultural Requests During Hospitalization: None Spiritual Requests During Hospitalization: None Consults Spiritual Care Consult Needed: No Social Work Consult Needed: No Merchant navy officerAdvance Directives (For Healthcare) Does patient have an advance directive?: No    Additional Information 1:1 In Past 12 Months?: No CIRT Risk: No Elopement Risk: No  Child/Adolescent Assessment Running Away Risk: Denies Bed-Wetting: Denies Destruction of Property: Denies Cruelty to Animals: Denies Stealing: Denies Rebellious/Defies Authority:  (denies) Satanic Involvement: Denies Archivistire Setting:  (denies) Problems at [employer]:  (denies) Gang Involvement: Denies  Disposition:  Disposition Initial Assessment Completed for this Encounter: Yes Disposition of Patient: Inpatient treatment program  On Site Evaluation by:   Reviewed with Physician:    Dwan BoltMargaret Okey Zelek 04/04/2015 3:26 AM

## 2015-04-04 NOTE — Discharge Instructions (Signed)
Take cipro as instructed for UTI. Go to [facility] as needed.

## 2015-04-04 NOTE — Plan of Care (Signed)
Client information has been faxed to [facility]; spoke with Andrey CampanileSandy, RN @ [facility] @ 04:18.

## 2015-04-04 NOTE — [facility] Counselor (Signed)
Received phone call from [facility] ([phone]), they are requesting to have the pt. Ready and waiting in the lobby for pick up @ 8:45. Information forwarded pt. RN(Denia).

## 2015-04-25 ENCOUNTER — Encounter (HOSPITAL_COMMUNITY): Payer: Self-pay | Admitting: *Deleted

## 2015-04-25 ENCOUNTER — Emergency Department (HOSPITAL_COMMUNITY)
Admission: EM | Admit: 2015-04-25 | Discharge: 2015-04-25 | Discharge status: Against Medical Advice | Payer: Self-pay | Attending: Emergency Medicine | Admitting: Emergency Medicine

## 2015-04-25 ENCOUNTER — Emergency Department (HOSPITAL_COMMUNITY)
Admission: EM | Admit: 2015-04-25 | Discharge: 2015-04-25 | Disposition: A | Discharge status: Home / Self Care | Payer: Self-pay | Attending: Emergency Medicine | Admitting: Emergency Medicine

## 2015-04-25 DIAGNOSIS — Z8619 Personal history of other infectious and parasitic diseases: Secondary | ICD-10-CM | POA: Insufficient documentation

## 2015-04-25 DIAGNOSIS — Z720 Tobacco use: Secondary | ICD-10-CM | POA: Insufficient documentation

## 2015-04-25 DIAGNOSIS — F419 Anxiety disorder, unspecified: Secondary | ICD-10-CM | POA: Insufficient documentation

## 2015-04-25 DIAGNOSIS — L02413 Cutaneous abscess of right upper limb: Secondary | ICD-10-CM | POA: Insufficient documentation

## 2015-04-25 DIAGNOSIS — J45909 Unspecified asthma, uncomplicated: Secondary | ICD-10-CM | POA: Insufficient documentation

## 2015-04-25 DIAGNOSIS — F319 Bipolar disorder, unspecified: Secondary | ICD-10-CM | POA: Insufficient documentation

## 2015-04-25 DIAGNOSIS — L0291 Cutaneous abscess, unspecified: Secondary | ICD-10-CM

## 2015-04-25 DIAGNOSIS — Z792 Long term (current) use of antibiotics: Secondary | ICD-10-CM | POA: Insufficient documentation

## 2015-04-25 DIAGNOSIS — Z8742 Personal history of other diseases of the female genital tract: Secondary | ICD-10-CM | POA: Insufficient documentation

## 2015-04-25 LAB — POC URINE PREG, ED: PREG TEST UR: NEGATIVE

## 2015-04-25 MED ORDER — LIDOCAINE HCL (PF) 1 % IJ SOLN
5.0000 mL | Freq: Once | INTRAMUSCULAR | Status: DC
  Filled 2015-04-25: qty 5

## 2015-04-25 MED ORDER — LIDOCAINE-EPINEPHRINE 2 %-1:200000 IJ SOLN
10.0000 mL | Freq: Once | INTRAMUSCULAR | Status: AC
Start: 2015-04-25 — End: 2015-04-25
  Administered 2015-04-25: 10 mL via INTRADERMAL
  Filled 2015-04-25: qty 20

## 2015-04-25 MED ORDER — CEPHALEXIN 500 MG PO CAPS: 500.0000 mg | ORAL_CAPSULE | Freq: Four times a day (QID) | ORAL | Status: DC

## 2015-04-25 NOTE — ED Notes (Addendum)
Pt with abscess to R arm x 1 week.  IV drug user.

## 2015-04-25 NOTE — Discharge Instructions (Signed)
Abscess Take antibiotics as prescribed.  Have packing removed and replaced tomorrow at urgent care. Return for fever or no improvement in 24 hours. Take tylenol or motrin as needed for pain.  An abscess is an infected area that contains a collection of pus and debris.It can occur in almost any part of the body. An abscess is also known as a furuncle or boil. CAUSES  An abscess occurs when tissue gets infected. This can occur from blockage of oil or sweat glands, infection of hair follicles, or a minor injury to the skin. As the body tries to fight the infection, pus collects in the area and creates pressure under the skin. This pressure causes pain. People with weakened immune systems have difficulty fighting infections and get certain abscesses more often.  SYMPTOMS Usually an abscess develops on the skin and becomes a painful mass that is red, warm, and tender. If the abscess forms under the skin, you may feel a moveable soft area under the skin. Some abscesses break open (rupture) on their own, but most will continue to get worse without care. The infection can spread deeper into the body and eventually into the bloodstream, causing you to feel ill.  DIAGNOSIS  Your caregiver will take your medical history and perform a physical exam. A sample of fluid may also be taken from the abscess to determine what is causing your infection. TREATMENT  Your caregiver may prescribe antibiotic medicines to fight the infection. However, taking antibiotics alone usually does not cure an abscess. Your caregiver may need to make a small cut (incision) in the abscess to drain the pus. In some cases, gauze is packed into the abscess to reduce pain and to continue draining the area. HOME CARE INSTRUCTIONS   Only take over-the-counter or prescription medicines for pain, discomfort, or fever as directed by your caregiver.  If you were prescribed antibiotics, take them as directed. Finish them even if you start to feel  better.  If gauze is used, follow your caregiver's directions for changing the gauze.  To avoid spreading the infection:  Keep your draining abscess covered with a bandage.  Wash your hands well.  Do not share personal care items, towels, or whirlpools with others.  Avoid skin contact with others.  Keep your skin and clothes clean around the abscess.  Keep all follow-up appointments as directed by your caregiver. SEEK MEDICAL CARE IF:   You have increased pain, swelling, redness, fluid drainage, or bleeding.  You have muscle aches, chills, or a general ill feeling.  You have a fever. MAKE SURE YOU:   Understand these instructions.  Will watch your condition.  Will get help right away if you are not doing well or get worse. Document Released: 08/24/2005 Document Revised: 05/15/2012 Document Reviewed: 01/27/2012 [facility] Patient Information 2015 [place], [place]. This information is not intended to replace advice given to you by your health care provider. Make sure you discuss any questions you have with your health care provider.  Emergency Department Resource Guide 1) Find a Doctor and Pay Out of Pocket Although you won't have to find out who is covered by your insurance plan, it is a good idea to ask around and get recommendations. You will then need to call the office and see if the doctor you have chosen will accept you as a new patient and what types of options they offer for patients who are self-pay. Some doctors offer discounts or will set up payment plans for their patients who  do not have insurance, but you will need to ask so you aren't surprised when you get to your appointment.  2) Contact Your Local Health Department Not all health departments have doctors that can see patients for sick visits, but many do, so it is worth a call to see if yours does. If you don't know where your local health department is, you can check in your phone book. The CDC also has a tool to  help you locate your state's health department, and many state websites also have listings of all of their local health departments.  3) Find a Walk-in Clinic If your illness is not likely to be very severe or complicated, you may want to try a walk in clinic. These are popping up all over the country in pharmacies, drugstores, and shopping centers. They're usually staffed by nurse practitioners or physician assistants that have been trained to treat common illnesses and complaints. They're usually fairly quick and inexpensive. However, if you have serious medical issues or chronic medical problems, these are probably not your best option.  No Primary Care Doctor: - Call Health Connect at  [phone] - they can help you locate a primary care doctor that  accepts your insurance, provides certain services, etc. - Physician Referral Service- [phone]  Chronic Pain Problems: Organization         Address  Phone   Notes  [facility]  [phone] Patients need to be referred by their primary care doctor.   Medication Assistance: Organization         Address  Phone   Notes  [facility] Medication [facility] [address] [place] [place], [place] [address] [phone] --Must be a resident of [facility] -- Must have NO insurance coverage whatsoever (no Medicaid/ Medicare, etc.) -- The pt. MUST have a primary care doctor that directs their care regularly and follows them in the community   Med[facility]ist  [phone]   [employer]  [phone]    Agencies that provide inexpensive medical care: Organization         Address  Phone   Notes  [facility]  [phone]   [facility]    [phone]   [facility] [address] [place], [place] [address] [phone]   [facility] [address] [place] [place] [phone]   Planned Parenthood    [phone]   [facility]    [phone]   [facility]  [address], [place] Phone:  [phone], Fax:  [phone] Hours of Operation:  9 am - 6 pm, M-F.  Also accepts Medicaid/Medicare and self-pay.  [facility]  [address], [place] Phone: [phone], Fax: [phone]. Hours of Operation:  8:30 am - 5:30 pm, M-F.  Also accepts Medicaid and self-pay.  [facility] [place] [address], [place] Phone: [phone]   [facility] [address] Natasha Bence [place], [place] [phone], Ext. [phone] Mondays & Thursdays: 7-9 AM.  First 15 patients are seen on a first come, first serve basis.    Medicaid-accepting [facility] Providers:  Organization         Address  Phone   Notes  [facility] [address], [place] [phone] Also accepts self-pay patients.  [facility]  [address] [place], [place]  [phone](336) [phone]   [facility] [address], [place] [phone]   [facility] [address] [place] [address], [place] [phone]   Renaye RakersVeita Bland [address], [place]   [phone] Only accepts [place] Access [place]icaid patients after they have their name applied to their card.   Self-Pay (no insurance) in [facility]:  Organization         Address  Phone   Notes  Sickle Cell Patients, [facility] [address], [place] [phone]) [phone]   [facility] [address], [place] [phone]   [facility]  [address], [place] [phone]) [phone]   [facility]/Dr. Osei-Bonsu  [address] [place] [address], [place] or [phone], [place] [phone] Phone number for both [place] and [place] locations is the same.  [facility]gent [facility] and [facility] [address],  [place] [phone]) [phone]   [facility] [address] [place] [address], [place] or [address] [phone]   [facility] [address], [place] [phone](336) [phone], phone; [phone]) [phone], fax Sees patients 1st and 3rd Saturday of every month.  Must not qualify for public or private insurance (i.e. Medicaid, Medicare, [place] Health Choice, Veterans' Benefits)  Household income should be no more than 200% of the poverty level The clinic cannot treat you if you are pregnant or think you are pregnant  Sexually transmitted diseases are not treated at the clinic.    Dental Care: Organization         Address  Phone  Notes  [facility] [address], [place] [phone]) [phone] Accepts children up to age 22 who are enrolled in [place]icaid or [place] Health Choice; pregnant women with a Medicaid card; and children who have applied for Medicaid or [place] Health Choice, but were declined, whose parents can pay a reduced fee at time of service.  [facility] [place]  [address], [place] [phone](336) [phone] Accepts children up to age 22 who are enrolled in [place]icaid or [place] Health Choice; pregnant women with a Medicaid card; and children who have applied for Medicaid or [place] Health Choice, but were declined, whose parents can pay a reduced fee at time of service.  [facility]  [address], [place] [phone]) [phone] Patients are seen by appointment only. Walk-ins are not accepted. [facility] will see patients 22 years of age and older. Monday - Tuesday (8am-5pm) Most Wednesdays (8:30-5pm) $30 per visit, cash only  [facility]  [address], [facility] [phone]) [phone] Patients are seen by appointment only. Walk-ins are not accepted. [facility] will see patients 22 years of age and older. One Wednesday Evening  (Monthly: Volunteer Based).  $30 per visit, cash only  [employer] [facility]  [phone] for adults; Children under age 414, call Graduate Pediatric Dentistry at [phone]. Children aged 764-14, please call [phone] to request a pediatric application.  Dental services are provided in all areas of dental care including fillings, crowns and bridges, complete and partial dentures, implants, gum treatment, root canals, and extractions. Preventive care is also provided. Treatment is provided to both adults and children. Patients  are selected via a lottery and there is often a waiting list.   [facility] [address], [place]  [phone] [email or web address]   [facility] [address], [place], [place] [phone], Ext. [phone] Second and Fourth Thursday of each month, opens at 6:30 AM; Clinic ends at 9 AM.  Patients are seen on a first-come first-served basis, and a limited number are seen during each clinic.   [facility]  [address], [place], [place] [phone]) [phone]   Eligibility Requirements You must have lived in [place], [place], or [place] counties for at least the last three months.   You cannot be eligible for state or federal sponsored National Cityhealthcare insurance, including CIGNAVeterans Administration, [place]icaid, or [employer] EntertainmentMedicare.   You generally cannot be eligible for healthcare insurance through your employer.    How to apply: Eligibility screenings are held every Tuesday and Wednesday afternoon from 1:00 pm until 4:00 pm. You do not need an appointment for the interview!  [facility] [address], [place], [place] [phone]   [facility]  [phone]   [facility]  [phone]   [facility]  [phone]    Behavioral Health Resources in the Community: Intensive Outpatient Programs Organization         Address  Phone  Notes  [facility]  [facility] [address], [place], [place] [phone]   [facility] [address], [place], [place] [phone]   [facility] [address], [place], [place]  [phone]   [facility] [address],  [place], [place] [phone] or [phone]   Substance Abuse Resources Organization         Address  Phone  Notes  [facility] Drug Services  [phone]   [facility]  [phone]   [facility]  [phone]   [facility]  [phone]   Residential & Outpatient Substance Abuse Program  [phone]   Psychological Services Organization         Address  Phone  Notes  [facility]  [phone]- [phone]   [facility]  [phone]- [phone]   [facility] [address], [place] [phone] or [phone]    Mobile Crisis Teams Organization         Address  Phone  Notes  [facility], Mobile Crisis Care Unit  [phone]   [facility]  [address] [place], [place] [phone]   Doristine LocksSharon DeEsch [address] [place] [phone]    Self-Help/Support Groups Organization         Address  Phone             Notes  [facility] [employer] - variety of support groups  336- [phone] Call for more information  Narcotics Anonymous (NA), Caring [facility] [address], [place]  2 meetings at this location   Statisticianesidential Treatment Programs Organization         Address  Phone  Notes  [facility] [address],    [place]  [phone]   [facility]  [address], [place] [phone], [place], [place] [phone]   [facility] [address], [place] [phone] Admissions: 8am-3pm M-F  [facility] [address]    [place], [place] [phone]   [facility] [address] Leonard Schwartz  [place], [place] [phone]   [facility] [address]  [place], [place] [phone]   [facility] - Intensive Outpatient [address], [place], [place] [phone]   [facility] ([facility] [address]  [place], [place] [phone] or [phone]   [facility] ([facility]) [address], [place], [place] [phone] Accepts Medicaid  [facility] [address]  [place] [phone] Substance Abuse/Addiction Treatment   [facility] Organization         Address  Phone  Notes  [facility]  [phone]   Angie Fava, PhD [address]vin Knack [place], [place]   [phone] or [phone]   [facility]   [address] [place], [place] [phone]   [facility] [address], [place], [place] [phone] Insurance/Medicaid/sponsorship through [facility] [address]                                    [place], [place] [phone] Therapy/tele-psych/case  [facility] [address], [place] [phone]    Dr. Lolly Mustache  [phone]   [facility]  [employer] [facility] Dept. 1) [address] [place] 2) [address] [place] 3)  [address], [place] [phone]  [phone]   [facility] Child Abuse Hotline [phone] or [phone] (After Hours)

## 2015-04-25 NOTE — ED Provider Notes (Signed)
Patient reported that she could no longer stay, states that she needed to pick up her son at school. This provider reported that abscess needs to be drained, patient reported that she cannot stay. She reported that she will come back later on in the day, when her grandmother is able to watch her son. Patient reported that she will come back. This provider did not participate in any medical decision-making for the patient, patient left without being seen after triage.  Raymon MuttonMarissa Emmah Bratcher, PA-C 04/25/15 [address]  Kamelia Lampkins, PA-C 04/25/15 1126  Purvis [place] Harrison, MD 04/25/15 1137

## 2015-04-25 NOTE — ED Notes (Signed)
Pt refuses to put on Hospital gown for assessment.

## 2015-04-25 NOTE — ED Notes (Signed)
Abscess rt arm with redness and swelling fior 7 days.  She was here earlier for the same but had to leave

## 2015-04-25 NOTE — ED Notes (Signed)
PT reports unable to wait for treatment of abscess on RT posterior forearm. Pt made multiple attempts to call family members to pick up son . Pt reports she was unable to reach family and would return later in day to have treatment.

## 2015-04-25 NOTE — ED Provider Notes (Signed)
CSN: [id number]     Arrival date & time 04/25/15  1546 History   First MD Initiated Contact with Patient 04/25/15 1606     Chief Complaint  Patient presents with  . Abscess     (Consider location/radiation/quality/duration/timing/severity/associated sxs/prior Treatment) Patient is a 24 y.o. female presenting with abscess.  Abscess  Dub AmisSpartan Field is a 24 year old female with a history of heroin abuse who presents for right arm abscess that she noticed 1 week ago and it has since gotten bigger. She states that it has since worsened and feels warm and tender to the touch. She had no prior treatment. Bending her arm makes it worse. Nothing makes it better. She states that she was here this morning but had to leave prior to having it seen to. She denies any fever, chills, nausea, vomiting. She states this has never occurred before on any other part of her body. Past Medical History  Diagnosis Date  . Mononucleosis   . Ovarian cyst   . Chlamydia   . Bipolar 1 disorder   . Anxiety   . Heroin abuse   . Asthma   . Polysubstance abuse    Past Surgical History  Procedure Laterality Date  . No past surgeries     No family history on file. History  Substance Use Topics  . Smoking status: Current Every Day Smoker -- 1.00 packs/day for 5 years    Types: Cigarettes  . Smokeless tobacco: Not on file  . Alcohol Use: No   OB History    Gravida Para Term Preterm AB TAB SAB Ectopic Multiple Living   3 1  1 2  2   1      Review of Systems  Musculoskeletal: Negative for joint swelling.  Skin: Positive for color change.      Allergies  Hydrocodone; Advil; and Tramadol  Home Medications   Prior to Admission medications   Medication Sig Start Date End Date Taking? Authorizing Provider  cephALEXin (KEFLEX) 500 MG capsule Take 1 capsule (500 mg total) by mouth 4 (four) times daily. 04/25/15   Darric Plante Patel-Mills, PA-C  ciprofloxacin (CIPRO) 250 MG tablet Take 1 tablet (250 mg total) by mouth  stat. 04/04/15   Sharyn CreamerMark Quale, MD  ibuprofen (ADVIL,MOTRIN) 600 MG tablet Take 1 tablet (600 mg total) by mouth every 6 (six) hours as needed. 02/22/15   Tatyana Kirichenko, PA-C  sertraline (ZOLOFT) 50 MG tablet Take 50 mg by mouth daily.    Historical Provider, MD  traMADol (ULTRAM) 50 MG tablet Take 1 tablet (50 mg total) by mouth every 6 (six) hours as needed. 02/22/15   Tatyana Kirichenko, PA-C   BP 105/54 mmHg  Pulse 77  Temp(Src) 98.7 F (37.1 C)  Resp 16  Ht 5\' 4"  (1.626 m)  Wt 161 lb (73.029 kg)  BMI 27.62 kg/m2  SpO2 100%  LMP 03/18/2015 (Approximate) Physical Exam  Constitutional: She is oriented to person, place, and time. She appears well-developed and well-nourished.  HENT:  Head: Normocephalic.  Eyes: Conjunctivae are normal.  Neck: Neck supple.  Cardiovascular: Normal rate and regular rhythm.   Pulmonary/Chest: Effort normal.  Musculoskeletal:  Right arm: Lateral antecubital fossa abscess that is erythematous, warm to touch, TTP, and fluctuant. It has a sharp demarcated round border measuring 5 centimeters in diameter with surrounding cellulitis that extends 5 cm from the fluctuant part of the abscess. He is able to flex and extend the arm at the elbow. Good radial pulse.  Neurological: She is alert  and oriented to person, place, and time.  Skin: Skin is warm and dry.  Nursing note and vitals reviewed.   ED Course  Procedures (including critical care time) Labs Review Labs Reviewed - No data to display  Imaging Review No results found.   EKG Interpretation None     INCISION AND DRAINAGE Performed by: Catha Gosselin Consent: Verbal consent obtained. Risks and benefits: risks, benefits and alternatives were discussed Type: abscess Body area: Right lateral antecubital fossa Anesthesia: local infiltration Incision was made with an 11 blade scalpel. Local anesthetic: lidocaine 2% with epinephrine Anesthetic total: 2 ml Complexity: simple Blunt dissection  to break up loculations Drainage: purulent Drainage amount: 5cc Packing material: 1/4 in iodoform gauze Patient tolerance: Patient tolerated the procedure well with no immediate complications.    MDM   Final diagnoses:  Abscess of right arm  Patient presents for right arm abscess that began one week ago.The abscess was successfully drained. I put the patient on Keflex due to surrounding cellulitis. She is to have the packing removed and replaced tomorrow at urgent care since she states she cannot do it herself. The wound has been demarcated with a marker. She should return for fever or no improvement within 24 hours. She can take tylenol or motrin for pain.  I did not give her narcotic pain medication due to polysubstance abuse including heroin.     Catha Gosselin, PA-C 04/25/15 1704  Lorre Nick, MD 04/26/15 2300

## 2017-10-04 ENCOUNTER — Encounter: Payer: Self-pay | Admitting: Emergency Medicine

## 2017-10-04 ENCOUNTER — Emergency Department
Admission: EM | Admit: 2017-10-04 | Discharge: 2017-10-04 | Discharge status: Against Medical Advice | Payer: Medicaid Other | Attending: Emergency Medicine | Admitting: Emergency Medicine

## 2017-10-04 DIAGNOSIS — R109 Unspecified abdominal pain: Secondary | ICD-10-CM

## 2017-10-04 DIAGNOSIS — O219 Vomiting of pregnancy, unspecified: Secondary | ICD-10-CM

## 2017-10-04 DIAGNOSIS — Z5320 Procedure and treatment not carried out because of patient's decision for unspecified reasons: Secondary | ICD-10-CM | POA: Diagnosis not present

## 2017-10-04 DIAGNOSIS — F17210 Nicotine dependence, cigarettes, uncomplicated: Secondary | ICD-10-CM | POA: Diagnosis not present

## 2017-10-04 DIAGNOSIS — O99331 Smoking (tobacco) complicating pregnancy, first trimester: Secondary | ICD-10-CM | POA: Insufficient documentation

## 2017-10-04 DIAGNOSIS — O26891 Other specified pregnancy related conditions, first trimester: Secondary | ICD-10-CM

## 2017-10-04 DIAGNOSIS — Z79899 Other long term (current) drug therapy: Secondary | ICD-10-CM | POA: Insufficient documentation

## 2017-10-04 DIAGNOSIS — O210 Mild hyperemesis gravidarum: Secondary | ICD-10-CM | POA: Insufficient documentation

## 2017-10-04 DIAGNOSIS — Z3A01 Less than 8 weeks gestation of pregnancy: Secondary | ICD-10-CM | POA: Diagnosis not present

## 2017-10-04 DIAGNOSIS — O9989 Other specified diseases and conditions complicating pregnancy, childbirth and the puerperium: Secondary | ICD-10-CM | POA: Diagnosis not present

## 2017-10-04 DIAGNOSIS — R1030 Lower abdominal pain, unspecified: Secondary | ICD-10-CM | POA: Insufficient documentation

## 2017-10-04 LAB — CBC WITH DIFFERENTIAL/PLATELET
Basophils Absolute: 0 10*3/uL (ref 0–0.1)
Basophils Relative: 1 %
EOS ABS: 0.2 10*3/uL (ref 0–0.7)
Eosinophils Relative: 3 %
HEMATOCRIT: 36.7 % (ref 35.0–47.0)
HEMOGLOBIN: 11.5 g/dL — AB (ref 12.0–16.0)
LYMPHS ABS: 2.8 10*3/uL (ref 1.0–3.6)
LYMPHS PCT: 36 %
MCH: 22.8 pg — AB (ref 26.0–34.0)
MCHC: 31.2 g/dL — AB (ref 32.0–36.0)
MCV: 73.1 fL — AB (ref 80.0–100.0)
MONOS PCT: 8 %
Monocytes Absolute: 0.7 10*3/uL (ref 0.2–0.9)
NEUTROS ABS: 4.2 10*3/uL (ref 1.4–6.5)
NEUTROS PCT: 52 %
Platelets: 306 10*3/uL (ref 150–440)
RBC: 5.02 MIL/uL (ref 3.80–5.20)
RDW: 17.2 % — ABNORMAL HIGH (ref 11.5–14.5)
WBC: 8 10*3/uL (ref 3.6–11.0)

## 2017-10-04 LAB — BASIC METABOLIC PANEL
Anion gap: 9 (ref 5–15)
BUN: 8 mg/dL (ref 6–20)
CHLORIDE: 102 mmol/L (ref 101–111)
CO2: 24 mmol/L (ref 22–32)
CREATININE: 0.52 mg/dL (ref 0.44–1.00)
Calcium: 9.2 mg/dL (ref 8.9–10.3)
GFR calc Af Amer: >60 mL/min (ref >60)
GFR calc non Af Amer: >60 mL/min (ref >60)
Glucose, Bld: 116 mg/dL — ABNORMAL HIGH (ref 65–99)
POTASSIUM: 3.2 mmol/L — AB (ref 3.5–5.1)
SODIUM: 135 mmol/L (ref 135–145)

## 2017-10-04 LAB — HCG, QUANTITATIVE, PREGNANCY: hCG, Beta Chain, Quant, S: 1132 m[IU]/mL — ABNORMAL HIGH (ref <5)

## 2017-10-04 LAB — POCT PREGNANCY, URINE: PREG TEST UR: POSITIVE — AB

## 2017-10-04 MED ORDER — METOCLOPRAMIDE HCL 10 MG PO TABS: 10.0000 mg | ORAL_TABLET | Freq: Once | ORAL | Status: DC

## 2017-10-04 NOTE — ED Notes (Signed)
Patient walking out of room.  Saw Dr. Don PerkingVeronese and stopped and said she needed to leave.  Dr. Don PerkingVeronese stated patient would need to sign out AMA if she leaves before treatment is finished.  Patient states she understood and left before AMA paperwork could be signed.

## 2017-10-04 NOTE — ED Triage Notes (Addendum)
Patient presents to the ED with postive pregnancy test on Saturday.  Patient states, "For the past week I'm sometimes feeling nauseous and sometimes I can't keep food down and there is some little pain in my abdomen".  Patient states, "It's probably normal but my last pregnancy was in jail so I don't really remember how it was.  I also just want to know how the baby is doing.  I would like to get an ultrasound."   Patient is in no obvious distress at this time, showing pictures to family members on her phone.

## 2017-10-04 NOTE — ED Provider Notes (Signed)
[facility] Emergency Department Provider Note  ____________________________________________  Time seen: Approximately 5:42 PM  I have reviewed the triage vital signs and the nursing notes.   HISTORY  Chief Complaint Emesis During Pregnancy   HPI Meghan Bullock is a 26 y.o. female G4P1 at estimated [redacted] weeks GA with h/o former heroin abuse on Suboxone who presents for evaluation of nausea, vomiting, and abdominal pain. Patient reports one week of nausea and vomiting and intermittent mild cramping lower abdominal pain. She took a pregnancy test 4 days ago which was positive. LMP was in the end of September. Patient has had one full term pregnancy and 2 miscarriages. No abdominal pain at this time. She denies fever, chills, dysuria, hematuria, vaginal bleeding, vaginal discharged, diarrhea, or constipation. She reports that since finding out she was pregnant she has been trying to wean herself off of Suboxone and went from 2 pills a day to half a pill a day.  Past Medical History:  Diagnosis Date  . Anxiety   . Bipolar 1 disorder (HCC)   . Chlamydia   . Heroin abuse (HCC)   . Mononucleosis   . Ovarian cyst   . Polysubstance abuse [facility](HCC)     Patient Active Problem List   Diagnosis Date Noted  . Preterm premature rupture of membranes (PPROM) with unknown onset of labor 05/12/2014  . Encephalopathy, toxic 10/03/2013  . Benzodiazepine abuse (HCC) 10/03/2013  . Heroin abuse [facility](HCC)     Past Surgical History:  Procedure Laterality Date  . NO PAST SURGERIES      Prior to Admission medications   Medication Sig Start Date End Date Taking? Authorizing Provider  cephALEXin (KEFLEX) 500 MG capsule Take 1 capsule (500 mg total) by mouth 4 (four) times daily. 04/25/15   Patel-Mills, Lorelle FormosaHanna, PA-C  ciprofloxacin (CIPRO) 250 MG tablet Take 1 tablet (250 mg total) by mouth stat. 04/04/15   Sharyn CreamerQuale, Mark, MD  sertraline (ZOLOFT) 50 MG tablet Take 50 mg by mouth daily.     [provider]    Allergies Patient has no active allergies.  No family history on file.  Social History Social History   Tobacco Use  . Smoking status: Current Every Day Smoker    Packs/day: 1.00    Years: 5.00    Pack years: 5.00    Types: Cigarettes  . Smokeless tobacco: Never Used  Substance Use Topics  . Alcohol use: No  . Drug use: Yes    Types: IV, Heroin    Comment: Smokes marijuana twice a week; heroin  LAST USED MARIJUANA- FEB,  LAST TIME USED HEROIN  2-6.-2015    Review of Systems  Constitutional: Negative for fever. Eyes: Negative for visual changes. ENT: Negative for sore throat. Neck: No neck pain  Cardiovascular: Negative for chest pain. Respiratory: Negative for shortness of breath. Gastrointestinal: + intermittent cramping abdominal pain, nausea, and  Vomiting. No diarrhea. Genitourinary: Negative for dysuria. Musculoskeletal: Negative for back pain. Skin: Negative for rash. Neurological: Negative for headaches, weakness or numbness. Psych: No SI or HI  ____________________________________________   PHYSICAL EXAM:  VITAL SIGNS: ED Triage Vitals [10/04/17 1607]  Enc Vitals Group     BP (!) 133/91     Pulse Rate 98     Resp 18     Temp 98.3 F (36.8 C)     Temp Source Oral     SpO2 100 %     Weight 156 lb (70.8 kg)  Height 5\' 4"  (1.626 m)     Head Circumference      Peak Flow      Pain Score 4     Pain Loc      Pain Edu?      Excl. in GC?     Constitutional: Alert and oriented. Well appearing and in no apparent distress. HEENT:      Head: Normocephalic and atraumatic.         Eyes: Conjunctivae are normal. Sclera is non-icteric.       Mouth/Throat: Mucous membranes are moist.       Neck: Supple with no signs of meningismus. Cardiovascular: Regular rate and rhythm. No murmurs, gallops, or rubs. 2+ symmetrical distal pulses are present in all extremities. No JVD. Respiratory: Normal respiratory effort. Lungs are clear  to auscultation bilaterally. No wheezes, crackles, or rhonchi.  Gastrointestinal: Soft, non tender, and non distended with positive bowel sounds. No rebound or guarding. Genitourinary: No CVA tenderness. Musculoskeletal: Nontender with normal range of motion in all extremities. No edema, cyanosis, or erythema of extremities. Neurologic: Normal speech and language. Face is symmetric. Moving all extremities. No gross focal neurologic deficits are appreciated. Skin: Skin is warm, dry and intact. No rash noted. Psychiatric: Mood and affect are normal. Speech and behavior are normal.  ____________________________________________   LABS (all labs ordered are listed, but only abnormal results are displayed)  Labs Reviewed  HCG, QUANTITATIVE, PREGNANCY - Abnormal; Notable for the following components:      Result Value   hCG, Beta Chain, Quant, S 1,132 (*)    All other components within normal limits  POCT PREGNANCY, URINE - Abnormal; Notable for the following components:   Preg Test, Ur POSITIVE (*)    All other components within normal limits  CBC WITH DIFFERENTIAL/PLATELET  BASIC METABOLIC PANEL   ____________________________________________  EKG  none  ____________________________________________  RADIOLOGY  TVUS: not done  ____________________________________________   PROCEDURES  Procedure(s) performed: None Procedures Critical Care performed:  None ____________________________________________   INITIAL IMPRESSION / ASSESSMENT AND PLAN / ED COURSE   26 y.o. female G4P1 at estimated [redacted] weeks GA with h/o former heroin abuse on Suboxone who presents for evaluation of nausea, vomiting, and intermittent abdominal pain for 1 week. Patient is well-appearing, in no distress, normal vital signs, abdomen is soft with no tenderness throughout. We'll do a pelvic exam to rule out STDs. We'll check basic blood work to rule out dehydration or UTI. HCG is 1100 we'll send patient for a  transvaginal ultrasound to rule out ectopic.  Clinical Course as of Oct 04 1802  Wed Oct 04, 2017  1801 Patient received a phone call with an emergency with her son and left the emergency department. I explained to her that we have been unable to rule out dehydration, UTI, confirm her pregnancy, or rule out an ectopic pregnancy or an STD. Recommended that she returns to the emergency room as soon as she can to continue her evaluation. Patient reports that she will try to come back in the morning. No labs have been done yet other than a beta Quant.  [CV]    Clinical Course User Index [CV] Don PerkingVeronese, [place], MD     As part of my medical decision making, I reviewed the following data within the electronic MEDICAL RECORD NUMBER Labs reviewed , Notes from prior ED visits and [place] Controlled Substance Database    Pertinent labs & imaging results that were available during my care of the  patient were reviewed by me and considered in my medical decision making (see chart for details).    ____________________________________________   FINAL CLINICAL IMPRESSION(S) / ED DIAGNOSES  Final diagnoses:  Abdominal pain during pregnancy in first trimester  Nausea and vomiting during pregnancy      NEW MEDICATIONS STARTED DURING THIS VISIT:  This SmartLink is deprecated. Use AVSMEDLIST instead to display the medication list for a patient.   Note:  This document was prepared using Dragon voice recognition software and may include unintentional dictation errors.    Nita Sickle, MD 10/04/17 [id number]

## 2017-10-06 ENCOUNTER — Emergency Department
Admission: EM | Admit: 2017-10-06 | Discharge: 2017-10-06 | Disposition: A | Discharge status: Home / Self Care | Payer: Medicaid Other | Attending: Emergency Medicine | Admitting: Emergency Medicine

## 2017-10-06 ENCOUNTER — Other Ambulatory Visit: Payer: Self-pay

## 2017-10-06 ENCOUNTER — Encounter: Payer: Self-pay | Admitting: Emergency Medicine

## 2017-10-06 ENCOUNTER — Telehealth: Payer: Self-pay | Admitting: Emergency Medicine

## 2017-10-06 DIAGNOSIS — Z5321 Procedure and treatment not carried out due to patient leaving prior to being seen by health care provider: Secondary | ICD-10-CM | POA: Insufficient documentation

## 2017-10-06 DIAGNOSIS — R079 Chest pain, unspecified: Secondary | ICD-10-CM | POA: Insufficient documentation

## 2017-10-06 LAB — CBC WITH DIFFERENTIAL/PLATELET
BASOS PCT: 0 %
Basophils Absolute: 0 10*3/uL (ref 0–0.1)
Eosinophils Absolute: 0.1 10*3/uL (ref 0–0.7)
Eosinophils Relative: 2 %
HEMATOCRIT: 36.4 % (ref 35.0–47.0)
HEMOGLOBIN: 11.5 g/dL — AB (ref 12.0–16.0)
LYMPHS ABS: 2.9 10*3/uL (ref 1.0–3.6)
LYMPHS PCT: 34 %
MCH: 23.1 pg — AB (ref 26.0–34.0)
MCHC: 31.6 g/dL — ABNORMAL LOW (ref 32.0–36.0)
MCV: 73.2 fL — AB (ref 80.0–100.0)
MONO ABS: 0.6 10*3/uL (ref 0.2–0.9)
MONOS PCT: 7 %
NEUTROS ABS: 4.7 10*3/uL (ref 1.4–6.5)
NEUTROS PCT: 57 %
Platelets: 332 10*3/uL (ref 150–440)
RBC: 4.98 MIL/uL (ref 3.80–5.20)
RDW: 17.5 % — AB (ref 11.5–14.5)
WBC: 8.4 10*3/uL (ref 3.6–11.0)

## 2017-10-06 LAB — BASIC METABOLIC PANEL
Anion gap: 9 (ref 5–15)
BUN: 7 mg/dL (ref 6–20)
CHLORIDE: 103 mmol/L (ref 101–111)
CO2: 26 mmol/L (ref 22–32)
Calcium: 9.2 mg/dL (ref 8.9–10.3)
Creatinine, Ser: 0.52 mg/dL (ref 0.44–1.00)
GFR calc non Af Amer: >60 mL/min (ref >60)
GLUCOSE: 121 mg/dL — AB (ref 65–99)
Potassium: 3.1 mmol/L — ABNORMAL LOW (ref 3.5–5.1)
Sodium: 138 mmol/L (ref 135–145)

## 2017-10-06 LAB — TROPONIN I: Troponin I: <0.03 ng/mL (ref <0.03)

## 2017-10-06 NOTE — Telephone Encounter (Signed)
Called patient due to lwot to inquire about condition and follow up plans. She says she continues to have nausea and vomiting. Has had chest pain several times and now she has something in her foot that needs to be checked.  She says she is worried about her pregnancy, and has made an appointment with obgyn in 2 weeks.  The patient is answering appropriately, but her speech is rapid.  She says she does want to come back to finish her exam and treatment, and also because of her foot.  Says she is at work now, but may return this afternoon.  She has no pcp.

## 2017-10-06 NOTE — ED Notes (Signed)
Pt immediately stating she no longer wants to be treated as she was being brought back to treatment room. Pt states she will follow up with her PCP. Dr Fanny BienQuale made aware and pt to be d/c'd LWBS after Triage.

## 2017-10-06 NOTE — ED Triage Notes (Signed)
Pt here for chest pain since yesterday.  Intermittent. Had 3 episodes yesterday.  "it could be anxiety I had court with my son yesterday, it just makes me nervous". Pt is about [redacted] weeks pregnant per report.  Also worried about baby but has not symptoms r/t pregnancy. Has had some nausea but unsure if from being pregnant or because anxiety. Has numbness to neck and swelling to back of neck.  Also has glass in right hand and foot per report but will not inform RN how happened.

## 2017-10-08 ENCOUNTER — Emergency Department: Payer: Medicaid Other

## 2017-10-08 ENCOUNTER — Emergency Department
Admission: EM | Admit: 2017-10-08 | Discharge: 2017-10-08 | Disposition: A | Discharge status: Home / Self Care | Payer: Medicaid Other | Attending: Emergency Medicine | Admitting: Emergency Medicine

## 2017-10-08 DIAGNOSIS — M79672 Pain in left foot: Secondary | ICD-10-CM | POA: Diagnosis present

## 2017-10-08 DIAGNOSIS — Z5321 Procedure and treatment not carried out due to patient leaving prior to being seen by health care provider: Secondary | ICD-10-CM | POA: Insufficient documentation

## 2017-10-08 NOTE — ED Triage Notes (Signed)
Pt states was angry and kicked a glass door approx 2 hours pta. Pt with extensive lacerations noted to left foot, left lower leg. Bleeding is controlled with pressure dressing. Pt states she is 3-[redacted] weeks pregnant and would also like and ultrasound. Cms intact to toes.

## 2017-10-08 NOTE — ED Notes (Signed)
Pt not in lobby per michele, first nurse. Xray was unable to locate pt either for xray. Pt is no longer in flex lobby.

## 2017-10-09 ENCOUNTER — Emergency Department (HOSPITAL_COMMUNITY)
Admission: EM | Admit: 2017-10-09 | Discharge: 2017-10-09 | Disposition: A | Discharge status: Home / Self Care | Payer: Medicaid Other | Attending: Emergency Medicine | Admitting: Emergency Medicine

## 2017-10-09 ENCOUNTER — Emergency Department (HOSPITAL_COMMUNITY): Payer: Medicaid Other

## 2017-10-09 ENCOUNTER — Encounter (HOSPITAL_COMMUNITY): Payer: Self-pay | Admitting: Emergency Medicine

## 2017-10-09 DIAGNOSIS — O26891 Other specified pregnancy related conditions, first trimester: Secondary | ICD-10-CM

## 2017-10-09 DIAGNOSIS — Z3A01 Less than 8 weeks gestation of pregnancy: Secondary | ICD-10-CM | POA: Insufficient documentation

## 2017-10-09 DIAGNOSIS — S91311A Laceration without foreign body, right foot, initial encounter: Secondary | ICD-10-CM | POA: Insufficient documentation

## 2017-10-09 DIAGNOSIS — O9A211 Injury, poisoning and certain other consequences of external causes complicating pregnancy, first trimester: Secondary | ICD-10-CM | POA: Insufficient documentation

## 2017-10-09 DIAGNOSIS — O99331 Smoking (tobacco) complicating pregnancy, first trimester: Secondary | ICD-10-CM | POA: Diagnosis not present

## 2017-10-09 DIAGNOSIS — Y999 Unspecified external cause status: Secondary | ICD-10-CM | POA: Insufficient documentation

## 2017-10-09 DIAGNOSIS — Z79899 Other long term (current) drug therapy: Secondary | ICD-10-CM | POA: Diagnosis not present

## 2017-10-09 DIAGNOSIS — Y939 Activity, unspecified: Secondary | ICD-10-CM | POA: Diagnosis not present

## 2017-10-09 DIAGNOSIS — Z23 Encounter for immunization: Secondary | ICD-10-CM | POA: Diagnosis not present

## 2017-10-09 DIAGNOSIS — W25XXXA Contact with sharp glass, initial encounter: Secondary | ICD-10-CM | POA: Diagnosis not present

## 2017-10-09 DIAGNOSIS — Y929 Unspecified place or not applicable: Secondary | ICD-10-CM | POA: Diagnosis not present

## 2017-10-09 DIAGNOSIS — S91011A Laceration without foreign body, right ankle, initial encounter: Secondary | ICD-10-CM | POA: Diagnosis not present

## 2017-10-09 DIAGNOSIS — F17210 Nicotine dependence, cigarettes, uncomplicated: Secondary | ICD-10-CM | POA: Insufficient documentation

## 2017-10-09 DIAGNOSIS — R109 Unspecified abdominal pain: Secondary | ICD-10-CM

## 2017-10-09 DIAGNOSIS — R102 Pelvic and perineal pain: Secondary | ICD-10-CM

## 2017-10-09 LAB — CBC WITH DIFFERENTIAL/PLATELET
BASOS ABS: 0 10*3/uL (ref 0.0–0.1)
BASOS PCT: 0 %
EOS ABS: 0.1 10*3/uL (ref 0.0–0.7)
EOS PCT: 1 %
HCT: 35.4 % — ABNORMAL LOW (ref 36.0–46.0)
Hemoglobin: 10.8 g/dL — ABNORMAL LOW (ref 12.0–15.0)
Lymphocytes Relative: 26 %
Lymphs Abs: 3 10*3/uL (ref 0.7–4.0)
MCH: 23.3 pg — ABNORMAL LOW (ref 26.0–34.0)
MCHC: 30.5 g/dL (ref 30.0–36.0)
MCV: 76.3 fL — AB (ref 78.0–100.0)
MONO ABS: 0.8 10*3/uL (ref 0.1–1.0)
Monocytes Relative: 7 %
Neutro Abs: 7.6 10*3/uL (ref 1.7–7.7)
Neutrophils Relative %: 66 %
PLATELETS: 326 10*3/uL (ref 150–400)
RBC: 4.64 MIL/uL (ref 3.87–5.11)
RDW: 16.8 % — AB (ref 11.5–15.5)
WBC: 11.4 10*3/uL — ABNORMAL HIGH (ref 4.0–10.5)

## 2017-10-09 LAB — HCG, QUANTITATIVE, PREGNANCY: hCG, Beta Chain, Quant, S: 6397 m[IU]/mL — ABNORMAL HIGH (ref <5)

## 2017-10-09 LAB — BASIC METABOLIC PANEL
ANION GAP: 5 (ref 5–15)
BUN: 6 mg/dL (ref 6–20)
CALCIUM: 9.1 mg/dL (ref 8.9–10.3)
CO2: 25 mmol/L (ref 22–32)
Chloride: 106 mmol/L (ref 101–111)
Creatinine, Ser: 0.56 mg/dL (ref 0.44–1.00)
GLUCOSE: 107 mg/dL — AB (ref 65–99)
Potassium: 3.6 mmol/L (ref 3.5–5.1)
Sodium: 136 mmol/L (ref 135–145)

## 2017-10-09 MED ORDER — CEPHALEXIN 500 MG PO CAPS: 500.0000 mg | ORAL_CAPSULE | Freq: Four times a day (QID) | ORAL | 0 refills | Status: DC

## 2017-10-09 MED ORDER — LIDOCAINE-EPINEPHRINE (PF) 2 %-1:200000 IJ SOLN
10.0000 mL | Freq: Once | INTRAMUSCULAR | Status: AC
  Administered 2017-10-09: 10 mL
  Filled 2017-10-09: qty 20

## 2017-10-09 MED ORDER — TETANUS-DIPHTH-ACELL PERTUSSIS 5-2.5-18.5 LF-MCG/0.5 IM SUSP
0.5000 mL | Freq: Once | INTRAMUSCULAR | Status: AC
  Administered 2017-10-09: 0.5 mL via INTRAMUSCULAR
  Filled 2017-10-09: qty 0.5

## 2017-10-09 MED ORDER — ACETAMINOPHEN 500 MG PO TABS: 500.0000 mg | ORAL_TABLET | Freq: Four times a day (QID) | ORAL | 0 refills | Status: DC | PRN

## 2017-10-09 NOTE — Discharge Instructions (Signed)
Do not soak your foot or leg in standing water such as while bathing or swimming.  Keep the wound covered to make sure it remains clean and dry.  Use crutches when walking to prevent from putting weight on your right foot over the next few days.  Take Keflex as prescribed until finished.  This is safe in pregnancy.  Follow-up with your OB/GYN for further prenatal care.  Return for suture removal in 2 weeks.  You may also return for new or concerning symptoms.

## 2017-10-09 NOTE — ED Triage Notes (Signed)
Patient kicked a glass door this evening , presents with multiple small lacerations at right medial ankle area with minimal bleeding , pt. Added mild mid abdominal pain after she fell on her buttocks , she is approx. [redacted] weeks pregnant G4P1.

## 2017-10-09 NOTE — ED Provider Notes (Signed)
[facility]   CSN: [id number] Arrival date & time: 10/09/17  0016     History   Chief Complaint Chief Complaint  Patient presents with  . Foot Injury  . Abdominal Pain    HPI Meghan Bullock is a 26 y.o. female.   26 year old 884P1 female, currently [redacted] weeks pregnant, presents to the emergency department for evaluation of lacerations to her right lower extremity.  She states that she was angry and kicked a glass door this evening causing her injuries.  She reports mild discomfort at the site of her lacerations.  Pain is aggravated with weightbearing.  The patient is not on blood thinners.  No medications taken prior to arrival.  She cannot recall the date of her last tetanus shot.  Patient states that she sat on her bottom after sustaining her injuries at which time she developed mild right lower abdominal discomfort.  She has had persistent nausea each morning which she attributes to recent pregnancy.  She has not had any vaginal bleeding, vaginal discharge, gush of vaginal fluid.  She is scheduled to follow-up with an OB/GYN for prenatal care in the next week.  She denies history of ectopic pregnancy.     Past Medical History:  Diagnosis Date  . Anxiety   . Bipolar 1 disorder (HCC)   . Chlamydia   . Heroin abuse (HCC)   . Mononucleosis   . Ovarian cyst   . Polysubstance abuse [facility](HCC)     Patient Active Problem List   Diagnosis Date Noted  . Preterm premature rupture of membranes (PPROM) with unknown onset of labor 05/12/2014  . Encephalopathy, toxic 10/03/2013  . Benzodiazepine abuse (HCC) 10/03/2013  . Heroin abuse [facility](HCC)     Past Surgical History:  Procedure Laterality Date  . NO PAST SURGERIES      OB History    Gravida Para Term Preterm AB Living   4 1   1 2 1    SAB TAB Ectopic Multiple Live Births   2       1       Home Medications    Prior to Admission medications   Medication Sig Start Date End  Date Taking? Authorizing Provider  buprenorphine (SUBUTEX) 8 MG SUBL SL tablet Place 8 mg 2 (two) times daily under the tongue.   Yes [provider]  acetaminophen (TYLENOL) 500 MG tablet Take 1 tablet (500 mg total) every 6 (six) hours as needed by mouth for mild pain or moderate pain. 10/09/17   Antony MaduraHumes, Kayln Garceau, PA-C  cephALEXin (KEFLEX) 500 MG capsule Take 1 capsule (500 mg total) 4 (four) times daily by mouth. 10/09/17   Antony MaduraHumes, Kelden Lavallee, PA-C    Family History No family history on file.  Social History Social History   Tobacco Use  . Smoking status: Current Every Day Smoker    Packs/day: 1.00    Years: 5.00    Pack years: 5.00    Types: Cigarettes  . Smokeless tobacco: Never Used  Substance Use Topics  . Alcohol use: No  . Drug use: Yes    Types: IV, Heroin    Comment: Smokes marijuana twice a week; heroin  LAST USED MARIJUANA- FEB,  LAST TIME USED HEROIN  2-6.-2015     Allergies   Patient has no known allergies.   Review of Systems Review of Systems Ten systems reviewed and are negative for acute change, except as noted in the HPI.    Physical  Exam Updated Vital Signs BP 117/68   Pulse 90   Temp 98.4 F (36.9 C) (Oral)   Resp 18   LMP 08/15/2017 (Exact Date)   SpO2 100%   Physical Exam  Constitutional: She is oriented to person, place, and time. She appears well-developed and well-nourished. No distress.  Nontoxic appearing and in no acute distress  HENT:  Head: Normocephalic and atraumatic.  Eyes: Conjunctivae and EOM are normal. No scleral icterus.  Neck: Normal range of motion.  Cardiovascular: Normal rate, regular rhythm and intact distal pulses.  DP pulse 2+ in the right lower extremity.  Capillary refill brisk in all digits.  Pulmonary/Chest: Effort normal. No respiratory distress.  Respirations even and unlabored  Abdominal: Soft. She exhibits no distension and no mass. There is no tenderness. There is no guarding.  Soft abdomen.  No  reproducible tenderness.  No peritoneal signs or masses.  Musculoskeletal: Normal range of motion.  Neurological: She is alert and oriented to person, place, and time. She exhibits normal muscle tone. Coordination normal.  Sensation to light touch intact in the right lower extremity.  Patient able to wiggle all toes.  Skin: Skin is warm and dry. No rash noted. She is not diaphoretic. No pallor.  Multiple lacerations to the medial right foot and ankle.  See imaging below.  No palpable, pulsatile bleeding.  Psychiatric: She has a normal mood and affect. Her behavior is normal.  Nursing note and vitals reviewed.    ED Treatments / Results  Labs (all labs ordered are listed, but only abnormal results are displayed) Labs Reviewed  CBC WITH DIFFERENTIAL/PLATELET - Abnormal; Notable for the following components:      Result Value   WBC 11.4 (*)    Hemoglobin 10.8 (*)    HCT 35.4 (*)    MCV 76.3 (*)    MCH 23.3 (*)    RDW 16.8 (*)    All other components within normal limits  BASIC METABOLIC PANEL - Abnormal; Notable for the following components:   Glucose, Bld 107 (*)    All other components within normal limits  HCG, QUANTITATIVE, PREGNANCY - Abnormal; Notable for the following components:   hCG, Beta Chain, Quant, S 6,397 (*)    All other components within normal limits    EKG  EKG Interpretation None       Radiology Dg Ankle 2 Views Right  Result Date: 10/09/2017 CLINICAL DATA:  26 y/o F; take glass door with multiple lacerations. EXAM: RIGHT ANKLE - 2 VIEW COMPARISON:  None. FINDINGS: There is no evidence of fracture, dislocation, or joint effusion. There is no evidence of arthropathy or other focal bone abnormality. Multiple soft tissue defects. No radiopaque foreign body identified. IMPRESSION: No acute fracture or dislocation. Multiple soft tissue defects. No radiopaque foreign body identified. Electronically Signed   By: Mitzi HansenLance  Furusawa-Stratton M.D.   On: 10/09/2017 01:29     Procedures Procedures (including critical care time)  LACERATION REPAIR Performed by: Antony MaduraHUMES, Missey Hasley Authorized by: Antony MaduraHUMES, Jlon Betker Consent: Verbal consent obtained. Risks and benefits: risks, benefits and alternatives were discussed Consent given by: patient Patient identity confirmed: provided demographic data Prepped and Draped in normal sterile fashion Wound explored  Laceration Location: RLE, curved, on dorsum of foot  Laceration Length: 4cm  No Foreign Bodies seen or palpated  Anesthesia: local infiltration  Local anesthetic: lidocaine 2% with epinephrine  Anesthetic total: 5 ml  Irrigation method: syringe Amount of cleaning: standard  Skin closure: 5-0 prolene  Number of  sutures: 5  Technique: simple interrupted  Patient tolerance: Patient tolerated the procedure well with no immediate complications.  LACERATION REPAIR Performed by: Antony Madura Authorized by: Antony Madura Consent: Verbal consent obtained. Risks and benefits: risks, benefits and alternatives were discussed Consent given by: patient Patient identity confirmed: provided demographic data Prepped and Draped in normal sterile fashion Wound explored  Laceration Location: RLE, lateral  Laceration Length: 8cm  No Foreign Bodies seen or palpated  Anesthesia: local infiltration  Local anesthetic: lidocaine 2% with epinephrine  Anesthetic total: 5 ml  Irrigation method: syringe Amount of cleaning: standard  Skin closure: 3-0 prolene and 5-0 prolene  Number of sutures: 7  Technique: simple interrupted (6) and horizontal mattress (1)  Patient tolerance: Patient tolerated the procedure well with no immediate complications.  LACERATION REPAIR Performed by: Antony Madura Authorized by: Antony Madura Consent: Verbal consent obtained. Risks and benefits: risks, benefits and alternatives were discussed Consent given by: patient Patient identity confirmed: provided demographic  data Prepped and Draped in normal sterile fashion Wound explored  Laceration Location: RLE, medial heel  Laceration Length: 3cm  No Foreign Bodies seen or palpated  Anesthesia: local infiltration  Local anesthetic: lidocaine 2% with epinephrine  Anesthetic total: 5 ml  Irrigation method: syringe Amount of cleaning: standard  Skin closure: staples  Number of sutures: 3  Technique: simple  Patient tolerance: Patient tolerated the procedure well with no immediate complications.  LACERATION REPAIR Performed by: Antony Madura Authorized by: Antony Madura Consent: Verbal consent obtained. Risks and benefits: risks, benefits and alternatives were discussed Consent given by: patient Patient identity confirmed: provided demographic data Prepped and Draped in normal sterile fashion Wound explored  Laceration Location: RLE, anterior medial  Laceration Length: 5cm  No Foreign Bodies seen or palpated  Anesthesia: local infiltration  Local anesthetic: lidocaine 2% with epinephrine  Anesthetic total: 5 ml  Irrigation method: syringe Amount of cleaning: standard  Skin closure: staples  Number of sutures: 3  Technique: simple  Patient tolerance: Patient tolerated the procedure well with no immediate complications.  LACERATION REPAIR Performed by: Antony Madura Authorized by: Antony Madura Consent: Verbal consent obtained. Risks and benefits: risks, benefits and alternatives were discussed Consent given by: patient Patient identity confirmed: provided demographic data Prepped and Draped in normal sterile fashion Wound explored  Laceration Location: RLE, posterior medial  Laceration Length: 6cm  No Foreign Bodies seen or palpated  Anesthesia: local infiltration  Local anesthetic: lidocaine 2% with epinephrine  Anesthetic total: 5 ml  Irrigation method: syringe Amount of cleaning: standard  Skin closure: staples  Number of sutures: 3  Technique:  simple  Patient tolerance: Patient tolerated the procedure well with no immediate complications.  LACERATION REPAIR Performed by: Antony Madura Authorized by: Antony Madura Consent: Verbal consent obtained. Risks and benefits: risks, benefits and alternatives were discussed Consent given by: patient Patient identity confirmed: provided demographic data Prepped and Draped in normal sterile fashion Wound explored  Laceration Location: RLE  Laceration Length: 6cm  No Foreign Bodies seen or palpated  Anesthesia: local infiltration  Local anesthetic: lidocaine 2% with epinephrine  Anesthetic total: 5 ml  Irrigation method: syringe Amount of cleaning: standard  Skin closure: staples  Number of sutures: 2  Technique: simple  Patient tolerance: Patient tolerated the procedure well with no immediate complications.   Medications Ordered in ED Medications  Tdap (BOOSTRIX) injection 0.5 mL (0.5 mLs Intramuscular Given 10/09/17 0122)  lidocaine-EPINEPHrine (XYLOCAINE W/EPI) 2 %-1:200000 (PF) injection 10 mL (10 mLs Infiltration Given  by Other 10/09/17 0122)    2:43 AM Attempted bedside US to confirm IUP. Unable to visualize fetus or yolk sac on transabdominal view.  4:52 AM Lacerations repaired; patient tolerated well.      Initial Impression / Assessment and Plan / ED Course  I have reviewed the triage vital signs and the nursing notes.  Pertinent labs & imaging results that were available during my care of the patient were reviewed by me and considered in my medical decision making (see chart for details).     Patient presenting for RLE pain 2/2 lacerations after kicking the glass frame of a door tonight. Patient neurovascularly intact. No radiopaque FB on imaging. No acute fracture or dislocation. Tdap booster given. Lacerations occurred < 8 hours prior to repair which was well tolerated. Patient has no comorbidities to effect normal wound healing. Will start on  Keflex for infection coverage given the depth and extent of wounds. Crutches also provided for WBAT.  Discussed suture home care with patient and answered questions. Patient to follow up for wound check and suture removal in 14 days.   Patient also complaining of abdominal pain in the setting of recent pregnancy.  Last menstrual period was the middle of September.  HCG is positive up to 6397.  This does correspond with estimated gestational age of [redacted] weeks 4 days on ultrasound which shows a single intrauterine gestational sac; no fetal pole identified.  The patient has been instructed to follow-up with an OB/GYN for additional prenatal care. Return precautions discussed and provided. Patient discharged in stable condition with no unaddressed concerns.   Final Clinical Impressions(s) / ED Diagnoses   Final diagnoses:  Laceration of right foot, initial encounter  Abdominal pain during pregnancy in first trimester    ED Discharge Orders        Ordered    cephALEXin (KEFLEX) 500 MG capsule  4 times daily     10/09/17 0448    acetaminophen (TYLENOL) 500 MG tablet  Every 6 hours PRN     10/09/17 0448       Antony Madura, PA-C 10/09/17 0546    Antony Madura, PA-C 10/09/17 1610    Geoffery Lyons, MD 10/09/17 [phone]

## 2017-10-09 NOTE — ED Notes (Signed)
Pt given Coke and saltine crackers given; pt asking when suturing will occur, cart and lidocaine at bedside.

## 2017-10-09 NOTE — ED Notes (Signed)
Pt taken to US

## 2017-10-11 ENCOUNTER — Emergency Department
Admission: EM | Admit: 2017-10-11 | Discharge: 2017-10-11 | Discharge status: Against Medical Advice | Payer: Medicaid Other

## 2017-10-11 NOTE — ED Notes (Signed)
No answer when called from lobby 

## 2017-10-11 NOTE — ED Notes (Signed)
No answer when called several times from lobby 

## 2017-10-11 NOTE — ED Triage Notes (Signed)
Patient ambulatory to STAT desk with steady gait, without difficulty or distress noted; pt reports that she is [redacted]wks pregnant having abd cramping, N/V; pt is currently eating bag of cheese crackers and carrying bowl of noodles; pt told several times not to eat or drink until evaluated by provider but pt continues to eat stating that she is hungry

## 2017-10-11 NOTE — ED Notes (Signed)
Called several times from lobby with no answer 

## 2017-10-17 NOTE — Progress Notes (Signed)
Obstetrics & Gynecology Office Visit   Chief Complaint:  Chief Complaint  Patient presents with  . NOB    History of Present Illness: 26 year old [id number] patient presenting for ER follow up of intrauterine pregnancy of uncertain viability   HCG 6397 on 10/09/2017, with ultrasound showing gestational sac with MSD corresponding to 855w4d, yolk sac present, no fetal pole.  Ultrasound was prompted by abdominal pain.  Approximately 3 week difference from LMP dating.  The patient has had no bleeding.  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  Was incarcerated for 2 years.     Review of Systems: Review of Systems  Constitutional: Negative for chills and fever.  Gastrointestinal: Positive for heartburn, nausea and vomiting.  Psychiatric/Behavioral: Positive for substance abuse.    Past Medical History:  Past Medical History:  Diagnosis Date  . Anxiety   . Bipolar 1 disorder (HCC)   . Chlamydia   . Heroin abuse (HCC)   . Mononucleosis   . Ovarian cyst   . Polysubstance abuse [facility](HCC)     Past Surgical History:  Past Surgical History:  Procedure Laterality Date  . NO PAST SURGERIES      Gynecologic History: Patient's last menstrual period was 08/15/2017 (exact date).  Obstetric History: [id number]  Family History:  No family history on file.  Social History:  Social History   Socioeconomic History  . Marital status: Married    Spouse name: Not on file  . Number of children: Not on file  . Years of education: Not on file  . Highest education level: Not on file  Social Needs  . Financial resource strain: Not on file  . Food insecurity - worry: Not on file  . Food insecurity - inability: Not on file  . Transportation needs - medical: Not on file  . Transportation needs - non-medical: Not on file  Occupational History  . Not on file  Tobacco Use  . Smoking status: Current Every Day Smoker    Packs/day: 1.00    Years: 5.00    Pack years: 5.00      Types: Cigarettes  . Smokeless tobacco: Never Used  Substance and Sexual Activity  . Alcohol use: No  . Drug use: Yes    Types: IV, Heroin    Comment: Smokes marijuana twice a week; heroin  LAST USED MARIJUANA- FEB,  LAST TIME USED HEROIN  2-6.-2015  . Sexual activity: Yes    Birth control/protection: None  Other Topics Concern  . Not on file  Social History Narrative  . Not on file    Allergies:  No Known Allergies  Medications: Prior to Admission medications   Medication Sig Start Date End Date Taking? Authorizing Provider  acetaminophen (TYLENOL) 500 MG tablet Take 1 tablet (500 mg total) every 6 (six) hours as needed by mouth for mild pain or moderate pain. 10/09/17   Antony MaduraHumes, Kelly, PA-C  buprenorphine (SUBUTEX) 8 MG SUBL SL tablet Place 8 mg 2 (two) times daily under the tongue.    [provider]  cephALEXin (KEFLEX) 500 MG capsule Take 1 capsule (500 mg total) 4 (four) times daily by mouth. 10/09/17   Antony MaduraHumes, Kelly, PA-C    Physical Exam Vitals:   10/18/17 1437  BP: 124/68    Patient's last menstrual period was 08/15/2017 (exact date).  General: NAD HEENT: normocephalic, anicteric  Pulmonary: No increased work of breathing Abdomen: NABS, soft, non-tender, non-distended.  Umbilicus without lesions.  No hepatomegaly, splenomegaly or masses palpable. No evidence of hernia  Genitourinary:  External: Normal external female genitalia.  Normal urethral meatus, normal  Bartholin's and Skene's glands.    Vagina: Normal vaginal mucosa, no evidence of prolapse.    Cervix: Grossly normal in appearance, no bleeding  Uterus: Non-enlarged, mobile, normal contour.  No CMT  Adnexa: ovaries non-enlarged, no adnexal masses  Rectal: deferred  Lymphatic: no evidence of inguinal lymphadenopathy Extremities: no edema, erythema, or tenderness Neurologic: Grossly intact Psychiatric: mood appropriate, affect full  Female chaperone present for pelvic portions of the physical  exam  Assessment: 26 y.o. [id number] with pregnancy of uncertain viability  Plan: Problem List Items Addressed This Visit    None    Visit Diagnoses    Pregnancy with inconclusive fetal viability, single or unspecified fetus    -  Primary   Relevant Orders   US OB Comp Less 14 Wks   Urine Drug Panel 7   Hepatitis C antibody   RPR+Rh+ABO+Rub Ab+Ab Scr+CB...   Pap Lb, Ct-Ng, rfx HPV ASCU   Urine Culture   History of substance abuse       Relevant Orders   Urine Drug Panel 7   Hepatitis C antibody   Urine Culture   Cervical cancer screening       Relevant Orders   Pap Lb, Ct-Ng, rfx HPV ASCU     "Society of [employer] in Ultrasound Guidelines for Transvaginal Ultrasonographic Diagnosis of Early Pregnancy Loss" and adopted in ACOG Practice Bulletin Number 150, May 2015 (reaffirmed 2017) "Early Pregnancy Loss" - Absence of embryo with a discernable heartbeat 11 days after initial ultrasound showing a gestational sac with  yolk sac present  [X]  US in 1 week to confirm viability [X]  NOB labs drawn [X]  Previously incarcerated, previous subtance abuse obtained UDS and HepC with NOB labs  Discussed that first trimester pregnancy loss is common occurring in approximately 20% of clinically recognized first trimester pregnancies.  The patient reports sure LMP and based on US in ED 3 week lag.  This may be because patient is earlier than she believed or because pregnancy stopped forming.  If viable NOB at next visit, if not viable discuss management option for missed abortion.

## 2017-10-18 ENCOUNTER — Ambulatory Visit (INDEPENDENT_AMBULATORY_CARE_PROVIDER_SITE_OTHER): Payer: Medicaid Other | Admitting: Obstetrics and Gynecology

## 2017-10-18 ENCOUNTER — Encounter: Payer: Self-pay | Admitting: Obstetrics and Gynecology

## 2017-10-18 VITALS — BP 124/68 | Wt 157.0 lb

## 2017-10-18 DIAGNOSIS — Z87898 Personal history of other specified conditions: Secondary | ICD-10-CM | POA: Diagnosis not present

## 2017-10-18 DIAGNOSIS — Z124 Encounter for screening for malignant neoplasm of cervix: Secondary | ICD-10-CM

## 2017-10-18 DIAGNOSIS — F1911 Other psychoactive substance abuse, in remission: Secondary | ICD-10-CM

## 2017-10-18 DIAGNOSIS — O3680X0 Pregnancy with inconclusive fetal viability, not applicable or unspecified: Secondary | ICD-10-CM

## 2017-10-18 LAB — OB RESULTS CONSOLE RUBELLA ANTIBODY, IGM: Rubella: IMMUNE

## 2017-10-18 LAB — OB RESULTS CONSOLE VARICELLA ZOSTER ANTIBODY, IGG: Varicella: IMMUNE

## 2017-10-18 LAB — OB RESULTS CONSOLE HIV ANTIBODY (ROUTINE TESTING): HIV: NONREACTIVE

## 2017-10-18 LAB — OB RESULTS CONSOLE RPR: RPR: NONREACTIVE

## 2017-10-18 MED ORDER — DOXYLAMINE-PYRIDOXINE 10-10 MG PO TBEC: 2.0000 | DELAYED_RELEASE_TABLET | Freq: Every day | ORAL | 5 refills | Status: DC

## 2017-10-18 NOTE — Progress Notes (Signed)
NOB  Sharpe pains in lower left/right side N&V

## 2017-10-19 LAB — RPR+RH+ABO+RUB AB+AB SCR+CB...
ANTIBODY SCREEN: NEGATIVE
HEMATOCRIT: 34.2 % (ref 34.0–46.6)
HEMOGLOBIN: 10.5 g/dL — AB (ref 11.1–15.9)
HEP B S AG: NEGATIVE
HIV Screen 4th Generation wRfx: NONREACTIVE
MCH: 23.1 pg — AB (ref 26.6–33.0)
MCHC: 30.7 g/dL — AB (ref 31.5–35.7)
MCV: 75 fL — AB (ref 79–97)
Platelets: 382 10*3/uL — ABNORMAL HIGH (ref 150–379)
RBC: 4.55 x10E6/uL (ref 3.77–5.28)
RDW: 16.5 % — AB (ref 12.3–15.4)
RH TYPE: POSITIVE
RPR Ser Ql: NONREACTIVE
Rubella Antibodies, IGG: 5.22 index (ref >0.99)
Varicella zoster IgG: 213 index (ref >165)
WBC: 13 10*3/uL — AB (ref 3.4–10.8)

## 2017-10-19 LAB — HEPATITIS C ANTIBODY: Hep C Virus Ab: >11 s/co ratio — ABNORMAL HIGH (ref 0.0–0.9)

## 2017-10-20 LAB — URINE CULTURE

## 2017-10-23 ENCOUNTER — Encounter: Payer: Self-pay | Admitting: Obstetrics and Gynecology

## 2017-10-23 DIAGNOSIS — B1920 Unspecified viral hepatitis C without hepatic coma: Secondary | ICD-10-CM | POA: Insufficient documentation

## 2017-10-23 LAB — PAP LB, CT-NG, RFX HPV ASCU
Chlamydia, Nuc. Acid Amp: NEGATIVE
Gonococcus, Nuc. Acid Amp: NEGATIVE
PAP Smear Comment: 0

## 2017-10-24 ENCOUNTER — Telehealth: Payer: Self-pay | Admitting: Obstetrics and Gynecology

## 2017-10-24 ENCOUNTER — Encounter: Payer: Self-pay | Admitting: Obstetrics and Gynecology

## 2017-10-24 ENCOUNTER — Ambulatory Visit (INDEPENDENT_AMBULATORY_CARE_PROVIDER_SITE_OTHER): Payer: Medicaid Other | Admitting: Obstetrics and Gynecology

## 2017-10-24 ENCOUNTER — Ambulatory Visit (INDEPENDENT_AMBULATORY_CARE_PROVIDER_SITE_OTHER): Payer: Medicaid Other

## 2017-10-24 VITALS — BP 104/64 | Wt 156.0 lb

## 2017-10-24 DIAGNOSIS — O0990 Supervision of high risk pregnancy, unspecified, unspecified trimester: Secondary | ICD-10-CM

## 2017-10-24 DIAGNOSIS — O210 Mild hyperemesis gravidarum: Secondary | ICD-10-CM

## 2017-10-24 DIAGNOSIS — F1110 Opioid abuse, uncomplicated: Secondary | ICD-10-CM

## 2017-10-24 DIAGNOSIS — B1920 Unspecified viral hepatitis C without hepatic coma: Secondary | ICD-10-CM

## 2017-10-24 DIAGNOSIS — O99321 Drug use complicating pregnancy, first trimester: Secondary | ICD-10-CM

## 2017-10-24 DIAGNOSIS — O09891 Supervision of other high risk pregnancies, first trimester: Secondary | ICD-10-CM

## 2017-10-24 DIAGNOSIS — O3680X0 Pregnancy with inconclusive fetal viability, not applicable or unspecified: Secondary | ICD-10-CM

## 2017-10-24 DIAGNOSIS — F1310 Sedative, hypnotic or anxiolytic abuse, uncomplicated: Secondary | ICD-10-CM

## 2017-10-24 DIAGNOSIS — F1910 Other psychoactive substance abuse, uncomplicated: Secondary | ICD-10-CM

## 2017-10-24 DIAGNOSIS — O09211 Supervision of pregnancy with history of pre-term labor, first trimester: Secondary | ICD-10-CM

## 2017-10-24 MED ORDER — PROMETHAZINE HCL 25 MG PO TABS: 25.0000 mg | ORAL_TABLET | Freq: Four times a day (QID) | ORAL | 2 refills | Status: DC | PRN

## 2017-10-24 MED ORDER — DOXYLAMINE SUCCINATE (SLEEP) 25 MG PO TABS: 25.0000 mg | ORAL_TABLET | Freq: Four times a day (QID) | ORAL | 2 refills | Status: DC | PRN

## 2017-10-24 MED ORDER — PYRIDOXINE HCL 25 MG PO TABS: 25.0000 mg | ORAL_TABLET | Freq: Four times a day (QID) | ORAL | 3 refills | Status: DC | PRN

## 2017-10-24 NOTE — Telephone Encounter (Signed)
diclegis approved on [place] [address] today. Pt aware rx available at pharmacy. Pt also states that she would rx for phenergan called to pharmacy to have as back up bc nausea and vomiting occurs throughout the day and night and she is currently unable to keep anything down, including water and it also would help her to sleep. Please advise. Thank you.

## 2017-10-24 NOTE — Telephone Encounter (Signed)
Pt is calling about her Nausea medication not being covered by medicaid. Pt is requesting for another prescription to be sent in to her pharmacy. Please advise

## 2017-10-24 NOTE — Progress Notes (Signed)
Routine Prenatal Care Visit  Subjective  Meghan Bullock is a 26 y.o. [id number] at 8926w2d being seen today for ongoing prenatal care.  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  ---------------------------------------------------------------------------- ------- Patient reports nausea and vomiting. Dating scan today pt is currently 7 w 1 d. Pt declined to give urine specimen. Patient's UDS was positive for opiates, benzodiazepines and marijuana. She reports that she is using suboxone, but does not have a prescription for this medication. Contractions: Not present. Vag. Bleeding: None.   . Denies leaking of fluid.     Objective   Vitals:   10/24/17 1356  BP: 104/64  Weight: 156 lb (70.8 kg)   Pregravid Weight: 163 lb (73.9 kg)  Total Weight Gain:  (-3.175 kg)  Urinalysis:      Fetal Status: Fetal Heart Rate (bpm): present         General: Alert: Oriented and cooperative. Patient is in no acute distress. Skin: Skin is warm and dry. No rash noted.  Cardiovascular: Regular rate and rhythm. No murmurs, gallops, or rubs. Respiratory: Normal respiratory effort, no problems with respiration noted Abdomen: Soft, gravid, appropriate for gestational age. Pain/Pressure: Absent Pelvic: Cervical exam deferred       Extremeties: Normal range of motion.    Mental Status: Normal mood and affect. Normal behavior. Normal judgment and thought content.  Assessment   [age] y.o. [id number] at 2426w2d by  06/11/2018, by Ultrasound presenting for routine prenatal visit  Plan   PRegnancy#4 Problems (from 08/15/17 to present)     Problem Noted Resolved   Supervision of high risk pregnancy, antepartum 10/25/2017 by Natale MilchSchuman, Shaune Westfall R, MD No   Overview Signed 10/25/2017  2:02 PM by Natale MilchSchuman, Vaun Hyndman R, MD      Clinic [facility] Prenatal Labs  Dating  T1 US Blood type: O/Positive/-- (11/21 1511)   Genetic Screen 1 Screen:     AFP:      Quad:      NIPS:    Antibody:Negative (11/21 1511)  Anatomic US  Rubella: 5.22 (11/21 1511) Varicella: Immune  GTT Early:        28 wk:      RPR: Non Reactive (11/21 1511)   Rhogam  HBsAg: Negative (11/21 1511)   TDaP vaccine                       HIV:   Negative  Flu Shot                                GBS:   Contraception  Pap:  CBB     CS/VBAC    Baby Food    Support Person             Drug abuse of mother, first trimester (HCC) 10/25/2017 by Natale MilchSchuman, Dailin Sosnowski R, MD No   H/O preterm delivery, currently pregnant, first trimester 10/25/2017 by Natale MilchSchuman, Jordanne Elsbury R, MD No   Overview Signed 10/25/2017  2:52 PM by Natale MilchSchuman, Ettore Trebilcock R, MD    [ ]  Initiate 17-alpha hydroxyprogesterone at 16-[redacted] weeks gestational age [ ]  cervical length screening 16-24  weeks      Hepatitis C 10/23/2017 by Vena AustriaStaebler, Andreas, MD No   Overview Signed 10/23/2017  1:20 PM by Vena AustriaStaebler, Andreas, MD    [ ]  Baseline liver function tests [ ]  No amniotomy or fetal scalp electrode in labor if possible        Diclegis prior authorization completed.  Phenergan rectal suppositories, B12, and unisom prescribed.   Patient was seen by  Dorena DewMarilyn Steele, pregnancy care manager. Patient was resistant to assistance.  Please drawl liver function tests at next visit Please discuss options for first trimester screening at next visit.   Return in about 2 weeks (around 11/07/2017) for rob.  Adelene Idlerhristanna Cornelius Marullo M.D. 10/25/17  2:53 PM

## 2017-10-24 NOTE — Telephone Encounter (Signed)
I have already sent a phenergan script for her.

## 2017-10-25 DIAGNOSIS — O09892 Supervision of other high risk pregnancies, second trimester: Secondary | ICD-10-CM | POA: Insufficient documentation

## 2017-10-25 DIAGNOSIS — O99320 Drug use complicating pregnancy, unspecified trimester: Secondary | ICD-10-CM | POA: Insufficient documentation

## 2017-10-25 DIAGNOSIS — O09211 Supervision of pregnancy with history of pre-term labor, first trimester: Secondary | ICD-10-CM

## 2017-10-25 DIAGNOSIS — O0990 Supervision of high risk pregnancy, unspecified, unspecified trimester: Secondary | ICD-10-CM | POA: Insufficient documentation

## 2017-11-01 ENCOUNTER — Emergency Department
Admission: EM | Admit: 2017-11-01 | Discharge: 2017-11-01 | Disposition: A | Discharge status: Home / Self Care | Payer: Medicaid Other | Attending: Emergency Medicine | Admitting: Emergency Medicine

## 2017-11-01 ENCOUNTER — Encounter: Payer: Self-pay | Admitting: Emergency Medicine

## 2017-11-01 DIAGNOSIS — F17210 Nicotine dependence, cigarettes, uncomplicated: Secondary | ICD-10-CM | POA: Diagnosis not present

## 2017-11-01 DIAGNOSIS — Z4802 Encounter for removal of sutures: Secondary | ICD-10-CM

## 2017-11-01 DIAGNOSIS — X58XXXA Exposure to other specified factors, initial encounter: Secondary | ICD-10-CM | POA: Diagnosis not present

## 2017-11-01 DIAGNOSIS — T8130XA Disruption of wound, unspecified, initial encounter: Secondary | ICD-10-CM | POA: Diagnosis not present

## 2017-11-01 DIAGNOSIS — Y658 Other specified misadventures during surgical and medical care: Secondary | ICD-10-CM | POA: Diagnosis not present

## 2017-11-01 DIAGNOSIS — S81811D Laceration without foreign body, right lower leg, subsequent encounter: Secondary | ICD-10-CM | POA: Diagnosis not present

## 2017-11-01 NOTE — Discharge Instructions (Signed)
Continued cleaning area with mild soap and water. Watch for any signs of infection. Open wound will need to granulate which may take several weeks. [facility] any [facility] problems.

## 2017-11-01 NOTE — ED Provider Notes (Signed)
[facility] Emergency Department Provider Note  ____________________________________________   First MD Initiated Contact with Patient 11/01/17 1315     (approximate)  I have reviewed the triage vital signs and the nursing notes.   HISTORY  Chief Complaint Suture / Staple Removal   HPI [name] is a 26 y.o. female  Is here for both staple and suture removal. [facility]/17 for multiple lacerations to her right lower extremity. Patient denies any difficulties or fever. She has not seen any drainage.atient denies any pain.   Past Medical History:  Diagnosis Date  . Anxiety   . Bipolar 1 disorder (HCC)   . Chlamydia   . Heroin abuse (HCC)   . Mononucleosis   . Ovarian cyst   . Polysubstance abuse [facility](HCC)     Patient Active Problem List   Diagnosis Date Noted  . Supervision of high risk pregnancy, antepartum 10/25/2017  . 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 H/O preterm delivery, currently pregnant, first trimester 10/25/2017  . Hepatitis C 10/23/2017  . Preterm premature rupture of membranes (PPROM) with unknown onset of labor 05/12/2014  . Encephalopathy, toxic 10/03/2013  . Benzodiazepine abuse (HCC) 10/03/2013  . Heroin abuse [facility](HCC)     Past Surgical History:  Procedure Laterality Date  . NO PAST SURGERIES      Prior to Admission medications   Medication Sig Start Date End Date Taking? Authorizing Provider  acetaminophen (TYLENOL) 500 MG tablet Take 1 tablet (500 mg total) every 6 (six) hours as needed by mouth for mild pain or moderate pain. 10/09/17   Antony MaduraHumes, Kelly, PA-C  buprenorphine (SUBUTEX) 8 MG SUBL SL tablet Place 8 mg 2 (two) times daily under the tongue.    [provider]  doxylamine, Sleep, (UNISOM) 25 MG tablet Take 1 tablet (25 mg total) by mouth 4 (four) times daily as needed (nausea and vomiting). 10/24/17   Schuman, Jaquelyn Bitterhristanna R, MD  pyridOXINE (VITAMIN  B-6) 25 MG tablet Take 1 tablet (25 mg total) by mouth 4 (four) times daily as needed (nausea and vomiting). 10/24/17   Natale MilchSchuman, Christanna R, MD    Allergies Patient has no known allergies.  No family history on file.  Social History Social History   Tobacco Use  . Smoking status: Current Every Day Smoker    Packs/day: 1.00    Years: 5.00    Pack years: 5.00    Types: Cigarettes  . Smokeless tobacco: Never Used  Substance Use Topics  . Alcohol use: No  . Drug use: Yes    Types: IV, Heroin    Comment: Smokes marijuana twice a week; heroin  LAST USED MARIJUANA- FEB,  LAST TIME USED HEROIN  2-6.-2015    Review of Systems Constitutional: No fever/chills Cardiovascular: Denies chest pain. Respiratory: Denies shortness of breath. Skin: healing laceration. Neurological: Negative for  focal weakness or numbness. ____________________________________________   PHYSICAL EXAM:  VITAL SIGNS: ED Triage Vitals [11/01/17 1248]  Enc Vitals Group     BP 130/80     Pulse Rate 89     Resp 16     Temp 98.8 F (37.1 C)     Temp Source Oral     SpO2 100 %     Weight 155 lb (70.3 kg)     Height 5\' 4"  (1.626 m)     Head Circumference      Peak Flow      Pain Score  0     Pain Loc      Pain Edu?      Excl. in GC?    Constitutional: Alert and oriented. Well appearing and in no acute distress. Eyes: Conjunctivae are normal.  Head: Atraumatic. Neck: No stridor.   Cardiovascular: Normal rate, regular rhythm. Grossly normal heart sounds.  Good peripheral circulation. Respiratory: Normal respiratory effort.  No retractions. Lungs CTAB. Musculoskeletal: moves lower extremity without any difficulty. Motor sensory function intact. Neurologic:  Normal speech and language. No gross focal neurologic deficits are appreciated. No gait instability. Skin:  Skin is warm, dry.  There is healing without any evidence of infection. One of the sutured areas more distal than other lacerations has  dehisced.   Psychiatric: Mood and affect are normal. Speech and behavior are normal.  ____________________________________________   LABS (all labs ordered are listed, but only abnormal results are displayed)  Labs Reviewed - No data to display   PROCEDURES  Procedure(s) performed: None  Procedures  Critical Care performed: No  ____________________________________________   INITIAL IMPRESSION / ASSESSMENT AND PLAN / ED COURSE  Patient is wears she needs to watch the wound for any infection and continue to clean with mild soap and water.  ____________________________________________   FINAL CLINICAL IMPRESSION(S) / ED DIAGNOSES  Final diagnoses:  Encounter for removal of sutures  Encounter for staple removal  Wound dehiscence     ED Discharge Orders    None       Note:  This document was prepared using Dragon voice recognition software and may include unintentional dictation errors.    Tommi RumpsSummers, Rhonda L, PA-C 11/01/17 1331    Sharman CheekStafford, Phillip, MD 11/01/17 1520

## 2017-11-01 NOTE — ED Triage Notes (Signed)
Pt to ED via POV for stuture removal to RT lower leg, 12 stiches and 11 staples. Wounds are clean and dry , no indication of infection.

## 2017-11-08 ENCOUNTER — Encounter: Payer: Self-pay | Admitting: Advanced Practice Midwife

## 2017-11-08 ENCOUNTER — Ambulatory Visit (INDEPENDENT_AMBULATORY_CARE_PROVIDER_SITE_OTHER): Payer: Medicaid Other | Admitting: Advanced Practice Midwife

## 2017-11-08 VITALS — BP 120/70 | Wt 155.0 lb

## 2017-11-08 DIAGNOSIS — Z3A09 9 weeks gestation of pregnancy: Secondary | ICD-10-CM

## 2017-11-08 NOTE — Progress Notes (Signed)
Routine Prenatal Care Visit  Subjective  Meghan Bullock is a 26 y.o. [id number] at 4155w2d being seen today for ongoing prenatal care.  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  ----------------------------------------------------------------------------------- Patient reports no complaints.  Patient has Rx for Subutex. Phenergan is helping nausea. She declines First trimester screen Contractions: Not present. Vag. Bleeding: None.   . Denies leaking of fluid.  ----------------------------------------------------------------------------------- The following portions of the patient's history were reviewed and updated as appropriate: allergies, current medications, past family history, past medical history, past social history, past surgical history and problem list. Problem list updated.   Objective  Blood pressure 120/70, weight 155 lb (70.3 kg), last menstrual period 08/15/2017. Pregravid weight 163 lb (73.9 kg) Total Weight Gain  (-3.629 kg) Urinalysis: Urine Protein: Negative Urine Glucose: Negative  Fetal Status:           General:  Alert, oriented and cooperative. Patient is in no acute distress.  Skin: Skin is warm and dry. No rash noted.   Cardiovascular: Normal heart rate noted  Respiratory: Normal respiratory effort, no problems with respiration noted  Abdomen: Soft, gravid, appropriate for gestational age. Pain/Pressure: Absent     Pelvic:  Cervical exam deferred        Extremities: Normal range of motion.     Mental Status: Normal mood and affect. Normal behavior. Normal judgment and thought content.     Assessment   26 y.o. [id number] at 4355w2d by  06/11/2018, by Ultrasound presenting for routine prenatal visit  Plan   PRegnancy#4 Problems (from 08/15/17 to present)    Problem Noted Resolved   Supervision of high risk pregnancy, antepartum 10/25/2017 by Natale MilchSchuman, Christanna R, MD No   Overview Addendum 10/26/2017  7:15 PM by Vena AustriaStaebler, Andreas, MD      Clinic [facility] Prenatal Labs  Dating  T1 US Blood type: O/Positive/-- (11/21 1511)   Genetic Screen 1 Screen:     AFP:      Quad:      NIPS:    Antibody:Negative (11/21 1511)  Anatomic US  Rubella: 5.22 (11/21 1511) Varicella: Immune  GTT 28 wk:      RPR: Non Reactive (11/21 1511)   Rhogam N/A HBsAg: Negative (11/21 1511)   TDaP vaccine                       HIV:   Negative  Flu Shot                                GBS:   Contraception  Pap: NIL 10/18/2017  CBB     CS/VBAC    Baby Food    Support Person             Drug abuse of mother, first trimester (HCC) 10/25/2017 by Natale MilchSchuman, Christanna R, MD No   H/O preterm delivery, currently pregnant, first trimester 10/25/2017 by Natale MilchSchuman, Christanna R, MD No   Overview Signed 10/25/2017  2:52 PM by Natale MilchSchuman, Christanna R, MD    [ ]  Initiate 17-alpha hydroxyprogesterone at 16-[redacted] weeks gestational age [ ]  cervical length screening 16-24 weeks  Hepatitis C 10/23/2017 by Vena AustriaStaebler, Andreas, MD No   Overview Signed 10/23/2017  1:20 PM by Vena AustriaStaebler, Andreas, MD    [ ]  Baseline liver function tests [ ]  No amniotomy or fetal scalp electrode in labor if possible          Preterm labor symptoms and general obstetric precautions including but not limited to vaginal bleeding, contractions, leaking of fluid and fetal movement were reviewed in detail with the patient. Please refer to After Visit Summary for other counseling recommendations.   Return in about 4 weeks (around 12/06/2017) for rob.  Tresea MallJane Tyniesha Howald, CNM  11/08/2017 2:19 PM

## 2017-11-28 NOTE — L&D Delivery Note (Signed)
Delivery Note Primary OB: [facility] Delivery Provider: Marcelyn BruinsJacelyn Schmid, CNM Gestational Age: Full term Antepartum complications: Substance abuse, Hepatitis C Intrapartum complications: None  A viable female was delivered via vertex presentation. No nuchal cord was present. Delivery of the shoulders and body followed without difficulty. The infant was placed on the maternal abdomen. The umbilical cord was doubly clamped and cut following delayed cord clamping. Cord blood was collected. The placenta was delivered spontaneously and was inspected and found to be intact with a three vessel cord. The cervix and vagina were inspected. There were no lacerations. The fundus was firm. Patient and infant were bonding in stable condition. All counts were correct.  Apgars: 8, 9  Weight:  8 lb 3 oz.   Placenta status: spontaneous and Intact.  Cord: 3 vessels.  Anesthesia:  epidural Episiotomy:  none Lacerations:  none Suture Repair: none Est. Blood Loss (mL):  400  Mom to postpartum.  Baby to Couplet care / Skin to Skin.  Marcelyn BruinsJacelyn Schmid, CNM [facility] 06/15/2018  3:51 PM

## 2017-12-06 ENCOUNTER — Ambulatory Visit (INDEPENDENT_AMBULATORY_CARE_PROVIDER_SITE_OTHER): Payer: Medicaid Other | Admitting: Obstetrics and Gynecology

## 2017-12-06 VITALS — BP 112/62 | Wt 153.0 lb

## 2017-12-06 DIAGNOSIS — O09891 Supervision of other high risk pregnancies, first trimester: Secondary | ICD-10-CM

## 2017-12-06 DIAGNOSIS — O99011 Anemia complicating pregnancy, first trimester: Secondary | ICD-10-CM

## 2017-12-06 DIAGNOSIS — Z3A13 13 weeks gestation of pregnancy: Secondary | ICD-10-CM

## 2017-12-06 DIAGNOSIS — O99321 Drug use complicating pregnancy, first trimester: Secondary | ICD-10-CM

## 2017-12-06 DIAGNOSIS — F1910 Other psychoactive substance abuse, uncomplicated: Secondary | ICD-10-CM

## 2017-12-06 DIAGNOSIS — O09211 Supervision of pregnancy with history of pre-term labor, first trimester: Secondary | ICD-10-CM

## 2017-12-06 DIAGNOSIS — F1310 Sedative, hypnotic or anxiolytic abuse, uncomplicated: Secondary | ICD-10-CM

## 2017-12-06 DIAGNOSIS — B1920 Unspecified viral hepatitis C without hepatic coma: Secondary | ICD-10-CM

## 2017-12-06 DIAGNOSIS — O0990 Supervision of high risk pregnancy, unspecified, unspecified trimester: Secondary | ICD-10-CM

## 2017-12-06 MED ORDER — PROMETHAZINE HCL 25 MG PO TABS: 25.0000 mg | ORAL_TABLET | Freq: Four times a day (QID) | ORAL | 2 refills | Status: DC | PRN

## 2017-12-06 MED ORDER — PRENATAL VITAMINS 0.8 MG PO TABS: 1.0000 | ORAL_TABLET | Freq: Every day | ORAL | 12 refills | Status: DC

## 2017-12-06 MED ORDER — FERROUS SULFATE 325 (65 FE) MG PO TABS: 325.0000 mg | ORAL_TABLET | Freq: Two times a day (BID) | ORAL | 1 refills | Status: DC

## 2017-12-06 MED ORDER — DOCUSATE SODIUM 100 MG PO CAPS: 100.0000 mg | ORAL_CAPSULE | Freq: Two times a day (BID) | ORAL | 2 refills | Status: DC | PRN

## 2017-12-06 MED ORDER — ASCORBIC ACID 100 MG PO TABS: 100.0000 mg | ORAL_TABLET | Freq: Two times a day (BID) | ORAL | 11 refills | Status: DC

## 2017-12-06 NOTE — Progress Notes (Signed)
Routine Prenatal Care Visit  Subjective  Meghan Bullock is a 27 y.o. [phone] at 42w3dbeing seen today for ongoing prenatal care.  She is currently monitored for the following issues for this high-risk pregnancy and has [place]in [place] ([place]; Encephalopathy, toxic; Benzodiazepine abuse ([place]; Preterm premature rupture of membranes (PPROM) with unknown onset of labor; Hepatitis C; Supervision of high risk pregnancy, antepartum; Drug abuse of mother, first trimester ([place]; and H/O preterm delivery, currently pregnant, first trimester on their problem list.  ----------------------------------------------------------------------------------- Pt c/o sharp pains on lower left side that started today. She requests a refill of promethazine and prenatal vitamins. Met with marilyn today at visit.  Contractions: Not present. Vag. Bleeding: None.   . Denies leaking of fluid.  ----------------------------------------------------------------------------------- The following portions of the patient's history were reviewed and updated as appropriate: allergies, current medications, past family history, past medical history, past social history, past surgical history and problem list. Problem list updated.   Objective  Blood pressure 112/62, weight 153 lb (69.4 kg), last menstrual period 08/15/2017. Pregravid weight 163 lb (73.9 kg) Total Weight Gain  (-4.536 kg) Urinalysis:      Fetal Status: Fetal Heart Rate (bpm): 143         General:  Alert, oriented and cooperative. Patient is in no acute distress.  Skin: Skin is warm and dry. No rash noted.   Cardiovascular: Normal heart rate noted  Respiratory: Normal respiratory effort, no problems with respiration noted  Abdomen: Soft, gravid, appropriate for gestational age. Pain/Pressure: Absent     Pelvic:  Cervical exam deferred        Extremities: Normal range of motion.     ental Status: Normal mood and affect. Normal behavior. Normal judgment and  thought content.     Assessment   27y.o. [id number] 119w3dy  06/11/2018, by Ultrasound presenting for routine prenatal visit  Plan   PRegnancy#4 Problems (from 08/15/17 to present)    Problem Noted Resolved   Supervision of high risk pregnancy, antepartum 10/25/2017 by ScHomero FellersMD No   Overview Addendum 10/26/2017  7:15 PM by StMalachy MoodMD      Clinic [facility] Prenatal Labs  Dating  T1 USKorealood type: O/Positive/-- (11/21 1511)   Genetic Screen 1 Screen:     AFP:      Quad:      NIPS:    Antibody:Negative (11/21 1511)  Anatomic USKoreaRubella: 5.22 (11/21 1511) Varicella: Immune  GTT 28 wk:      RPR: Non Reactive (11/21 1511)   Rhogam N/A HBsAg: Negative (11/21 1511)   TDaP vaccine                       HIV:   Negative  Flu Shot                                GBS:   Contraception  Pap: NIL 10/18/2017  CBB     CS/VBAC    Baby Food    Support Person             Drug abuse of mother, first trimester ([place]/2018 by ScHomero FellersMD No   H/O preterm delivery, currently pregnant, first trimester 10/25/2017 by ScHomero FellersMD No   Overview Signed 10/25/2017  2:52 PM by ScHomero FellersMD    [ ]  Initiate 17-alpha hydroxyprogesterone  at 16-[redacted] weeks gestational age [ ]  cervical length screening 16-24 weeks      Hepatitis C 10/23/2017 by Malachy Mood, MD No   Overview Signed 10/23/2017  1:20 PM by Malachy Mood, MD    [ ]  Baseline liver function tests [ ]  No amniotomy or fetal scalp electrode in labor if possible          Preterm labor symptoms and general obstetric precautions including but not limited to vaginal bleeding, contractions, leaking of fluid and fetal movement were reviewed in detail with the patient. Please refer to After Visit Summary for other counseling recommendations.   Refills of medications sent Hx of preterm delivery- start transvaginal US and Makena Injections at 16 weeks Hepatitis C-  Check CMP for liver function  Will start ferrous sulfate and vitamin c for anemia  **Patient left before lab work could be completed anddid not schedule follow up.**  Return in about 3 weeks (around 12/27/2017) for [place] and [place].   Adrian Prows MD  [facility] 12/07/17 6:46 PM

## 2017-12-07 MED ORDER — HYDROXYPROGESTERONE CAPROATE 275 MG/1.1ML ~~LOC~~ SOAJ: 275.0000 mg | Freq: Once | SUBCUTANEOUS | Status: DC

## 2017-12-12 ENCOUNTER — Other Ambulatory Visit: Payer: Self-pay | Admitting: Obstetrics and Gynecology

## 2017-12-12 ENCOUNTER — Telehealth: Payer: Self-pay | Admitting: Obstetrics and Gynecology

## 2017-12-12 ENCOUNTER — Other Ambulatory Visit: Payer: Self-pay

## 2017-12-12 MED ORDER — DOXYLAMINE-PYRIDOXINE 10-10 MG PO TBEC: 2.0000 | DELAYED_RELEASE_TABLET | Freq: Every day | ORAL | 5 refills | Status: DC

## 2017-12-12 NOTE — Telephone Encounter (Signed)
Please advise 

## 2017-12-12 NOTE — Telephone Encounter (Signed)
Diclegis sent [facility]er[facility]

## 2017-12-12 NOTE — Telephone Encounter (Signed)
Pt needing to get a refill on her Diclegis and her promethazine meds. Would like to have meds called to [facility] in [place].   Cb# [phone]

## 2017-12-27 ENCOUNTER — Ambulatory Visit (INDEPENDENT_AMBULATORY_CARE_PROVIDER_SITE_OTHER): Payer: Medicaid Other

## 2017-12-27 ENCOUNTER — Ambulatory Visit: Payer: Medicaid Other | Admitting: Obstetrics & Gynecology

## 2017-12-27 DIAGNOSIS — Z3A16 16 weeks gestation of pregnancy: Secondary | ICD-10-CM | POA: Diagnosis not present

## 2017-12-27 DIAGNOSIS — Z362 Encounter for other antenatal screening follow-up: Secondary | ICD-10-CM | POA: Diagnosis not present

## 2017-12-27 DIAGNOSIS — O09212 Supervision of pregnancy with history of pre-term labor, second trimester: Secondary | ICD-10-CM

## 2017-12-27 DIAGNOSIS — O09891 Supervision of other high risk pregnancies, first trimester: Secondary | ICD-10-CM

## 2017-12-27 DIAGNOSIS — O09211 Supervision of pregnancy with history of pre-term labor, first trimester: Principal | ICD-10-CM

## 2018-03-05 ENCOUNTER — Telehealth: Payer: Self-pay

## 2018-03-05 NOTE — Telephone Encounter (Signed)
CM attempted a contact with member.  Father of baby answered call and informed this cm that member is currently incarcerated, however will be released on 04/11/2018.  CM encouraged father of baby to have member schedule an appointment as soon as she is released from prison.  Father of baby agreeable.

## 2018-04-16 ENCOUNTER — Ambulatory Visit (INDEPENDENT_AMBULATORY_CARE_PROVIDER_SITE_OTHER): Payer: Medicaid Other | Admitting: Obstetrics and Gynecology

## 2018-04-16 ENCOUNTER — Encounter: Payer: Self-pay | Admitting: Obstetrics and Gynecology

## 2018-04-16 VITALS — BP 112/58 | Wt 155.0 lb

## 2018-04-16 DIAGNOSIS — F1310 Sedative, hypnotic or anxiolytic abuse, uncomplicated: Secondary | ICD-10-CM

## 2018-04-16 DIAGNOSIS — Z3A32 32 weeks gestation of pregnancy: Secondary | ICD-10-CM

## 2018-04-16 DIAGNOSIS — Z23 Encounter for immunization: Secondary | ICD-10-CM

## 2018-04-16 DIAGNOSIS — O99321 Drug use complicating pregnancy, first trimester: Secondary | ICD-10-CM

## 2018-04-16 DIAGNOSIS — O99019 Anemia complicating pregnancy, unspecified trimester: Secondary | ICD-10-CM

## 2018-04-16 DIAGNOSIS — O09891 Supervision of other high risk pregnancies, first trimester: Secondary | ICD-10-CM

## 2018-04-16 DIAGNOSIS — O09211 Supervision of pregnancy with history of pre-term labor, first trimester: Secondary | ICD-10-CM

## 2018-04-16 DIAGNOSIS — F1110 Opioid abuse, uncomplicated: Secondary | ICD-10-CM

## 2018-04-16 DIAGNOSIS — F1910 Other psychoactive substance abuse, uncomplicated: Secondary | ICD-10-CM

## 2018-04-16 DIAGNOSIS — O0990 Supervision of high risk pregnancy, unspecified, unspecified trimester: Secondary | ICD-10-CM

## 2018-04-16 DIAGNOSIS — B182 Chronic viral hepatitis C: Secondary | ICD-10-CM

## 2018-04-16 NOTE — Progress Notes (Signed)
Routine Prenatal Care Visit  Subjective  Meghan Bullock is a 27 y.o. [phone] at [redacted]w[redacted]d being seen today for ongoing prenatal care.  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  ----------------------------------------------------------------------------------- Patient reports no complaints. Patient has been in prison for 3 months. She had a normal anatomy scan at [facility] on 03/14/18. Contractions: Not present. Vag. Bleeding: None.   . Denies leaking of fluid.  ----------------------------------------------------------------------------------- The following portions of the patient's history were reviewed and updated as appropriate: allergies, current medications, past family history, past medical history, past social history, past surgical history and problem list. Problem list updated.   Objective  Blood pressure (!) 112/58, weight 155 lb (70.3 kg), last menstrual period 08/15/2017. Pregravid weight 163 lb (73.9 kg) Total Weight Gain -8 lb (-3.629 kg) Urinalysis: Urine Protein: Negative Urine Glucose: Negative  Fetal Status: Fetal Heart Rate (bpm): 147         General:  Alert, oriented and cooperative. Patient is in no acute distress.  Skin: Skin is warm and dry. No rash noted.   Cardiovascular: Normal heart rate noted  Respiratory: Normal respiratory effort, no problems with respiration noted  Abdomen: Soft, gravid, appropriate for gestational age. Pain/Pressure: Absent     Pelvic:  Cervical exam deferred        Extremities: Normal range of motion.     ental Status: Normal mood and affect. Normal behavior.  Normal judgment and thought content.     Assessment   27 y.o. [id number] at [redacted]w[redacted]d by  06/11/2018, by Ultrasound presenting for routine prenatal visit  Plan   PRegnancy#4 Problems (from 08/15/17 to present)    Problem Noted Resolved   Supervision of high risk pregnancy, antepartum 10/25/2017 by Natale Milch, MD No   Overview Addendum 04/16/2018 11:19 AM by Natale Milch, MD      Clinic [facility] Prenatal Labs  Dating  T1 [place] Blood type: O/Positive/-- (11/21 1511)   Genetic Screen 1 Screen:     AFP:      Quad:      NIPS:    Antibody:Negative (11/21 1511)  Anatomic [place]  Rubella: 5.22 (11/21 1511) Varicella: Immune  GTT 28 wk:      RPR: Non Reactive (11/21 1511)   Rhogam N/A HBsAg: Negative (11/21 1511)   TDaP vaccine                       HIV: Non Reactive (11/21 1511) Negative  Flu Shot                                GBS:   Contraception  Pap: NIL 10/18/2017  CBB     CS/VBAC  not applicable   Baby Food    Support Person             Drug abuse of mother, first trimester (HCC) 10/25/2017 by Natale Milch, MD No   H/O preterm delivery, currently pregnant, first trimester 10/25/2017 by Natale Milch, MD No   Overview Signed 10/25/2017  2:52 PM by Natale Milch, MD     Initiate  17-alpha hydroxyprogesterone at 16-[redacted] weeks gestational age  cervical length screening 16-24 weeks      Hepatitis C 10/23/2017 by Vena Austria, MD No   Overview Signed 10/23/2017  1:20 PM by Vena Austria, MD     Baseline liver function tests  No amniotomy or fetal scalp electrode in labor if possible          Gestational age appropriate obstetric precautions including but not limited to vaginal bleeding, contractions, leaking of fluid and fetal movement were reviewed in detail with the patient.    Return in about 2 weeks (around 04/30/2018) for ROB.  TDAP today Labs for anemia today, patient has not been taking oral iron because of  constipation issues, will refer to hematology for IV iron transfusion pending results of labs.  28 week panel this week with one hour GTT, patient states this was not done while she was in prison.  Dorena Dew contacted for pregnancy support related to social needs.  UDS today.   Adelene Idler MD [facility], [facility] 04/16/18 11:26 AM

## 2018-04-16 NOTE — Progress Notes (Signed)
Pt reports no problems. Awaiting records from previous provider. Requests rx for PNV capsule and promethazine for nausea.

## 2018-04-17 LAB — IRON AND TIBC
IRON: 21 ug/dL — AB (ref 27–159)
Iron Saturation: 4 % — CL (ref 15–55)
Total Iron Binding Capacity: 563 ug/dL (ref 250–450)
UIBC: 542 ug/dL — AB (ref 131–425)

## 2018-04-17 LAB — CBC
Hematocrit: 28.3 % — ABNORMAL LOW (ref 34.0–46.6)
Hemoglobin: 8.6 g/dL — ABNORMAL LOW (ref 11.1–15.9)
MCH: 22.7 pg — ABNORMAL LOW (ref 26.6–33.0)
MCHC: 30.4 g/dL — ABNORMAL LOW (ref 31.5–35.7)
MCV: 75 fL — AB (ref 79–97)
PLATELETS: 237 10*3/uL (ref 150–450)
RBC: 3.79 x10E6/uL (ref 3.77–5.28)
RDW: 16.2 % — AB (ref 12.3–15.4)
WBC: 10.2 10*3/uL (ref 3.4–10.8)

## 2018-04-17 LAB — FERRITIN: Ferritin: 5 ng/mL — ABNORMAL LOW (ref 15–150)

## 2018-04-17 LAB — URINE DRUG PANEL 7
Amphetamines, Urine: NEGATIVE ng/mL
BARBITURATE QUANT UR: NEGATIVE ng/mL
Benzodiazepine Quant, Ur: NEGATIVE ng/mL
Cannabinoid Quant, Ur: NEGATIVE ng/mL
Cocaine (Metab.): NEGATIVE ng/mL
Opiate Quant, Ur: NEGATIVE ng/mL
PCP Quant, Ur: NEGATIVE ng/mL

## 2018-04-17 LAB — VITAMIN B12: Vitamin B-12: 174 pg/mL — ABNORMAL LOW (ref 232–1245)

## 2018-04-17 LAB — FOLATE: Folate: 17.6 ng/mL (ref >3.0)

## 2018-04-20 ENCOUNTER — Inpatient Hospital Stay: Payer: Medicaid Other | Attending: Oncology | Admitting: Oncology

## 2018-04-20 NOTE — Progress Notes (Signed)
Discussed results with patient on the phone. Encouraged her to attend hematology visit. Severe anemia.

## 2018-04-24 ENCOUNTER — Telehealth: Payer: Self-pay

## 2018-04-24 NOTE — Telephone Encounter (Signed)
Pt is calling in regards to her iron and B12 levels being so low, Pt stated "I am so weak and nauseas" she wanted to know it there was somewhere she could go sooner because [facility] until Next Monday 04/30/18

## 2018-04-24 NOTE — Telephone Encounter (Signed)
I unfortunatly do not know of a place that she can be seen sooner. Perhaps nancy has an idea?  Thank you, Dr. Jerene Pitch

## 2018-04-25 NOTE — Telephone Encounter (Signed)
Thank you :)

## 2018-04-25 NOTE — Telephone Encounter (Signed)
I called again and was able to speak to Lafonda Mosses, who transferred me to [employer] (New Patient Coordinator - Hematology), where I left a detailed message requesting an earlier appointment for the patient.

## 2018-04-26 NOTE — Telephone Encounter (Signed)
I called again and left a detailed message for Lafonda Mosses, as I have not heard back from [place], and Lafonda Mosses wasn't sure whether she should give Melissa's phone# when we spoke yesterday.

## 2018-04-27 NOTE — Telephone Encounter (Signed)
Thanks for working hard on that [employer]. I appreciate it. -Dr. Jerene PitchSchuman

## 2018-04-30 ENCOUNTER — Encounter: Payer: Self-pay | Admitting: Oncology

## 2018-04-30 ENCOUNTER — Inpatient Hospital Stay: Payer: Medicaid Other

## 2018-04-30 ENCOUNTER — Inpatient Hospital Stay: Payer: Medicaid Other | Attending: Oncology | Admitting: Oncology

## 2018-04-30 VITALS — BP 108/75 | HR 83 | Temp 97.9°F | Resp 18 | Ht 64.0 in | Wt 153.4 lb

## 2018-04-30 DIAGNOSIS — E538 Deficiency of other specified B group vitamins: Secondary | ICD-10-CM | POA: Insufficient documentation

## 2018-04-30 DIAGNOSIS — K5909 Other constipation: Secondary | ICD-10-CM | POA: Diagnosis not present

## 2018-04-30 DIAGNOSIS — O99013 Anemia complicating pregnancy, third trimester: Secondary | ICD-10-CM | POA: Diagnosis present

## 2018-04-30 DIAGNOSIS — D508 Other iron deficiency anemias: Secondary | ICD-10-CM | POA: Diagnosis not present

## 2018-04-30 DIAGNOSIS — D509 Iron deficiency anemia, unspecified: Secondary | ICD-10-CM | POA: Insufficient documentation

## 2018-04-30 LAB — CBC WITH DIFFERENTIAL/PLATELET
Basophils Absolute: 0 10*3/uL (ref 0–0.1)
Basophils Relative: 0 %
EOS PCT: 1 %
Eosinophils Absolute: 0.1 10*3/uL (ref 0–0.7)
HCT: 27.2 % — ABNORMAL LOW (ref 35.0–47.0)
Hemoglobin: 8.6 g/dL — ABNORMAL LOW (ref 12.0–16.0)
LYMPHS PCT: 18 %
Lymphs Abs: 1.5 10*3/uL (ref 1.0–3.6)
MCH: 22.1 pg — AB (ref 26.0–34.0)
MCHC: 31.8 g/dL — AB (ref 32.0–36.0)
MCV: 69.5 fL — AB (ref 80.0–100.0)
MONO ABS: 0.6 10*3/uL (ref 0.2–0.9)
Monocytes Relative: 7 %
Neutro Abs: 6.1 10*3/uL (ref 1.4–6.5)
Neutrophils Relative %: 74 %
PLATELETS: 221 10*3/uL (ref 150–440)
RBC: 3.91 MIL/uL (ref 3.80–5.20)
RDW: 17 % — AB (ref 11.5–14.5)
WBC: 8.3 10*3/uL (ref 3.6–11.0)

## 2018-04-30 MED ORDER — HEPARIN SOD (PORK) LOCK FLUSH 100 UNIT/ML IV SOLN: 250.0000 [IU] | Freq: Once | INTRAVENOUS | Status: DC | PRN

## 2018-04-30 MED ORDER — CYANOCOBALAMIN 1000 MCG/ML IJ SOLN
1000.0000 ug | INTRAMUSCULAR | Status: DC
  Administered 2018-04-30: 1000 ug via INTRAMUSCULAR

## 2018-04-30 MED ORDER — HEPARIN SOD (PORK) LOCK FLUSH 100 UNIT/ML IV SOLN: 500.0000 [IU] | Freq: Once | INTRAVENOUS | Status: DC | PRN

## 2018-04-30 MED ORDER — SODIUM CHLORIDE 0.9% FLUSH
3.0000 mL | Freq: Once | INTRAVENOUS | Status: DC | PRN
  Filled 2018-04-30: qty 3

## 2018-04-30 MED ORDER — SODIUM CHLORIDE 0.9 % IV SOLN
Freq: Once | INTRAVENOUS | Status: AC
  Administered 2018-04-30: 14:00:00 via INTRAVENOUS
  Filled 2018-04-30: qty 1000

## 2018-04-30 MED ORDER — ALTEPLASE 2 MG IJ SOLR: 2.0000 mg | Freq: Once | INTRAMUSCULAR | Status: DC | PRN

## 2018-04-30 MED ORDER — SODIUM CHLORIDE 0.9% FLUSH
10.0000 mL | INTRAVENOUS | Status: DC | PRN
  Filled 2018-04-30: qty 10

## 2018-04-30 MED ORDER — IRON SUCROSE 20 MG/ML IV SOLN
200.0000 mg | INTRAVENOUS | Status: DC
  Administered 2018-04-30: 200 mg via INTRAVENOUS
  Filled 2018-04-30: qty 10

## 2018-04-30 NOTE — Patient Instructions (Addendum)
Iron Sucrose injection What is this medicine? IRON SUCROSE (AHY ern SOO krohs) is an iron complex. Iron is used to make healthy red blood cells, which carry oxygen and nutrients throughout the body. This medicine is used to treat iron deficiency anemia in people with chronic kidney disease. This medicine may be used for other purposes; ask your health care provider or pharmacist if you have questions. COMMON BRAND NAME(S): Venofer What should I tell my health care provider before I take this medicine? They need to know if you have any of these conditions: -anemia not caused by low iron levels -heart disease -high levels of iron in the blood -kidney disease -liver disease -an unusual or allergic reaction to iron, other medicines, foods, dyes, or preservatives -pregnant or trying to get pregnant -breast-feeding How should I use this medicine? This medicine is for infusion into a vein. It is given by a health care professional in a hospital or clinic setting. Talk to your pediatrician regarding the use of this medicine in children. While this drug may be prescribed for children as young as 2 years for selected conditions, precautions do apply. Overdosage: If you think you have taken too much of this medicine contact a poison control center or emergency room at once. NOTE: This medicine is only for you. Do not share this medicine with others. What if I miss a dose? It is important not to miss your dose. Call your doctor or health care professional if you are unable to keep an appointment. What may interact with this medicine? Do not take this medicine with any of the following medications: -deferoxamine -dimercaprol -other iron products This medicine may also interact with the following medications: -chloramphenicol -deferasirox This list may not describe all possible interactions. Give your health care provider a list of all the medicines, herbs, non-prescription drugs, or dietary  supplements you use. Also tell them if you smoke, drink alcohol, or use illegal drugs. Some items may interact with your medicine. What should I watch for while using this medicine? Visit your doctor or healthcare professional regularly. Tell your doctor or healthcare professional if your symptoms do not start to get better or if they get worse. You may need blood work done while you are taking this medicine. You may need to follow a special diet. Talk to your doctor. Foods that contain iron include: whole grains/cereals, dried fruits, beans, or peas, leafy green vegetables, and organ meats (liver, kidney). What side effects may I notice from receiving this medicine? Side effects that you should report to your doctor or health care professional as soon as possible: -allergic reactions like skin rash, itching or hives, swelling of the face, lips, or tongue -breathing problems -changes in blood pressure -cough -fast, irregular heartbeat -feeling faint or lightheaded, falls -fever or chills -flushing, sweating, or hot feelings -joint or muscle aches/pains -seizures -swelling of the ankles or feet -unusually weak or tired Side effects that usually do not require medical attention (report to your doctor or health care professional if they continue or are bothersome): -diarrhea -feeling achy -headache -irritation at site where injected -nausea, vomiting -stomach upset -tiredness This list may not describe all possible side effects. Call your doctor for medical advice about side effects. You may report side effects to FDA at 1-800-FDA-1088. Where should I keep my medicine? This drug is given in a hospital or clinic and will not be stored at home. NOTE: This sheet is a summary. It may not cover all possible information. If   you have questions about this medicine, talk to your doctor, pharmacist, or health care provider.  2018 Elsevier/Gold Standard (2011-08-25 17:14:35) Cyanocobalamin, Vitamin  B12 injection What is this medicine? CYANOCOBALAMIN (sye an oh koe BAL a min) is a man made form of vitamin B12. Vitamin B12 is used in the growth of healthy blood cells, nerve cells, and proteins in the body. It also helps with the metabolism of fats and carbohydrates. This medicine is used to treat people who can not absorb vitamin B12. This medicine may be used for other purposes; ask your health care provider or pharmacist if you have questions. COMMON BRAND NAME(S): B-12 Compliance Kit, B-12 Injection Kit, Cyomin, LA-12, Nutri-Twelve, Physicians EZ Use B-12, Primabalt What should I tell my health care provider before I take this medicine? They need to know if you have any of these conditions: -kidney disease -Leber's disease -megaloblastic anemia -an unusual or allergic reaction to cyanocobalamin, cobalt, other medicines, foods, dyes, or preservatives -pregnant or trying to get pregnant -breast-feeding How should I use this medicine? This medicine is injected into a muscle or deeply under the skin. It is usually given by a health care professional in a clinic or doctor's office. However, your doctor may teach you how to inject yourself. Follow all instructions. Talk to your pediatrician regarding the use of this medicine in children. Special care may be needed. Overdosage: If you think you have taken too much of this medicine contact a poison control center or emergency room at once. NOTE: This medicine is only for you. Do not share this medicine with others. What if I miss a dose? If you are given your dose at a clinic or doctor's office, call to reschedule your appointment. If you give your own injections and you miss a dose, take it as soon as you can. If it is almost time for your next dose, take only that dose. Do not take double or extra doses. What may interact with this medicine? -colchicine -heavy alcohol intake This list may not describe all possible interactions. Give your  health care provider a list of all the medicines, herbs, non-prescription drugs, or dietary supplements you use. Also tell them if you smoke, drink alcohol, or use illegal drugs. Some items may interact with your medicine. What should I watch for while using this medicine? Visit your doctor or health care professional regularly. You may need blood work done while you are taking this medicine. You may need to follow a special diet. Talk to your doctor. Limit your alcohol intake and avoid smoking to get the best benefit. What side effects may I notice from receiving this medicine? Side effects that you should report to your doctor or health care professional as soon as possible: -allergic reactions like skin rash, itching or hives, swelling of the face, lips, or tongue -blue tint to skin -chest tightness, pain -difficulty breathing, wheezing -dizziness -red, swollen painful area on the leg Side effects that usually do not require medical attention (report to your doctor or health care professional if they continue or are bothersome): -diarrhea -headache This list may not describe all possible side effects. Call your doctor for medical advice about side effects. You may report side effects to FDA at 1-800-FDA-1088. Where should I keep my medicine? Keep out of the reach of children. Store at room temperature between 15 and 30 degrees C (59 and 85 degrees F). Protect from light. Throw away any unused medicine after the expiration date. NOTE: This sheet is  a summary. It may not cover all possible information. If you have questions about this medicine, talk to your doctor, pharmacist, or health care provider.  2018 Elsevier/Gold Standard (2008-02-25 22:10:20)

## 2018-04-30 NOTE — Progress Notes (Signed)
Hematology/Oncology Consult note [facility] Telephone:([phone]) [phone] [phone]  Patient Care Team: Patient, No Pcp Per as PCP - General (General Practice)   Name of the patient: Meghan Bullock  [id number]  1991/09/05    Reason for referral- anemia in third trimester of pregnancy   Referring physician- Dr. Jerene PitchSchuman  Date of visit: 04/30/18   History of presenting illness-patient is a 27 year old female currently [redacted] weeks pregnant.  She is high risk pregnancy with history of heroin abuse benzodiazepine abuse hepatitis C.  She had preterm delivery with her first pregnancy.  She has been referred to us for anemia and third trimester pregnancy.  Recent CBC from 04/16/2018 showed white count of 10.2, H&H of 8.6/28.8 with an MCV of 75 and a platelet count of 237.Ferritin was low at 5.  Iron studies showed low iron saturation of 4% and elevated TIBC of 563.  Folate level was normal.  B12 level was low at 173  Patient has tried taking oral iron in the past but that causes her severe constipation.  Currently patient reports feeling fatigued and has occasional lightheadedness. Denies any bleeding or bruising.  She is due to undergo normal delivery this time and no elective C-section has been planned per patient  ECOG PS- 1  Pain scale- 0   Review of systems- Review of Systems  Constitutional: Positive for malaise/fatigue. Negative for chills, fever and weight loss.  HENT: Negative for congestion, ear discharge and nosebleeds.   Eyes: Negative for blurred vision.  Respiratory: Negative for cough, hemoptysis, sputum production, shortness of breath and wheezing.   Cardiovascular: Negative for chest pain, palpitations, orthopnea and claudication.  Gastrointestinal: Negative for abdominal pain, blood in stool, constipation, diarrhea, heartburn, melena, nausea and vomiting.  Genitourinary: Negative for dysuria, flank pain, frequency, hematuria and urgency.    Musculoskeletal: Negative for back pain, joint pain and myalgias.  Skin: Negative for rash.  Neurological: Positive for dizziness. Negative for tingling, focal weakness, seizures, weakness and headaches.  Endo/Heme/Allergies: Does not bruise/bleed easily.  Psychiatric/Behavioral: Negative for depression and suicidal ideas. The patient does not have insomnia.     No Known Allergies  Patient Active Problem List   Diagnosis Date Noted  . Iron deficiency anemia 04/30/2018  . Supervision of high risk pregnancy, antepartum 10/25/2017  . 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 H/O preterm delivery, currently pregnant, first trimester 10/25/2017  . Hepatitis C 10/23/2017  . Preterm premature rupture of membranes (PPROM) with unknown onset of labor 05/12/2014  . Encephalopathy, toxic 10/03/2013  . Benzodiazepine abuse (HCC) 10/03/2013  . Heroin abuse [facility](HCC)      Past Medical History:  Diagnosis Date  . Anxiety   . Bipolar 1 disorder (HCC)   . Chlamydia   . Heroin abuse (HCC)   . Mononucleosis   . Ovarian cyst   . Polysubstance abuse [facility](HCC)      Past Surgical History:  Procedure Laterality Date  . NO PAST SURGERIES      Social History   Socioeconomic History  . Marital status: Married    Spouse name: Not on file  . Number of children: Not on file  . Years of education: Not on file  . Highest education level: Not on file  Occupational History  . Not on file  Social Needs  . Financial resource strain: Not on file  . Food insecurity:    Worry: Not on file    Inability: Not on file  . Transportation  needs:    Medical: Not on file    Non-medical: Not on file  Tobacco Use  . Smoking status: Current Every Day Smoker    Packs/day: 1.00    Years: 5.00    Pack years: 5.00    Types: Cigarettes  . Smokeless tobacco: Never Used  Substance and Sexual Activity  . Alcohol use: No  . Drug use: Yes    Types: IV, Heroin    Comment: Smokes marijuana twice a  week; heroin  LAST USED MARIJUANA- FEB,  LAST TIME USED HEROIN  2-6.-2015  . Sexual activity: Yes    Birth control/protection: None  Lifestyle  . Physical activity:    Days per week: Not on file    Minutes per session: Not on file  . Stress: Not on file  Relationships  . Social connections:    Talks on phone: Not on file    Gets together: Not on file    Attends religious service: Not on file    Active member of club or organization: Not on file    Attends meetings of clubs or organizations: Not on file    Relationship status: Not on file  . Intimate partner violence:    Fear of current or ex partner: Not on file    Emotionally abused: Not on file    Physically abused: Not on file    Forced sexual activity: Not on file  Other Topics Concern  . Not on file  Social History Narrative  . Not on file     History reviewed. No pertinent family history.   Current Outpatient Medications:  .  buprenorphine (SUBUTEX) 8 MG SUBL SL tablet, Place 8 mg 2 (two) times daily under the tongue., Disp: , Rfl:  .  Prenatal Multivit-Min-Fe-FA (PRENATAL VITAMINS) 0.8 MG tablet, Take 1 tablet by mouth daily., Disp: 30 tablet, Rfl: 12 .  acetaminophen (TYLENOL) 500 MG tablet, Take 1 tablet (500 mg total) every 6 (six) hours as needed by mouth for mild pain or moderate pain. (Patient not taking: Reported on 04/30/2018), Disp: 30 tablet, Rfl: 0 .  ferrous sulfate (FERROUSUL) 325 (65 FE) MG tablet, Take 1 tablet (325 mg total) by mouth 2 (two) times daily. (Patient not taking: Reported on 04/16/2018), Disp: 60 tablet, Rfl: 1 .  promethazine (PHENERGAN) 25 MG tablet, Take 1 tablet (25 mg total) by mouth every 6 (six) hours as needed for nausea or vomiting. (Patient not taking: Reported on 04/30/2018), Disp: 30 tablet, Rfl: 2  Current Facility-Administered Medications:  .  HYDROXYprogesterone Caproate SOAJ 275 mg, 275 mg, Subcutaneous, Once, Schuman, Christanna R, MD   Physical exam:  Vitals:   04/30/18 1328    BP: 108/75  Pulse: 83  Resp: 18  Temp: 97.9 F (36.6 C)  TempSrc: Tympanic  SpO2: 99%  Weight: 153 lb 7 oz (69.6 kg)  Height: 5\' 4"  (1.626 m)   Physical Exam  Constitutional: She is oriented to person, place, and time.  HENT:  Head: Normocephalic and atraumatic.  Eyes: Pupils are equal, round, and reactive to light. EOM are normal.  Neck: Normal range of motion.  Cardiovascular: Normal rate, regular rhythm and normal heart sounds.  Pulmonary/Chest: Effort normal and breath sounds normal.  Abdominal:  Gravid uterus  Musculoskeletal: She exhibits no edema.  Neurological: She is alert and oriented to person, place, and time.  Skin: Skin is warm and dry.       CMP Latest Ref Rng & Units 10/09/2017  Glucose 65 -  99 mg/dL 161(W)  BUN 6 - 20 mg/dL 6  Creatinine 9.60 - 4.54 mg/dL 0.98  Sodium 119 - 147 mmol/L 136  Potassium 3.5 - 5.1 mmol/L 3.6  Chloride 101 - 111 mmol/L 106  CO2 22 - 32 mmol/L 25  Calcium 8.9 - 10.3 mg/dL 9.1  Total Protein 6.5 - 8.1 g/dL -  Total Bilirubin 0.3 - 1.2 mg/dL -  Alkaline Phos 38 - 829 U/L -  AST 15 - 41 U/L -  ALT 14 - 54 U/L -   CBC Latest Ref Rng & Units 04/16/2018  WBC 3.4 - 10.8 x10E3/uL 10.2  Hemoglobin 11.1 - 15.9 g/dL 5.6(O)  Hematocrit 13.0 - 46.6 % 28.3(L)  Platelets 150 - 450 x10E3/uL 237     Assessment and plan- Patient is a 27 y.o. female with iron and B12 deficiency anemia and third trimester pregnancy  Based on labs from 04/16/2018 patient has evidence of severe iron deficiency along with evidence of B12 deficiency.  She is due in about 4 weeks.  There may not be enough for IV iron to take effect.  However I will proceed with 4 doses of Venofer 200 mg twice a week starting today along with B12 twice a week for 4 doses starting today.  I will check CBC today.  It typically takes 6 to 8 weeks for iron levels to improve. Discussed risks and benefits of IV iron including all but not limited to risk of infusion reaction, headache  and leg swelling. Patient understands and agrees to proceed as planned  I will see her back in 2 months with cbc ferritin and iron studies and b12   Thank you for this kind referral and the opportunity to participate in the care of this patient   Visit Diagnosis 1. B12 deficiency   2. Iron deficiency anemia, unspecified iron deficiency anemia type   3. Anemia affecting pregnancy in third trimester     Dr. Owens Shark, MD, MPH [facility] [phone] 04/30/2018 2:09 PM

## 2018-05-03 ENCOUNTER — Inpatient Hospital Stay: Payer: Medicaid Other

## 2018-05-03 ENCOUNTER — Encounter: Payer: Self-pay | Admitting: *Deleted

## 2018-05-03 DIAGNOSIS — D509 Iron deficiency anemia, unspecified: Secondary | ICD-10-CM

## 2018-05-03 DIAGNOSIS — O99013 Anemia complicating pregnancy, third trimester: Secondary | ICD-10-CM | POA: Diagnosis not present

## 2018-05-03 MED ORDER — SODIUM CHLORIDE 0.9 % IV SOLN
Freq: Once | INTRAVENOUS | Status: AC
Start: 2018-05-03 — End: 2018-05-03
  Administered 2018-05-03: 10:00:00 via INTRAVENOUS
  Filled 2018-05-03: qty 1000

## 2018-05-03 MED ORDER — CYANOCOBALAMIN 1000 MCG/ML IJ SOLN
1000.0000 ug | INTRAMUSCULAR | Status: DC
  Administered 2018-05-03: 1000 ug via INTRAMUSCULAR

## 2018-05-03 MED ORDER — IRON SUCROSE 20 MG/ML IV SOLN
200.0000 mg | INTRAVENOUS | Status: DC
  Administered 2018-05-03: 200 mg via INTRAVENOUS
  Filled 2018-05-03: qty 10

## 2018-05-03 NOTE — Patient Instructions (Signed)
Iron Sucrose injection What is this medicine? IRON SUCROSE (AHY ern SOO krohs) is an iron complex. Iron is used to make healthy red blood cells, which carry oxygen and nutrients throughout the body. This medicine is used to treat iron deficiency anemia in people with chronic kidney disease. This medicine may be used for other purposes; ask your health care provider or pharmacist if you have questions. COMMON BRAND NAME(S): Venofer What should I tell my health care provider before I take this medicine? They need to know if you have any of these conditions: -anemia not caused by low iron levels -heart disease -high levels of iron in the blood -kidney disease -liver disease -an unusual or allergic reaction to iron, other medicines, foods, dyes, or preservatives -pregnant or trying to get pregnant -breast-feeding How should I use this medicine? This medicine is for infusion into a vein. It is given by a health care professional in a hospital or clinic setting. Talk to your pediatrician regarding the use of this medicine in children. While this drug may be prescribed for children as young as 2 years for selected conditions, precautions do apply. Overdosage: If you think you have taken too much of this medicine contact a poison control center or emergency room at once. NOTE: This medicine is only for you. Do not share this medicine with others. What if I miss a dose? It is important not to miss your dose. Call your doctor or health care professional if you are unable to keep an appointment. What may interact with this medicine? Do not take this medicine with any of the following medications: -deferoxamine -dimercaprol -other iron products This medicine may also interact with the following medications: -chloramphenicol -deferasirox This list may not describe all possible interactions. Give your health care provider a list of all the medicines, herbs, non-prescription drugs, or dietary  supplements you use. Also tell them if you smoke, drink alcohol, or use illegal drugs. Some items may interact with your medicine. What should I watch for while using this medicine? Visit your doctor or healthcare professional regularly. Tell your doctor or healthcare professional if your symptoms do not start to get better or if they get worse. You may need blood work done while you are taking this medicine. You may need to follow a special diet. Talk to your doctor. Foods that contain iron include: whole grains/cereals, dried fruits, beans, or peas, leafy green vegetables, and organ meats (liver, kidney). What side effects may I notice from receiving this medicine? Side effects that you should report to your doctor or health care professional as soon as possible: -allergic reactions like skin rash, itching or hives, swelling of the face, lips, or tongue -breathing problems -changes in blood pressure -cough -fast, irregular heartbeat -feeling faint or lightheaded, falls -fever or chills -flushing, sweating, or hot feelings -joint or muscle aches/pains -seizures -swelling of the ankles or feet -unusually weak or tired Side effects that usually do not require medical attention (report to your doctor or health care professional if they continue or are bothersome): -diarrhea -feeling achy -headache -irritation at site where injected -nausea, vomiting -stomach upset -tiredness This list may not describe all possible side effects. Call your doctor for medical advice about side effects. You may report side effects to FDA at 1-800-FDA-1088. Where should I keep my medicine? This drug is given in a hospital or clinic and will not be stored at home. NOTE: This sheet is a summary. It may not cover all possible information. If   you have questions about this medicine, talk to your doctor, pharmacist, or health care provider.  2018 Elsevier/Gold Standard (2011-08-25 17:14:35)  

## 2018-05-08 ENCOUNTER — Inpatient Hospital Stay: Payer: Medicaid Other

## 2018-05-08 DIAGNOSIS — O99013 Anemia complicating pregnancy, third trimester: Secondary | ICD-10-CM | POA: Diagnosis not present

## 2018-05-08 DIAGNOSIS — D509 Iron deficiency anemia, unspecified: Secondary | ICD-10-CM

## 2018-05-08 MED ORDER — CYANOCOBALAMIN 1000 MCG/ML IJ SOLN
1000.0000 ug | INTRAMUSCULAR | Status: DC
  Administered 2018-05-08: 1000 ug via INTRAMUSCULAR

## 2018-05-08 MED ORDER — CYANOCOBALAMIN 1000 MCG/ML IJ SOLN
INTRAMUSCULAR | Status: AC
  Filled 2018-05-08: qty 1

## 2018-05-08 MED ORDER — IRON SUCROSE 20 MG/ML IV SOLN
INTRAVENOUS | Status: AC
  Filled 2018-05-08: qty 10

## 2018-05-08 MED ORDER — SODIUM CHLORIDE 0.9 % IV SOLN
Freq: Once | INTRAVENOUS | Status: AC
  Administered 2018-05-08: 10:00:00 via INTRAVENOUS
  Filled 2018-05-08: qty 1000

## 2018-05-08 MED ORDER — IRON SUCROSE 20 MG/ML IV SOLN
200.0000 mg | INTRAVENOUS | Status: DC
  Administered 2018-05-08: 200 mg via INTRAVENOUS
  Filled 2018-05-08: qty 10

## 2018-05-08 NOTE — Patient Instructions (Signed)
Iron Sucrose injection What is this medicine? IRON SUCROSE (AHY ern SOO krohs) is an iron complex. Iron is used to make healthy red blood cells, which carry oxygen and nutrients throughout the body. This medicine is used to treat iron deficiency anemia in people with chronic kidney disease. This medicine may be used for other purposes; ask your health care provider or pharmacist if you have questions. COMMON BRAND NAME(S): Venofer What should I tell my health care provider before I take this medicine? They need to know if you have any of these conditions: -anemia not caused by low iron levels -heart disease -high levels of iron in the blood -kidney disease -liver disease -an unusual or allergic reaction to iron, other medicines, foods, dyes, or preservatives -pregnant or trying to get pregnant -breast-feeding How should I use this medicine? This medicine is for infusion into a vein. It is given by a health care professional in a hospital or clinic setting. Talk to your pediatrician regarding the use of this medicine in children. While this drug may be prescribed for children as young as 2 years for selected conditions, precautions do apply. Overdosage: If you think you have taken too much of this medicine contact a poison control center or emergency room at once. NOTE: This medicine is only for you. Do not share this medicine with others. What if I miss a dose? It is important not to miss your dose. Call your doctor or health care professional if you are unable to keep an appointment. What may interact with this medicine? Do not take this medicine with any of the following medications: -deferoxamine -dimercaprol -other iron products This medicine may also interact with the following medications: -chloramphenicol -deferasirox This list may not describe all possible interactions. Give your health care provider a list of all the medicines, herbs, non-prescription drugs, or dietary  supplements you use. Also tell them if you smoke, drink alcohol, or use illegal drugs. Some items may interact with your medicine. What should I watch for while using this medicine? Visit your doctor or healthcare professional regularly. Tell your doctor or healthcare professional if your symptoms do not start to get better or if they get worse. You may need blood work done while you are taking this medicine. You may need to follow a special diet. Talk to your doctor. Foods that contain iron include: whole grains/cereals, dried fruits, beans, or peas, leafy green vegetables, and organ meats (liver, kidney). What side effects may I notice from receiving this medicine? Side effects that you should report to your doctor or health care professional as soon as possible: -allergic reactions like skin rash, itching or hives, swelling of the face, lips, or tongue -breathing problems -changes in blood pressure -cough -fast, irregular heartbeat -feeling faint or lightheaded, falls -fever or chills -flushing, sweating, or hot feelings -joint or muscle aches/pains -seizures -swelling of the ankles or feet -unusually weak or tired Side effects that usually do not require medical attention (report to your doctor or health care professional if they continue or are bothersome): -diarrhea -feeling achy -headache -irritation at site where injected -nausea, vomiting -stomach upset -tiredness This list may not describe all possible side effects. Call your doctor for medical advice about side effects. You may report side effects to FDA at 1-800-FDA-1088. Where should I keep my medicine? This drug is given in a hospital or clinic and will not be stored at home. NOTE: This sheet is a summary. It may not cover all possible information. If   you have questions about this medicine, talk to your doctor, pharmacist, or health care provider.  2018 Elsevier/Gold Standard (2011-08-25 17:14:35)  

## 2018-05-09 ENCOUNTER — Observation Stay
Admission: EM | Admit: 2018-05-09 | Discharge: 2018-05-09 | Discharge status: Against Medical Advice | Payer: Medicaid Other | Attending: Obstetrics and Gynecology | Admitting: Obstetrics and Gynecology

## 2018-05-09 ENCOUNTER — Other Ambulatory Visit: Payer: Self-pay

## 2018-05-09 DIAGNOSIS — O09891 Supervision of other high risk pregnancies, first trimester: Secondary | ICD-10-CM

## 2018-05-09 DIAGNOSIS — R200 Anesthesia of skin: Secondary | ICD-10-CM | POA: Insufficient documentation

## 2018-05-09 DIAGNOSIS — Z3A35 35 weeks gestation of pregnancy: Secondary | ICD-10-CM | POA: Insufficient documentation

## 2018-05-09 DIAGNOSIS — O99333 Smoking (tobacco) complicating pregnancy, third trimester: Secondary | ICD-10-CM | POA: Diagnosis not present

## 2018-05-09 DIAGNOSIS — O99321 Drug use complicating pregnancy, first trimester: Secondary | ICD-10-CM

## 2018-05-09 DIAGNOSIS — Z79891 Long term (current) use of opiate analgesic: Secondary | ICD-10-CM | POA: Diagnosis not present

## 2018-05-09 DIAGNOSIS — F1910 Other psychoactive substance abuse, uncomplicated: Secondary | ICD-10-CM

## 2018-05-09 DIAGNOSIS — R1012 Left upper quadrant pain: Secondary | ICD-10-CM | POA: Diagnosis not present

## 2018-05-09 DIAGNOSIS — F17210 Nicotine dependence, cigarettes, uncomplicated: Secondary | ICD-10-CM | POA: Diagnosis not present

## 2018-05-09 DIAGNOSIS — Z5321 Procedure and treatment not carried out due to patient leaving prior to being seen by health care provider: Secondary | ICD-10-CM | POA: Insufficient documentation

## 2018-05-09 DIAGNOSIS — M25552 Pain in left hip: Secondary | ICD-10-CM | POA: Insufficient documentation

## 2018-05-09 DIAGNOSIS — O26893 Other specified pregnancy related conditions, third trimester: Secondary | ICD-10-CM | POA: Diagnosis present

## 2018-05-09 DIAGNOSIS — O0990 Supervision of high risk pregnancy, unspecified, unspecified trimester: Secondary | ICD-10-CM

## 2018-05-09 DIAGNOSIS — B182 Chronic viral hepatitis C: Secondary | ICD-10-CM

## 2018-05-09 DIAGNOSIS — O09211 Supervision of pregnancy with history of pre-term labor, first trimester: Secondary | ICD-10-CM

## 2018-05-09 LAB — URINE DRUG SCREEN, QUALITATIVE (ARMC ONLY)
Amphetamines, Ur Screen: NOT DETECTED
BARBITURATES, UR SCREEN: NOT DETECTED
Benzodiazepine, Ur Scrn: POSITIVE — AB
CANNABINOID 50 NG, UR ~~LOC~~: POSITIVE — AB
COCAINE METABOLITE, UR ~~LOC~~: NOT DETECTED
MDMA (Ecstasy)Ur Screen: NOT DETECTED
METHADONE SCREEN, URINE: NOT DETECTED
Opiate, Ur Screen: NOT DETECTED
Phencyclidine (PCP) Ur S: NOT DETECTED
TRICYCLIC, UR SCREEN: NOT DETECTED

## 2018-05-09 LAB — WET PREP, GENITAL
CLUE CELLS WET PREP: NONE SEEN
SPERM: NONE SEEN
TRICH WET PREP: NONE SEEN
WBC, Wet Prep HPF POC: NONE SEEN
Yeast Wet Prep HPF POC: NONE SEEN

## 2018-05-09 LAB — URINALYSIS, COMPLETE (UACMP) WITH MICROSCOPIC
BILIRUBIN URINE: NEGATIVE
Bacteria, UA: NONE SEEN
GLUCOSE, UA: NEGATIVE mg/dL
Hgb urine dipstick: NEGATIVE
KETONES UR: NEGATIVE mg/dL
Leukocytes, UA: NEGATIVE
NITRITE: NEGATIVE
PH: 8 (ref 5.0–8.0)
Protein, ur: NEGATIVE mg/dL
SPECIFIC GRAVITY, URINE: 1.015 (ref 1.005–1.030)

## 2018-05-09 MED ORDER — ACETAMINOPHEN 325 MG PO TABS: 650.0000 mg | ORAL_TABLET | ORAL | Status: DC | PRN

## 2018-05-09 NOTE — Progress Notes (Signed)
Pt came in to be evaluated for ctx. Meghan EveJaci Bullock, CNM, notified of situation and went with nurse to evaluate pt. CNM asked for visitors to step out for vaginal exam/obtain vaginal swabs. When pt was alone we discussed social history and performed suicide assessment. Pt states she has been [facility] for [facility]. Pt states her current husband, whom she lives with, is "schizophrenic and paranoid. He needs help, but is not willing to admit problem. I feel safe at home, he is just stressful. He is the best thing that has ever happened to me". Pt reports feelings of hopelessness at times. Told pt of spiritual care consult that was ordered due to answers to questions. Pt verbalized understanding. Vaginal exam and cultures obtained. [name], CNM, notified of audible deceleration and educated pt on importance of being evaluated to ensure reassuring FHR. Pt states she does not want to stay for a long time. Pt encouraged to stay for results to be treated for any infection that may be present and to monitor FHR. Pt called out to nurse at 1705 and states she is leaving AMA. Pt educated of risks for leaving and still insists to leave AMA. Form signed at 1706 and pt left unit at 1708.

## 2018-05-09 NOTE — OB Triage Note (Signed)
Pt presents c/o ctx that started around 830 this morning. Pt denies any bleeding or LOF. Reports positive fetal movement.vitals WNL. Will continue to monitor.

## 2018-05-09 NOTE — Progress Notes (Signed)
Pt given instructions and encouraged not to leave. Education provided and pt insisted on leaving. Pt signed AMA forms and left at 1708.

## 2018-05-11 ENCOUNTER — Inpatient Hospital Stay: Payer: Medicaid Other

## 2018-05-11 VITALS — BP 119/74 | HR 86 | Temp 97.0°F | Resp 18

## 2018-05-11 DIAGNOSIS — D509 Iron deficiency anemia, unspecified: Secondary | ICD-10-CM

## 2018-05-11 DIAGNOSIS — O99013 Anemia complicating pregnancy, third trimester: Secondary | ICD-10-CM | POA: Diagnosis not present

## 2018-05-11 LAB — URINE CULTURE

## 2018-05-11 MED ORDER — CYANOCOBALAMIN 1000 MCG/ML IJ SOLN
1000.0000 ug | INTRAMUSCULAR | Status: DC
  Administered 2018-05-11: 1000 ug via INTRAMUSCULAR

## 2018-05-11 MED ORDER — SODIUM CHLORIDE 0.9 % IV SOLN
Freq: Once | INTRAVENOUS | Status: AC
  Administered 2018-05-11: 14:00:00 via INTRAVENOUS
  Filled 2018-05-11: qty 1000

## 2018-05-11 MED ORDER — HEPARIN SOD (PORK) LOCK FLUSH 100 UNIT/ML IV SOLN: 500.0000 [IU] | Freq: Once | INTRAVENOUS | Status: DC | PRN

## 2018-05-11 MED ORDER — HEPARIN SOD (PORK) LOCK FLUSH 100 UNIT/ML IV SOLN: 250.0000 [IU] | Freq: Once | INTRAVENOUS | Status: DC | PRN

## 2018-05-11 MED ORDER — SODIUM CHLORIDE 0.9% FLUSH
10.0000 mL | INTRAVENOUS | Status: DC | PRN
  Filled 2018-05-11: qty 10

## 2018-05-11 MED ORDER — ALTEPLASE 2 MG IJ SOLR: 2.0000 mg | Freq: Once | INTRAMUSCULAR | Status: DC | PRN

## 2018-05-11 MED ORDER — IRON SUCROSE 20 MG/ML IV SOLN
200.0000 mg | INTRAVENOUS | Status: DC
  Administered 2018-05-11: 200 mg via INTRAVENOUS
  Filled 2018-05-11: qty 10

## 2018-05-11 MED ORDER — SODIUM CHLORIDE 0.9% FLUSH
3.0000 mL | Freq: Once | INTRAVENOUS | Status: DC | PRN
  Filled 2018-05-11: qty 3

## 2018-05-11 NOTE — Final Progress Note (Signed)
Physician Final Progress Note  Patient ID: Meghan Bullock MRN: [id number] DOB/AGE: 22-Mar-1991 27 y.o.  Admit date: 05/09/2018 Admitting provider: Vena AustriaAndreas Staebler, MD Discharge date: Patient left AMA  Admission Diagnoses: Indication for care in labor and delivery, antepartum  Discharge Diagnoses:  Active Problems:   * No active hospital problems. *   History of Present Illness: The patient is a 27 y.o. female [phone] at 3285w4d who presents for contractions that began around 830 am. She rates her pain as 10/10. She also has some numbness/pain on the left side of her upper abdomen and some hip/leg pain on the same side. She endorses good fetal movements. She denies vaginal bleeding and loss of fluid. She denies substance abuse. She reports social problems with her husband, but says that she feels safe at home.  Review of systems negative unless otherwise noted in HPI.  Past Medical History:  Diagnosis Date  . Anxiety   . Bipolar 1 disorder (HCC)   . Chlamydia   . Heroin abuse (HCC)   . Mononucleosis   . Ovarian cyst   . Polysubstance abuse [facility](HCC)     Past Surgical History:  Procedure Laterality Date  . NO PAST SURGERIES      Current Facility-Administered Medications on File Prior to Encounter  Medication Dose Route Frequency Provider Last Rate Last Dose  . HYDROXYprogesterone Caproate SOAJ 275 mg  275 mg Subcutaneous Once Schuman, Jaquelyn Bitterhristanna R, MD       Current Outpatient Medications on File Prior to Encounter  Medication Sig Dispense Refill  . buprenorphine (SUBUTEX) 8 MG SUBL SL tablet Place 8 mg 2 (two) times daily under the tongue.    . Prenatal Multivit-Min-Fe-FA (PRENATAL VITAMINS) 0.8 MG tablet Take 1 tablet by mouth daily. 30 tablet 12  . acetaminophen (TYLENOL) 500 MG tablet Take 1 tablet (500 mg total) every 6 (six) hours as needed by mouth for mild pain or moderate pain. (Patient not taking: Reported on 04/30/2018) 30 tablet 0  . ferrous sulfate (FERROUSUL) 325  (65 FE) MG tablet Take 1 tablet (325 mg total) by mouth 2 (two) times daily. (Patient not taking: Reported on 04/16/2018) 60 tablet 1  . promethazine (PHENERGAN) 25 MG tablet Take 1 tablet (25 mg total) by mouth every 6 (six) hours as needed for nausea or vomiting. (Patient not taking: Reported on 04/30/2018) 30 tablet 2    No Known Allergies  Social History   Socioeconomic History  . Marital status: Married    Spouse name: Not on file  . Number of children: Not on file  . Years of education: Not on file  . Highest education level: Not on file  Occupational History  . Not on file  Social Needs  . Financial resource strain: Not on file  . Food insecurity:    Worry: Not on file    Inability: Not on file  . Transportation needs:    Medical: Not on file    Non-medical: Not on file  Tobacco Use  . Smoking status: Current Every Day Smoker    Packs/day: 1.00    Years: 5.00    Pack years: 5.00    Types: Cigarettes  . Smokeless tobacco: Never Used  Substance and Sexual Activity  . Alcohol use: No  . Drug use: Yes    Types: IV, Heroin    Comment: Smokes marijuana twice a week; heroin  LAST USED MARIJUANA- FEB,  LAST TIME USED HEROIN  2-6.-2015  . Sexual activity: Yes  Birth control/protection: None  Lifestyle  . Physical activity:    Days per week: Not on file    Minutes per session: Not on file  . Stress: Not on file  Relationships  . Social connections:    Talks on phone: Not on file    Gets together: Not on file    Attends religious service: Not on file    Active member of club or organization: Not on file    Attends meetings of clubs or organizations: Not on file    Relationship status: Not on file  . Intimate partner violence:    Fear of current or ex partner: Not on file    Emotionally abused: Not on file    Physically abused: Not on file    Forced sexual activity: Not on file  Other Topics Concern  . Not on file  Social History Narrative  . Not on file     Physical Exam: BP 123/67 (BP Location: Right Arm)   Pulse 91   Temp 98.3 F (36.8 C) (Oral)   Resp 18   Ht 5\' 4"  (1.626 m)   Wt 153 lb (69.4 kg)   LMP 08/15/2017 (Exact Date)   BMI 26.26 kg/m   Gen: NAD CV: Regular rate Pulm: No increased work of breathing Pelvic: exterrnal genitalia normal, no discharge, cervix closed/thick/high Ext: no signs of DVT  EFM: Baseline 145 bpm, moderate variability, accelerations present, variable declerations present, toco with rare contractions. Category II tracing.  Consults: [place], patient left before completion  Significant Findings/ Diagnostic Studies: labs: wet prep negative, UA negative, UDS positive for marijuana and benzodiazepenes  Disposition: Left against medical advice.    Allergies as of 05/09/2018   No Known Allergies     Medication List    ASK your doctor about these medications   acetaminophen 500 MG tablet Commonly known as:  TYLENOL Take 1 tablet (500 mg total) every 6 (six) hours as needed by mouth for mild pain or moderate pain.   buprenorphine 8 MG Subl SL tablet Commonly known as:  SUBUTEX Place 8 mg 2 (two) times daily under the tongue.   ferrous sulfate 325 (65 FE) MG tablet Commonly known as:  FERROUSUL Take 1 tablet (325 mg total) by mouth 2 (two) times daily.   Prenatal Vitamins 0.8 MG tablet Take 1 tablet by mouth daily.   promethazine 25 MG tablet Commonly known as:  PHENERGAN Take 1 tablet (25 mg total) by mouth every 6 (six) hours as needed for nausea or vomiting.      Advised patient of need for extended fetal monitoring due to Category II tracing with fetal decelerations. Also advised of need to stay for results of lab tests to rule out infection. Patient informed nurse that she was leaving and signed AMA form.  Signed: Oswaldo Conroy, CNM

## 2018-05-11 NOTE — Discharge Summary (Signed)
See Final Progress Note 05/09/2018.

## 2018-06-13 ENCOUNTER — Other Ambulatory Visit (HOSPITAL_COMMUNITY)
Admission: RE | Admit: 2018-06-13 | Discharge: 2018-06-13 | Disposition: A | Discharge status: Home / Self Care | Payer: Medicaid Other | Source: Ambulatory Visit | Attending: Certified Nurse Midwife | Admitting: Certified Nurse Midwife

## 2018-06-13 ENCOUNTER — Ambulatory Visit (INDEPENDENT_AMBULATORY_CARE_PROVIDER_SITE_OTHER): Payer: Medicaid Other | Admitting: Certified Nurse Midwife

## 2018-06-13 VITALS — BP 108/62 | Wt 151.0 lb

## 2018-06-13 DIAGNOSIS — Z113 Encounter for screening for infections with a predominantly sexual mode of transmission: Secondary | ICD-10-CM

## 2018-06-13 DIAGNOSIS — O99013 Anemia complicating pregnancy, third trimester: Secondary | ICD-10-CM

## 2018-06-13 DIAGNOSIS — O480 Post-term pregnancy: Secondary | ICD-10-CM | POA: Diagnosis not present

## 2018-06-13 DIAGNOSIS — Z3A40 40 weeks gestation of pregnancy: Secondary | ICD-10-CM

## 2018-06-13 DIAGNOSIS — O99323 Drug use complicating pregnancy, third trimester: Secondary | ICD-10-CM

## 2018-06-13 DIAGNOSIS — F1290 Cannabis use, unspecified, uncomplicated: Secondary | ICD-10-CM

## 2018-06-13 DIAGNOSIS — O0990 Supervision of high risk pregnancy, unspecified, unspecified trimester: Secondary | ICD-10-CM

## 2018-06-13 DIAGNOSIS — O99019 Anemia complicating pregnancy, unspecified trimester: Secondary | ICD-10-CM

## 2018-06-13 DIAGNOSIS — Z3685 Encounter for antenatal screening for Streptococcus B: Secondary | ICD-10-CM

## 2018-06-13 MED ORDER — PROMETHAZINE HCL 25 MG PO TABS: ORAL_TABLET | ORAL | 2 refills | Status: DC

## 2018-06-13 NOTE — Progress Notes (Signed)
Intermittent ctx today.

## 2018-06-13 NOTE — Progress Notes (Signed)
HROB at 40wk2days: Has not been for routine prenatal visits since 32 weeks. She reports transportation issues. Is taking Subutex 2/0.5 on some days and smoking some MJ for nausea she has had recently. Never picked up phenergan prescription. Has lost 4# since 32 weeks with a net weight loss of 12# Was seen in L&D at 35 weeks for contractions and left against medical advice. Had a Cat 2 tracing and a UDS positive for benzodiazepines and MJ Has had four iron infusions in June. CBC done today NST today reactive with baseline 130-135 and accelerations to 150,moderate variability Having some irregular but painful contractions. Had 3 contractions in 23 minutes during the NST  FH 32cm. Cervix: 1.5 to 2cm/ 75%/-1 to 0/ posterior (had some spotting after exam) GBS and Aptima done Cephalic  On Leopold's UDS today Contractions palpate moderately strong. Bottle feeding Discussed some options for contraception pp Given postpartum BC booklet FKC and labor precautions Given samples of Provida DHA RX for phenergan sent to [facility] IOL scheduled for 21 July at 0800  Meghan Bullock, [place]

## 2018-06-14 LAB — CBC WITH DIFFERENTIAL/PLATELET
Basophils Absolute: 0 10*3/uL (ref 0.0–0.2)
Basos: 0 %
EOS (ABSOLUTE): 0 10*3/uL (ref 0.0–0.4)
Eos: 0 %
HEMATOCRIT: 36.2 % (ref 34.0–46.6)
Hemoglobin: 11.9 g/dL (ref 11.1–15.9)
IMMATURE GRANULOCYTES: 1 %
Immature Grans (Abs): 0.1 10*3/uL (ref 0.0–0.1)
LYMPHS ABS: 1.9 10*3/uL (ref 0.7–3.1)
Lymphs: 16 %
MCH: 25.7 pg — AB (ref 26.6–33.0)
MCHC: 32.9 g/dL (ref 31.5–35.7)
MCV: 78 fL — ABNORMAL LOW (ref 79–97)
MONOS ABS: 0.8 10*3/uL (ref 0.1–0.9)
Monocytes: 7 %
Neutrophils Absolute: 9.5 10*3/uL — ABNORMAL HIGH (ref 1.4–7.0)
Neutrophils: 76 %
Platelets: 242 10*3/uL (ref 150–450)
RBC: 4.63 x10E6/uL (ref 3.77–5.28)
RDW: 24.4 % — AB (ref 12.3–15.4)
WBC: 12.4 10*3/uL — AB (ref 3.4–10.8)

## 2018-06-15 ENCOUNTER — Encounter: Payer: Self-pay | Admitting: *Deleted

## 2018-06-15 ENCOUNTER — Inpatient Hospital Stay: Payer: Medicaid Other | Admitting: Anesthesiology

## 2018-06-15 ENCOUNTER — Other Ambulatory Visit: Payer: Self-pay

## 2018-06-15 ENCOUNTER — Inpatient Hospital Stay
Admission: EM | Admit: 2018-06-15 | Discharge: 2018-06-17 | DRG: 806 | Disposition: A | Discharge status: Home / Self Care | Payer: Medicaid Other | Attending: Obstetrics and Gynecology | Admitting: Obstetrics and Gynecology

## 2018-06-15 DIAGNOSIS — F17200 Nicotine dependence, unspecified, uncomplicated: Secondary | ICD-10-CM | POA: Diagnosis present

## 2018-06-15 DIAGNOSIS — B1920 Unspecified viral hepatitis C without hepatic coma: Secondary | ICD-10-CM | POA: Diagnosis present

## 2018-06-15 DIAGNOSIS — O9842 Viral hepatitis complicating childbirth: Secondary | ICD-10-CM | POA: Diagnosis present

## 2018-06-15 DIAGNOSIS — Z3490 Encounter for supervision of normal pregnancy, unspecified, unspecified trimester: Secondary | ICD-10-CM

## 2018-06-15 DIAGNOSIS — F1910 Other psychoactive substance abuse, uncomplicated: Secondary | ICD-10-CM | POA: Diagnosis present

## 2018-06-15 DIAGNOSIS — Z3A40 40 weeks gestation of pregnancy: Secondary | ICD-10-CM

## 2018-06-15 DIAGNOSIS — O99334 Smoking (tobacco) complicating childbirth: Secondary | ICD-10-CM | POA: Diagnosis present

## 2018-06-15 DIAGNOSIS — R1032 Left lower quadrant pain: Secondary | ICD-10-CM | POA: Diagnosis present

## 2018-06-15 DIAGNOSIS — O99324 Drug use complicating childbirth: Secondary | ICD-10-CM | POA: Diagnosis present

## 2018-06-15 DIAGNOSIS — O9902 Anemia complicating childbirth: Secondary | ICD-10-CM | POA: Diagnosis present

## 2018-06-15 DIAGNOSIS — D509 Iron deficiency anemia, unspecified: Secondary | ICD-10-CM | POA: Diagnosis present

## 2018-06-15 DIAGNOSIS — O480 Post-term pregnancy: Secondary | ICD-10-CM

## 2018-06-15 DIAGNOSIS — R1030 Lower abdominal pain, unspecified: Secondary | ICD-10-CM | POA: Diagnosis present

## 2018-06-15 DIAGNOSIS — O26893 Other specified pregnancy related conditions, third trimester: Secondary | ICD-10-CM | POA: Diagnosis present

## 2018-06-15 DIAGNOSIS — O26899 Other specified pregnancy related conditions, unspecified trimester: Secondary | ICD-10-CM | POA: Diagnosis present

## 2018-06-15 DIAGNOSIS — O0933 Supervision of pregnancy with insufficient antenatal care, third trimester: Secondary | ICD-10-CM

## 2018-06-15 HISTORY — DX: Encounter for supervision of normal pregnancy, unspecified, unspecified trimester: Z34.90

## 2018-06-15 HISTORY — DX: Inflammatory liver disease, unspecified: K75.9

## 2018-06-15 HISTORY — DX: Anemia, unspecified: D64.9

## 2018-06-15 LAB — URINALYSIS, ROUTINE W REFLEX MICROSCOPIC
BACTERIA UA: NONE SEEN
BILIRUBIN URINE: NEGATIVE
Glucose, UA: NEGATIVE mg/dL
Ketones, ur: NEGATIVE mg/dL
Nitrite: NEGATIVE
PROTEIN: NEGATIVE mg/dL
SPECIFIC GRAVITY, URINE: 1.011 (ref 1.005–1.030)
pH: 6 (ref 5.0–8.0)

## 2018-06-15 LAB — CBC
HCT: 33.8 % — ABNORMAL LOW (ref 35.0–47.0)
Hemoglobin: 11.5 g/dL — ABNORMAL LOW (ref 12.0–16.0)
MCH: 26.9 pg (ref 26.0–34.0)
MCHC: 34.1 g/dL (ref 32.0–36.0)
MCV: 78.9 fL — ABNORMAL LOW (ref 80.0–100.0)
PLATELETS: 221 10*3/uL (ref 150–440)
RBC: 4.29 MIL/uL (ref 3.80–5.20)
RDW: 26.4 % — AB (ref 11.5–14.5)
WBC: 10.5 10*3/uL (ref 3.6–11.0)

## 2018-06-15 LAB — URINE DRUG SCREEN, QUALITATIVE (ARMC ONLY)
Amphetamines, Ur Screen: NOT DETECTED
BENZODIAZEPINE, UR SCRN: NOT DETECTED
Cannabinoid 50 Ng, Ur ~~LOC~~: POSITIVE — AB
Cocaine Metabolite,Ur ~~LOC~~: NOT DETECTED
MDMA (Ecstasy)Ur Screen: NOT DETECTED
METHADONE SCREEN, URINE: NOT DETECTED
Opiate, Ur Screen: POSITIVE — AB
Phencyclidine (PCP) Ur S: NOT DETECTED
Tricyclic, Ur Screen: NOT DETECTED

## 2018-06-15 LAB — CERVICOVAGINAL ANCILLARY ONLY
CHLAMYDIA, DNA PROBE: NEGATIVE
Neisseria Gonorrhea: NEGATIVE
Trichomonas: NEGATIVE

## 2018-06-15 LAB — TYPE AND SCREEN
ABO/RH(D): O POS
Antibody Screen: NEGATIVE

## 2018-06-15 MED ORDER — ACETAMINOPHEN 325 MG PO TABS: 650.0000 mg | ORAL_TABLET | ORAL | Status: DC | PRN

## 2018-06-15 MED ORDER — ONDANSETRON HCL 4 MG/2ML IJ SOLN
4.0000 mg | Freq: Four times a day (QID) | INTRAMUSCULAR | Status: DC | PRN
  Filled 2018-06-15: qty 2

## 2018-06-15 MED ORDER — TERBUTALINE SULFATE 1 MG/ML IJ SOLN: 0.2500 mg | Freq: Once | INTRAMUSCULAR | Status: DC | PRN

## 2018-06-15 MED ORDER — LACTATED RINGERS IV SOLN: 500.0000 mL | Freq: Once | INTRAVENOUS | Status: DC

## 2018-06-15 MED ORDER — ONDANSETRON HCL 4 MG PO TABS: 4.0000 mg | ORAL_TABLET | ORAL | Status: DC | PRN

## 2018-06-15 MED ORDER — FENTANYL 2.5 MCG/ML W/ROPIVACAINE 0.15% IN NS 100 ML EPIDURAL (ARMC)
EPIDURAL | Status: AC
  Filled 2018-06-15: qty 100

## 2018-06-15 MED ORDER — DIBUCAINE 1 % RE OINT: 1.0000 "application " | TOPICAL_OINTMENT | RECTAL | Status: DC | PRN

## 2018-06-15 MED ORDER — HYDROXYZINE HCL 25 MG PO TABS
25.0000 mg | ORAL_TABLET | ORAL | Status: DC | PRN
  Administered 2018-06-15: 25 mg via ORAL
  Filled 2018-06-15 (×3): qty 1

## 2018-06-15 MED ORDER — LIDOCAINE HCL (PF) 1 % IJ SOLN
INTRAMUSCULAR | Status: DC | PRN
  Administered 2018-06-15: 3 mL

## 2018-06-15 MED ORDER — BUPRENORPHINE HCL 8 MG SL SUBL
SUBLINGUAL_TABLET | SUBLINGUAL | Status: AC
  Administered 2018-06-15: 8 mg via SUBLINGUAL
  Filled 2018-06-15: qty 1

## 2018-06-15 MED ORDER — BUPIVACAINE HCL (PF) 0.5 % IJ SOLN
INTRAMUSCULAR | Status: AC
  Filled 2018-06-15: qty 30

## 2018-06-15 MED ORDER — ONDANSETRON HCL 4 MG/2ML IJ SOLN: 4.0000 mg | INTRAMUSCULAR | Status: DC | PRN

## 2018-06-15 MED ORDER — OXYTOCIN BOLUS FROM INFUSION
500.0000 mL | Freq: Once | INTRAVENOUS | Status: DC
  Administered 2018-06-15: 500 mL via INTRAVENOUS

## 2018-06-15 MED ORDER — NICOTINE 21 MG/24HR TD PT24
21.0000 mg | MEDICATED_PATCH | Freq: Every day | TRANSDERMAL | Status: DC
  Administered 2018-06-15: 21 mg via TRANSDERMAL
  Filled 2018-06-15 (×3): qty 1

## 2018-06-15 MED ORDER — PHENYLEPHRINE 40 MCG/ML (10ML) SYRINGE FOR IV PUSH (FOR BLOOD PRESSURE SUPPORT)
80.0000 ug | PREFILLED_SYRINGE | INTRAVENOUS | Status: DC | PRN
  Filled 2018-06-15: qty 5

## 2018-06-15 MED ORDER — IBUPROFEN 600 MG PO TABS
ORAL_TABLET | ORAL | Status: AC
  Filled 2018-06-15: qty 1

## 2018-06-15 MED ORDER — BUPIVACAINE HCL (PF) 0.25 % IJ SOLN
INTRAMUSCULAR | Status: DC | PRN
  Administered 2018-06-15 (×2): 5 mL via EPIDURAL

## 2018-06-15 MED ORDER — SIMETHICONE 80 MG PO CHEW: 80.0000 mg | CHEWABLE_TABLET | ORAL | Status: DC | PRN

## 2018-06-15 MED ORDER — DIPHENHYDRAMINE HCL 25 MG PO CAPS
25.0000 mg | ORAL_CAPSULE | Freq: Four times a day (QID) | ORAL | Status: DC | PRN
Start: 2018-06-15 — End: 2018-06-18

## 2018-06-15 MED ORDER — NON FORMULARY: 8.0000 mg | Freq: Two times a day (BID) | Status: DC

## 2018-06-15 MED ORDER — BUPRENORPHINE HCL 8 MG SL SUBL
8.0000 mg | SUBLINGUAL_TABLET | Freq: Two times a day (BID) | SUBLINGUAL | Status: DC
  Administered 2018-06-15 – 2018-06-17 (×6): 8 mg via SUBLINGUAL
  Filled 2018-06-15 (×6): qty 1

## 2018-06-15 MED ORDER — BENZOCAINE-MENTHOL 20-0.5 % EX AERO: 1.0000 "application " | INHALATION_SPRAY | CUTANEOUS | Status: DC | PRN

## 2018-06-15 MED ORDER — ACETAMINOPHEN 325 MG PO TABS
650.0000 mg | ORAL_TABLET | ORAL | Status: DC | PRN
  Administered 2018-06-16: 650 mg via ORAL
  Filled 2018-06-15: qty 2

## 2018-06-15 MED ORDER — OXYTOCIN 40 UNITS IN LACTATED RINGERS INFUSION - SIMPLE MED
1.0000 m[IU]/min | INTRAVENOUS | Status: DC
  Administered 2018-06-15: 2 m[IU]/min via INTRAVENOUS
  Filled 2018-06-15: qty 1000

## 2018-06-15 MED ORDER — LACTATED RINGERS IV SOLN
INTRAVENOUS | Status: DC
  Administered 2018-06-15: 10:00:00 via INTRAVENOUS

## 2018-06-15 MED ORDER — PRENATAL MULTIVITAMIN CH
1.0000 | ORAL_TABLET | Freq: Every day | ORAL | Status: DC
  Administered 2018-06-16 – 2018-06-17 (×2): 1 via ORAL
  Filled 2018-06-15 (×2): qty 1

## 2018-06-15 MED ORDER — IBUPROFEN 600 MG PO TABS
600.0000 mg | ORAL_TABLET | Freq: Four times a day (QID) | ORAL | Status: DC
  Administered 2018-06-15 – 2018-06-17 (×9): 600 mg via ORAL
  Filled 2018-06-15 (×9): qty 1

## 2018-06-15 MED ORDER — SENNOSIDES-DOCUSATE SODIUM 8.6-50 MG PO TABS
2.0000 | ORAL_TABLET | ORAL | Status: DC
  Administered 2018-06-16: 1 via ORAL
  Administered 2018-06-17: 2 via ORAL
  Filled 2018-06-15 (×2): qty 2

## 2018-06-15 MED ORDER — OXYTOCIN 40 UNITS IN LACTATED RINGERS INFUSION - SIMPLE MED: 2.5000 [IU]/h | INTRAVENOUS | Status: DC

## 2018-06-15 MED ORDER — EPHEDRINE 5 MG/ML INJ
10.0000 mg | INTRAVENOUS | Status: DC | PRN
  Filled 2018-06-15: qty 2

## 2018-06-15 MED ORDER — LIDOCAINE-EPINEPHRINE (PF) 1.5 %-1:200000 IJ SOLN
INTRAMUSCULAR | Status: DC | PRN
  Administered 2018-06-15: 3 mL via PERINEURAL

## 2018-06-15 MED ORDER — FENTANYL 2.5 MCG/ML W/ROPIVACAINE 0.15% IN NS 100 ML EPIDURAL (ARMC)
12.0000 mL/h | EPIDURAL | Status: DC
  Administered 2018-06-15: 12 mL/h via EPIDURAL

## 2018-06-15 MED ORDER — ONDANSETRON HCL 4 MG/2ML IJ SOLN: 4.0000 mg | Freq: Four times a day (QID) | INTRAMUSCULAR | Status: DC | PRN

## 2018-06-15 MED ORDER — LACTATED RINGERS IV SOLN: 500.0000 mL | INTRAVENOUS | Status: DC | PRN

## 2018-06-15 MED ORDER — DIPHENHYDRAMINE HCL 50 MG/ML IJ SOLN: 12.5000 mg | INTRAMUSCULAR | Status: DC | PRN

## 2018-06-15 MED ORDER — WITCH HAZEL-GLYCERIN EX PADS: 1.0000 "application " | MEDICATED_PAD | CUTANEOUS | Status: DC | PRN

## 2018-06-15 MED ORDER — FERROUS SULFATE 325 (65 FE) MG PO TABS
325.0000 mg | ORAL_TABLET | Freq: Two times a day (BID) | ORAL | Status: DC
  Administered 2018-06-16: 325 mg via ORAL
  Filled 2018-06-15: qty 1

## 2018-06-15 MED ORDER — COCONUT OIL OIL: 1.0000 "application " | TOPICAL_OIL | Status: DC | PRN

## 2018-06-15 NOTE — OB Triage Note (Signed)
Pt arrived from home with complaints of contractions all day but denies any vaginal bleeding or leaking of fluid. Pt reports positive fetal movement.

## 2018-06-15 NOTE — Anesthesia Preprocedure Evaluation (Signed)
Anesthesia Evaluation  Patient identified by MRN, date of birth, ID band Patient awake    Reviewed: Allergy & Precautions, NPO status , Patient's Chart, lab work & pertinent test results  History of Anesthesia Complications Negative for: history of anesthetic complications  Airway Mallampati: II       Dental no notable dental hx.    Pulmonary neg pulmonary ROS, Current Smoker,    Pulmonary exam normal        Cardiovascular negative cardio ROS Normal cardiovascular exam     Neuro/Psych negative neurological ROS     GI/Hepatic negative GI ROS,   Endo/Other  negative endocrine ROS  Renal/GU negative Renal ROS  negative genitourinary   Musculoskeletal negative musculoskeletal ROS (+)   Abdominal   Peds negative pediatric ROS (+)  Hematology negative hematology ROS (+)   Anesthesia Other Findings   Reproductive/Obstetrics negative OB ROS                             Anesthesia Physical Anesthesia Plan  ASA: II  Anesthesia Plan: Epidural   Post-op Pain Management:    Induction:   PONV Risk Score and Plan:   Airway Management Planned:   Additional Equipment:   Intra-op Plan:   Post-operative Plan:   Informed Consent: I have reviewed the patients History and Physical, chart, labs and discussed the procedure including the risks, benefits and alternatives for the proposed anesthesia with the patient or authorized representative who has indicated his/her understanding and acceptance.     Plan Discussed with: CRNA and Anesthesiologist  Anesthesia Plan Comments:         Anesthesia Quick Evaluation

## 2018-06-15 NOTE — H&P (Signed)
Obstetrics Admission History & Physical   Induction of labor at term  HPI:  27 y.o. [id number] @ [redacted]w[redacted]d (06/11/2018, by Ultrasound). Admitted on 06/15/2018:   Patient Active Problem List   Diagnosis Date Noted  . Pregnancy related bilateral lower abdominal pain, antepartum 06/15/2018  . Encounter for induction of labor 06/15/2018  . Iron deficiency anemia 04/30/2018  . Supervision of high risk pregnancy, antepartum 10/25/2017  . 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 H/O preterm delivery, currently pregnant, first trimester 10/25/2017  . Hepatitis C 10/23/2017  . Preterm premature rupture of membranes (PPROM) with unknown onset of labor 05/12/2014  . Encephalopathy, toxic 10/03/2013  . Benzodiazepine abuse (HCC) 10/03/2013  . Heroin abuse (HCC)      Presents for painful contractions all day on 7/18. No vaginal bleeding or loss of fluid. Good fetal movement. Induction of labor for Category II EFM and multigravida at term.  Prenatal care at: at [facility]. Pregnancy complicated by drug use, tobacco use, Hepatitis C positive.  ROS: A review of systems was performed and negative, except as stated in the above HPI. PMHx:  Past Medical History:  Diagnosis Date  . Anemia   . Anxiety   . Bipolar 1 disorder (HCC)   . Chlamydia   . Hepatitis   . Heroin abuse (HCC)   . Mononucleosis   . Ovarian cyst   . Polysubstance abuse (HCC)    PSHx:  Past Surgical History:  Procedure Laterality Date  . NO PAST SURGERIES     Medications:  Facility-Administered Medications Prior to Admission  Medication Dose Route Frequency Provider Last Rate Last Dose  . HYDROXYprogesterone Caproate SOAJ 275 mg  275 mg Subcutaneous Once Schuman, Christanna R, MD       Medications Prior to Admission  Medication Sig Dispense Refill Last Dose  . buprenorphine (SUBUTEX) 8 MG SUBL SL tablet Place 8 mg 2 (two) times daily under the tongue.   06/14/2018 at Unknown time  . Prenatal Multivit-Min-Fe-FA  (PRENATAL VITAMINS) 0.8 MG tablet Take 1 tablet by mouth daily. 30 tablet 12 Past Week at Unknown time  . promethazine (PHENERGAN) 25 MG tablet Take 0.5-1 tablet every 6 hours prn nausea 30 tablet 2 Past Month at Unknown time  . acetaminophen (TYLENOL) 500 MG tablet Take 1 tablet (500 mg total) every 6 (six) hours as needed by mouth for mild pain or moderate pain. (Patient not taking: Reported on 04/30/2018) 30 tablet 0 Not Taking  . ferrous sulfate (FERROUSUL) 325 (65 FE) MG tablet Take 1 tablet (325 mg total) by mouth 2 (two) times daily. (Patient not taking: Reported on 04/16/2018) 60 tablet 1 Not Taking   Allergies: has No Known Allergies. OBHx:  OB History  Gravida Para Term Preterm AB Living  4 1   1 2 1   SAB TAB Ectopic Multiple Live Births  2       1    # Outcome Date GA Lbr Len/2nd Weight Sex Delivery Anes PTL Lv  4 Current           3 Preterm 05/13/14 [redacted]w[redacted]d 35:35 / 00:40 7 lb 3.2 oz (3.265 kg) M Vag-Spont EPI  LIV     Birth Comments: WNL  2 SAB              Birth Comments: System Generated. Please review and update pregnancy details.  1 SAB            JYN:WGNFAOZH/YQMVHQIONGEX except as detailed in HPI.Marland Kitchen  No  family history of birth defects. Soc Hx: Current smoker, Alcohol: none and Recreational drug use: Marijuana and currently on Subutex.  Objective:   Vitals:   06/15/18 0136 06/15/18 0245  BP:  115/77  Resp: 18 18  Temp: 98.2 F (36.8 C)    Constitutional: Well nourished, well developed female in no acute distress.  HEENT: normal Skin: Warm and dry.  Cardiovascular: Regular rate and rhythm.   Extremity: trace to 1+ bilateral pedal edema Respiratory: Clear to auscultation bilaterally. Normal respiratory effort Abdomen: gravid, mild Back: no CVAT Neuro: Cranial nerves grossly intact Psych: Alert and Oriented x3. No memory deficits. Normal mood and affect.  MS: normal gait, normal bilateral lower extremity ROM/strength/stability.  Cervix: 2.5/80/-2 (RN  exam) Uterus: Spontaneous uterine activity, irregular Adnexa: not evaluated  EFM:FHR: 120 bpm, variability: moderate,  accelerations:  Present,  decelerations:  Present variable Toco: Frequency: Irregular and Intensity: mild   Perinatal info:  Blood type: O positive Rubella - Immune Varicella - Immune TDaP Given during third trimester of this pregnancy: 04/16/2018 RPR NR / HIV Neg/ HBsAg Neg   Assessment & Plan:   27 y.o. [id number] @ 2031w4d, Admitted on 06/15/2018 for induction of labor at term.  Admit for labor, Observe for cervical change, AROM when Appropriate and GBS status unknown, treat as needed.  Marcelyn BruinsJacelyn Neve Branscomb, CNM [facility], [facility] 06/15/2018  9:46 AM

## 2018-06-15 NOTE — Progress Notes (Signed)
Patient restless in bed; encouraged patient to relax and rest as much as possible to make continuous EFM more accurate.  Patient states she is unable to remain still.  Will continue to adjust EFM as needed to accurately identify fetal heart rate decelerations.

## 2018-06-15 NOTE — Progress Notes (Signed)
Spoke with Maryruth EveJaci Schmid, CNM via telephone regarding patient's request to go downstairs and also to discuss need for pitocin/IV. Patient came to the unit with IV saline locked, bag of pitocin unattached/not infusing. CNM recommended keeping pitocin infusing until dose is complete, maintain IV as long as the patient is cooperative with staying in the room. Discussed orders with the patient, she agreed to allow infusion of pitocin. Verbalized understanding that she would not be allowed to leave the room anyway considering she is the only person in room with infant ID armband at this time. Will continue to monitor.

## 2018-06-15 NOTE — Anesthesia Procedure Notes (Signed)
Epidural Patient location during procedure: OB Start time: 06/15/2018 12:54 PM End time: 06/15/2018 1:12 PM  Staffing Resident/CRNA: Junious SilkNoles, Bertran Zeimet, CRNA Performed: resident/CRNA   Preanesthetic Checklist Completed: patient identified, site marked, surgical consent, pre-op evaluation, timeout performed, IV checked, risks and benefits discussed and monitors and equipment checked  Epidural Patient position: sitting Prep: Betadine Patient monitoring: heart rate, continuous pulse ox and blood pressure Approach: midline Location: L3-L4 Injection technique: LOR saline  Needle:  Needle type: Tuohy  Needle gauge: 17 G Needle length: 9 cm and 9 Catheter type: closed end flexible Catheter size: 20 Guage Test dose: negative and 1.5% lidocaine with Epi 1:200 K  Assessment Sensory level: T10 Events: blood not aspirated, injection not painful, no injection resistance, negative IV test and no paresthesia  Additional Notes   Patient tolerated the insertion well without complications.Reason for block:procedure for pain

## 2018-06-15 NOTE — Progress Notes (Signed)
Patient arrived to Mother/Baby unit requesting to go downstairs. Reviewed infant safety policy to include someone with a ID band must be in the room with the infant at all times. Also explained risks of leaving the floor to include risk of bleeding, falling, etc. The patient verbalized understanding. FOB is not present at this time, however other visitors are, but only the FOB has the other ID band. Will continue to monitor.

## 2018-06-15 NOTE — Progress Notes (Signed)
   Subjective:  Doing well.  Increasingly painful contractions  Objective:   Vitals: Blood pressure 135/70, pulse 67, temperature 98.4 F (36.9 C), temperature source Oral, resp. rate 16, height 5\' 4"  (1.626 m), weight 150 lb (68 kg), last menstrual period 08/15/2017. General: NAD Abdomen: gravid, non-tender Cervical Exam: 5/100/-1  FHT: 120, moderate, +accels, no recurrent occasional variable vs earlies Toco: q1-412min  Results for orders placed or performed during the hospital encounter of 06/15/18 (from the past 24 hour(s))  Urinalysis, Routine w reflex microscopic     Status: Abnormal   Collection Time: 06/15/18  2:40 AM  Result Value Ref Range   Color, Urine YELLOW (A) YELLOW   APPearance HAZY (A) CLEAR   Specific Gravity, Urine 1.011 1.005 - 1.030   pH 6.0 5.0 - 8.0   Glucose, UA NEGATIVE NEGATIVE mg/dL   Hgb urine dipstick LARGE (A) NEGATIVE   Bilirubin Urine NEGATIVE NEGATIVE   Ketones, ur NEGATIVE NEGATIVE mg/dL   Protein, ur NEGATIVE NEGATIVE mg/dL   Nitrite NEGATIVE NEGATIVE   Leukocytes, UA MODERATE (A) NEGATIVE   RBC / HPF 0-5 0 - 5 RBC/hpf   WBC, UA 0-5 0 - 5 WBC/hpf   Bacteria, UA NONE SEEN NONE SEEN   Squamous Epithelial / LPF 6-10 0 - 5   Mucus PRESENT   Urine Drug Screen, Qualitative ([facility] only)     Status: Abnormal   Collection Time: 06/15/18  2:40 AM  Result Value Ref Range   Tricyclic, Ur Screen NONE DETECTED NONE DETECTED   Amphetamines, Ur Screen NONE DETECTED NONE DETECTED   MDMA (Ecstasy)Ur Screen NONE DETECTED NONE DETECTED   Cocaine Metabolite,Ur [place] NONE DETECTED NONE DETECTED   Opiate, Ur Screen POSITIVE (A) NONE DETECTED   Phencyclidine (PCP) Ur S NONE DETECTED NONE DETECTED   Cannabinoid 50 Ng, Ur [place] POSITIVE (A) NONE DETECTED   Barbiturates, Ur Screen (A) NONE DETECTED    Result not available. Reagent lot number recalled by manufacturer.   Benzodiazepine, Ur Scrn NONE DETECTED NONE DETECTED   Methadone Scn, Ur NONE DETECTED NONE DETECTED    CBC     Status: Abnormal   Collection Time: 06/15/18  9:43 AM  Result Value Ref Range   WBC 10.5 3.6 - 11.0 K/uL   RBC 4.29 3.80 - 5.20 MIL/uL   Hemoglobin 11.5 (L) 12.0 - 16.0 g/dL   HCT 16.133.8 (L) 09.635.0 - 04.547.0 %   MCV 78.9 (L) 80.0 - 100.0 fL   MCH 26.9 26.0 - 34.0 pg   MCHC 34.1 32.0 - 36.0 g/dL   RDW 40.926.4 (H) 81.111.5 - 91.414.5 %   Platelets 221 150 - 440 K/uL  Type and screen [facility]     Status: None   Collection Time: 06/15/18  9:43 AM  Result Value Ref Range   ABO/RH(D) O POS    Antibody Screen NEG    Sample Expiration      06/18/2018 Performed at [facility], [address] [place], [place] [address]     Assessment:   27 y.o. [id number] 2276w4d IOL category II tracing at term  Plan:   1) Labor - AROM minimal return of fluid given low stations  2) Fetus - cat I, good fetal scalp stimulation during cervical check  Vena AustriaAndreas Connie Hilgert, MD, Merlinda FrederickFACOG [facility], [facility] 06/15/2018, 12:36 PM

## 2018-06-15 NOTE — Plan of Care (Signed)
[facility] condition after uncomplicated initial recovery in L&D

## 2018-06-15 NOTE — Discharge Summary (Signed)
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 Insufficient prenatal care                         Hospital course:  Induction of Labor With Vaginal Delivery   27 y.o. yo [id number] at [redacted]w[redacted]d was admitted to the hospital 06/15/2018 for induction of labor.  Indication for induction: Favorable cervix at term and Category II fetal tracing.  Patient had an uncomplicated labor course as follows: Membrane Rupture Time/Date: 12:32 PM ,06/15/2018   Intrapartum Procedures: Episiotomy: None [1]                                         Lacerations:     Patient had delivery of a Viable infant.  Information for the patient's newborn:  Ulonda, Klosowski [[id number]]  Delivery Method: Vag-Spont   06/15/2018  Details of delivery can be found in separate delivery note. Patient's Subutex dose was confirmed with [facility]: two and one half 8 mgm tablets daily and she continued to receive that dose postpartum. She was seen by [facility] because her and her baby's UDS were positive for opioids and marijuana. CPS was notified and will follow.  Patient is discharged 06/17/2018  At which time she was tolerating a regular diet, voiding without difficulty, and having normal lochia. She was advised to follow up with [facility] for her substance treatment/ Subutex and encouraged her to make an appointment with her mental health provider [facility] in [place]).  To follow up at  [facility] to discuss contraception and for a mental health evaluation.                                      Post partum procedures:none  Complications: None  Physical exam on 06/17/2018: Vitals:   06/16/18 1951 06/16/18 2316 06/17/18 0819 06/17/18 0941  BP: 112/70 105/61 (!) 158/83 124/74  Pulse: 73 68 60 (!) 56  Resp: 18 18 18    Temp: 97.8 F (36.6 C) 97.8 F (36.6 C) 98 F (36.7 C)   TempSrc: Oral Oral Oral   SpO2: 98% 100% 100%   Weight:      Height:       General: alert, cooperative and no distress Lochia: appropriate Uterine Fundus: firm/ U-1/ML/NT Incision: N/A DVT Evaluation: No evidence of DVT seen on physical exam.  Labs: Lab Results  Component Value Date   WBC 11.2 (H) 06/16/2018   HGB 10.1 (L) 06/16/2018   HCT 30.6 (L) 06/16/2018   MCV 80.3 06/16/2018   PLT 171 06/16/2018   CMP Latest Ref Rng & Units 10/09/2017  Glucose 65 - 99 mg/dL 130(Q)  BUN 6 - 20 mg/dL 6  Creatinine 6.57 - 8.46 mg/dL 9.62  Sodium 952 - 841 mmol/L 136  Potassium 3.5 - 5.1 mmol/L 3.6  Chloride 101 - 111 mmol/L 106  CO2 22 -  32 mmol/L 25  Calcium 8.9 - 10.3 mg/dL 9.1  Total Protein 6.5 - 8.1 g/dL -  Total Bilirubin 0.3 - 1.2 mg/dL -  Alkaline Phos 38 - 161126 U/L -  AST 15 - 41 U/L -  ALT 14 - 54 U/L -    Discharge instruction: per After Visit Summary.  Medications:  Allergies as of 06/17/2018   No Known Allergies     Medication List    STOP taking these medications   ferrous sulfate 325 (65 FE) MG tablet Commonly known as:  FERROUSUL   promethazine 25 MG tablet Commonly known as:  PHENERGAN     TAKE these medications   acetaminophen 500 MG tablet Commonly known as:  TYLENOL Take 1 tablet (500 mg total) every 6 (six) hours as needed by mouth for mild pain or moderate pain.   buprenorphine 8 MG Subl SL tablet Commonly known as:  SUBUTEX Place 8 mg 2 (two) times daily under the tongue.   buprenorphine 2 MG Subl SL tablet Commonly known as:  SUBUTEX Place 4 mg under the tongue daily.   ibuprofen 600 MG tablet Commonly known as:   ADVIL,MOTRIN Take 1 tablet (600 mg total) by mouth every 6 (six) hours.   LORazepam 0.5 MG tablet Commonly known as:  ATIVAN Take 1 tablet (0.5 mg total) by mouth 2 (two) times daily as needed for up to 7 days for anxiety or sleep.   Prenatal Vitamins 0.8 MG tablet Take 1 tablet by mouth daily.       Diet: routine diet  Activity: Advance as tolerated. Pelvic rest for 6 weeks.   Outpatient follow up: Follow-up Information    Oswaldo ConroySchmid, Jacelyn Y, CNM. Schedule an appointment as soon as possible for a visit in 2 week(s).   Specialty:  Certified Nurse Midwife Contact information: [address] [place] [address] [phone]             Postpartum contraception: Undecided Rhogam Given postpartum: no Rubella vaccine given postpartum: no Varicella vaccine given postpartum: no TDaP given antepartum or postpartum: Yes, 04/16/2018  Newborn Data: Live born female / [place] Birth Weight:  8#3oz APGAR: 8, 9  Newborn Delivery   Birth date/time:  06/15/2018 15:20:00 Delivery type:  Vaginal, Spontaneous      Baby Feeding: Bottle  Disposition:will remain in hospital for eat, sleep, console  SIGNED:  Farrel ConnersColleen Shatonya Passon, CNM 06/17/2018 2:35 PM

## 2018-06-15 NOTE — Plan of Care (Signed)
Uncomplicated SVD female infant after IOL for Category 2 FHR tracing

## 2018-06-16 LAB — URINE CULTURE: Culture: <10000 — AB

## 2018-06-16 LAB — CBC
HCT: 30.6 % — ABNORMAL LOW (ref 35.0–47.0)
Hemoglobin: 10.1 g/dL — ABNORMAL LOW (ref 12.0–16.0)
MCH: 26.4 pg (ref 26.0–34.0)
MCHC: 32.9 g/dL (ref 32.0–36.0)
MCV: 80.3 fL (ref 80.0–100.0)
PLATELETS: 171 10*3/uL (ref 150–440)
RBC: 3.81 MIL/uL (ref 3.80–5.20)
RDW: 26.1 % — AB (ref 11.5–14.5)
WBC: 11.2 10*3/uL — ABNORMAL HIGH (ref 3.6–11.0)

## 2018-06-16 LAB — STREP GP B NAA: Strep Gp B NAA: NEGATIVE

## 2018-06-16 LAB — RPR: RPR: NONREACTIVE

## 2018-06-16 MED ORDER — BUPRENORPHINE HCL 2 MG SL SUBL
4.0000 mg | SUBLINGUAL_TABLET | Freq: Every day | SUBLINGUAL | Status: DC
  Administered 2018-06-16 – 2018-06-17 (×2): 4 mg via SUBLINGUAL
  Filled 2018-06-16 (×2): qty 2

## 2018-06-16 MED ORDER — LORAZEPAM 0.5 MG PO TABS
0.5000 mg | ORAL_TABLET | Freq: Two times a day (BID) | ORAL | Status: DC | PRN
  Administered 2018-06-16 – 2018-06-17 (×4): 0.5 mg via ORAL
  Filled 2018-06-16 (×4): qty 1

## 2018-06-16 MED ORDER — FERROUS SULFATE 325 (65 FE) MG PO TABS
325.0000 mg | ORAL_TABLET | Freq: Every day | ORAL | Status: DC
  Administered 2018-06-17: 325 mg via ORAL
  Filled 2018-06-16: qty 1

## 2018-06-16 NOTE — Progress Notes (Signed)
Post Partum Day 1 Subjective: Up ambulating outside to smoke. Voiding without difficulty. Tolerating a regular diet. Having some cramping. Bottlefeeding baby When I saw patient on 06/13/2018, I understood her to say that she was taking 2mg m Subutex, but not every day. She now states that she takes two and a half  8mg m Subutex daily. [facility] [place]. Last seen by provider there was 27 June. She receives a monthly RX.  Social services has not been to see patient yet. Baby will need to be observed for NAS.  Objective: Blood pressure 120/74, pulse 62, temperature 98 F (36.7 C), temperature source Oral, resp. rate 18, height 5\' 4"  (1.626 m), weight 68 kg (150 lb), last menstrual period 08/15/2017, SpO2 99 %, unknown if currently breastfeeding.  Physical Exam:   General: sleepy, in NAD Lochia: appropriate Uterine Fundus: firm/ U-1/ slightly right of midline  DVT Evaluation: No evidence of DVT seen on physical exam.  Recent Labs    06/15/18 0943 06/16/18 0712  HGB 11.5* 10.1*  HCT 33.8* 30.6*  WBC 10.5 11.2*  PLT 221 171    Assessment/Plan: Stable PPD #1-Chronic anemia-hemodynamically stable  Oral iron and vitamins Substance abuse-on Subutex, per patient is taking two and a half 8 mgm tablets daily  UDS positive for opioids and marijuana  [facility] her [facility] of [facility]ex  [id number]  Social services to see patient before discharge (UDS on baby also positive for opioids and marijuana, patient has been incarcerated this pregnancy) Plan to discharge tomorrow since baby needs to stay, unless she prefers to be discharged later today.   Addendum: Called [facility] and they confirmed the dosage of her Subutex at 2 1/2 tablets/day of the 8mg m Subutex. I also advised [facility] of her positive drug screen for opioids and marijuana.  LOS: 1 day   Farrel Connersolleen Jeannelle Wiens 06/16/2018, 9:25 AM

## 2018-06-16 NOTE — Plan of Care (Signed)

## 2018-06-16 NOTE — Clinical Social Work Note (Signed)
CPS has been notified and will assess within 72 hours. CSW is following.  Cloyd Ragas Martha Bayler Gehrig, MSW, LCSWA [phone] 

## 2018-06-16 NOTE — Clinical Social Work Maternal (Signed)
CLINICAL SOCIAL WORK MATERNAL/CHILD NOTE  Patient Details  Name: Meghan Bullock MRN: [id number] Date of Birth: [birth year]  Date:  06/16/2018  Clinical Social Worker Initiating Note:  Santiago Bumpers, MSW, [place] Date/Time: Initiated:  06/16/18/1535     Child's Name:  Meghan Bullock   Biological Parents:  Mother, Father   Need for Interpreter:  None   Reason for Referral:  Current Substance Use/Substance Use During Pregnancy    Address:  [place] [address]    Phone number:  [phone] (home)     Additional phone number: [phone]  Household Members/Support Persons (HM/SP):       HM/SP Name Relationship DOB or Age  HM/SP -1        HM/SP -2        HM/SP -3        HM/SP -4        HM/SP -5        HM/SP -6        HM/SP -7        HM/SP -8          Natural Supports (not living in the home):  Children, Community, Extended Family, Immediate Family, Spouse/significant other   Professional Supports: Other (Comment)([facility]))   Employment: Unemployed   Type of Work: None   Education:  High school graduate   Homebound arranged:    Museum/gallery curator Resources:  Medicaid   Other Resources:  Physicist, medical , [place] Considerations Which May Impact Care:  None reported  Strengths:  Ability to meet basic needs , Home prepared for child , Psychotropic Medications   Psychotropic Medications:  Subutex      Pediatrician:       Pediatrician List:   [place]      Pediatrician Fax Number:    Risk Factors/Current Problems:  Mental Health Concerns , Substance Use , Compliance with Treatment    Cognitive State:  Alert , Flight of Ideas , Poor Insight    Mood/Affect:  Animated, Apprehensive , Anxious    CSW Assessment: The CSW met with the patient at bedside to discuss her infant's positive UDS for  opioids and cannabis. The patient admitted to smoking marijuana for 4 days prior to delivery because she has a low appetite. The patient denied use of opioids; however, she does receive MAT at [facility] in [place] and is prescribed subutex which can cause a positive UDS for opioids. The patient has another child who is in the custody of the patient's grandmother and has been since her incarceration. The patient was evasive regarding her other child's age or gender.   The CSW advised the patient that because the infant is positive for substances of abuse, the CSW is mandated to report such to CPS. The patient became pressured in speech and repetitive after that discussion, insisting that she feels guilty for smoking marijuana. The CSW explained the process of reporting to CPS. In addition, the CSW confirmed that the patient is aware that the infant will remain at the nursery for 5 days for withdrawal protocol.   The CSW has placed a call to the CPS on call and is waiting for a call back. The CSW will update the chart as more information is available.  CSW Plan/Description:  Child Protective Service Report  Meghan Pho, LCSW 06/16/2018, 3:38 PM

## 2018-06-16 NOTE — Anesthesia Postprocedure Evaluation (Signed)
Anesthesia Post Note  Patient: Loa SocksSarah E Harding  Procedure(s) Performed: AN AD HOC LABOR EPIDURAL  Patient location during evaluation: Mother Baby Anesthesia Type: Epidural Level of consciousness: awake and alert Pain management: pain level controlled Vital Signs Assessment: post-procedure vital signs reviewed and stable Respiratory status: spontaneous breathing, nonlabored ventilation and respiratory function stable Cardiovascular status: stable Postop Assessment: no headache, no backache, able to ambulate and adequate PO intake Anesthetic complications: no     Last Vitals:  Vitals:   06/16/18 0755 06/16/18 1646  BP: 120/74 122/80  Pulse: 62 63  Resp: 18 18  Temp: 36.7 C 36.7 C  SpO2: 99%     Last Pain:  Vitals:   06/16/18 1646  TempSrc: Oral  PainSc:                  Lenard SimmerAndrew Tonetta Napoles

## 2018-06-17 MED ORDER — IBUPROFEN 600 MG PO TABS: 600.0000 mg | ORAL_TABLET | Freq: Four times a day (QID) | ORAL | 0 refills | Status: DC

## 2018-06-17 MED ORDER — BENZOCAINE 10 % MT GEL
Freq: Three times a day (TID) | OROMUCOSAL | Status: DC | PRN
  Filled 2018-06-17: qty 9

## 2018-06-17 MED ORDER — LORAZEPAM 0.5 MG PO TABS: 0.5000 mg | ORAL_TABLET | Freq: Two times a day (BID) | ORAL | 0 refills | Status: AC | PRN

## 2018-06-17 NOTE — Discharge Instructions (Signed)
°Vaginal Delivery, Care After °Refer to this sheet in the next few weeks. These discharge instructions provide you with information on caring for yourself after delivery. Your caregiver may also give you specific instructions. Your treatment has been planned according to the most current medical practices available, but problems sometimes occur. Call your caregiver if you have any problems or questions after you go home. °HOME CARE INSTRUCTIONS °1. Take over-the-counter or prescription medicines only as directed by your caregiver or pharmacist. °2. Do not drink alcohol, especially if you are breastfeeding or taking medicine to relieve pain. °3. Do not smoke tobacco. °4. Continue to use good perineal care. Good perineal care includes: °1. Wiping your perineum from back to front °2. Keeping your perineum clean. °3. You can do sitz baths twice a day, to help keep this area clean °5. Do not use tampons, douche or have sex for 6 weeks °6. Shower only and avoid sitting in submerged water, aside from sitz baths °7. Wear a well-fitting bra that provides breast support. °8. Eat healthy foods. °9. Drink enough fluids to keep your urine clear or pale yellow. °10. Eat high-fiber foods such as whole grain cereals and breads, brown rice, beans, and fresh fruits and vegetables every day. These foods may help prevent or relieve constipation. °11. Avoid constipation with high fiber foods or medications, such as miralax or metamucil °12. Follow your caregiver's recommendations regarding resumption of activities such as climbing stairs, driving, lifting, exercising, or traveling. °13. Talk to your caregiver about resuming sexual activities. Resumption of sexual activities after 6 weeks is dependent upon your risk of infection, your rate of healing, and your comfort and desire to resume sexual activity. °14. Try to have someone help you with your household activities and your newborn for at least a few days after you leave the  hospital. °15. Rest as much as possible. Try to rest or take a nap when your newborn is sleeping. °16. Increase your activities gradually. °17. Keep all of your scheduled postpartum appointments. It is very important to keep your scheduled follow-up appointments. At these appointments, your caregiver will be checking to make sure that you are healing physically and emotionally. °SEEK MEDICAL CARE IF:  °· You are passing large clots from your vagina. Save any clots to show your caregiver. °· You have a foul smelling discharge from your vagina. °· You have trouble urinating. °· You are urinating frequently. °· You have pain when you urinate. °· You have a change in your bowel movements. °· You have increasing redness, pain, or swelling near your vaginal incision (episiotomy) or vaginal tear. °· You have pus draining from your episiotomy or vaginal tear. °· Your episiotomy or vaginal tear is separating. °· You have painful, hard, or reddened breasts. °· You have a severe headache. °· You have blurred vision or see spots. °· You feel sad or depressed. °· You have thoughts of hurting yourself or your newborn. °· You have questions about your care, the care of your newborn, or medicines. °· You are dizzy or light-headed. °· You have a rash. °· You have nausea or vomiting. °· You were breastfeeding and have not had a menstrual period within 12 weeks after you stopped breastfeeding. °· You are not breastfeeding and have not had a menstrual period by the 12th week after delivery. °· You have a fever of 100.5 or more °SEEK IMMEDIATE MEDICAL CARE IF:  °· You have persistent pain. °· You have chest pain. °· You have shortness   of breath.  You faint.  You have leg pain.  You have stomach pain.  Your vaginal bleeding saturates two or more sanitary pads in 1 hour. MAKE SURE YOU:   Understand these instructions.  Will watch your condition.  Will get help right away if you are not doing well or get worse. Document  Released: 11/11/2000 Document Revised: 03/31/2014 Document Reviewed: 07/11/2012 [facility] Patient Information 2015 [place]. This information is not intended to replace advice given to you by your health care provider. Make sure you discuss any questions you have with your health care provider.  Sitz Bath A sitz bath is a warm water bath taken in the sitting position. The water covers only the hips and butt (buttocks). We recommend using one that fits in the toilet, to help with ease of use and cleanliness. It may be used for either healing or cleaning purposes. Sitz baths are also used to relieve pain, itching, or muscle tightening (spasms). The water may contain medicine. Moist heat will help you heal and relax.  HOME CARE  Take 3 to 4 sitz baths a day. 18. Fill the bathtub half-full with warm water. 19. Sit in the water and open the drain a little. 20. Turn on the warm water to keep the tub half-full. Keep the water running constantly. 21. Soak in the water for 15 to 20 minutes. 22. After the sitz bath, pat the affected area dry. GET HELP RIGHT AWAY IF: You get worse instead of better. Stop the sitz baths if you get worse. MAKE SURE YOU:  Understand these instructions.  Will watch your condition.  Will get help right away if you are not doing well or get worse. Document Released: 12/22/2004 Document Revised: 08/08/2012 Document Reviewed: 03/14/2011 [facility] Patient Information 2015 [place], [place]. This information is not intended to replace advice given to you by your health care provider. Make sure you discuss any questions you have with your health care provider.  [facility] and recommend making appointment with mental health provider [facility] as soon as possible

## 2018-06-17 NOTE — Progress Notes (Signed)
Patient complaining of tooth pain 10/10. Ibuprofen given. Tylenol to be given once it's sent from pharmacy. Patient requesting Orajel. RN called provider, Maebelle Munroe. Gutierrez, CNM. Provider to place order.

## 2018-06-17 NOTE — Progress Notes (Addendum)
Discharge instructions given to pt. Reiterated rooming in instructions . Pt verbalized understanding. Significant other at bedside and verbalized understanding as well. No needs or concerns expressed at this time. Pt to stay in room with infant until infant medically cleared

## 2018-06-19 LAB — URINE DRUG PANEL 7
AMPHETAMINES, URINE: NEGATIVE ng/mL
BENZODIAZEPINE QUANT UR: NEGATIVE ng/mL
Barbiturate Quant, Ur: NEGATIVE ng/mL
CANNABINOID QUANT UR: POSITIVE — AB
Cocaine (Metab.): NEGATIVE ng/mL
Opiate Quant, Ur: POSITIVE — AB
PCP Quant, Ur: NEGATIVE ng/mL

## 2018-06-29 ENCOUNTER — Inpatient Hospital Stay: Payer: Medicaid Other

## 2018-07-02 ENCOUNTER — Ambulatory Visit: Payer: Self-pay | Admitting: Oncology

## 2018-07-02 ENCOUNTER — Ambulatory Visit: Payer: Self-pay

## 2018-09-21 ENCOUNTER — Emergency Department (HOSPITAL_COMMUNITY)
Admission: EM | Admit: 2018-09-21 | Discharge: 2018-09-21 | Discharge status: Absent without leave | Payer: Medicaid Other

## 2018-09-21 ENCOUNTER — Encounter (HOSPITAL_COMMUNITY): Payer: Self-pay | Admitting: Emergency Medicine

## 2018-09-21 ENCOUNTER — Emergency Department (HOSPITAL_COMMUNITY)
Admission: EM | Admit: 2018-09-21 | Discharge: 2018-09-21 | Disposition: A | Discharge status: Home / Self Care | Payer: Medicaid Other | Attending: Emergency Medicine | Admitting: Emergency Medicine

## 2018-09-21 DIAGNOSIS — F17210 Nicotine dependence, cigarettes, uncomplicated: Secondary | ICD-10-CM | POA: Insufficient documentation

## 2018-09-21 DIAGNOSIS — Z79899 Other long term (current) drug therapy: Secondary | ICD-10-CM | POA: Diagnosis not present

## 2018-09-21 DIAGNOSIS — F1110 Opioid abuse, uncomplicated: Secondary | ICD-10-CM | POA: Diagnosis present

## 2018-09-21 DIAGNOSIS — F1190 Opioid use, unspecified, uncomplicated: Secondary | ICD-10-CM

## 2018-09-21 NOTE — ED Triage Notes (Signed)
Pt requesting detox from heroin and Suboxone. Reports her last use was earlier today.

## 2018-09-21 NOTE — Discharge Instructions (Signed)
Stop using heroin. Use suboxone as prescribed by your doctor  Go to detox center to get help   Return to ER if you have thoughts of harming yourself or others, hallucinations

## 2018-09-21 NOTE — ED Provider Notes (Signed)
[facility]-EMERGENCY DEPT Provider Note   CSN: [id number] Arrival date & time: 09/21/18  1719     History   Chief Complaint Chief Complaint  Patient presents with  . detox    HPI Meghan Bullock is a 27 y.o. female hx of bipolar, polysubstance abuse here presenting with request for detox.  Patient states that she is under a lot of stress recently.  She states that she has a young child at home when she is undergoing a possible divorce.  She states that she relapsed and used heroin earlier today.  She also has a prescription of Suboxone and is set up to go to [facility] next week.  She wanted to get some help today. Denies any suicidal or homicidal ideations or hallucinations    The history is provided by the patient.    Past Medical History:  Diagnosis Date  . Anemia   . Anxiety   . Bipolar 1 disorder (HCC)   . Chlamydia   . Hepatitis   . Heroin abuse (HCC)   . Mononucleosis   . Ovarian cyst   . Polysubstance abuse [facility])     Patient Active Problem List   Diagnosis Date Noted  . Normal labor 06/17/2018  . Postpartum care following vaginal delivery 06/17/2018  . Encounter for induction of labor 06/15/2018  . Iron deficiency anemia 04/30/2018  . Supervision of high risk pregnancy, antepartum 10/25/2017  . Substance abuse affecting pregnancy, antepartum 10/25/2017  . H/O preterm delivery, currently pregnant, first trimester 10/25/2017  . Hepatitis C 10/23/2017  . Preterm premature rupture of membranes (PPROM) with unknown onset of labor 05/12/2014  . Encephalopathy, toxic 10/03/2013  . Benzodiazepine abuse (HCC) 10/03/2013  . Heroin abuse [facility])     Past Surgical History:  Procedure Laterality Date  . NO PAST SURGERIES       OB History    Gravida  4   Para  2   Term  1   Preterm  1   AB  2   Living  2     SAB  2   TAB      Ectopic      Multiple  0   Live Births  2            Home Medications    Prior to  Admission medications   Medication Sig Start Date End Date Taking? Authorizing Provider  acetaminophen (TYLENOL) 500 MG tablet Take 1 tablet (500 mg total) every 6 (six) hours as needed by mouth for mild pain or moderate pain. Patient not taking: Reported on 04/30/2018 10/09/17   Antony Madura, PA-C  buprenorphine (SUBUTEX) 2 MG SUBL SL tablet Place 4 mg under the tongue daily.    [provider]  buprenorphine (SUBUTEX) 8 MG SUBL SL tablet Place 8 mg 2 (two) times daily under the tongue.    [provider]  ibuprofen (ADVIL,MOTRIN) 600 MG tablet Take 1 tablet (600 mg total) by mouth every 6 (six) hours. 06/17/18   Farrel Conners, CNM  Prenatal Multivit-Min-Fe-FA (PRENATAL VITAMINS) 0.8 MG tablet Take 1 tablet by mouth daily. 12/06/17   Natale Milch, MD    Family History No family history on file.  Social History Social History   Tobacco Use  . Smoking status: Current Every Day Smoker    Packs/day: 1.00    Years: 5.00    Pack years: 5.00    Types: Cigarettes  . Smokeless tobacco: Never Used  Substance Use  Topics  . Alcohol use: No  . Drug use: Yes    Types: IV, Heroin    Comment:  heroin       Allergies   Patient has no known allergies.   Review of Systems Review of Systems  Psychiatric/Behavioral: The patient is nervous/anxious.   All other systems reviewed and are negative.    Physical Exam Updated Vital Signs BP (!) 150/107 (BP Location: Right Arm)   Pulse 88   Temp 97.7 F (36.5 C) (Oral)   Resp 20   Ht 5\' 4"  (1.626 m)   Wt 59.9 kg   SpO2 97%   BMI 22.66 kg/m   Physical Exam  Constitutional: She is oriented to person, place, and time. She appears well-developed and well-nourished.  HENT:  Head: Normocephalic.  Eyes: Pupils are equal, round, and reactive to light. Conjunctivae and EOM are normal.  Neck: Normal range of motion. Neck supple.  Cardiovascular: Normal rate, regular rhythm and normal heart sounds.  Pulmonary/Chest:  Effort normal and breath sounds normal. No stridor. No respiratory distress.  Abdominal: Soft. Bowel sounds are normal. She exhibits no distension. There is no tenderness.  Musculoskeletal: Normal range of motion.  Multiple injection marks on the arms, no obvious cellulitis or abscess   Neurological: She is alert and oriented to person, place, and time.  Skin: Skin is warm. Capillary refill takes less than 2 seconds.  Psychiatric: She has a normal mood and affect.  Nursing note and vitals reviewed.    ED Treatments / Results  Labs (all labs ordered are listed, but only abnormal results are displayed) Labs Reviewed - No data to display  EKG None  Radiology No results found.  Procedures Procedures (including critical care time)  Medications Ordered in ED Medications - No data to display   Initial Impression / Assessment and Plan / ED Course  I have reviewed the triage vital signs and the nursing notes.  Pertinent labs & imaging results that were available during my care of the patient were reviewed by me and considered in my medical decision making (see chart for details).    KIOWA PEIFER is a 27 y.o. female here with request for detox. Patient recently used heroin but is alert and oriented. Patient has suboxone prescription already and has follow up with [facility]. Told her that we don't do detox here and will give her list of detox centers. Not suicidal or homicidal. Stable for discharge    Final Clinical Impressions(s) / ED Diagnoses   Final diagnoses:  None    ED Discharge Orders    None       Charlynne Pander, MD 09/21/18 1815

## 2018-09-21 NOTE — ED Notes (Signed)
Pt adds that she already arranged to get in with [facility] on Tuesday.

## 2018-09-21 NOTE — ED Notes (Signed)
Made Dr Silverio Lay aware of pt's complaint of detox request. Will see patient in triage.

## 2018-11-09 ENCOUNTER — Encounter: Payer: Self-pay | Admitting: Emergency Medicine

## 2018-11-09 ENCOUNTER — Other Ambulatory Visit: Payer: Self-pay

## 2018-11-09 ENCOUNTER — Emergency Department
Admission: EM | Admit: 2018-11-09 | Discharge: 2018-11-09 | Disposition: A | Discharge status: Home / Self Care | Payer: Medicaid Other | Attending: Emergency Medicine | Admitting: Emergency Medicine

## 2018-11-09 DIAGNOSIS — L03011 Cellulitis of right finger: Secondary | ICD-10-CM | POA: Insufficient documentation

## 2018-11-09 DIAGNOSIS — F17210 Nicotine dependence, cigarettes, uncomplicated: Secondary | ICD-10-CM | POA: Insufficient documentation

## 2018-11-09 MED ORDER — SULFAMETHOXAZOLE-TRIMETHOPRIM 800-160 MG PO TABS: 1.0000 | ORAL_TABLET | Freq: Two times a day (BID) | ORAL | 0 refills | Status: DC

## 2018-11-09 MED ORDER — SULFAMETHOXAZOLE-TRIMETHOPRIM 800-160 MG PO TABS
1.0000 | ORAL_TABLET | Freq: Once | ORAL | Status: AC
  Administered 2018-11-09: 1 via ORAL
  Filled 2018-11-09: qty 1

## 2018-11-09 MED ORDER — LIDOCAINE 5 % EX PTCH
MEDICATED_PATCH | CUTANEOUS | Status: AC
  Administered 2018-11-09: 1 via TRANSDERMAL
  Filled 2018-11-09: qty 1

## 2018-11-09 MED ORDER — LIDOCAINE 5 % EX PTCH
1.0000 | MEDICATED_PATCH | CUTANEOUS | Status: DC
  Administered 2018-11-09: 1 via TRANSDERMAL

## 2018-11-09 MED ORDER — LIDOCAINE-EPINEPHRINE-TETRACAINE (LET) SOLUTION
3.0000 mL | Freq: Once | NASAL | Status: AC
  Administered 2018-11-09: 3 mL via TOPICAL
  Filled 2018-11-09: qty 3

## 2018-11-09 NOTE — ED Provider Notes (Signed)
[facility] Emergency Department Provider Note   ____________________________________________   First MD Initiated Contact with Patient 11/09/18 1616     (approximate)  I have reviewed the triage vital signs and the nursing notes.   HISTORY  Chief Complaint Hand Pain    HPI Meghan Bullock is a 27 y.o. female patient presents with pain and swelling to the right index finger.  Patient denies provocative incident for complaint.  Patient pain is increased in the past 2 days.  Patient rates the pain as a 10/10.  No palliative measure for complaint.  Patient described pain is "achy".  Patient is requesting a dose of Suboxone for pain due to her substance abuse.   Past Medical History:  Diagnosis Date  . Anemia   . Anxiety   . Bipolar 1 disorder (HCC)   . Chlamydia   . Hepatitis   . Heroin abuse (HCC)   . Mononucleosis   . Ovarian cyst   . Polysubstance abuse [facility](HCC)     Patient Active Problem List   Diagnosis Date Noted  . Normal labor 06/17/2018  . Postpartum care following vaginal delivery 06/17/2018  . Encounter for induction of labor 06/15/2018  . Iron deficiency anemia 04/30/2018  . Supervision of high risk pregnancy, antepartum 10/25/2017  . Substance abuse affecting pregnancy, antepartum 10/25/2017  . H/O preterm delivery, currently pregnant, first trimester 10/25/2017  . Hepatitis C 10/23/2017  . Preterm premature rupture of membranes (PPROM) with unknown onset of labor 05/12/2014  . Encephalopathy, toxic 10/03/2013  . Benzodiazepine abuse (HCC) 10/03/2013  . Heroin abuse [facility](HCC)     Past Surgical History:  Procedure Laterality Date  . NO PAST SURGERIES      Prior to Admission medications   Medication Sig Start Date End Date Taking? Authorizing Provider  acetaminophen (TYLENOL) 500 MG tablet Take 1 tablet (500 mg total) every 6 (six) hours as needed by mouth for mild pain or moderate pain. Patient not taking: Reported on 04/30/2018  10/09/17   Antony MaduraHumes, Kelly, PA-C  buprenorphine (SUBUTEX) 2 MG SUBL SL tablet Place 4 mg under the tongue daily.    [provider]  buprenorphine (SUBUTEX) 8 MG SUBL SL tablet Place 8 mg 2 (two) times daily under the tongue.    [provider]  ibuprofen (ADVIL,MOTRIN) 600 MG tablet Take 1 tablet (600 mg total) by mouth every 6 (six) hours. 06/17/18   Farrel ConnersGutierrez, Colleen, CNM  Prenatal Multivit-Min-Fe-FA (PRENATAL VITAMINS) 0.8 MG tablet Take 1 tablet by mouth daily. 12/06/17   Schuman, Jaquelyn Bitterhristanna R, MD  sulfamethoxazole-trimethoprim (BACTRIM DS,SEPTRA DS) 800-160 MG tablet Take 1 tablet by mouth 2 (two) times daily. 11/09/18   Joni ReiningSmith, Felcia Huebert K, PA-C    Allergies Patient has no known allergies.  No family history on file.  Social History Social History   Tobacco Use  . Smoking status: Current Every Day Smoker    Packs/day: 1.00    Years: 5.00    Pack years: 5.00    Types: Cigarettes  . Smokeless tobacco: Never Used  Substance Use Topics  . Alcohol use: No  . Drug use: Yes    Types: IV, Heroin    Comment:  heroin      Review of Systems  Constitutional: No fever/chills Eyes: No visual changes. ENT: No sore throat. Cardiovascular: Denies chest pain. Respiratory: Denies shortness of breath. Gastrointestinal: No abdominal pain.  No nausea, no vomiting.  No diarrhea.  No constipation. Genitourinary: Negative for dysuria. Musculoskeletal: Negative for  back pain. Skin: Redness and swelling to the medial nailbed of the second digit right hand.  Neurological: Negative for headaches, focal weakness or numbness. Psychiatric:Anxiety, bipolar, and polysubstance abuse. Endocrine:Hepatitis ____________________________________________   PHYSICAL EXAM:  VITAL SIGNS: ED Triage Vitals [11/09/18 1609]  Enc Vitals Group     BP 137/85     Pulse Rate 79     Resp 18     Temp 97.9 F (36.6 C)     Temp Source Oral     SpO2 100 %     Weight 140 lb (63.5 kg)     Height 5'  4" (1.626 m)     Head Circumference      Peak Flow      Pain Score 10     Pain Loc      Pain Edu?      Excl. in GC?    Constitutional: Alert and oriented. Well appearing and in no acute distress. Cardiovascular: Normal rate, regular rhythm. Grossly normal heart sounds.  Good peripheral circulation. Respiratory: Normal respiratory effort.  No retractions. Lungs CTAB. Skin: Edema and erythema medial nailbed second digit right hand.   Psychiatric: Mood and affect are normal. Speech and behavior are normal.  ____________________________________________   LABS (all labs ordered are listed, but only abnormal results are displayed)  Labs Reviewed - No data to display ____________________________________________  EKG   ____________________________________________  RADIOLOGY  ED MD interpretation:    Official radiology report(s): No results found.  ____________________________________________   PROCEDURES  Procedure(s) performed: None  .Marland KitchenIncision and Drainage Date/Time: 11/09/2018 5:11 PM Performed by: Joni Reining, PA-C Authorized by: Joni Reining, PA-C   Consent:    Consent obtained:  Verbal   Consent given by:  Parent   Risks discussed:  Bleeding, incomplete drainage, pain and infection Location:    Type:  Abscess   Location:  Upper extremity   Upper extremity location:  Finger   Finger location:  R index finger Pre-procedure details:    Skin preparation:  Betadine Anesthesia (see MAR for exact dosages):    Anesthesia method:  Topical application   Topical anesthetic:  LET Procedure type:    Complexity:  Simple Procedure details:    Needle aspiration: no     Incision types:  Stab incision   Incision depth:  Dermal   Scalpel blade:  11   Wound management:  Probed and deloculated   Drainage amount:  Scant   Wound treatment:  Wound left open   Packing materials:  None Post-procedure details:    Patient tolerance of procedure:  Tolerated well, no  immediate complications    Critical Care performed: No  ____________________________________________   INITIAL IMPRESSION / ASSESSMENT AND PLAN / ED COURSE  As part of my medical decision making, I reviewed the following data within the electronic MEDICAL RECORD NUMBER    Patient presents with with edema and erythema to the medial aspect of second digit right hand.  Lesion was incised and drained.  Area was clean and a strip of Lidoderm was applied for pressure dressing.  Patient given discharge care instructions and a prescription for Bactrim DS.  Advised patient we do not give cefoxitin for pain control for this procedure.  Patient advised to [facility] with [facility].      ____________________________________________   FINAL CLINICAL IMPRESSION(S) / ED DIAGNOSES  Final diagnoses:  Paronychia of finger of right hand     ED Discharge Orders  Ordered    sulfamethoxazole-trimethoprim (BACTRIM DS,SEPTRA DS) 800-160 MG tablet  2 times daily     11/09/18 1704           Note:  This document was prepared using Dragon voice recognition software and may include unintentional dictation errors.    Joni Reining, PA-C 11/09/18 1715    Sharyn Creamer, MD 11/09/18 2129

## 2018-11-09 NOTE — ED Triage Notes (Signed)
Presents with pain and some swelling to right index finger  Denies any injury

## 2018-11-09 NOTE — Discharge Instructions (Signed)
Follow discharge care instruction.  Apply strips of Lidoderm every 12 hours to site.  Take antibiotics as directed.

## 2018-11-22 ENCOUNTER — Other Ambulatory Visit: Payer: Self-pay

## 2018-11-22 ENCOUNTER — Emergency Department (HOSPITAL_COMMUNITY)
Admission: EM | Admit: 2018-11-22 | Discharge: 2018-11-22 | Disposition: A | Discharge status: Home / Self Care | Payer: Medicaid Other | Attending: Emergency Medicine | Admitting: Emergency Medicine

## 2018-11-22 ENCOUNTER — Encounter (HOSPITAL_COMMUNITY): Payer: Self-pay | Admitting: *Deleted

## 2018-11-22 DIAGNOSIS — G8929 Other chronic pain: Secondary | ICD-10-CM

## 2018-11-22 DIAGNOSIS — J111 Influenza due to unidentified influenza virus with other respiratory manifestations: Secondary | ICD-10-CM

## 2018-11-22 DIAGNOSIS — F17210 Nicotine dependence, cigarettes, uncomplicated: Secondary | ICD-10-CM | POA: Insufficient documentation

## 2018-11-22 DIAGNOSIS — R69 Illness, unspecified: Secondary | ICD-10-CM

## 2018-11-22 DIAGNOSIS — M79641 Pain in right hand: Secondary | ICD-10-CM | POA: Insufficient documentation

## 2018-11-22 MED ORDER — BENZONATATE 100 MG PO CAPS: 100.0000 mg | ORAL_CAPSULE | Freq: Three times a day (TID) | ORAL | 0 refills | Status: DC

## 2018-11-22 MED ORDER — KETOROLAC TROMETHAMINE 60 MG/2ML IM SOLN
15.0000 mg | Freq: Once | INTRAMUSCULAR | Status: AC
  Administered 2018-11-22: 15 mg via INTRAMUSCULAR
  Filled 2018-11-22: qty 2

## 2018-11-22 MED ORDER — OXYCODONE HCL 5 MG PO TABS
5.0000 mg | ORAL_TABLET | Freq: Once | ORAL | Status: AC
  Administered 2018-11-22: 5 mg via ORAL
  Filled 2018-11-22: qty 1

## 2018-11-22 MED ORDER — BENZONATATE 100 MG PO CAPS
100.0000 mg | ORAL_CAPSULE | Freq: Once | ORAL | Status: AC
  Administered 2018-11-22: 100 mg via ORAL
  Filled 2018-11-22: qty 1

## 2018-11-22 NOTE — ED Provider Notes (Signed)
[facility] EMERGENCY DEPARTMENT Provider Note   CSN: [id number] Arrival date & time: 11/22/18  1432     History   Chief Complaint Chief Complaint  Patient presents with  . Generalized Body Aches  . Shortness of Breath  . Hand Pain    HPI Meghan Bullock is a 27 y.o. female.  27 yo F with a chief complaint of cough congestion fever.  Is been going on for the past week or so.  Her son has a similar illness.  She is been feeling aches all over worse in her low back.  She is also been having pain to her right hand which she had had an injury to surgery performed.  She felt the pain had gotten somewhat better but with this illness she thought that had worsened.  She denies repeat injury to the hand.  The history is provided by the patient.  Shortness of Breath  Pertinent negatives include no fever, no headaches, no rhinorrhea, no wheezing, no chest pain, no vomiting and no abdominal pain.  Hand Pain  Associated symptoms include shortness of breath. Pertinent negatives include no chest pain, no abdominal pain and no headaches.  Illness  This is a new problem. The current episode started more than 2 days ago. The problem occurs constantly. The problem has not changed since onset.Associated symptoms include shortness of breath. Pertinent negatives include no chest pain, no abdominal pain and no headaches. Nothing aggravates the symptoms. Nothing relieves the symptoms. She has tried nothing for the symptoms. The treatment provided no relief.    Past Medical History:  Diagnosis Date  . Anemia   . Anxiety   . Bipolar 1 disorder (HCC)   . Chlamydia   . Hepatitis   . Heroin abuse (HCC)   . Mononucleosis   . Ovarian cyst   . Polysubstance abuse [facility](HCC)     Patient Active Problem List   Diagnosis Date Noted  . Normal labor 06/17/2018  . Postpartum care following vaginal delivery 06/17/2018  . Encounter for induction of labor 06/15/2018  . Iron deficiency  anemia 04/30/2018  . Supervision of high risk pregnancy, antepartum 10/25/2017  . Substance abuse affecting pregnancy, antepartum 10/25/2017  . H/O preterm delivery, currently pregnant, first trimester 10/25/2017  . Hepatitis C 10/23/2017  . Preterm premature rupture of membranes (PPROM) with unknown onset of labor 05/12/2014  . Encephalopathy, toxic 10/03/2013  . Benzodiazepine abuse (HCC) 10/03/2013  . Heroin abuse [facility](HCC)     Past Surgical History:  Procedure Laterality Date  . HAND SURGERY    . NO PAST SURGERIES       OB History    Gravida  4   Para  2   Term  1   Preterm  1   AB  2   Living  2     SAB  2   TAB      Ectopic      Multiple  0   Live Births  2            Home Medications    Prior to Admission medications   Medication Sig Start Date End Date Taking? Authorizing Provider  Phenylephrine-APAP-guaiFENesin (MUCINEX FAST-MAX) 10-650-400 MG/20ML LIQD Take 10 mLs by mouth every 6 (six) hours as needed (for symptoms).   Yes [provider]  promethazine (PHENERGAN) 25 MG tablet Take 25 mg by mouth every 8 (eight) hours as needed for nausea or vomiting.    Yes [provider]  Pseudoephedrine-APAP-DM (DAYQUIL MULTI-SYMPTOM PO) Take 5-10 mLs by mouth every 6 (six) hours as needed (for cold/flu-like symptoms).   Yes [provider]  acetaminophen (TYLENOL) 500 MG tablet Take 1 tablet (500 mg total) every 6 (six) hours as needed by mouth for mild pain or moderate pain. Patient not taking: Reported on 11/22/2018 10/09/17   Antony Madura, PA-C  benzonatate (TESSALON) 100 MG capsule Take 1 capsule (100 mg total) by mouth every 8 (eight) hours. 11/22/18   Melene Plan, DO  ibuprofen (ADVIL,MOTRIN) 600 MG tablet Take 1 tablet (600 mg total) by mouth every 6 (six) hours. Patient not taking: Reported on 11/22/2018 06/17/18   Farrel Conners, CNM  Prenatal Multivit-Min-Fe-FA (PRENATAL VITAMINS) 0.8 MG tablet Take 1 tablet by mouth  daily. Patient not taking: Reported on 11/22/2018 12/06/17   Natale Milch, MD  sulfamethoxazole-trimethoprim (BACTRIM DS,SEPTRA DS) 800-160 MG tablet Take 1 tablet by mouth 2 (two) times daily. Patient not taking: Reported on 11/22/2018 11/09/18   Joni Reining, PA-C    Family History No family history on file.  Social History Social History   Tobacco Use  . Smoking status: Current Every Day Smoker    Packs/day: 1.00    Years: 5.00    Pack years: 5.00    Types: Cigarettes  . Smokeless tobacco: Never Used  Substance Use Topics  . Alcohol use: No  . Drug use: Not Currently    Types: IV, Heroin    Comment:  heroin       Allergies   Acetaminophen   Review of Systems Review of Systems  Constitutional: Negative for chills and fever.  HENT: Negative for congestion and rhinorrhea.   Eyes: Negative for redness and visual disturbance.  Respiratory: Positive for shortness of breath. Negative for wheezing.   Cardiovascular: Negative for chest pain and palpitations.  Gastrointestinal: Negative for abdominal pain, nausea and vomiting.  Genitourinary: Negative for dysuria and urgency.  Musculoskeletal: Negative for arthralgias and myalgias.  Skin: Negative for pallor and wound.  Neurological: Negative for dizziness and headaches.     Physical Exam Updated Vital Signs BP 111/83 (BP Location: Right Arm)   Pulse (!) 110   Temp (!) 97.4 F (36.3 C) (Oral)   Resp 16   Ht 5\' 4"  (1.626 m)   Wt 68 kg   LMP 11/20/2018 (Exact Date)   SpO2 98%   BMI 25.75 kg/m   Physical Exam Vitals signs and nursing note reviewed.  Constitutional:      General: She is not in acute distress.    Appearance: She is well-developed. She is not diaphoretic.  HENT:     Head: Normocephalic and atraumatic.     Comments: Swollen turbinates, posterior nasal drip, no noted sinus ttp, tm normal bilaterally.   Eyes:     Pupils: Pupils are equal, round, and reactive to light.  Neck:      Musculoskeletal: Normal range of motion and neck supple.  Cardiovascular:     Rate and Rhythm: Regular rhythm. Tachycardia present.     Heart sounds: No murmur. No friction rub. No gallop.   Pulmonary:     Effort: Pulmonary effort is normal.     Breath sounds: No wheezing or rales.  Abdominal:     General: There is no distension.     Palpations: Abdomen is soft.     Tenderness: There is no abdominal tenderness.  Musculoskeletal:        General: No tenderness.  Skin:    General:  Skin is warm and dry.  Neurological:     Mental Status: She is alert and oriented to person, place, and time.  Psychiatric:        Behavior: Behavior normal.      ED Treatments / Results  Labs (all labs ordered are listed, but only abnormal results are displayed) Labs Reviewed - No data to display  EKG None  Radiology No results found.  Procedures Procedures (including critical care time)  Medications Ordered in ED Medications  ketorolac (TORADOL) injection 15 mg (15 mg Intramuscular Given 11/22/18 1724)  oxyCODONE (Oxy IR/ROXICODONE) immediate release tablet 5 mg (5 mg Oral Given 11/22/18 1724)  benzonatate (TESSALON) capsule 100 mg (100 mg Oral Given 11/22/18 1724)     Initial Impression / Assessment and Plan / ED Course  I have reviewed the triage vital signs and the nursing notes.  Pertinent labs & imaging results that were available during my care of the patient were reviewed by me and considered in my medical decision making (see chart for details).     27 yo F with a chief complaint of an upper respiratory illness.  Going on for the past week.  She is mildly tachycardic but otherwise is well-appearing and nontoxic.  She clinically appears to be intoxicated which may be the cause of her tachycardia.  She denies doing any illegal drugs today.  Has been on Suboxone previously.  We will give Tylenol and Toradol.  Prescription for cough medicine.  Have her follow-up with her PCP.  6:36  PM:  I have discussed the diagnosis/risks/treatment options with the patient and family and believe the pt to be eligible for discharge home to follow-up with PCP. We also discussed returning to the ED immediately if new or worsening sx occur. We discussed the sx which are most concerning (e.g., sudden worsening pain, fever, inability to tolerate by mouth) that necessitate immediate return. Medications administered to the patient during their visit and any new prescriptions provided to the patient are listed below.  Medications given during this visit Medications  ketorolac (TORADOL) injection 15 mg (15 mg Intramuscular Given 11/22/18 1724)  oxyCODONE (Oxy IR/ROXICODONE) immediate release tablet 5 mg (5 mg Oral Given 11/22/18 1724)  benzonatate (TESSALON) capsule 100 mg (100 mg Oral Given 11/22/18 1724)      The patient appears reasonably screen and/or stabilized for discharge and I doubt any other medical condition or other [facility] requiring further screening, evaluation, or treatment in the ED at this time prior to discharge.    Final Clinical Impressions(s) / ED Diagnoses   Final diagnoses:  Chronic hand pain, right  Influenza-like illness    ED Discharge Orders         Ordered    benzonatate (TESSALON) 100 MG capsule  Every 8 hours     11/22/18 1718           Melene PlanFloyd, Mairlyn Tegtmeyer, DO 11/22/18 1836

## 2018-11-22 NOTE — ED Notes (Signed)
Patient verbalizes understanding of discharge instructions. Opportunity for questioning and answers were provided. 
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
## 2018-11-22 NOTE — Discharge Instructions (Signed)
Follow up with your family doc.   Take tylenol 2 pills 4 times a day and motrin 4 pills 3 times a day.  Drink plenty of fluids.  Return for worsening shortness of breath, headache, confusion. Follow up with your family doctor.

## 2018-12-05 ENCOUNTER — Encounter (HOSPITAL_COMMUNITY): Payer: Self-pay | Admitting: Emergency Medicine

## 2018-12-05 ENCOUNTER — Emergency Department (HOSPITAL_COMMUNITY)
Admission: EM | Admit: 2018-12-05 | Discharge: 2018-12-05 | Disposition: A | Discharge status: Home / Self Care | Payer: Self-pay | Attending: Emergency Medicine | Admitting: Emergency Medicine

## 2018-12-05 ENCOUNTER — Other Ambulatory Visit: Payer: Self-pay

## 2018-12-05 DIAGNOSIS — K047 Periapical abscess without sinus: Secondary | ICD-10-CM | POA: Insufficient documentation

## 2018-12-05 DIAGNOSIS — F17210 Nicotine dependence, cigarettes, uncomplicated: Secondary | ICD-10-CM | POA: Insufficient documentation

## 2018-12-05 MED ORDER — CLINDAMYCIN HCL 150 MG PO CAPS: 300.0000 mg | ORAL_CAPSULE | Freq: Four times a day (QID) | ORAL | 0 refills | Status: DC

## 2018-12-05 MED ORDER — KETOROLAC TROMETHAMINE 30 MG/ML IJ SOLN
30.0000 mg | Freq: Once | INTRAMUSCULAR | Status: AC
  Administered 2018-12-05: 30 mg via INTRAMUSCULAR
  Filled 2018-12-05: qty 1

## 2018-12-05 MED ORDER — CLINDAMYCIN HCL 150 MG PO CAPS
450.0000 mg | ORAL_CAPSULE | Freq: Once | ORAL | Status: AC
  Administered 2018-12-05: 450 mg via ORAL
  Filled 2018-12-05: qty 3

## 2018-12-05 NOTE — ED Notes (Addendum)
Patient verbalizes understanding of discharge instructions. Opportunity for questioning and answers were provided. Armband removed by staff, pt discharged from ED ambulatory. Pt did not want to stay for 15 minutes after IM injection given.

## 2018-12-05 NOTE — ED Triage Notes (Signed)
Pt states she has a toothache on the upper left side of her mouth. Swelling on left side of face. Pain 7/10. Pt eating during triage. States she thinks she needs antibiotics

## 2018-12-05 NOTE — ED Provider Notes (Signed)
[facility] Note   CSN: [id number] Arrival date & time: 12/05/18  1545     History   Chief Complaint Chief Complaint  Patient presents with  . tooth pain/abscess    HPI Meghan Bullock is a 28 y.o. female.  The history is provided by the patient and medical records. No language interpreter was used.   Meghan Bullock is a 28 y.o. female  with a PMH as listed below who presents to the Emergency Department complaining of left upper dental pain.  Patient states that her symptoms have been waxing and waning over the last month, but this morning when she awoke, her upper right tooth was doing much more than usual and she noticed significant amount of facial swelling.  She states swelling has been somewhat decreasing throughout the day with salt water gargles, Motrin and applying ice to her face.  She denies any fever, chills, difficulty breathing or difficulty swallowing.    Past Medical History:  Diagnosis Date  . Anemia   . Anxiety   . Bipolar 1 disorder (HCC)   . Chlamydia   . Hepatitis   . Heroin abuse (HCC)   . Mononucleosis   . Ovarian cyst   . Polysubstance abuse [facility](HCC)     Patient Active Problem List   Diagnosis Date Noted  . Normal labor 06/17/2018  . Postpartum care following vaginal delivery 06/17/2018  . Encounter for induction of labor 06/15/2018  . Iron deficiency anemia 04/30/2018  . Supervision of high risk pregnancy, antepartum 10/25/2017  . Substance abuse affecting pregnancy, antepartum 10/25/2017  . H/O preterm delivery, currently pregnant, first trimester 10/25/2017  . Hepatitis C 10/23/2017  . Preterm premature rupture of membranes (PPROM) with unknown onset of labor 05/12/2014  . Encephalopathy, toxic 10/03/2013  . Benzodiazepine abuse (HCC) 10/03/2013  . Heroin abuse [facility](HCC)     Past Surgical History:  Procedure Laterality Date  . HAND SURGERY    . NO PAST SURGERIES       OB History    Gravida  4   Para  2   Term  1   Preterm  1   AB  2   Living  2     SAB  2   TAB      Ectopic      Multiple  0   Live Births  2            Home Medications    Prior to Admission medications   Medication Sig Start Date End Date Taking? Authorizing Provider  acetaminophen (TYLENOL) 500 MG tablet Take 1 tablet (500 mg total) every 6 (six) hours as needed by mouth for mild pain or moderate pain. Patient not taking: Reported on 11/22/2018 10/09/17   Antony MaduraHumes, Kelly, PA-C  benzonatate (TESSALON) 100 MG capsule Take 1 capsule (100 mg total) by mouth every 8 (eight) hours. 11/22/18   Melene PlanFloyd, Dan, DO  clindamycin (CLEOCIN) 150 MG capsule Take 2 capsules (300 mg total) by mouth 4 (four) times daily. 12/05/18   Kariann Wecker, Chase PicketJaime Pilcher, PA-C  ibuprofen (ADVIL,MOTRIN) 600 MG tablet Take 1 tablet (600 mg total) by mouth every 6 (six) hours. Patient not taking: Reported on 11/22/2018 06/17/18   Farrel ConnersGutierrez, Colleen, CNM  Phenylephrine-APAP-guaiFENesin [facility](MUCINEX FAST-MAX) [phone] MG/20ML LIQD Take 10 mLs by mouth every 6 (six) hours as needed (for symptoms).    [provider]  Prenatal Multivit-Min-Fe-FA (PRENATAL VITAMINS) 0.8 MG tablet Take 1 tablet by mouth daily.  Patient not taking: Reported on 11/22/2018 12/06/17   Natale Milch, MD  promethazine (PHENERGAN) 25 MG tablet Take 25 mg by mouth every 8 (eight) hours as needed for nausea or vomiting.     [provider]  Pseudoephedrine-APAP-DM (DAYQUIL MULTI-SYMPTOM PO) Take 5-10 mLs by mouth every 6 (six) hours as needed (for cold/flu-like symptoms).    [provider]  sulfamethoxazole-trimethoprim (BACTRIM DS,SEPTRA DS) 800-160 MG tablet Take 1 tablet by mouth 2 (two) times daily. Patient not taking: Reported on 11/22/2018 11/09/18   Joni Reining, PA-C    Family History No family history on file.  Social History Social History   Tobacco Use  . Smoking status: Current Every Day Smoker    Packs/day:  1.00    Years: 5.00    Pack years: 5.00    Types: Cigarettes  . Smokeless tobacco: Never Used  Substance Use Topics  . Alcohol use: No  . Drug use: Not Currently    Types: IV, Heroin    Comment:  heroin       Allergies   Acetaminophen   Review of Systems Review of Systems  Constitutional: Negative for chills and fever.  HENT: Positive for dental problem. Negative for sore throat and trouble swallowing.   Respiratory: Negative for shortness of breath.   Skin: Negative for color change.     Physical Exam Updated Vital Signs BP 122/64   Pulse 90   Temp 98.7 F (37.1 C) (Oral)   Resp 16   LMP 11/20/2018 (Exact Date)   SpO2 99%   Physical Exam Vitals signs and nursing note reviewed.  Constitutional:      General: She is not in acute distress.    Appearance: She is well-developed.  HENT:     Head: Normocephalic and atraumatic.     Mouth/Throat:      Comments: Dental cavities and poor oral dentition noted. Left facial swelling with faint erythema to the left cheek. No periorbital swelling/erythema.  Pain along tooth as depicted in image. Midline uvula. No trismus. OP moist and clear. No oropharyngeal erythema or edema. Neck supple with no tenderness.  Neck:     Musculoskeletal: Neck supple.  Cardiovascular:     Rate and Rhythm: Normal rate and regular rhythm.     Heart sounds: Normal heart sounds. No murmur.  Pulmonary:     Effort: Pulmonary effort is normal. No respiratory distress.     Breath sounds: Normal breath sounds. No wheezing or rales.  Musculoskeletal: Normal range of motion.  Skin:    General: Skin is warm and dry.  Neurological:     Mental Status: She is alert.      ED Treatments / Results  Labs (all labs ordered are listed, but only abnormal results are displayed) Labs Reviewed - No data to display  EKG None  Radiology No results found.  Procedures Procedures (including critical care time)  Medications Ordered in ED Medications    clindamycin (CLEOCIN) capsule 450 mg (450 mg Oral Given 12/05/18 1618)  ketorolac (TORADOL) 30 MG/ML injection 30 mg (30 mg Intramuscular Given 12/05/18 1618)     Initial Impression / Assessment and Plan / ED Course  I have reviewed the triage vital signs and the nursing notes.  Pertinent labs & imaging results that were available during my care of the patient were reviewed by me and considered in my medical decision making (see chart for details).    Meghan Bullock is a 28 y.o. female who  presents to ED for dental pain which has been waxing and waning for the last month, but acutely worsened this morning.  No overt abscess requiring incision and drainage.  She is afebrile, nontoxic-appearing and swallowing secretions well.  Exam not concerning for Ludwick's angina or pharyngeal abscess.  She does have mild erythema to the cheek and facial edema concerning for start of a facial cellulitis.  Initially, I recommended CT scan for further evaluation.  Patient states that she really does not feel like waiting very long and thought she was just going to get antibiotics.  Discussed reasoning for CT scan as well as alternative of very strict return precautions.  Patient does not want to stay in the emergency department for further imaging, however does understand reasons to return.  We specifically discussed that if she should develop a fever, difficulty breathing or swallowing she must return to the emergency department immediately.  We also discussed that should her symptoms worsen despite the start of antibiotics, she should also promptly return to the emergency department for further care.  She verbalized understanding and agreement with this plan.Will treat with clindamycin. I provided dental resource guide and stressed the importance of dental follow up for ultimate management of dental pain.  Patient seen by and discussed with Dr. Pilar PlateBero who agrees with treatment plan.    Final Clinical  Impressions(s) / ED Diagnoses   Final diagnoses:  Dental infection    ED Discharge Orders         Ordered    clindamycin (CLEOCIN) 150 MG capsule  4 times daily     12/05/18 1618           Nikita Surman, Chase PicketJaime Pilcher, PA-C 12/05/18 1628    Sabas SousBero, Michael M, MD 12/07/18 1256

## 2018-12-05 NOTE — Discharge Instructions (Signed)
You have a dental infection. It is very important that you get evaluated by a dentist as soon as possible. Call tomorrow to schedule an appointment. Tylenol or Ibuprofen as needed for pain. Take your full course of antibiotics. Read the instructions below.  Eat a soft or liquid diet and rinse your mouth out after meals with warm water. You should see a dentist or return here at once if you have increased swelling, increased pain or uncontrolled bleeding from the site of your injury.  AS WE DISCUSSED, IT IS VERY IMPORTANT THAT YOU RETURN TO ER IMMEDIATELY IF YOU ARE GETTING WORSE INSTEAD OF BETTER! YOU SHOULD ALSO RETURN IF YOU DEVELOP A FEVER, DIFFICULTY SWALLOWING, DIFFICULTY BREATHING OR IF YOU HAVE ANY ADDITIONAL CONCERNS.

## 2019-01-14 ENCOUNTER — Emergency Department
Admission: EM | Admit: 2019-01-14 | Discharge: 2019-01-14 | Disposition: A | Discharge status: Home / Self Care | Payer: Self-pay | Attending: Emergency Medicine | Admitting: Emergency Medicine

## 2019-01-14 ENCOUNTER — Other Ambulatory Visit: Payer: Self-pay

## 2019-01-14 ENCOUNTER — Encounter: Payer: Self-pay | Admitting: Emergency Medicine

## 2019-01-14 DIAGNOSIS — F1120 Opioid dependence, uncomplicated: Secondary | ICD-10-CM | POA: Insufficient documentation

## 2019-01-14 DIAGNOSIS — Z5321 Procedure and treatment not carried out due to patient leaving prior to being seen by health care provider: Secondary | ICD-10-CM | POA: Insufficient documentation

## 2019-01-14 NOTE — ED Triage Notes (Signed)
States withdrawing from heroine. Jumping around in wheelchair, anxious.

## 2019-01-14 NOTE — ED Notes (Signed)
Patient has been sitting in wheelchair by mom or walking in lobby. No resp distress. Color wnl. Registration states mom came to desk and states are leaving without being seen.

## 2019-01-14 NOTE — ED Notes (Signed)
Per NT, Vet. Pt mother came up to desk and stated that pt was leaving due to agitation. PT in NAD

## 2019-01-24 ENCOUNTER — Encounter (HOSPITAL_COMMUNITY): Payer: Self-pay | Admitting: Emergency Medicine

## 2019-01-24 ENCOUNTER — Emergency Department (HOSPITAL_COMMUNITY)
Admission: EM | Admit: 2019-01-24 | Discharge: 2019-01-24 | Disposition: A | Discharge status: Home / Self Care | Payer: Self-pay | Attending: Emergency Medicine | Admitting: Emergency Medicine

## 2019-01-24 ENCOUNTER — Ambulatory Visit (HOSPITAL_COMMUNITY)
Admission: EM | Admit: 2019-01-24 | Discharge: 2019-01-24 | Disposition: A | Discharge status: Home / Self Care | Payer: Self-pay | Attending: Emergency Medicine | Admitting: Emergency Medicine

## 2019-01-24 DIAGNOSIS — R2232 Localized swelling, mass and lump, left upper limb: Secondary | ICD-10-CM | POA: Insufficient documentation

## 2019-01-24 DIAGNOSIS — L02419 Cutaneous abscess of limb, unspecified: Secondary | ICD-10-CM

## 2019-01-24 DIAGNOSIS — Z5321 Procedure and treatment not carried out due to patient leaving prior to being seen by health care provider: Secondary | ICD-10-CM | POA: Insufficient documentation

## 2019-01-24 NOTE — ED Triage Notes (Signed)
Pt has abscess to left wrist from IV drug use. Site is red/swollen.

## 2019-01-24 NOTE — Discharge Instructions (Addendum)
I am concerned that you have a more extensive infection that goes beyond the skin.  I am concerned that it could be going into your joint.  Go to the [facility] ED for possible specialty evaluation and further work-up.

## 2019-01-24 NOTE — ED Notes (Signed)
Patient called for room with no response x 2

## 2019-01-24 NOTE — ED Notes (Signed)
Patient called back to room after triage.  Patient states "I'm on a phone call, take the next patient please".

## 2019-01-24 NOTE — ED Provider Notes (Signed)
HPI  SUBJECTIVE:  Meghan Bullock is a right-handed 28 y.o. female who presents with a painful erythematous mass of gradually increasing size on the dorsum of her left wrist starting 2 days ago after using some IV heroin.  Reports constant throbbing pain and soreness.  States that she is unable to use her wrist, and that the redness is streaking up her arm.  reports draining pus.  No fevers, body aches, numbness or tingling in fingers.  She reports limitation of motion of the wrist.  She has tried cool compresses without improvement in her symptoms.  Symptoms are worse with use and hanging her wrist in a dependent position.  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  No history of diabetes, artificial joints or artificial heart valves.  LMP: End of January.  Denies the possibility of being pregnant.  PMD: None.  Past Medical History:  Diagnosis Date  . Anemia   . Anxiety   . Bipolar 1 disorder (HCC)   . Chlamydia   . Hepatitis   . Heroin abuse (HCC)   . Mononucleosis   . Ovarian cyst   . Polysubstance abuse [facility])     Past Surgical History:  Procedure Laterality Date  . HAND SURGERY    . NO PAST SURGERIES      History reviewed. No pertinent family history.  Social History   Tobacco Use  . Smoking status: Current Every Day Smoker    Packs/day: 1.00    Years: 5.00    Pack years: 5.00    Types: Cigarettes  . Smokeless tobacco: Never Used  Substance Use Topics  . Alcohol use: No  . Drug use: Not Currently    Types: IV, Heroin    Comment:  heroin      No current facility-administered medications for this encounter.   Current Outpatient Medications:  .  methadone (DOLOPHINE) 10 MG tablet, Take 10 mg by mouth every 8 (eight) hours., Disp: , Rfl:  .  clindamycin (CLEOCIN) 150 MG capsule, Take 2 capsules (300 mg total) by mouth 4 (four) times daily., Disp: 56 capsule, Rfl: 0 .  ibuprofen (ADVIL,MOTRIN) 600 MG tablet, Take 1 tablet (600 mg  total) by mouth every 6 (six) hours. (Patient not taking: Reported on 11/22/2018), Disp: 30 tablet, Rfl: 0 .  promethazine (PHENERGAN) 25 MG tablet, Take 25 mg by mouth every 8 (eight) hours as needed for nausea or vomiting. , Disp: , Rfl:  .  Pseudoephedrine-APAP-DM (DAYQUIL MULTI-SYMPTOM PO), Take 5-10 mLs by mouth every 6 (six) hours as needed (for cold/flu-like symptoms)., Disp: , Rfl:   Allergies  Allergen Reactions  . Acetaminophen Itching     ROS  As noted in HPI.   Physical Exam  BP 128/76 (BP Location: Right Arm)   Pulse 89   Temp 99.1 F (37.3 C) (Oral)   Resp 18   LMP 12/02/2018   SpO2 98%   Breastfeeding No   Constitutional: Well developed, well nourished, no acute distress Eyes:  EOMI, conjunctiva normal bilaterally HENT: Normocephalic, atraumatic,mucus membranes moist Respiratory: Normal inspiratory effort Cardiovascular: Normal rate GI: nondistended skin: No rash, skin intact Musculoskeletal: 5 x 4 cm tender area of his temperature, erythema with a central eschar dorsum of left wrist.  Marked area of erythema with a marker for reference.  No expressible purulent drainage.  Positive swelling over the hand.  Positive tenderness extending up the forearm and over the dorsum of the hand.  Tenderness along the  ulnar aspect of the wrist.  Pain with ulnar deviation, flexion.  No pain with radial deviation, extension.  No pain with pronation, supination.  Patient neurovascularly intact in the median/radial/ulnar distribution.  RP 2+.        Neurologic: Alert & oriented x 3, no focal neuro deficits Psychiatric: Speech and behavior appropriate   ED Course   Medications - No data to display  No orders of the defined types were placed in this encounter.   No results found for this or any previous visit (from the past 24 hour(s)). No results found.  ED Clinical Impression  Abscess, wrist   ED Assessment/Plan  Patient with a wrist abscess.  Concern for it  extending into the joint.  She may also have a superficial thrombophlebitis.  Do not think that oral antibiotics will take care of this.  Transferring to the emergency department for possible specialty evaluation and further treatment.  Feel that she is stable to go by private vehicle.  Discussed MDM, rationale for transfer to the emergency department with the patient.  She agrees with plan.   No orders of the defined types were placed in this encounter.   *This clinic note was created using Dragon dictation software. Therefore, there may be occasional mistakes despite careful proofreading.   ?   Domenick Gong, MD 01/24/19 [phone]

## 2019-01-24 NOTE — ED Triage Notes (Signed)
PT C/O: abscess/cellulitis on left hand d/t heroin use    Reports she finally got into the methadone clinic this morning seeking help  Sx today include: swelling of hand and pain  A&O x4... NAD... Ambulatory

## 2019-01-25 ENCOUNTER — Encounter (HOSPITAL_COMMUNITY): Payer: Self-pay

## 2019-01-25 ENCOUNTER — Emergency Department (HOSPITAL_COMMUNITY)
Admission: EM | Admit: 2019-01-25 | Discharge: 2019-01-25 | Disposition: A | Discharge status: Home / Self Care | Payer: Self-pay | Attending: Emergency Medicine | Admitting: Emergency Medicine

## 2019-01-25 DIAGNOSIS — L02414 Cutaneous abscess of left upper limb: Secondary | ICD-10-CM | POA: Insufficient documentation

## 2019-01-25 DIAGNOSIS — F419 Anxiety disorder, unspecified: Secondary | ICD-10-CM | POA: Insufficient documentation

## 2019-01-25 DIAGNOSIS — F17210 Nicotine dependence, cigarettes, uncomplicated: Secondary | ICD-10-CM | POA: Insufficient documentation

## 2019-01-25 DIAGNOSIS — F1110 Opioid abuse, uncomplicated: Secondary | ICD-10-CM | POA: Insufficient documentation

## 2019-01-25 DIAGNOSIS — F319 Bipolar disorder, unspecified: Secondary | ICD-10-CM | POA: Insufficient documentation

## 2019-01-25 DIAGNOSIS — L03114 Cellulitis of left upper limb: Secondary | ICD-10-CM | POA: Insufficient documentation

## 2019-01-25 DIAGNOSIS — L0291 Cutaneous abscess, unspecified: Secondary | ICD-10-CM

## 2019-01-25 MED ORDER — SULFAMETHOXAZOLE-TRIMETHOPRIM 800-160 MG PO TABS: 2.0000 | ORAL_TABLET | Freq: Two times a day (BID) | ORAL | 0 refills | Status: AC

## 2019-01-25 MED ORDER — SULFAMETHOXAZOLE-TRIMETHOPRIM 800-160 MG PO TABS: 2.0000 | ORAL_TABLET | Freq: Two times a day (BID) | ORAL | 0 refills | Status: DC

## 2019-01-25 MED ORDER — LIDOCAINE HCL 2 % IJ SOLN
20.0000 mL | Freq: Once | INTRAMUSCULAR | Status: AC
  Administered 2019-01-25: 400 mg
  Filled 2019-01-25: qty 20

## 2019-01-25 NOTE — ED Notes (Signed)
Patient left without her prescription.

## 2019-01-25 NOTE — Discharge Instructions (Signed)
Return here in 2 days for a recheck or sooner for any worsening in your condition. Use warm compresses around the area.

## 2019-01-25 NOTE — ED Provider Notes (Signed)
[facility] EMERGENCY DEPARTMENT Provider Note   CSN: [id number] Arrival date & time: 01/25/19  0757    History   Chief Complaint Chief Complaint  Patient presents with  . Abscess    HPI Meghan Bullock is a 28 y.o. female.     HPI Patient presents to the emergency department with an abscess to the distal dorsal forearm.  The patient states that she injected heroin in the region 4 days ago.  The patient states she was seen in urgent care and was sent to the emergency department last night but refused to wait.  The patient states that she came back this morning for evaluation.  Patient states that she has had no fevers, nausea, vomiting, weakness, dizziness, numbness or syncope.  She states that palpation of the area makes the pain worse. Past Medical History:  Diagnosis Date  . Anemia   . Anxiety   . Bipolar 1 disorder (HCC)   . Chlamydia   . Hepatitis   . Heroin abuse (HCC)   . Mononucleosis   . Ovarian cyst   . Polysubstance abuse [facility])     Patient Active Problem List   Diagnosis Date Noted  . Normal labor 06/17/2018  . Postpartum care following vaginal delivery 06/17/2018  . Encounter for induction of labor 06/15/2018  . Iron deficiency anemia 04/30/2018  . Supervision of high risk pregnancy, antepartum 10/25/2017  . Substance abuse affecting pregnancy, antepartum 10/25/2017  . H/O preterm delivery, currently pregnant, first trimester 10/25/2017  . Hepatitis C 10/23/2017  . Preterm premature rupture of membranes (PPROM) with unknown onset of labor 05/12/2014  . Encephalopathy, toxic 10/03/2013  . Benzodiazepine abuse (HCC) 10/03/2013  . Heroin abuse [facility])     Past Surgical History:  Procedure Laterality Date  . HAND SURGERY    . NO PAST SURGERIES       OB History    Gravida  4   Para  2   Term  1   Preterm  1   AB  2   Living  2     SAB  2   TAB      Ectopic      Multiple  0   Live Births  2             Home Medications    Prior to Admission medications   Medication Sig Start Date End Date Taking? Authorizing Provider  clindamycin (CLEOCIN) 150 MG capsule Take 2 capsules (300 mg total) by mouth 4 (four) times daily. 12/05/18   Ward, Chase Picket, PA-C  ibuprofen (ADVIL,MOTRIN) 600 MG tablet Take 1 tablet (600 mg total) by mouth every 6 (six) hours. Patient not taking: Reported on 11/22/2018 06/17/18   Farrel Conners, CNM  methadone (DOLOPHINE) 10 MG tablet Take 10 mg by mouth every 8 (eight) hours.    [provider]  promethazine (PHENERGAN) 25 MG tablet Take 25 mg by mouth every 8 (eight) hours as needed for nausea or vomiting.     [provider]  Pseudoephedrine-APAP-DM (DAYQUIL MULTI-SYMPTOM PO) Take 5-10 mLs by mouth every 6 (six) hours as needed (for cold/flu-like symptoms).    [provider]    Family History No family history on file.  Social History Social History   Tobacco Use  . Smoking status: Current Every Day Smoker    Packs/day: 1.00    Years: 5.00    Pack years: 5.00    Types: Cigarettes  . Smokeless tobacco: Never  Used  Substance Use Topics  . Alcohol use: No  . Drug use: Not Currently    Types: IV, Heroin    Comment:  heroin       Allergies   Acetaminophen   Review of Systems Review of Systems All other systems negative except as documented in the HPI. All pertinent positives and negatives as reviewed in the HPI.  Physical Exam Updated Vital Signs BP 117/85   Pulse 68   Temp 98.6 F (37 C) (Oral)   Resp 14   SpO2 98%   Physical Exam Vitals signs and nursing note reviewed.  Constitutional:      General: She is not in acute distress.    Appearance: She is well-developed.  HENT:     Head: Normocephalic and atraumatic.  Eyes:     Pupils: Pupils are equal, round, and reactive to light.  Pulmonary:     Effort: Pulmonary effort is normal.  Musculoskeletal:     Left wrist: She exhibits normal range of  motion, no tenderness, no bony tenderness, no swelling and no effusion.       Arms:  Skin:    General: Skin is warm and dry.  Neurological:     Mental Status: She is alert and oriented to person, place, and time.      ED Treatments / Results  Labs (all labs ordered are listed, but only abnormal results are displayed) Labs Reviewed - No data to display  EKG None  Radiology No results found.  Procedures Procedures (including critical care time)  Medications Ordered in ED Medications  lidocaine (XYLOCAINE) 2 % (with pres) injection 400 mg (has no administration in time range)     Initial Impression / Assessment and Plan / ED Course  I have reviewed the triage vital signs and the nursing notes.  Pertinent labs & imaging results that were available during my care of the patient were reviewed by me and considered in my medical decision making (see chart for details).        INCISION AND DRAINAGE Performed by: Jamesetta Orleans Halana Deisher Consent: Verbal consent obtained. Risks and benefits: risks, benefits and alternatives were discussed Type: abscess  Body area: L forearm proximal to wrist  Anesthesia: local infiltration  Incision was made with a scalpel.  Local anesthetic: lidocaine 2% wo epinephrine  Anesthetic total: 6 ml  Complexity: complex Blunt dissection to break up loculations  Drainage: purulent  Drainage amount: moderate  Packing material: 1/4 in iodoform gauze  Patient tolerance: Patient tolerated the procedure well with no immediate complications.  I advised patient she will need to be rechecked at the 2-day mark.  Have advised to return here as needed.  Was given antibiotics but left before getting the hardcopy so I sent a copy to the pharmacy.   Final Clinical Impressions(s) / ED Diagnoses   Final diagnoses:  None    ED Discharge Orders    None       Charlestine Night, Cordelia Poche 01/25/19 1530    Jacalyn Lefevre, MD 01/25/19 [phone]

## 2019-01-25 NOTE — ED Notes (Signed)
Patient went out the building and returned- stating "I went to do something-Im grown dont ask me why I left."  She was educated on the importance of staying in the department for optimum care. Family at bedside

## 2019-01-25 NOTE — ED Triage Notes (Signed)
Pt presents for evaluation of abscess to L forearm from IV drug use. Pt states was sent from [facility].

## 2019-02-18 ENCOUNTER — Emergency Department (HOSPITAL_COMMUNITY)
Admission: EM | Admit: 2019-02-18 | Discharge: 2019-02-18 | Disposition: A | Discharge status: Home / Self Care | Payer: Self-pay | Attending: Emergency Medicine | Admitting: Emergency Medicine

## 2019-02-18 ENCOUNTER — Encounter (HOSPITAL_COMMUNITY): Payer: Self-pay

## 2019-02-18 ENCOUNTER — Other Ambulatory Visit: Payer: Self-pay

## 2019-02-18 ENCOUNTER — Emergency Department (HOSPITAL_COMMUNITY): Payer: Self-pay

## 2019-02-18 DIAGNOSIS — R2232 Localized swelling, mass and lump, left upper limb: Secondary | ICD-10-CM | POA: Insufficient documentation

## 2019-02-18 DIAGNOSIS — F319 Bipolar disorder, unspecified: Secondary | ICD-10-CM | POA: Insufficient documentation

## 2019-02-18 DIAGNOSIS — Z79899 Other long term (current) drug therapy: Secondary | ICD-10-CM | POA: Insufficient documentation

## 2019-02-18 DIAGNOSIS — F419 Anxiety disorder, unspecified: Secondary | ICD-10-CM | POA: Insufficient documentation

## 2019-02-18 DIAGNOSIS — F17210 Nicotine dependence, cigarettes, uncomplicated: Secondary | ICD-10-CM | POA: Insufficient documentation

## 2019-02-18 DIAGNOSIS — F1910 Other psychoactive substance abuse, uncomplicated: Secondary | ICD-10-CM | POA: Insufficient documentation

## 2019-02-18 DIAGNOSIS — F1110 Opioid abuse, uncomplicated: Secondary | ICD-10-CM | POA: Insufficient documentation

## 2019-02-18 MED ORDER — NALOXONE HCL 2 MG/2ML IJ SOSY: 1.0000 mg | PREFILLED_SYRINGE | Freq: Once | INTRAMUSCULAR | Status: DC

## 2019-02-18 NOTE — ED Notes (Signed)
Patient verbalized permission for this RN to sign for discharge due to bloody sores that have been picked on hands.

## 2019-02-18 NOTE — ED Notes (Signed)
PT STATES SHE IS HOMELESS AND SLEPT IN THE RAIN LAST SEVERAL DAYS. PT STATES SHE HAS NO WHERE TO GO. PEOPLE ON THE STREET WANTED HER TO SELL HERSELF FOR 20 BUCKS. SHE SCARED TO DEATH TO GO BACK. PT IS CRYING.  PT TALKING TO CHILDREN WHO ARE NOT THERE. PT REACTING TO EXTERNAL STIMULI

## 2019-02-18 NOTE — ED Notes (Signed)
Patient stating "I need my rocks back because it is epsom salt that I use to soak my hands in every night." Patient informed that belongings are labeled, in cabinet, and will be returned upon discharge with the exception of illegal drugs collected by security/[employer] earlier.

## 2019-02-18 NOTE — ED Provider Notes (Signed)
[facility] Note   CSN: [id number] Arrival date & time: 02/18/19  1742    History   Chief Complaint Chief Complaint  Patient presents with  . Medical Clearance  . Altered Mental Status  . Drug Overdose   LEVEL 5 CAVEAT HPI Meghan Bullock is a 28 y.o. female with PMHx of heroin abuse and bipolar disorder who was brought in by EMS for drug overdose. Per nurse [place] and [place] in for overdose. While in the ED pt went to the bathroom to undress and get wanded; she was in the bathroom for several minutes and later found by staff sleeping while standing up with a needle in her hand. Other drugs were found in pt's wallet. Pt reports that she took fentanyl although she is unsure if it was fentanyl or heroin. Denies SI or HI.        Past Medical History:  Diagnosis Date  . Anemia   . Anxiety   . Bipolar 1 disorder (HCC)   . Chlamydia   . Hepatitis   . Heroin abuse (HCC)   . Mononucleosis   . Ovarian cyst   . Polysubstance abuse [facility])     Patient Active Problem List   Diagnosis Date Noted  . Normal labor 06/17/2018  . Postpartum care following vaginal delivery 06/17/2018  . Encounter for induction of labor 06/15/2018  . Iron deficiency anemia 04/30/2018  . Supervision of high risk pregnancy, antepartum 10/25/2017  . Substance abuse affecting pregnancy, antepartum 10/25/2017  . H/O preterm delivery, currently pregnant, first trimester 10/25/2017  . Hepatitis C 10/23/2017  . Preterm premature rupture of membranes (PPROM) with unknown onset of labor 05/12/2014  . Encephalopathy, toxic 10/03/2013  . Benzodiazepine abuse (HCC) 10/03/2013  . Heroin abuse [facility])     Past Surgical History:  Procedure Laterality Date  . HAND SURGERY    . NO PAST SURGERIES       OB History    Gravida  4   Para  2   Term  1   Preterm  1   AB  2   Living  2     SAB  2   TAB      Ectopic      Multiple  0   Live Births  2            Home Medications    Prior to Admission medications   Medication Sig Start Date End Date Taking? Authorizing Provider  SUBOXONE 8-2 MG FILM Place 2.25 Film under the tongue daily. 10/27/18  Yes [provider]  clindamycin (CLEOCIN) 150 MG capsule Take 2 capsules (300 mg total) by mouth 4 (four) times daily. Patient not taking: Reported on 02/18/2019 12/05/18   Ward, Chase Picket, PA-C  ibuprofen (ADVIL,MOTRIN) 600 MG tablet Take 1 tablet (600 mg total) by mouth every 6 (six) hours. Patient not taking: Reported on 11/22/2018 06/17/18   Farrel Conners, CNM  methadone (DOLOPHINE) 10 MG tablet Take 10 mg by mouth every 8 (eight) hours.    [provider]    Family History No family history on file.  Social History Social History   Tobacco Use  . Smoking status: Current Every Day Smoker    Packs/day: 1.00    Years: 5.00    Pack years: 5.00    Types: Cigarettes  . Smokeless tobacco: Never Used  Substance Use Topics  . Alcohol use: No  .  Drug use: Not Currently    Types: IV, Heroin    Comment:  heroin       Allergies   Acetaminophen   Review of Systems Review of Systems  Unable to perform ROS: Mental status change  Psychiatric/Behavioral: Negative for suicidal ideas.     Physical Exam Updated Vital Signs BP (!) 143/107 (BP Location: Left Arm)   Pulse 89   Temp 98.8 F (37.1 C) (Oral)   Resp 18   Ht 5\' 4"  (1.626 m)   Wt 77.1 kg   SpO2 96%   BMI 29.18 kg/m   Physical Exam Vitals signs and nursing note reviewed.  Constitutional:      Appearance: She is not ill-appearing.  HENT:     Head: Normocephalic and atraumatic.  Eyes:     Conjunctiva/sclera: Conjunctivae normal.  Neck:     Musculoskeletal: Neck supple.  Cardiovascular:     Rate and Rhythm: Normal rate and regular rhythm.     Pulses: Normal pulses.  Pulmonary:     Effort: Pulmonary effort is normal.     Breath sounds: Normal breath  sounds.  Abdominal:     Palpations: Abdomen is soft.     Tenderness: There is no abdominal tenderness.  Skin:    General: Skin is warm and dry.     Comments: 2 x 2 cm area of induration and erythema to left dorsal forearm. Multiple excoriations and 2nd degree burn to right dorsal hand. No other abscesses appreciated.   Neurological:     Mental Status: She is alert.      ED Treatments / Results  Labs (all labs ordered are listed, but only abnormal results are displayed) Labs Reviewed - No data to display  EKG None  Radiology Dg Forearm Left  Result Date: 02/18/2019 CLINICAL DATA:  Left forearm abscess.  Intravenous drug abuse are EXAM: LEFT FOREARM - 2 VIEW COMPARISON:  None. FINDINGS: Frontal and lateral views obtained. No radiopaque foreign body or soft tissue air. No fracture or dislocation. Joint spaces appear normal. No erosive change or bony destruction. There is a small exostosis arising from the volar aspect of the distal humerus, an anatomic variant. IMPRESSION: No bony abnormality. No arthropathy. No soft tissue air or radiopaque foreign body. Electronically Signed   By: Bretta BangWilliam  Woodruff III M.D.   On: 02/18/2019 19:13    Procedures Procedures (including critical care time)  Medications Ordered in ED Medications - No data to display   Initial Impression / Assessment and Plan / ED Course  I have reviewed the triage vital signs and the nursing notes.  Pertinent labs & imaging results that were available during my care of the patient were reviewed by me and considered in my medical decision making (see chart for details).    Pt presents for drug overdose; [place] dep[place] after she was unresponsive. On arrival pt went straight back to the bathroom; after some time found in bathroom passed out over the sink with a needle in her hand. [employer] also found drug paraphernalia in pt's wallet; unsure what type of drug it is. Pt denies SI or HI. She is not IVC; does  not need to be medically cleared at this point; do not feel pt needs labs. Has what appears to be area of induration on left forearm; will get x ray of forearm to ensure there is no needle in her skin. No fluctuance. Reports pt fell a couple of days ago onto this  arm. Pt also states she was being picked up by grandparents to go to a facility tonight for rehab; will attempt to get into contact with family member to gain more information. Pt is alert and talking; satting > 95% currently. Will continue to monitor; placed on end tidal CO2 monitor.   6:42 PM Jodi Geralds, PA-C got into contact with pt's grandmother who is concerned about pt being discharged tonight as she gets combative with grandmother when she attempts to take her to a facility. Jodi Geralds spoke with [facility] facility as well who is unaware of pt; they report pt can call at 9 PM to talk with counselor prior to getting bed placement. Discussed this with pt who would like to speak with [facility] as well. Will get phone numbers for both and continue to monitor O2 saturation. Nursing staff informed that pt has refused end tidal CO2.   9:17 PM Reevaluated pt; she is currently scratching her right hand causing it to bleed. Nursing staff has been aware of this and gave her gauze for the area. Pt is currently requesting food including cheese. I again gave her the numbers of both [facility] and [facility] and told pt she needs to call for bed placement. She states she will call once she is finished with her food; states "you cannot rush me out of here." Will reevaluate in 30 minutes. Satting well.   9:36 PM Jodi Geralds, PA-C went to evaluate pt; she is in agreement to call [facility] in the morning for placement. Pt's grandmother is going to pick her up tonight. She is stable for discharge at this time; her oxygen saturation has remained >95%. She is more alert and oriented now than when she first came in.        Final Clinical Impressions(s) / ED Diagnoses   Final  diagnoses:  Polysubstance abuse [facility])    ED Discharge Orders    None       Tanda Rockers, PA-C 02/18/19 2157    Virgina Norfolk, DO 02/18/19 2202

## 2019-02-18 NOTE — Discharge Instructions (Addendum)
You were seen today for polysubstance abuse and monitored accordingly  Please call [facility] in the morning for placement; they are expecting your call  Please attempt to not scratch your right hand; you were given bandages to keep the area closed

## 2019-02-18 NOTE — ED Notes (Signed)
EDPA Provider at bedside. MADE AWARE OF PT'S DECLINE TO WEAR ANYTHING ON HER FACE

## 2019-02-18 NOTE — ED Notes (Signed)
PT DECLINES TO WEAR ANYTHING ON HER FACE. PT REQUESTS SOMETHING TO EAT AND A PHONE

## 2019-02-18 NOTE — ED Notes (Signed)
Upon arrival pt went straight to bathroom. NT knocked on door and there was no response. Pt had door locked and continued to knock on door. NT finally entered bathroom and pt was passed out over the sink with her pants around her ankles and her face in the mirror. Pt also had a needle in her hand. When asking pt if she had shot up pt denied. NT searched her belongings and found crack rocks and pills in wallet. Pt was asked to change out immediately and all belongings were collected and checked out by security and [employer]. Pt is now dressed out and in a bed. Pt has been wanded by security.

## 2019-04-01 ENCOUNTER — Emergency Department (HOSPITAL_COMMUNITY)
Admission: EM | Admit: 2019-04-01 | Discharge: 2019-04-01 | Disposition: A | Discharge status: Home / Self Care | Payer: Self-pay | Attending: Emergency Medicine | Admitting: Emergency Medicine

## 2019-04-01 ENCOUNTER — Other Ambulatory Visit: Payer: Self-pay

## 2019-04-01 DIAGNOSIS — Z5321 Procedure and treatment not carried out due to patient leaving prior to being seen by health care provider: Secondary | ICD-10-CM | POA: Insufficient documentation

## 2019-04-01 DIAGNOSIS — R2242 Localized swelling, mass and lump, left lower limb: Secondary | ICD-10-CM | POA: Insufficient documentation

## 2019-04-01 NOTE — ED Triage Notes (Signed)
Pt in bathroom

## 2019-04-01 NOTE — ED Notes (Signed)
Attempted to retrieve patient from waiting room, no answer/response when name called.  Provider made aware.

## 2019-04-01 NOTE — ED Triage Notes (Signed)
Pt in with swelling to L foot and abcesses to bil lower arms, pt reports injecting opioids to arms, pt reports last use yesterday, pt reports taking Suboxone, pt lethargic and easily irritable, A&O x4

## 2019-04-05 ENCOUNTER — Other Ambulatory Visit: Payer: Self-pay

## 2019-04-05 ENCOUNTER — Emergency Department (HOSPITAL_COMMUNITY)
Admission: EM | Admit: 2019-04-05 | Discharge: 2019-04-05 | Discharge status: Against Medical Advice | Payer: Self-pay | Attending: Emergency Medicine | Admitting: Emergency Medicine

## 2019-04-05 ENCOUNTER — Encounter (HOSPITAL_COMMUNITY): Payer: Self-pay

## 2019-04-05 DIAGNOSIS — F1990 Other psychoactive substance use, unspecified, uncomplicated: Secondary | ICD-10-CM | POA: Insufficient documentation

## 2019-04-05 DIAGNOSIS — L02413 Cutaneous abscess of right upper limb: Secondary | ICD-10-CM | POA: Insufficient documentation

## 2019-04-05 DIAGNOSIS — Z5320 Procedure and treatment not carried out because of patient's decision for unspecified reasons: Secondary | ICD-10-CM | POA: Insufficient documentation

## 2019-04-05 DIAGNOSIS — L02414 Cutaneous abscess of left upper limb: Secondary | ICD-10-CM | POA: Insufficient documentation

## 2019-04-05 DIAGNOSIS — F17210 Nicotine dependence, cigarettes, uncomplicated: Secondary | ICD-10-CM | POA: Insufficient documentation

## 2019-04-05 DIAGNOSIS — F319 Bipolar disorder, unspecified: Secondary | ICD-10-CM | POA: Insufficient documentation

## 2019-04-05 DIAGNOSIS — L0291 Cutaneous abscess, unspecified: Secondary | ICD-10-CM

## 2019-04-05 NOTE — ED Notes (Signed)
Pt seen walking out of ED lobby

## 2019-04-05 NOTE — ED Provider Notes (Signed)
[facility]   CSN: [id number] Arrival date & time: 04/05/19  1332    History   Chief Complaint Chief Complaint  Patient presents with  . Abscess    HPI [name] is a 28 y.o. female with a hx of tobacco abuse, anxiety, bipolar 1 disorder, polysubstance abuse including IVDU, & hepatitis C who presents to the ED for evaluation of abscesses to the bilateral wrists areas x 1 week. She reports abscesses began around the same time, initially progressively worsened, now improved some after she applied pressure w/ drainage to each area. Current discomfort is a 5/10 in severity. Worse w/ movement & pressure. Denies fever, chills, emesis, numbness, or weakness. Has injected drugs into each of these locations multiple times, unsure of exact last time.     HPI  Past Medical History:  Diagnosis Date  . Anemia   . Anxiety   . Bipolar 1 disorder (HCC)   . Chlamydia   . Hepatitis   . Heroin abuse (HCC)   . Mononucleosis   . Ovarian cyst   . Polysubstance abuse [facility](HCC)     Patient Active Problem List   Diagnosis Date Noted  . Normal labor 06/17/2018  . Postpartum care following vaginal delivery 06/17/2018  . Encounter for induction of labor 06/15/2018  . Iron deficiency anemia 04/30/2018  . Supervision of high risk pregnancy, antepartum 10/25/2017  . Substance abuse affecting pregnancy, antepartum 10/25/2017  . H/O preterm delivery, currently pregnant, first trimester 10/25/2017  . Hepatitis C 10/23/2017  . Preterm premature rupture of membranes (PPROM) with unknown onset of labor 05/12/2014  . Encephalopathy, toxic 10/03/2013  . Benzodiazepine abuse (HCC) 10/03/2013  . Heroin abuse [facility](HCC)     Past Surgical History:  Procedure Laterality Date  . HAND SURGERY    . NO PAST SURGERIES       OB History    Gravida  4   Para  2   Term  1   Preterm  1   AB  2   Living  2     SAB  2   TAB      Ectopic      Multiple  0   Live Births  2            Home Medications    Prior to Admission medications   Medication Sig Start Date End Date Taking? Authorizing Provider  clindamycin (CLEOCIN) 150 MG capsule Take 2 capsules (300 mg total) by mouth 4 (four) times daily. Patient not taking: Reported on 02/18/2019 12/05/18   Ward, Chase PicketJaime Pilcher, PA-C  ibuprofen (ADVIL,MOTRIN) 600 MG tablet Take 1 tablet (600 mg total) by mouth every 6 (six) hours. Patient not taking: Reported on 11/22/2018 06/17/18   Farrel ConnersGutierrez, Colleen, CNM  methadone (DOLOPHINE) 10 MG tablet Take 10 mg by mouth every 8 (eight) hours.    [provider]  SUBOXONE 8-2 MG FILM Place 2.25 Film under the tongue daily. 10/27/18   [provider]    Family History No family history on file.  Social History Social History   Tobacco Use  . Smoking status: Current Every Day Smoker    Packs/day: 1.00    Years: 5.00    Pack years: 5.00    Types: Cigarettes  . Smokeless tobacco: Never Used  Substance Use Topics  . Alcohol use: No  . Drug use: Not Currently    Types: IV, Heroin    Comment:  heroin  Allergies   Acetaminophen   Review of Systems Review of Systems  Constitutional: Negative for chills and fever.  Respiratory: Negative for stridor.   Cardiovascular: Negative for chest pain.  Gastrointestinal: Negative for abdominal pain, nausea and vomiting.  Skin: Positive for wound.  Neurological: Negative for weakness and numbness.  All other systems reviewed and are negative.    Physical Exam Updated Vital Signs BP 128/61 (BP Location: Right Arm)   Pulse 90   Temp 98.3 F (36.8 C) (Oral)   Resp 20   LMP 04/01/2019   SpO2 99%   Physical Exam Vitals signs and nursing note reviewed.  Constitutional:      General: She is not in acute distress.    Appearance: Normal appearance. She is not ill-appearing or toxic-appearing.  HENT:     Head: Normocephalic.  Neck:     Musculoskeletal: Normal  range of motion and neck supple.     Comments: No midline tenderness.  Cardiovascular:     Rate and Rhythm: Normal rate and regular rhythm.     Pulses:          Radial pulses are 2+ on the right side and 2+ on the left side.  Pulmonary:     Effort: Pulmonary effort is normal. No respiratory distress.     Breath sounds: Normal breath sounds.  Musculoskeletal:     Comments: Patient has multiple scabbed areas in various stages of healing to the face and extremities x 4.  UEs: multiple scabbed areas/excorations. RUE: Radial aspect of the dorsum of the R wrist w/ 3 cm diameter area of scabbing w/ swelling & induration, no obvious fluctuance palpated, this area is tender to palpation. There is erythema over the 1st MCP which is warm to the touch. No active drainage. LUE: 3 cm diameter area of scabbing/swelling w/ induration and small amount of fluctuance palpated centrally. No active drainage.   She has intact AROM throughout the UEs w/ the exception of the R 1st MCP w/ mild limitation in flexion/extension actively, she refused passive ROM secondary to pain. All other joints w/ intact AROM.   Skin:    General: Skin is warm and dry.     Capillary Refill: Capillary refill takes less than 2 seconds.  Neurological:     Mental Status: She is alert.     Comments: Alert. Clear speech. Sensation grossly intact to bilateral upper extremities. 5/5 symmetric grip strength. Ambulatory.   Psychiatric:        Mood and Affect: Mood normal.        Behavior: Behavior normal.          ED Treatments / Results  Labs (all labs ordered are listed, but only abnormal results are displayed) Labs Reviewed - No data to display  EKG None  Radiology No results found.  Procedures Procedures (including critical care time)  Medications Ordered in ED Medications - No data to display   Initial Impression / Assessment and Plan / ED Course  I have reviewed the triage vital signs and the nursing notes.   Pertinent labs & imaging results that were available during my care of the patient were reviewed by me and considered in my medical decision making (see chart for details).   Patient with history of IVDU presents to the emergency department for evaluation of bilateral wrist abscesses.  Nontoxic-appearing, no apparent distress, vitals WNL.  Exam with multiple wounds in various stages of healing.  There are areas of larger wounds that are tender  to palpation noted to the bilateral wrist- R wrist w/o fluctuance, L wrist w/ mild fluctuance, good AROM at the wrists bilaterally, consistent w/ abscess.  There is also some erythema over the right first MCP joint w/ decreased AROM as well- considering early septic joint or an infectious tenosynovitis..  Discussed findings and plan of care with the supervising physician Dr. Pilar Plate- plan for bedside US to determine need for I&D of wrist areas, he will also evaluate patient- specifically the R thumb.   Unfortunately upon Dr. Pilar Plate and my attempt to further assess the patient she had eloped from the emergency department. Multiple attempts made to find the patient unsuccessfully. Unable to have formal AMA discussion. I attempted to call the patient on her cell phone to at minimum call her in abx, but no answer x 2.   Final Clinical Impressions(s) / ED Diagnoses   Final diagnoses:  IVDU (intravenous drug user)  Abscess    ED Discharge Orders    None       Desmond Lope 04/05/19 2135    Sabas Sous, MD 04/17/19 1925

## 2019-04-05 NOTE — ED Triage Notes (Signed)
Pt presents to ED for evaluation of multiple abscesses to her forearm. Pt is Iv drug user.

## 2019-05-12 ENCOUNTER — Emergency Department (HOSPITAL_COMMUNITY)
Admission: EM | Admit: 2019-05-12 | Discharge: 2019-05-12 | Discharge status: Against Medical Advice | Payer: Self-pay | Attending: Emergency Medicine | Admitting: Emergency Medicine

## 2019-05-12 ENCOUNTER — Encounter (HOSPITAL_COMMUNITY): Payer: Self-pay

## 2019-05-12 ENCOUNTER — Other Ambulatory Visit: Payer: Self-pay

## 2019-05-12 ENCOUNTER — Emergency Department (HOSPITAL_COMMUNITY): Payer: Self-pay

## 2019-05-12 DIAGNOSIS — L03113 Cellulitis of right upper limb: Secondary | ICD-10-CM | POA: Insufficient documentation

## 2019-05-12 DIAGNOSIS — F17210 Nicotine dependence, cigarettes, uncomplicated: Secondary | ICD-10-CM | POA: Insufficient documentation

## 2019-05-12 LAB — COMPREHENSIVE METABOLIC PANEL
ALT: 16 U/L (ref 0–44)
AST: 13 U/L — ABNORMAL LOW (ref 15–41)
Albumin: 3.4 g/dL — ABNORMAL LOW (ref 3.5–5.0)
Alkaline Phosphatase: 101 U/L (ref 38–126)
Anion gap: 13 (ref 5–15)
BUN: 6 mg/dL (ref 6–20)
CO2: 27 mmol/L (ref 22–32)
Calcium: 8.8 mg/dL — ABNORMAL LOW (ref 8.9–10.3)
Chloride: 92 mmol/L — ABNORMAL LOW (ref 98–111)
Creatinine, Ser: 0.58 mg/dL (ref 0.44–1.00)
GFR calc Af Amer: >60 mL/min (ref >60)
GFR calc non Af Amer: >60 mL/min (ref >60)
Glucose, Bld: 107 mg/dL — ABNORMAL HIGH (ref 70–99)
Potassium: 2.7 mmol/L — CL (ref 3.5–5.1)
Sodium: 132 mmol/L — ABNORMAL LOW (ref 135–145)
Total Bilirubin: 0.5 mg/dL (ref 0.3–1.2)
Total Protein: 8 g/dL (ref 6.5–8.1)

## 2019-05-12 LAB — CBC WITH DIFFERENTIAL/PLATELET
Abs Immature Granulocytes: 0.25 10*3/uL — ABNORMAL HIGH (ref 0.00–0.07)
Basophils Absolute: 0 10*3/uL (ref 0.0–0.1)
Basophils Relative: 0 %
Eosinophils Absolute: 0.2 10*3/uL (ref 0.0–0.5)
Eosinophils Relative: 1 %
HCT: 33.5 % — ABNORMAL LOW (ref 36.0–46.0)
Hemoglobin: 10.4 g/dL — ABNORMAL LOW (ref 12.0–15.0)
Immature Granulocytes: 1 %
Lymphocytes Relative: 7 %
Lymphs Abs: 1.4 10*3/uL (ref 0.7–4.0)
MCH: 25.6 pg — ABNORMAL LOW (ref 26.0–34.0)
MCHC: 31 g/dL (ref 30.0–36.0)
MCV: 82.3 fL (ref 80.0–100.0)
Monocytes Absolute: 1.4 10*3/uL — ABNORMAL HIGH (ref 0.1–1.0)
Monocytes Relative: 7 %
Neutro Abs: 16.2 10*3/uL — ABNORMAL HIGH (ref 1.7–7.7)
Neutrophils Relative %: 84 %
Platelets: 317 10*3/uL (ref 150–400)
RBC: 4.07 MIL/uL (ref 3.87–5.11)
RDW: 15.2 % (ref 11.5–15.5)
WBC: 19.6 10*3/uL — ABNORMAL HIGH (ref 4.0–10.5)
nRBC: 0 % (ref 0.0–0.2)

## 2019-05-12 LAB — I-STAT BETA HCG BLOOD, ED (MC, WL, AP ONLY): I-stat hCG, quantitative: <5 m[IU]/mL (ref <5)

## 2019-05-12 LAB — LACTIC ACID, PLASMA: Lactic Acid, Venous: 0.9 mmol/L (ref 0.5–1.9)

## 2019-05-12 MED ORDER — VANCOMYCIN HCL IN DEXTROSE 1-5 GM/200ML-% IV SOLN
1000.0000 mg | Freq: Once | INTRAVENOUS | Status: AC
  Administered 2019-05-12: 1000 mg via INTRAVENOUS
  Filled 2019-05-12: qty 200

## 2019-05-12 MED ORDER — SODIUM CHLORIDE 0.9 % IV BOLUS
1000.0000 mL | Freq: Once | INTRAVENOUS | Status: AC
  Administered 2019-05-12: 1000 mL via INTRAVENOUS

## 2019-05-12 MED ORDER — PIPERACILLIN-TAZOBACTAM 3.375 G IVPB 30 MIN
3.3750 g | Freq: Once | INTRAVENOUS | Status: AC
  Administered 2019-05-12: 3.375 g via INTRAVENOUS
  Filled 2019-05-12: qty 50

## 2019-05-12 MED ORDER — IOHEXOL 300 MG/ML  SOLN
100.0000 mL | Freq: Once | INTRAMUSCULAR | Status: AC | PRN
  Administered 2019-05-12: 100 mL via INTRAVENOUS

## 2019-05-12 MED ORDER — SODIUM CHLORIDE (PF) 0.9 % IJ SOLN
INTRAMUSCULAR | Status: AC
  Filled 2019-05-12: qty 50

## 2019-05-12 MED ORDER — KETOROLAC TROMETHAMINE 30 MG/ML IJ SOLN
30.0000 mg | Freq: Once | INTRAMUSCULAR | Status: AC
  Administered 2019-05-12: 30 mg via INTRAVENOUS
  Filled 2019-05-12: qty 1

## 2019-05-12 NOTE — ED Notes (Signed)
RN went in to attempt an IV start, but scar tissue made IV start difficult. Patient became agitated with this RN stating "I have veins all over!" RN attempted to explain to patient that there is a lot of scar tissue around the veins which makes it extremely difficult to thread an IV catheter through the vein to obtain blood and give medication.  Pt still unhappy with RN explanation and stating she wants her pain medication

## 2019-05-12 NOTE — ED Notes (Signed)
Patient unhappy with Toradol for pain management. Pt states "I shoot up fentanyl everyday and you want to give me fucking the weakest shit there is?" RN explained to patient to give the medication a change.  Pt stated "If that is all you are going to give me then I am definitely leaving. I'll get stronger stuff out there."

## 2019-05-12 NOTE — ED Notes (Signed)
RN was going to go in room and start an IV and get bloodwork. Pt repeatedly said she could not stay and she could not be admitted. RN advised patient that her hand looks very red, swollen, and tight and she may need to be admitted or may even need surgery.  RN explained situation to the provider and RN was advised not to put in an IV due to pt's IV drug use history combined with flight risk.

## 2019-05-12 NOTE — ED Provider Notes (Signed)
[facility] DEPT Provider Note   CSN: [id number] Arrival date & time: 05/12/19  1246    History   Chief Complaint Chief Complaint  Patient presents with  . Hand Problem    HPI Meghan Bullock is a 28 y.o. female.     HPI Patient with history of IV drug abuse presents with right wrist/forearm swelling for the past 3 days.  States she is had subjective fevers and chills.  Limitation of movement due to pain.  Patient states that she wants to "get things started" but cannot stay this evening due to childcare issues. Past Medical History:  Diagnosis Date  . Anemia   . Anxiety   . Bipolar 1 disorder ([place])   . Chlamydia   . Hepatitis   . Heroin abuse ([place])   . Mononucleosis   . Ovarian cyst   . Polysubstance abuse [facility])     Patient Active Problem List   Diagnosis Date Noted  . Normal labor 06/17/2018  . Postpartum care following vaginal delivery 06/17/2018  . Encounter for induction of labor 06/15/2018  . Iron deficiency anemia 04/30/2018  . Supervision of high risk pregnancy, antepartum 10/25/2017  . Substance abuse affecting pregnancy, antepartum 10/25/2017  . H/O preterm delivery, currently pregnant, first trimester 10/25/2017  . Hepatitis C 10/23/2017  . Preterm premature rupture of membranes (PPROM) with unknown onset of labor 05/12/2014  . Encephalopathy, toxic 10/03/2013  . Benzodiazepine abuse ([place]) 10/03/2013  . Heroin abuse [facility])     Past Surgical History:  Procedure Laterality Date  . HAND SURGERY    . NO PAST SURGERIES       OB History    Gravida  4   Para  2   Term  1   Preterm  1   AB  2   Living  2     SAB  2   TAB      Ectopic      Multiple  0   Live Births  2            Home Medications    Prior to Admission medications   Not on File    Family History No family history on file.  Social History Social History   Tobacco Use  . Smoking status: Current Every Day Smoker    Packs/day:  1.00    Years: 5.00    Pack years: 5.00    Types: Cigarettes  . Smokeless tobacco: Never Used  Substance Use Topics  . Alcohol use: No  . Drug use: Not Currently    Types: IV, Heroin    Comment:  heroin       Allergies   Acetaminophen   Review of Systems Review of Systems  Constitutional: Positive for chills and fever.  Respiratory: Negative for shortness of breath.   Cardiovascular: Negative for chest pain.  Gastrointestinal: Negative for abdominal pain, nausea and vomiting.  Musculoskeletal: Positive for arthralgias and myalgias.  Skin: Positive for rash and wound.  Neurological: Negative for weakness and numbness.  All other systems reviewed and are negative.    Physical Exam Updated Vital Signs BP 123/62 (BP Location: Left Leg)   Pulse 95   Temp 99.2 F (37.3 C) (Oral)   Resp 16   LMP  (LMP Unknown)   SpO2 97%   Physical Exam Vitals signs and nursing note reviewed.  Constitutional:      Appearance: She is well-developed.     Comments: Patient is tearful.  HENT:     Head: Normocephalic and atraumatic.  Eyes:     Pupils: Pupils are equal, round, and reactive to light.  Neck:     Musculoskeletal: Normal range of motion and neck supple.  Cardiovascular:     Rate and Rhythm: Regular rhythm. Tachycardia present.  Pulmonary:     Effort: Pulmonary effort is normal.     Breath sounds: Normal breath sounds.  Abdominal:     General: Bowel sounds are normal.     Palpations: Abdomen is soft.     Tenderness: There is no abdominal tenderness. There is no guarding or rebound.  Musculoskeletal: Normal range of motion.        General: Tenderness present.     Comments: Limited exam of the right forearm due to patient lack of compliance.  Appears to have erythema and swelling extending from the right wrist to the mid forearm.  There is warmth and appears to be most tender over the dorsal surface of the distal forearm.  Patient has swelling that extends down to her hand  and fingers.  She has rings present on her third and fourth digits.  Multiple track marks on both forearms.  Skin:    General: Skin is warm and dry.     Findings: No erythema or rash.  Neurological:     Mental Status: She is alert and oriented to person, place, and time.     Comments: Difficult to assess strength and range of motion of the right forearm due to pain and patient's lack of effort.  Sensation appears to be grossly intact.  Psychiatric:        Behavior: Behavior normal.      ED Treatments / Results  Labs (all labs ordered are listed, but only abnormal results are displayed) Labs Reviewed  CBC WITH DIFFERENTIAL/PLATELET - Abnormal; Notable for the following components:      Result Value   WBC 19.6 (*)    Hemoglobin 10.4 (*)    HCT 33.5 (*)    MCH 25.6 (*)    Neutro Abs 16.2 (*)    Monocytes Absolute 1.4 (*)    Abs Immature Granulocytes 0.25 (*)    All other components within normal limits  COMPREHENSIVE METABOLIC PANEL - Abnormal; Notable for the following components:   Sodium 132 (*)    Potassium 2.7 (*)    Chloride 92 (*)    Glucose, Bld 107 (*)    Calcium 8.8 (*)    Albumin 3.4 (*)    AST 13 (*)    All other components within normal limits  CULTURE, BLOOD (ROUTINE X 2)  CULTURE, BLOOD (ROUTINE X 2)  SARS CORONAVIRUS 2 (HOSPITAL ORDER, PERFORMED IN [facility])  LACTIC ACID, PLASMA  I-STAT BETA HCG BLOOD, ED ([facility], [facility], [facility] ONLY)    EKG None  Radiology Ct Forearm Right W Contrast  Result Date: 05/12/2019 CLINICAL DATA:  Swelling of the right hand and wrist, IV drug use. EXAM: CT OF THE UPPER RIGHT EXTREMITY WITH CONTRAST TECHNIQUE: Multidetector CT imaging of the upper right extremity was performed according to the standard protocol following intravenous contrast administration. COMPARISON:  None. CONTRAST:  100mL OMNIPAQUE IOHEXOL 300 MG/ML  SOLN FINDINGS: Bones/Joint/Cartilage No bony destructive findings characteristic of osteomyelitis. No  elbow joint effusion identified. Ligaments Suboptimally assessed by CT. Muscles and Tendons No obvious intramuscular abscess. No well-defined myositis although MRI would have a high sensitivity. Soft tissues Abnormal subcutaneous edema primarily along the dorsal-ulnar side of  the forearm, becoming more circumferential in the vicinity of the wrist in tracking in the dorsum of the hand and into the bases of the second and third fingers. No drainable abscess identified. Likely reactive lymph node medially along the elbow, 1.0 cm in diameter on image 264/8 at the level of the distal humerus. IMPRESSION: 1. No drainable abscess or osteomyelitis identified. 2. Abnormal subcutaneous edema primarily along the dorsal-ulnar side of the forearm, tracking circumferentially in the wrist and in the dorsum of the hand. This likely represents cellulitis. Reactive lymph node medial to the distal humerus. Electronically Signed   By: Gaylyn RongWalter  Liebkemann M.D.   On: 05/12/2019 19:02    Procedures Procedures (including critical care time)  Medications Ordered in ED Medications  sodium chloride (PF) 0.9 % injection (has no administration in time range)  vancomycin (VANCOCIN) IVPB 1000 mg/200 mL premix (0 mg Intravenous Stopped 05/12/19 1835)  piperacillin-tazobactam (ZOSYN) IVPB 3.375 g (0 g Intravenous Stopped 05/12/19 1727)  sodium chloride 0.9 % bolus 1,000 mL (0 mLs Intravenous Stopped 05/12/19 1827)  ketorolac (TORADOL) 30 MG/ML injection 30 mg (30 mg Intravenous Given 05/12/19 1655)  iohexol (OMNIPAQUE) 300 MG/ML solution 100 mL (100 mLs Intravenous Contrast Given 05/12/19 1846)     Initial Impression / Assessment and Plan / ED Course  I have reviewed the triage vital signs and the nursing notes.  Pertinent labs & imaging results that were available during my care of the patient were reviewed by me and considered in my medical decision making (see chart for details).        I stressed to the patient the need to  stay in the hospital for complete work-up, IV antibiotics, imaging and likely surgery.  Patient states that she cannot stay this evening but states she promises she she will come back tomorrow.  I stressed to her that she could possibly lose her arm or develop sepsis and die.  She is aware of this.  Verbally, she has pledged that she will not leave prior to having her IV removed.  Will place IV and start antibiotics.  We will draw blood cultures.  Will attempt to remove rings from the right hand.  We will also get CT of the forearm to assess extent of abscess which likely is present.   Patient received dose of IV vancomycin and IV Zosyn.  Blood cultures were sent.  Patient also had CT of her right forearm performed and rings were removed.  When I reevaluated the patient she was quite comfortable.  She states that she will return tomorrow for reevaluation and is aware that she is leaving AGAINST MEDICAL ADVICE.  Her condition could deteriorate resulting in death. Final Clinical Impressions(s) / ED Diagnoses   Final diagnoses:  Cellulitis of right forearm    ED Discharge Orders    None       Loren RacerYelverton, Tya Haughey, MD 05/12/19 1934

## 2019-05-12 NOTE — ED Triage Notes (Signed)
He c/o much right hand pain/swelling x 3 days. She has many visible track marks. She is in no distress and is eating snack food at triage.

## 2019-05-12 NOTE — ED Notes (Signed)
Patient unhappy with not receiving narcotic pain medication and reports she is leaving. RN removed IV and explained to patient's the risk of leaving AMA. Pt aware of risks of leaving.

## 2019-05-12 NOTE — ED Notes (Signed)
Rings were removed from swollen hand before patient left AMA

## 2019-05-13 ENCOUNTER — Other Ambulatory Visit: Payer: Self-pay

## 2019-05-13 ENCOUNTER — Encounter (HOSPITAL_COMMUNITY): Payer: Self-pay | Admitting: Emergency Medicine

## 2019-05-13 ENCOUNTER — Emergency Department (HOSPITAL_COMMUNITY)
Admission: EM | Admit: 2019-05-13 | Discharge: 2019-05-13 | Discharge status: Against Medical Advice | Payer: Self-pay | Attending: Emergency Medicine | Admitting: Emergency Medicine

## 2019-05-13 DIAGNOSIS — F17210 Nicotine dependence, cigarettes, uncomplicated: Secondary | ICD-10-CM | POA: Insufficient documentation

## 2019-05-13 DIAGNOSIS — Z5320 Procedure and treatment not carried out because of patient's decision for unspecified reasons: Secondary | ICD-10-CM | POA: Insufficient documentation

## 2019-05-13 DIAGNOSIS — L03113 Cellulitis of right upper limb: Secondary | ICD-10-CM | POA: Insufficient documentation

## 2019-05-13 LAB — COMPREHENSIVE METABOLIC PANEL
ALT: 17 U/L (ref 0–44)
AST: 22 U/L (ref 15–41)
Albumin: 2.9 g/dL — ABNORMAL LOW (ref 3.5–5.0)
Alkaline Phosphatase: 112 U/L (ref 38–126)
Anion gap: 12 (ref 5–15)
BUN: 7 mg/dL (ref 6–20)
CO2: 29 mmol/L (ref 22–32)
Calcium: 9.1 mg/dL (ref 8.9–10.3)
Chloride: 94 mmol/L — ABNORMAL LOW (ref 98–111)
Creatinine, Ser: 0.62 mg/dL (ref 0.44–1.00)
GFR calc Af Amer: >60 mL/min (ref >60)
GFR calc non Af Amer: >60 mL/min (ref >60)
Glucose, Bld: 99 mg/dL (ref 70–99)
Potassium: 2.8 mmol/L — ABNORMAL LOW (ref 3.5–5.1)
Sodium: 135 mmol/L (ref 135–145)
Total Bilirubin: 0.5 mg/dL (ref 0.3–1.2)
Total Protein: 7.4 g/dL (ref 6.5–8.1)

## 2019-05-13 LAB — CBC WITH DIFFERENTIAL/PLATELET
Abs Immature Granulocytes: 0.09 10*3/uL — ABNORMAL HIGH (ref 0.00–0.07)
Basophils Absolute: 0 10*3/uL (ref 0.0–0.1)
Basophils Relative: 0 %
Eosinophils Absolute: 0.2 10*3/uL (ref 0.0–0.5)
Eosinophils Relative: 2 %
HCT: 33.5 % — ABNORMAL LOW (ref 36.0–46.0)
Hemoglobin: 10.2 g/dL — ABNORMAL LOW (ref 12.0–15.0)
Immature Granulocytes: 1 %
Lymphocytes Relative: 16 %
Lymphs Abs: 2.4 10*3/uL (ref 0.7–4.0)
MCH: 25.2 pg — ABNORMAL LOW (ref 26.0–34.0)
MCHC: 30.4 g/dL (ref 30.0–36.0)
MCV: 82.7 fL (ref 80.0–100.0)
Monocytes Absolute: 0.8 10*3/uL (ref 0.1–1.0)
Monocytes Relative: 5 %
Neutro Abs: 11.9 10*3/uL — ABNORMAL HIGH (ref 1.7–7.7)
Neutrophils Relative %: 76 %
Platelets: 410 10*3/uL — ABNORMAL HIGH (ref 150–400)
RBC: 4.05 MIL/uL (ref 3.87–5.11)
RDW: 15 % (ref 11.5–15.5)
WBC: 15.5 10*3/uL — ABNORMAL HIGH (ref 4.0–10.5)
nRBC: 0 % (ref 0.0–0.2)

## 2019-05-13 LAB — LACTIC ACID, PLASMA: Lactic Acid, Venous: 1 mmol/L (ref 0.5–1.9)

## 2019-05-13 MED ORDER — VANCOMYCIN HCL IN DEXTROSE 1-5 GM/200ML-% IV SOLN: 1000.0000 mg | Freq: Two times a day (BID) | INTRAVENOUS | Status: DC

## 2019-05-13 MED ORDER — PIPERACILLIN-TAZOBACTAM 3.375 G IVPB 30 MIN: 3.3750 g | Freq: Once | INTRAVENOUS | Status: DC

## 2019-05-13 MED ORDER — KETOROLAC TROMETHAMINE 15 MG/ML IJ SOLN: 15.0000 mg | Freq: Once | INTRAMUSCULAR | Status: DC

## 2019-05-13 MED ORDER — SODIUM CHLORIDE 0.9 % IV BOLUS: 1000.0000 mL | Freq: Once | INTRAVENOUS | Status: DC

## 2019-05-13 NOTE — ED Provider Notes (Signed)
[facility] Provider Note   CSN: [id number] Arrival date & time: 05/13/19  1844    History   Chief Complaint Chief Complaint  Patient presents with   R Hand cellulitis    HPI Meghan Bullock is a 28 y.o. female.     HPI  28 year old female presents with right arm cellulitis.  Was seen at [facility] and left AMA yesterday.  She states that she feels a little bit better after the IV antibiotics and would like further IV antibiotics.  However she states she cannot stay tonight again due to seeing her child and her child's birthday is tomorrow.  She had a fever yesterday, none today.  Some tingling in her fingertips.  She has a chronic injury to her hand from about a year ago but also has been shooting up in this arm. No drainage.  Past Medical History:  Diagnosis Date   Anemia    Anxiety    Bipolar 1 disorder ([facility])    Chlamydia    Hepatitis    Heroin abuse ([facility])    Mononucleosis    Ovarian cyst    Polysubstance abuse [facility](H[facility])     Patient Active Problem List   Diagnosis Date Noted   Normal labor 06/17/2018   Postpartum care following vaginal delivery 06/17/2018   Encounter for induction of labor 06/15/2018   Iron deficiency anemia 04/30/2018   Supervision of high risk pregnancy, antepartum 10/25/2017   Substance abuse affecting pregnancy, antepartum 10/25/2017   H/O preterm delivery, currently pregnant, first trimester 10/25/2017   Hepatitis C 10/23/2017   Preterm premature rupture of membranes (PPROM) with unknown onset of labor 05/12/2014   Encephalopathy, toxic 10/03/2013   Benzodiazepine abuse ([facility]) 10/03/2013   Heroin abuse ([facility])     Past Surgical History:  Procedure Laterality Date   HAND SURGERY     NO PAST SURGERIES       OB History    Gravida  4   Para  2   Term  1   Preterm  1   AB  2   Living  2     SAB  2   TAB      Ectopic      Multiple  0   Live Births  2             Home Medications    Prior to Admission medications   Not on File    Family History No family history on file.  Social History Social History   Tobacco Use   Smoking status: Current Every Day Smoker    Packs/day: 1.00    Years: 5.00    Pack years: 5.00    Types: Cigarettes   Smokeless tobacco: Never Used  Substance Use Topics   Alcohol use: No   Drug use: Not Currently    Types: IV, Heroin    Comment:  heroin       Allergies   Acetaminophen   Review of Systems Review of Systems  Constitutional: Positive for fever.  Musculoskeletal: Positive for joint swelling.  Skin: Positive for color change and wound.  All other systems reviewed and are negative.    Physical Exam Updated Vital Signs BP (!) 126/92 (BP Location: Left Arm)    Pulse (!) 108    Temp 98.3 F (36.8 C) (Oral)    Resp 17    Wt 72.6 kg    LMP  (LMP Unknown)    SpO2 100%  BMI 27.47 kg/m   Physical Exam Vitals signs and nursing note reviewed.  Constitutional:      General: She is not in acute distress.    Appearance: She is well-developed. She is not ill-appearing or diaphoretic.  HENT:     Head: Normocephalic and atraumatic.     Right Ear: External ear normal.     Left Ear: External ear normal.     Nose: Nose normal.  Eyes:     General:        Right eye: No discharge.        Left eye: No discharge.  Cardiovascular:     Rate and Rhythm: Regular rhythm. Tachycardia present.     Pulses:          Radial pulses are 2+ on the right side.  Pulmonary:     Effort: Pulmonary effort is normal.     Breath sounds: Normal breath sounds.  Abdominal:     General: There is no distension.  Musculoskeletal:     Comments: See picture. Diffuse erythema along hand, wrist and most of forearm. No streaking. Diffusely tender and swollen.  Skin:    General: Skin is warm and dry.     Findings: Erythema present.  Neurological:     Mental Status: She is alert.  Psychiatric:        Mood and Affect:  Mood is not anxious.        ED Treatments / Results  Labs (all labs ordered are listed, but only abnormal results are displayed) Labs Reviewed  COMPREHENSIVE METABOLIC PANEL - Abnormal; Notable for the following components:      Result Value   Potassium 2.8 (*)    Chloride 94 (*)    Albumin 2.9 (*)    All other components within normal limits  CBC WITH DIFFERENTIAL/PLATELET - Abnormal; Notable for the following components:   WBC 15.5 (*)    Hemoglobin 10.2 (*)    HCT 33.5 (*)    MCH 25.2 (*)    Platelets 410 (*)    Neutro Abs 11.9 (*)    Abs Immature Granulocytes 0.09 (*)    All other components within normal limits  LACTIC ACID, PLASMA  LACTIC ACID, PLASMA    EKG None  Radiology Ct Forearm Right W Contrast  Result Date: 05/12/2019 CLINICAL DATA:  Swelling of the right hand and wrist, IV drug use. EXAM: CT OF THE UPPER RIGHT EXTREMITY WITH CONTRAST TECHNIQUE: Multidetector CT imaging of the upper right extremity was performed according to the standard protocol following intravenous contrast administration. COMPARISON:  None. CONTRAST:  189mL OMNIPAQUE IOHEXOL 300 MG/ML  SOLN FINDINGS: Bones/Joint/Cartilage No bony destructive findings characteristic of osteomyelitis. No elbow joint effusion identified. Ligaments Suboptimally assessed by CT. Muscles and Tendons No obvious intramuscular abscess. No well-defined myositis although MRI would have a high sensitivity. Soft tissues Abnormal subcutaneous edema primarily along the dorsal-ulnar side of the forearm, becoming more circumferential in the vicinity of the wrist in tracking in the dorsum of the hand and into the bases of the second and third fingers. No drainable abscess identified. Likely reactive lymph node medially along the elbow, 1.0 cm in diameter on image 264/8 at the level of the distal humerus. IMPRESSION: 1. No drainable abscess or osteomyelitis identified. 2. Abnormal subcutaneous edema primarily along the dorsal-ulnar  side of the forearm, tracking circumferentially in the wrist and in the dorsum of the hand. This likely represents cellulitis. Reactive lymph node medial to the distal humerus. Electronically  Signed   By: Gaylyn RongWalter  Liebkemann M.D.   On: 05/12/2019 19:02    Procedures Procedures (including critical care time)  Medications Ordered in ED Medications  sodium chloride 0.9 % bolus 1,000 mL (has no administration in time range)  piperacillin-tazobactam (ZOSYN) IVPB 3.375 g (has no administration in time range)  vancomycin (VANCOCIN) IVPB 1000 mg/200 mL premix (has no administration in time range)  ketorolac (TORADOL) 15 MG/ML injection 15 mg (has no administration in time range)     Initial Impression / Assessment and Plan / ED Course  I have reviewed the triage vital signs and the nursing notes.  Pertinent labs & imaging results that were available during my care of the patient were reviewed by me and considered in my medical decision making (see chart for details).        Patient is adamant that she does not want to come into the hospital.  I agreed to give her IV antibiotics and then discharge her with oral antibiotics though I discussed that this is still AGAINST MEDICAL ADVICE given the degree of her cellulitis and that this would be best served as admission.  Initially agreed to this plan though while I was seeing another patient she left prior to any antibiotics.  Her labs were not reviewed with her.  I was unable to get a chance to talk to her again prior to her leaving.  Final Clinical Impressions(s) / ED Diagnoses   Final diagnoses:  Right forearm cellulitis    ED Discharge Orders    None       Pricilla LovelessGoldston, Eddi Hymes, MD 05/13/19 2337

## 2019-05-13 NOTE — Progress Notes (Signed)
Pharmacy Antibiotic Note  Meghan Bullock is a 28 y.o. female with a history of polysubstance abuse admitted on 05/13/2019 with right arm cellulitis.  Pharmacy has been consulted for Vancomycin dosing. Patient was seen at [facility] yesterday and left AMA. Patient received Vancomycin 1000 mg IV x 1 at 1735 PM and Zosyn 3.375g IV x1 at 1657 PM on 05/12/19 prior to leaving AMA. She was febrile prior to IV antibiotics yesterday. No drainage noted. SCr yesterday was 0.58 - within normal limits. WBC yesterday 19.6. A repeat dose of Zosyn was given here today at   Plan: Vancomycin 1000 mg IV every 12 hours. Goal AUC 400-550. Expected AUC: 437 SCr used: 0.8 (rounded up per protocol) Follow-up renal function, culture results, and clinical status   Weight: 160 lb 0.9 oz (72.6 kg)  Temp (24hrs), Avg:98.3 F (36.8 C), Min:98.3 F (36.8 C), Max:98.3 F (36.8 C)  Recent Labs  Lab 05/12/19 1646  WBC 19.6*  CREATININE 0.58  LATICACIDVEN 0.9    Estimated Creatinine Clearance: 104.1 mL/min (by C-G formula based on SCr of 0.58 mg/dL).    Allergies  Allergen Reactions  . Acetaminophen Itching    Antimicrobials this admission: Vancomycin 6/14, 6/15 >> Zosyn 6/14, 6/15   Dose adjustments this admission:   Microbiology results:  Thank you for allowing pharmacy to be a part of this patient's care.  Sloan Leiter, PharmD, BCPS, BCCCP Clinical Pharmacist Clinical phone 05/13/2019 until 11PM [phone] Please refer to [facility] for [place] numbers 05/13/2019 8:41 PM

## 2019-05-13 NOTE — ED Notes (Addendum)
Pt seen walking outside with cigarette in hand. Vicente Males RN notified

## 2019-05-13 NOTE — ED Notes (Signed)
Unable to locate pt at this time

## 2019-05-13 NOTE — ED Notes (Signed)
Pt refusing to put on gown.

## 2019-05-13 NOTE — ED Notes (Signed)
Pt seen leaving ED and walking outside. Pt states that she signed something to leave.

## 2019-05-13 NOTE — ED Notes (Signed)
Pt left AMA due to not getting pain medication

## 2019-05-13 NOTE — ED Triage Notes (Signed)
Pt in with R hand/forearm swelling/cellullitis, went to [facility] yesterday and left AMA. Pt reports increased swelling and drainage today, hx of IVDU, last use today.

## 2019-05-13 NOTE — ED Notes (Signed)
Pt requesting to leave with prescription PO antibiotics. MD Regenia Skeeter notified.

## 2019-05-13 NOTE — ED Notes (Signed)
Pt returned to the lobby. Asked pt where she was and informed her that we had called her name twice for a room as well as called the number in her chart. Pt said that she was in the bathroom on the phone. Pt asked to please have a seat and informed pt that we would work on getting her back. Santiago Glad RN notified

## 2019-05-18 LAB — CULTURE, BLOOD (ROUTINE X 2)
Culture: NO GROWTH
Special Requests: ADEQUATE

## 2019-07-13 ENCOUNTER — Emergency Department (HOSPITAL_COMMUNITY)
Admission: EM | Admit: 2019-07-13 | Discharge: 2019-07-13 | Disposition: A | Discharge status: Home / Self Care | Payer: Self-pay | Attending: Emergency Medicine | Admitting: Emergency Medicine

## 2019-07-13 ENCOUNTER — Other Ambulatory Visit: Payer: Self-pay

## 2019-07-13 ENCOUNTER — Encounter (HOSPITAL_COMMUNITY): Payer: Self-pay

## 2019-07-13 DIAGNOSIS — B1920 Unspecified viral hepatitis C without hepatic coma: Secondary | ICD-10-CM | POA: Insufficient documentation

## 2019-07-13 DIAGNOSIS — L0291 Cutaneous abscess, unspecified: Secondary | ICD-10-CM

## 2019-07-13 DIAGNOSIS — L02414 Cutaneous abscess of left upper limb: Secondary | ICD-10-CM | POA: Insufficient documentation

## 2019-07-13 DIAGNOSIS — F1110 Opioid abuse, uncomplicated: Secondary | ICD-10-CM

## 2019-07-13 DIAGNOSIS — F17210 Nicotine dependence, cigarettes, uncomplicated: Secondary | ICD-10-CM | POA: Insufficient documentation

## 2019-07-13 DIAGNOSIS — F11129 Opioid abuse with intoxication, unspecified: Secondary | ICD-10-CM | POA: Insufficient documentation

## 2019-07-13 MED ORDER — LIDOCAINE-EPINEPHRINE (PF) 2 %-1:200000 IJ SOLN
10.0000 mL | Freq: Once | INTRAMUSCULAR | Status: AC
  Administered 2019-07-13: 10 mL
  Filled 2019-07-13: qty 10

## 2019-07-13 MED ORDER — SULFAMETHOXAZOLE-TRIMETHOPRIM 800-160 MG PO TABS: 1.0000 | ORAL_TABLET | Freq: Once | ORAL | Status: DC

## 2019-07-13 MED ORDER — SULFAMETHOXAZOLE-TRIMETHOPRIM 800-160 MG PO TABS: 1.0000 | ORAL_TABLET | Freq: Two times a day (BID) | ORAL | 0 refills | Status: AC

## 2019-07-13 MED ORDER — VANCOMYCIN HCL IN DEXTROSE 1-5 GM/200ML-% IV SOLN
1000.0000 mg | Freq: Once | INTRAVENOUS | Status: DC
  Filled 2019-07-13: qty 200

## 2019-07-13 MED ORDER — CEFAZOLIN SODIUM-DEXTROSE 1-4 GM/50ML-% IV SOLN
1.0000 g | Freq: Once | INTRAVENOUS | Status: AC
  Administered 2019-07-13: 1 g via INTRAVENOUS
  Filled 2019-07-13: qty 50

## 2019-07-13 NOTE — ED Notes (Signed)
XEROFORM APPLIED ALONG WITH 4X4 BANDAGE AND SECURED WITH 4'KLING

## 2019-07-13 NOTE — ED Provider Notes (Signed)
[facility]-EMERGENCY DEPT Provider Note   CSN: [id number] Arrival date & time: 07/13/19  0601    History   Chief Complaint [id number] Complaint  Patient presents with  . Abscess    HPI Meghan Bullock is a 28 y.o. female.     Pt presents to the ED today with an abscess to her left wrist.  She admits to using heroin.  She has had multiple abscesses in the past.  This one has been there for about 2 days.     Past Medical History:  Diagnosis Date  . Anemia   . Anxiety   . Bipolar 1 disorder (HCC)   . Chlamydia   . Hepatitis   . Heroin abuse (HCC)   . Mononucleosis   . Ovarian cyst   . Polysubstance abuse [facility](HCC)     Patient Active Problem List   Diagnosis Date Noted  . Normal labor 06/17/2018  . Postpartum care following vaginal delivery 06/17/2018  . Encounter for induction of labor 06/15/2018  . Iron deficiency anemia 04/30/2018  . Supervision of high risk pregnancy, antepartum 10/25/2017  . Substance abuse affecting pregnancy, antepartum 10/25/2017  . H/O preterm delivery, currently pregnant, first trimester 10/25/2017  . Hepatitis C 10/23/2017  . Preterm premature rupture of membranes (PPROM) with unknown onset of labor 05/12/2014  . Encephalopathy, toxic 10/03/2013  . Benzodiazepine abuse (HCC) 10/03/2013  . Heroin abuse [facility](HCC)     Past Surgical History:  Procedure Laterality Date  . HAND SURGERY    . NO PAST SURGERIES       OB History    Gravida  4   Para  2   Term  1   Preterm  1   AB  2   Living  2     SAB  2   TAB      Ectopic      Multiple  0   Live Births  2            Home Medications    Prior to Admission medications   Medication Sig Start Date End Date Taking? Authorizing Provider  sulfamethoxazole-trimethoprim (BACTRIM DS) 800-160 MG tablet Take 1 tablet by mouth 2 (two) times daily for 7 days. 07/13/19 07/20/19  Jacalyn LefevreHaviland, Keiri Solano, MD    Family History History reviewed. No pertinent family history.  Social History Social History   Tobacco Use  . Smoking status: Current Every Day Smoker    Packs/day: 1.00    Years: 5.00    Pack years: 5.00    Types: Cigarettes  . Smokeless tobacco: Never Used  Substance Use Topics  . Alcohol use: No  . Drug use: Not Currently    Types: IV, Heroin    Comment:  heroin       Allergies   Acetaminophen   Review of Systems Review of Systems  Skin:       Abscess to left wrist  All other systems reviewed and are negative.    Physical Exam Updated Vital Signs BP 130/84 (BP Location: Right Arm)   Pulse 83   Temp 98.4 F (36.9 C) (Oral)   Resp 16   SpO2 97%   Physical Exam Vitals signs and nursing note reviewed.  Constitutional:      Appearance: Normal appearance.  HENT:     Head: Normocephalic and atraumatic.     Right Ear: External ear normal.     Left Ear: External ear normal.     Nose: Nose normal.  Mouth/Throat:     Mouth: Mucous membranes are moist.     Pharynx: Oropharynx is clear.  Eyes:     Extraocular Movements: Extraocular movements intact.     Conjunctiva/sclera: Conjunctivae normal.     Pupils: Pupils are equal, round, and reactive to light.  Neck:     Musculoskeletal: Normal range of motion and neck supple.  Cardiovascular:     Rate and Rhythm: Normal rate and regular rhythm.     Pulses: Normal pulses.     Heart sounds: Normal heart sounds.  Pulmonary:     Effort: Pulmonary effort is normal.     Breath sounds: Normal breath sounds.  Abdominal:     General: Abdomen is flat. Bowel sounds are normal.     Palpations: Abdomen is soft.  Musculoskeletal: Normal range of motion.  Skin:    General: Skin is warm.     Capillary Refill: Capillary refill takes less than 2 seconds.     Comments: See picture  Neurological:     General: No focal deficit present.     Mental Status: She is alert and oriented to person, place, and time.  Psychiatric:        Mood and Affect: Mood normal.        Behavior: Behavior  normal.        ED Treatments / Results  Labs (all labs ordered are listed, but only abnormal results are displayed) Labs Reviewed - No data to display  EKG None  Radiology No results found.  Procedures .Marland Kitchen.Incision and Drainage  Date/Time: 07/13/2019 8:52 AM Performed by: Jacalyn LefevreHaviland, Ashritha Desrosiers, MD Authorized by: Jacalyn LefevreHaviland, Kimoni Pickerill, MD   Consent:    Consent obtained:  Verbal   Consent given by:  Patient   Risks discussed:  Bleeding, incomplete drainage and pain   Alternatives discussed:  No treatment Location:    Type:  Abscess   Size:  3 by 3   Location:  Upper extremity   Upper extremity location:  Wrist   Wrist location:  L wrist Pre-procedure details:    Skin preparation:  Chloraprep Anesthesia (see MAR for exact dosages):    Anesthesia method:  Local infiltration   Local anesthetic:  Lidocaine 2% WITH epi Procedure type:    Complexity:  Simple Procedure details:    Incision types:  Cruciate   Scalpel blade:  11   Drainage:  Purulent   Drainage amount:  Moderate   Wound treatment:  Wound left open   Packing materials:  None Post-procedure details:    Patient tolerance of procedure:  Procedure terminated at patient's request Comments:     Pt would not let us probe or irrigate the wound.  She said she would do it herself.  She was advised against this, but said she's done it many times.   (including critical care time)  Medications Ordered in ED Medications  lidocaine-EPINEPHrine (XYLOCAINE W/EPI) 2 %-1:200000 (PF) injection 10 mL (has no administration in time range)  vancomycin (VANCOCIN) IVPB 1000 mg/200 mL premix (has no administration in time range)  sulfamethoxazole-trimethoprim (BACTRIM DS) 800-160 MG per tablet 1 tablet (has no administration in time range)  ceFAZolin (ANCEF) IVPB 1 g/50 mL premix (1 g Intravenous New Bag/Given 07/13/19 [id number])     Initial Impression / Assessment and Plan / ED Course  I have reviewed the triage vital signs and the nursing  notes.  Pertinent labs & imaging results that were available during my care of the patient were reviewed by me and considered  in my medical decision making (see chart for details).       Pt refused to wait for her vancomycin to be started.  She knows that she can return at any time.  Pt given resource guide for outpatient heroin abuse.     Final Clinical Impressions(s) / ED Diagnoses   Final diagnoses:  Abscess  Heroin abuse [facility])    ED Discharge Orders         Ordered    sulfamethoxazole-trimethoprim (BACTRIM DS) 800-160 MG tablet  2 times daily     07/13/19 1856           Isla Pence, MD 07/13/19 [phone]

## 2019-07-13 NOTE — ED Triage Notes (Signed)
Pt presents with an abscess to her L wrist. She states that she is an IV heroin user. States that she last used yesterday. Large red lump noted to L wrist.

## 2019-09-01 ENCOUNTER — Emergency Department (HOSPITAL_COMMUNITY): Admission: EM | Admit: 2019-09-01 | Discharge: 2019-09-01 | Discharge status: Against Medical Advice | Payer: Self-pay

## 2019-09-01 ENCOUNTER — Other Ambulatory Visit: Payer: Self-pay

## 2019-09-01 NOTE — ED Triage Notes (Signed)
Pt did not respond to name being called.   

## 2019-09-14 ENCOUNTER — Inpatient Hospital Stay (HOSPITAL_COMMUNITY)
Admission: EM | Admit: 2019-09-14 | Discharge: 2019-09-17 | DRG: 603 | Discharge status: Against Medical Advice | Payer: Self-pay | Attending: Internal Medicine | Admitting: Internal Medicine

## 2019-09-14 ENCOUNTER — Emergency Department (HOSPITAL_COMMUNITY): Payer: Self-pay

## 2019-09-14 ENCOUNTER — Other Ambulatory Visit: Payer: Self-pay

## 2019-09-14 ENCOUNTER — Encounter (HOSPITAL_COMMUNITY): Payer: Self-pay | Admitting: Emergency Medicine

## 2019-09-14 DIAGNOSIS — Z66 Do not resuscitate: Secondary | ICD-10-CM | POA: Diagnosis present

## 2019-09-14 DIAGNOSIS — L02512 Cutaneous abscess of left hand: Secondary | ICD-10-CM | POA: Diagnosis present

## 2019-09-14 DIAGNOSIS — Z20828 Contact with and (suspected) exposure to other viral communicable diseases: Secondary | ICD-10-CM | POA: Diagnosis present

## 2019-09-14 DIAGNOSIS — L02519 Cutaneous abscess of unspecified hand: Secondary | ICD-10-CM | POA: Diagnosis present

## 2019-09-14 DIAGNOSIS — R011 Cardiac murmur, unspecified: Secondary | ICD-10-CM | POA: Diagnosis present

## 2019-09-14 DIAGNOSIS — I491 Atrial premature depolarization: Secondary | ICD-10-CM | POA: Diagnosis present

## 2019-09-14 DIAGNOSIS — Z8619 Personal history of other infectious and parasitic diseases: Secondary | ICD-10-CM

## 2019-09-14 DIAGNOSIS — F1990 Other psychoactive substance use, unspecified, uncomplicated: Secondary | ICD-10-CM

## 2019-09-14 DIAGNOSIS — F1910 Other psychoactive substance abuse, uncomplicated: Secondary | ICD-10-CM

## 2019-09-14 DIAGNOSIS — L02414 Cutaneous abscess of left upper limb: Principal | ICD-10-CM | POA: Diagnosis present

## 2019-09-14 DIAGNOSIS — Z5329 Procedure and treatment not carried out because of patient's decision for other reasons: Secondary | ICD-10-CM | POA: Diagnosis not present

## 2019-09-14 DIAGNOSIS — L03114 Cellulitis of left upper limb: Secondary | ICD-10-CM | POA: Diagnosis present

## 2019-09-14 DIAGNOSIS — Z886 Allergy status to analgesic agent status: Secondary | ICD-10-CM

## 2019-09-14 DIAGNOSIS — F17210 Nicotine dependence, cigarettes, uncomplicated: Secondary | ICD-10-CM | POA: Diagnosis present

## 2019-09-14 DIAGNOSIS — F1120 Opioid dependence, uncomplicated: Secondary | ICD-10-CM | POA: Diagnosis present

## 2019-09-14 DIAGNOSIS — L03119 Cellulitis of unspecified part of limb: Secondary | ICD-10-CM | POA: Diagnosis present

## 2019-09-14 NOTE — ED Triage Notes (Signed)
Patient reports left hand swelling onset today , denies injury , no fever or chills .

## 2019-09-15 ENCOUNTER — Inpatient Hospital Stay (HOSPITAL_COMMUNITY): Payer: Self-pay

## 2019-09-15 DIAGNOSIS — L03112 Cellulitis of left axilla: Secondary | ICD-10-CM

## 2019-09-15 DIAGNOSIS — F1910 Other psychoactive substance abuse, uncomplicated: Secondary | ICD-10-CM

## 2019-09-15 DIAGNOSIS — F17210 Nicotine dependence, cigarettes, uncomplicated: Secondary | ICD-10-CM

## 2019-09-15 DIAGNOSIS — L02414 Cutaneous abscess of left upper limb: Principal | ICD-10-CM

## 2019-09-15 DIAGNOSIS — R197 Diarrhea, unspecified: Secondary | ICD-10-CM

## 2019-09-15 DIAGNOSIS — L03114 Cellulitis of left upper limb: Secondary | ICD-10-CM | POA: Diagnosis present

## 2019-09-15 DIAGNOSIS — Z888 Allergy status to other drugs, medicaments and biological substances status: Secondary | ICD-10-CM

## 2019-09-15 DIAGNOSIS — R112 Nausea with vomiting, unspecified: Secondary | ICD-10-CM

## 2019-09-15 DIAGNOSIS — Z66 Do not resuscitate: Secondary | ICD-10-CM

## 2019-09-15 DIAGNOSIS — F1190 Opioid use, unspecified, uncomplicated: Secondary | ICD-10-CM

## 2019-09-15 DIAGNOSIS — R011 Cardiac murmur, unspecified: Secondary | ICD-10-CM | POA: Insufficient documentation

## 2019-09-15 DIAGNOSIS — L02512 Cutaneous abscess of left hand: Secondary | ICD-10-CM

## 2019-09-15 DIAGNOSIS — F319 Bipolar disorder, unspecified: Secondary | ICD-10-CM

## 2019-09-15 DIAGNOSIS — L02519 Cutaneous abscess of unspecified hand: Secondary | ICD-10-CM | POA: Diagnosis present

## 2019-09-15 LAB — BASIC METABOLIC PANEL
Anion gap: 9 (ref 5–15)
BUN: 8 mg/dL (ref 6–20)
CO2: 25 mmol/L (ref 22–32)
Calcium: 8.9 mg/dL (ref 8.9–10.3)
Chloride: 104 mmol/L (ref 98–111)
Creatinine, Ser: 0.62 mg/dL (ref 0.44–1.00)
GFR calc Af Amer: >60 mL/min (ref >60)
GFR calc non Af Amer: >60 mL/min (ref >60)
Glucose, Bld: 129 mg/dL — ABNORMAL HIGH (ref 70–99)
Potassium: 3.8 mmol/L (ref 3.5–5.1)
Sodium: 138 mmol/L (ref 135–145)

## 2019-09-15 LAB — I-STAT BETA HCG BLOOD, ED (MC, WL, AP ONLY): I-stat hCG, quantitative: <5 m[IU]/mL (ref <5)

## 2019-09-15 LAB — HIV ANTIBODY (ROUTINE TESTING W REFLEX): HIV Screen 4th Generation wRfx: NONREACTIVE

## 2019-09-15 LAB — CBC WITH DIFFERENTIAL/PLATELET
Abs Immature Granulocytes: 0.09 10*3/uL — ABNORMAL HIGH (ref 0.00–0.07)
Basophils Absolute: 0.1 10*3/uL (ref 0.0–0.1)
Basophils Relative: 0 %
Eosinophils Absolute: 0.3 10*3/uL (ref 0.0–0.5)
Eosinophils Relative: 2 %
HCT: 33.7 % — ABNORMAL LOW (ref 36.0–46.0)
Hemoglobin: 10 g/dL — ABNORMAL LOW (ref 12.0–15.0)
Immature Granulocytes: 1 %
Lymphocytes Relative: 17 %
Lymphs Abs: 2.1 10*3/uL (ref 0.7–4.0)
MCH: 24.6 pg — ABNORMAL LOW (ref 26.0–34.0)
MCHC: 29.7 g/dL — ABNORMAL LOW (ref 30.0–36.0)
MCV: 82.8 fL (ref 80.0–100.0)
Monocytes Absolute: 1 10*3/uL (ref 0.1–1.0)
Monocytes Relative: 8 %
Neutro Abs: 9 10*3/uL — ABNORMAL HIGH (ref 1.7–7.7)
Neutrophils Relative %: 72 %
Platelets: 302 10*3/uL (ref 150–400)
RBC: 4.07 MIL/uL (ref 3.87–5.11)
RDW: 14.1 % (ref 11.5–15.5)
WBC: 12.6 10*3/uL — ABNORMAL HIGH (ref 4.0–10.5)
nRBC: 0 % (ref 0.0–0.2)

## 2019-09-15 LAB — SARS CORONAVIRUS 2 (TAT 6-24 HRS): SARS Coronavirus 2: NEGATIVE

## 2019-09-15 LAB — ECHOCARDIOGRAM COMPLETE
Height: 64 in
Weight: 2560 oz

## 2019-09-15 LAB — LACTIC ACID, PLASMA: Lactic Acid, Venous: 1.5 mmol/L (ref 0.5–1.9)

## 2019-09-15 MED ORDER — VANCOMYCIN HCL 10 G IV SOLR
1250.0000 mg | Freq: Once | INTRAVENOUS | Status: AC
  Administered 2019-09-15: 1250 mg via INTRAVENOUS
  Filled 2019-09-15: qty 1250

## 2019-09-15 MED ORDER — VANCOMYCIN HCL IN DEXTROSE 1-5 GM/200ML-% IV SOLN
1000.0000 mg | Freq: Two times a day (BID) | INTRAVENOUS | Status: DC
  Administered 2019-09-15 – 2019-09-17 (×4): 1000 mg via INTRAVENOUS
  Filled 2019-09-15 (×5): qty 200

## 2019-09-15 MED ORDER — METHADONE HCL 10 MG PO TABS: 20.0000 mg | ORAL_TABLET | Freq: Every day | ORAL | Status: DC

## 2019-09-15 MED ORDER — HYDROMORPHONE HCL 1 MG/ML IJ SOLN
1.0000 mg | Freq: Once | INTRAMUSCULAR | Status: AC
  Administered 2019-09-15: 1 mg via INTRAVENOUS
  Filled 2019-09-15: qty 1

## 2019-09-15 MED ORDER — ENOXAPARIN SODIUM 40 MG/0.4ML ~~LOC~~ SOLN
40.0000 mg | SUBCUTANEOUS | Status: DC
  Administered 2019-09-17: 40 mg via SUBCUTANEOUS
  Filled 2019-09-15 (×2): qty 0.4

## 2019-09-15 MED ORDER — OXYCODONE HCL 5 MG PO TABS: 10.0000 mg | ORAL_TABLET | Freq: Four times a day (QID) | ORAL | Status: DC | PRN

## 2019-09-15 MED ORDER — MORPHINE SULFATE (PF) 4 MG/ML IV SOLN
4.0000 mg | Freq: Once | INTRAVENOUS | Status: AC
  Administered 2019-09-15: 4 mg via INTRAVENOUS
  Filled 2019-09-15: qty 1

## 2019-09-15 MED ORDER — SODIUM CHLORIDE 0.9 % IV SOLN
1.0000 g | Freq: Three times a day (TID) | INTRAVENOUS | Status: DC
  Administered 2019-09-15 – 2019-09-16 (×3): 1 g via INTRAVENOUS
  Filled 2019-09-15 (×6): qty 1

## 2019-09-15 MED ORDER — BUPRENORPHINE HCL-NALOXONE HCL 2-0.5 MG SL SUBL
2.0000 | SUBLINGUAL_TABLET | Freq: Once | SUBLINGUAL | Status: AC
  Administered 2019-09-15: 2 via SUBLINGUAL
  Filled 2019-09-15: qty 2

## 2019-09-15 NOTE — Progress Notes (Signed)
Pt arrived to the unit. Pt not in distress and tolerated well. Vital signs were taken.

## 2019-09-15 NOTE — Progress Notes (Signed)
*  PRELIMINARY RESULTS* Echocardiogram 2D Echocardiogram has been performed.  Meghan Bullock 09/15/2019, 5:44 PM

## 2019-09-15 NOTE — H&P (Signed)
NAME:  Meghan Bullock, MRN:  [id number], DOB:  [birth year], LOS: 0 ADMISSION DATE:  09/14/2019,  Primary: Patient, No Pcp Per  CHIEF COMPLAINT:  Hand pain  Medical Service: Internal Medicine Teaching Service         Attending Physician: Dr. Reymundo Poll, MD    First Contact: Dr. Ephriam Knuckles Pager: [phone]  Second Contact: Dr. Cleaster Corin Pager: [phone]       After Hours (After 5p/  First Contact Pager: [phone]  weekends / holidays): Second Contact Pager: [phone]    History of present illness   28 year old female with past medical history significant for bipolar disorder, IV drug use and cellulitis of multiple sites who is presenting for evaluation of left hand swelling and pain.  Denies fever.  Implies she believes this is due to IV drug use.  She reports injecting wherever she can.  Last IV drug use occurred on morning prior to admission.  She notes using about 1.5 g of heroin daily.  Past Medical History  Substance abuse including IV drug use Hepatitis C  Home Medications    NONE  Allergies    Allergies as of 09/14/2019 - Review Complete 09/14/2019  Allergen Reaction Noted  . Acetaminophen Itching 06/08/2012    Social History   reports that she has been smoking cigarettes. She has a 5.00 pack-year smoking history. She has never used smokeless tobacco. She reports current drug use. Drugs: IV and Heroin. She reports that she does not drink alcohol.   Family History   No pertinent family history  Significant Hospital Events   10/18 admission ROS  Review of Systems  Constitutional: Negative for chills and fever.  HENT: Negative for sore throat.   Eyes: Negative for blurred vision.  Respiratory: Negative for cough and shortness of breath.   Cardiovascular: Negative for chest pain and palpitations.  Gastrointestinal: Positive for diarrhea, nausea and vomiting.  Genitourinary: Negative for dysuria.  Musculoskeletal: Negative for myalgias.  Neurological: Negative for  dizziness.  Psychiatric/Behavioral: Positive for depression and substance abuse. Negative for suicidal ideas.    Objective   Blood pressure (!) 157/92, pulse 68, temperature 99.1 F (37.3 C), temperature source Oral, resp. rate 16, SpO2 97 %, not currently breastfeeding.    No intake or output data in the 24 hours ending 09/15/19 1009 There were no vitals filed for this visit.  Examination: GENERAL: in no acute distress HEENT: head atraumatic. No conjunctival injection. Nares patent.  CARDIAC: heart RRR.  Slight systolic murmur appreciated over the left and right sternal borders.  No peripheral edema.  Extremities warm. PULMONARY: acyanotic. Lung sounds clear to auscultation. ABDOMEN: soft. Nontender to palpation.  Nondistended. No organomegaly appreciated. NEURO: CN II-XII grossly intact.  Alert and oriented to person, place, time, and situation SKIN: Erythema and edema of the left hand extending to mid forearm.  Fluctuant nodule appreciated on the posterior forearm.  No purulence.  Several punctuated marks over the bilateral hands and forearms.  Several scabs present over her arms and face. PSYCH: A/Ox3.  Flat affect  Consults:  None  Significant Diagnostic Tests:  Left hand x-ray: Edema overlying the dorsum of the metacarpals.  No osseous abnormalities  Labs    CBC Latest Ref Rng & Units 09/15/2019 05/13/2019 05/12/2019  WBC 4.0 - 10.5 K/uL 12.6(H) 15.5(H) 19.6(H)  Hemoglobin 12.0 - 15.0 g/dL 10.0(L) 10.2(L) 10.4(L)  Hematocrit 36.0 - 46.0 % 33.7(L) 33.5(L) 33.5(L)  Platelets 150 - 400 K/uL 302 410(H) 317     Summary  28 year old female presenting for evaluation management of left hand cellulitis likely secondary to IV drug use.  Assessment & Plan:  Active Problems:   Cellulitis of left hand  Left hand cellulitis in setting of IV drug use.  White count 12.6 on admission.  Afebrile.  Vital signs stable.  Unlikely to benefit from incision and debridement for now.  Blood  cultures pending Plan Vancomycin Repeat CBC in a.m. We will continue to monitor fever curve  2/6 systolic heart murmur. patient denies history of being told about having a heart murmur.  Given her history of IV injection and cellulitis, will obtain echo to rule out endocarditis.  Continues telemetry monitoring  Opioid dependence.  Patient admits to injecting approximately 1.5 g of heroin daily.  She notes previously having tried Suboxone and methadone in the past. Plan We will start Suboxone. Opioid withdrawal scale  Best practice:  CODE STATUS: DNR Diet: Regular Micro: Blood cultures pending ABX: Vancomycin DVT for prophylaxis: Lovenox Dispo: Admit patient to Inpatient with expected length of stay greater than 2 midnights.   Mitzi Hansen, MD INTERNAL MEDICINE RESIDENT PGY-1 Pager # [phone] 09/15/19 10:09 AM

## 2019-09-15 NOTE — Progress Notes (Addendum)
Pharmacy Antibiotic Note  Meghan Bullock is a 28 y.o. female admitted on 09/14/2019 with cellulitis.  Pharmacy has been consulted for vancomycin and meropenem dosing. Pt is afebrile and WBC is elevated at 12.6. SCr is WNL and lactic acid is <2. Loading dose of vancomycin already given this morning.   Plan: Vancomycin 1g IV Q12H Meropenem 1gm IV Q8H  F/u renal fxn, C&S, clinical status and peak/trough at SS  Height: 5\' 4"  (162.6 cm) Weight: 160 lb (72.6 kg) IBW/kg (Calculated) : 54.7  Temp (24hrs), Avg:98.6 F (37 C), Min:98.2 F (36.8 C), Max:99.1 F (37.3 C)  Recent Labs  Lab 09/15/19 0741  WBC 12.6*  CREATININE 0.62  LATICACIDVEN 1.5    Estimated Creatinine Clearance: 104.1 mL/min (by C-G formula based on SCr of 0.62 mg/dL).    Allergies  Allergen Reactions  . Acetaminophen Itching    Antimicrobials this admission: Vanc 10/18>> Meropenem 10/18>>  Dose adjustments this admission: N/A  Microbiology results: Pending  Thank you for allowing pharmacy to be a part of this patient's care.  Yehya Brendle, Rande Lawman 09/15/2019 11:01 AM

## 2019-09-15 NOTE — ED Provider Notes (Signed)
[facility]   CSN: [id number] Arrival date & time: 09/14/19  2000     History   Chief Complaint Chief Complaint  Patient presents with  . Hand Swelling    HPI Meghan Bullock is a 28 y.o. female.     Patient is a 28 year old female with past medical history of bipolar disorder, IV drug abuse presenting to the emergency department for left hand swelling.  Patient is a poor historian and reports that the swelling and redness has been going on for "some time".  She reports that she has not injected drugs in this hand in "a while".  She does have track marks throughout the entire arm.  She reports pain and swelling but no fever.  History of cellulitis at multiple sites in the past also requiring drainage surgically.     Past Medical History:  Diagnosis Date  . Anemia   . Anxiety   . Bipolar 1 disorder (HCC)   . Chlamydia   . Hepatitis   . Heroin abuse (HCC)   . Mononucleosis   . Ovarian cyst   . Polysubstance abuse [facility](HCC)     Patient Active Problem List   Diagnosis Date Noted  . Normal labor 06/17/2018  . Postpartum care following vaginal delivery 06/17/2018  . Encounter for induction of labor 06/15/2018  . Iron deficiency anemia 04/30/2018  . Supervision of high risk pregnancy, antepartum 10/25/2017  . Substance abuse affecting pregnancy, antepartum 10/25/2017  . H/O preterm delivery, currently pregnant, first trimester 10/25/2017  . Hepatitis C 10/23/2017  . Preterm premature rupture of membranes (PPROM) with unknown onset of labor 05/12/2014  . Encephalopathy, toxic 10/03/2013  . Benzodiazepine abuse (HCC) 10/03/2013  . Heroin abuse [facility](HCC)     Past Surgical History:  Procedure Laterality Date  . HAND SURGERY    . NO PAST SURGERIES       OB History    Gravida  4   Para  2   Term  1   Preterm  1   AB  2   Living  2     SAB  2   TAB      Ectopic      Multiple  0   Live Births  2            Home Medications    Prior to Admission medications   Not on File    Family History No family history on file.  Social History Social History   Tobacco Use  . Smoking status: Current Every Day Smoker    Packs/day: 1.00    Years: 5.00    Pack years: 5.00    Types: Cigarettes  . Smokeless tobacco: Never Used  Substance Use Topics  . Alcohol use: No  . Drug use: Yes    Types: IV, Heroin    Comment:  heroin       Allergies   Acetaminophen   Review of Systems Review of Systems  Constitutional: Negative for chills and fever.  HENT: Negative for rhinorrhea.   Respiratory: Negative for cough and shortness of breath.   Gastrointestinal: Negative for nausea and vomiting.  Allergic/Immunologic: Positive for immunocompromised state.  Neurological: Negative for dizziness and light-headedness.     Physical Exam Updated Vital Signs BP 135/85 (BP Location: Right Arm)   Pulse 74   Temp 99.1 F (37.3 C) (Oral)   Resp 15   SpO2 100%   Physical Exam Vitals signs and  nursing note reviewed.  Constitutional:      General: She is not in acute distress.    Appearance: Normal appearance. She is not ill-appearing, toxic-appearing or diaphoretic.  HENT:     Head: Normocephalic and atraumatic.     Mouth/Throat:     Mouth: Mucous membranes are moist.  Eyes:     Conjunctiva/sclera: Conjunctivae normal.  Cardiovascular:     Rate and Rhythm: Normal rate and regular rhythm.     Heart sounds: Murmur present.  Pulmonary:     Effort: Pulmonary effort is normal.  Abdominal:     General: Abdomen is flat.  Skin:    General: Skin is dry.     Capillary Refill: Capillary refill takes less than 2 seconds.     Comments: Significant erythema and swelling to the dorsum of the left hand with fluctuance to the skin on the lateral aspect of the hand and wrist.  Additionally there is a separate abscess on the mid forearm about 2-1/2 cm which is fluctuant to palpation.  Neurological:      Mental Status: She is alert.  Psychiatric:        Mood and Affect: Mood normal.      ED Treatments / Results  Labs (all labs ordered are listed, but only abnormal results are displayed) Labs Reviewed  CBC WITH DIFFERENTIAL/PLATELET - Abnormal; Notable for the following components:      Result Value   WBC 12.6 (*)    Hemoglobin 10.0 (*)    HCT 33.7 (*)    MCH 24.6 (*)    MCHC 29.7 (*)    Neutro Abs 9.0 (*)    Abs Immature Granulocytes 0.09 (*)    All other components within normal limits  BASIC METABOLIC PANEL - Abnormal; Notable for the following components:   Glucose, Bld 129 (*)    All other components within normal limits  CULTURE, BLOOD (ROUTINE X 2)  CULTURE, BLOOD (ROUTINE X 2)  SARS CORONAVIRUS 2 (TAT 6-24 HRS)  LACTIC ACID, PLASMA  LACTIC ACID, PLASMA  I-STAT BETA HCG BLOOD, ED ([facility], [facility], [facility] ONLY)    EKG None  Radiology Dg Hand Complete Left  Result Date: 09/14/2019 CLINICAL DATA:  Left hand swelling. EXAM: LEFT HAND - COMPLETE 3+ VIEW COMPARISON:  None. FINDINGS: There is no evidence of fracture or dislocation. There is no evidence of arthropathy or other focal bone abnormality. No bony destructive change or osseous erosions. Generalized soft tissue edema overlying the dorsum of the metacarpals. No soft tissue air or radiopaque foreign body. IMPRESSION: Soft tissue edema overlying the dorsum of the metacarpals. No osseous abnormality. No soft tissue air or radiopaque foreign body. Electronically Signed   By: Keith Rake M.D.   On: 09/14/2019 21:03    Procedures Procedures (including critical care time)  Medications Ordered in ED Medications  vancomycin (VANCOCIN) 1,250 mg in sodium chloride 0.9 % 250 mL IVPB (1,250 mg Intravenous New Bag/Given 09/15/19 0814)  morphine 4 MG/ML injection 4 mg (4 mg Intravenous Given 09/15/19 0756)     Initial Impression / Assessment and Plan / ED Course  I have reviewed the triage vital signs and the nursing notes.   Pertinent labs & imaging results that were available during my care of the patient were reviewed by me and considered in my medical decision making (see chart for details).  Clinical Course as of Sep 14 918  Sun Sep 15, 2019  0828 Patient was seen by myself as well as PA  provider..  Briefly this is a 28 year old female with a history of IV fentanyl usage, most recently using earlier this morning, reporting she uses 1-1/2 g of fentanyl daily, presenting to the emergency department abscesses and pain in her hands.  She reports she has abscesses on the dorsal surface of her bilateral hands near her injection sites.  She says been going on for about a week.  She denies any fevers or chills.  She denies any chest pain or shortness of breath.  She denies any drug allergies.  On my exam the patient is afebrile with normal vital signs.  She is sleepy during the exam and continues to drift off despite being awoken multiple times.  (She tells me she most recently used drugs earlier this morning).  She has pinpoint pupils.  She does have a faint murmur over the tricuspid valve.  Her lungs are clear to auscultation.  She has a 3 cm fluctuant abscess in her left mid forearm.  She also has a 2 to 3 cm abscess over the dorsal ulnar aspect of her left hand.  She has no signs or symptoms of flexor tenosynovitis in either hand.  She has a smaller abscess on the dorsum of her right hand.  She also has multiple needle track marks in her bilateral arms and hands.  Plan is to draw blood cultures, initiate IV vancomycin.  She does have a mild leukocytosis.  Anticipate admission for echocardiogram given her cardiac murmur.  She may need hand consult for drainage of her multiple abscesses as an inpatient.   [MT]  0831 This note was dictated using dragon dictation software.  Please be aware that there may be minor translation errors as a result of this oral dictation     [MT]  0831 I spoke to the patient about a possible  prolonged hospital course and IV antibiotics if she ends up having endocarditis or bloodstream infection.  I explained she will likely begin going into withdrawal.  We will try to manage her symptoms and attempt to transition her to methadone as an inpatient.  I asked her if she understands and is willing to stay through this hospitalization.  She states that she is   [MT]  [phone] Spoke with unassigned hospitalist service to admit patient for cellulitis and endocarditis workup due to new murmur.    [KM]    Clinical Course User Index [KM] Arlyn Dunning, PA-C [MT] Terald Sleeper, MD       The patient appears reasonably stabilized for admission considering the current resources, flow, and capabilities available in the ED at this time, and I doubt any other [facility] requiring further screening and/or treatment in the ED prior to admission.   Final Clinical Impressions(s) / ED Diagnoses   Final diagnoses:  Cellulitis and abscess of hand  IV drug user  Newly recognized heart murmur    ED Discharge Orders    None       Terald Sleeper, MD 09/17/19 1131

## 2019-09-16 ENCOUNTER — Inpatient Hospital Stay (HOSPITAL_COMMUNITY): Payer: Self-pay | Admitting: Certified Registered Nurse Anesthetist

## 2019-09-16 ENCOUNTER — Encounter (HOSPITAL_COMMUNITY): Payer: Self-pay

## 2019-09-16 ENCOUNTER — Encounter (HOSPITAL_COMMUNITY)
Admission: EM | Discharge status: Against Medical Advice | Payer: Self-pay | Source: Home / Self Care | Attending: Internal Medicine

## 2019-09-16 DIAGNOSIS — L03114 Cellulitis of left upper limb: Secondary | ICD-10-CM

## 2019-09-16 DIAGNOSIS — F1120 Opioid dependence, uncomplicated: Secondary | ICD-10-CM

## 2019-09-16 HISTORY — PX: I&D EXTREMITY: SHX5045

## 2019-09-16 LAB — COMPREHENSIVE METABOLIC PANEL
ALT: 43 U/L (ref 0–44)
AST: 29 U/L (ref 15–41)
Albumin: 2.9 g/dL — ABNORMAL LOW (ref 3.5–5.0)
Alkaline Phosphatase: 72 U/L (ref 38–126)
Anion gap: 11 (ref 5–15)
BUN: 5 mg/dL — ABNORMAL LOW (ref 6–20)
CO2: 23 mmol/L (ref 22–32)
Calcium: 8.9 mg/dL (ref 8.9–10.3)
Chloride: 104 mmol/L (ref 98–111)
Creatinine, Ser: 0.59 mg/dL (ref 0.44–1.00)
GFR calc Af Amer: >60 mL/min (ref >60)
GFR calc non Af Amer: >60 mL/min (ref >60)
Glucose, Bld: 101 mg/dL — ABNORMAL HIGH (ref 70–99)
Potassium: 3.6 mmol/L (ref 3.5–5.1)
Sodium: 138 mmol/L (ref 135–145)
Total Bilirubin: 0.6 mg/dL (ref 0.3–1.2)
Total Protein: 6.7 g/dL (ref 6.5–8.1)

## 2019-09-16 LAB — CBC WITH DIFFERENTIAL/PLATELET
Abs Immature Granulocytes: 0.07 10*3/uL (ref 0.00–0.07)
Basophils Absolute: 0 10*3/uL (ref 0.0–0.1)
Basophils Relative: 0 %
Eosinophils Absolute: 0.1 10*3/uL (ref 0.0–0.5)
Eosinophils Relative: 1 %
HCT: 34.4 % — ABNORMAL LOW (ref 36.0–46.0)
Hemoglobin: 11.1 g/dL — ABNORMAL LOW (ref 12.0–15.0)
Immature Granulocytes: 1 %
Lymphocytes Relative: 22 %
Lymphs Abs: 2.4 10*3/uL (ref 0.7–4.0)
MCH: 25.3 pg — ABNORMAL LOW (ref 26.0–34.0)
MCHC: 32.3 g/dL (ref 30.0–36.0)
MCV: 78.5 fL — ABNORMAL LOW (ref 80.0–100.0)
Monocytes Absolute: 0.7 10*3/uL (ref 0.1–1.0)
Monocytes Relative: 6 %
Neutro Abs: 7.6 10*3/uL (ref 1.7–7.7)
Neutrophils Relative %: 70 %
Platelets: 311 10*3/uL (ref 150–400)
RBC: 4.38 MIL/uL (ref 3.87–5.11)
RDW: 13.8 % (ref 11.5–15.5)
WBC: 11 10*3/uL — ABNORMAL HIGH (ref 4.0–10.5)
nRBC: 0 % (ref 0.0–0.2)

## 2019-09-16 LAB — MRSA PCR SCREENING: MRSA by PCR: POSITIVE — AB

## 2019-09-16 SURGERY — IRRIGATION AND DEBRIDEMENT EXTREMITY
Anesthesia: Monitor Anesthesia Care | Laterality: Left

## 2019-09-16 MED ORDER — CHLORHEXIDINE GLUCONATE 4 % EX LIQD: 60.0000 mL | Freq: Once | CUTANEOUS | Status: DC

## 2019-09-16 MED ORDER — MIDAZOLAM HCL 2 MG/2ML IJ SOLN
INTRAMUSCULAR | Status: AC
  Filled 2019-09-16: qty 2

## 2019-09-16 MED ORDER — OXYCODONE-ACETAMINOPHEN 5-325 MG PO TABS: 1.0000 | ORAL_TABLET | ORAL | Status: DC | PRN

## 2019-09-16 MED ORDER — LACTATED RINGERS IV SOLN
INTRAVENOUS | Status: DC | PRN
  Administered 2019-09-16: 18:00:00 via INTRAVENOUS

## 2019-09-16 MED ORDER — CLONIDINE HCL (ANALGESIA) 100 MCG/ML EP SOLN
EPIDURAL | Status: DC | PRN
  Administered 2019-09-16: 100 ug

## 2019-09-16 MED ORDER — MUPIROCIN 2 % EX OINT
1.0000 "application " | TOPICAL_OINTMENT | Freq: Two times a day (BID) | CUTANEOUS | Status: DC
  Administered 2019-09-16 – 2019-09-17 (×3): 1 via NASAL
  Filled 2019-09-16 (×2): qty 22

## 2019-09-16 MED ORDER — BUPIVACAINE HCL (PF) 0.25 % IJ SOLN
INTRAMUSCULAR | Status: AC
  Filled 2019-09-16: qty 30

## 2019-09-16 MED ORDER — PROPOFOL 10 MG/ML IV BOLUS
INTRAVENOUS | Status: DC | PRN
  Administered 2019-09-16: 30 mg via INTRAVENOUS
  Administered 2019-09-16: 20 mg via INTRAVENOUS

## 2019-09-16 MED ORDER — HYDROMORPHONE HCL 1 MG/ML IJ SOLN
1.0000 mg | INTRAMUSCULAR | Status: DC | PRN
  Administered 2019-09-16: 1 mg via INTRAVENOUS
  Filled 2019-09-16: qty 1

## 2019-09-16 MED ORDER — CEFAZOLIN SODIUM-DEXTROSE 2-4 GM/100ML-% IV SOLN
INTRAVENOUS | Status: AC
  Filled 2019-09-16: qty 100

## 2019-09-16 MED ORDER — CEFAZOLIN SODIUM-DEXTROSE 2-4 GM/100ML-% IV SOLN
2.0000 g | INTRAVENOUS | Status: AC
  Administered 2019-09-16: 2 g via INTRAVENOUS

## 2019-09-16 MED ORDER — SODIUM CHLORIDE 0.9 % IV SOLN: 2.0000 g | INTRAVENOUS | Status: DC

## 2019-09-16 MED ORDER — MIDAZOLAM HCL 2 MG/2ML IJ SOLN
INTRAMUSCULAR | Status: AC
  Administered 2019-09-16: 2 mg via INTRAVENOUS
  Filled 2019-09-16: qty 2

## 2019-09-16 MED ORDER — HYDROMORPHONE HCL 1 MG/ML IJ SOLN
1.0000 mg | INTRAMUSCULAR | Status: DC | PRN
  Administered 2019-09-16 – 2019-09-17 (×5): 1 mg via INTRAVENOUS
  Filled 2019-09-16 (×5): qty 1

## 2019-09-16 MED ORDER — HYDROMORPHONE HCL 1 MG/ML IJ SOLN
INTRAMUSCULAR | Status: AC
  Filled 2019-09-16: qty 1

## 2019-09-16 MED ORDER — FENTANYL CITRATE (PF) 100 MCG/2ML IJ SOLN
INTRAMUSCULAR | Status: AC
  Administered 2019-09-16: 100 ug via INTRAVENOUS
  Filled 2019-09-16: qty 2

## 2019-09-16 MED ORDER — PROPOFOL 10 MG/ML IV BOLUS
INTRAVENOUS | Status: AC
  Filled 2019-09-16: qty 20

## 2019-09-16 MED ORDER — PROPOFOL 500 MG/50ML IV EMUL
INTRAVENOUS | Status: DC | PRN
  Administered 2019-09-16: 150 ug/kg/min via INTRAVENOUS

## 2019-09-16 MED ORDER — ONDANSETRON HCL 4 MG/2ML IJ SOLN
4.0000 mg | Freq: Once | INTRAMUSCULAR | Status: AC
  Administered 2019-09-16: 4 mg via INTRAVENOUS
  Filled 2019-09-16: qty 2

## 2019-09-16 MED ORDER — 0.9 % SODIUM CHLORIDE (POUR BTL) OPTIME
TOPICAL | Status: DC | PRN
  Administered 2019-09-16: 1000 mL

## 2019-09-16 MED ORDER — CHLORHEXIDINE GLUCONATE CLOTH 2 % EX PADS: 6.0000 | MEDICATED_PAD | Freq: Every day | CUTANEOUS | Status: DC

## 2019-09-16 MED ORDER — FENTANYL CITRATE (PF) 100 MCG/2ML IJ SOLN
100.0000 ug | Freq: Once | INTRAMUSCULAR | Status: AC
  Administered 2019-09-16: 17:00:00 100 ug via INTRAVENOUS

## 2019-09-16 MED ORDER — POVIDONE-IODINE 10 % EX SWAB: 2.0000 "application " | Freq: Once | CUTANEOUS | Status: DC

## 2019-09-16 MED ORDER — SODIUM CHLORIDE 0.9 % IR SOLN
Status: DC | PRN
  Administered 2019-09-16: 3000 mL

## 2019-09-16 MED ORDER — MIDAZOLAM HCL 2 MG/2ML IJ SOLN
2.0000 mg | Freq: Once | INTRAMUSCULAR | Status: AC
  Administered 2019-09-16: 17:00:00 2 mg via INTRAVENOUS

## 2019-09-16 MED ORDER — ROPIVACAINE HCL 7.5 MG/ML IJ SOLN
INTRAMUSCULAR | Status: DC | PRN
  Administered 2019-09-16 (×4): 5 mL via PERINEURAL

## 2019-09-16 MED ORDER — FENTANYL CITRATE (PF) 250 MCG/5ML IJ SOLN
INTRAMUSCULAR | Status: AC
  Filled 2019-09-16: qty 5

## 2019-09-16 SURGICAL SUPPLY — 46 items
BNDG ADH 5X4 AIR PERM ELC (GAUZE/BANDAGES/DRESSINGS) ×1
BNDG COHESIVE 4X5 WHT NS (GAUZE/BANDAGES/DRESSINGS) ×3 IMPLANT
BNDG CONFORM 2 STRL LF (GAUZE/BANDAGES/DRESSINGS) IMPLANT
BNDG ELASTIC 4X5.8 VLCR STR LF (GAUZE/BANDAGES/DRESSINGS) ×3 IMPLANT
BNDG GAUZE ELAST 4 BULKY (GAUZE/BANDAGES/DRESSINGS) ×3 IMPLANT
CORD BIPOLAR FORCEPS 12FT (ELECTRODE) ×3 IMPLANT
COVER SURGICAL LIGHT HANDLE (MISCELLANEOUS) ×3 IMPLANT
COVER WAND RF STERILE (DRAPES) ×3 IMPLANT
CUFF TOURN SGL QUICK 18X4 (TOURNIQUET CUFF) ×3 IMPLANT
CUFF TOURN SGL QUICK 24 (TOURNIQUET CUFF)
CUFF TRNQT CYL 24X4X16.5-23 (TOURNIQUET CUFF) IMPLANT
DRAIN PENROSE 1/4X12 LTX STRL (WOUND CARE) ×3 IMPLANT
DRSG ADAPTIC 3X8 NADH LF (GAUZE/BANDAGES/DRESSINGS) ×3 IMPLANT
GAUZE SPONGE 4X4 12PLY STRL (GAUZE/BANDAGES/DRESSINGS) ×3 IMPLANT
GAUZE SPONGE 4X4 16PLY XRAY LF (GAUZE/BANDAGES/DRESSINGS) ×3 IMPLANT
GAUZE XEROFORM 1X8 LF (GAUZE/BANDAGES/DRESSINGS) ×3 IMPLANT
GAUZE XEROFORM 5X9 LF (GAUZE/BANDAGES/DRESSINGS) ×3 IMPLANT
GLOVE BIO SURGEON STRL SZ7.5 (GLOVE) ×3 IMPLANT
GLOVE BIOGEL PI IND STRL 8 (GLOVE) ×1 IMPLANT
GLOVE BIOGEL PI INDICATOR 8 (GLOVE) ×2
GOWN STRL REUS W/ TWL LRG LVL3 (GOWN DISPOSABLE) ×1 IMPLANT
GOWN STRL REUS W/ TWL XL LVL3 (GOWN DISPOSABLE) ×2 IMPLANT
GOWN STRL REUS W/TWL LRG LVL3 (GOWN DISPOSABLE) ×3
GOWN STRL REUS W/TWL XL LVL3 (GOWN DISPOSABLE) ×6
KIT BASIN OR (CUSTOM PROCEDURE TRAY) ×3 IMPLANT
KIT TURNOVER KIT B (KITS) ×3 IMPLANT
MANIFOLD NEPTUNE II (INSTRUMENTS) ×3 IMPLANT
NEEDLE HYPO 25GX1X1/2 BEV (NEEDLE) IMPLANT
NS IRRIG 1000ML POUR BTL (IV SOLUTION) ×3 IMPLANT
PACK ORTHO EXTREMITY (CUSTOM PROCEDURE TRAY) ×3 IMPLANT
PAD ARMBOARD 7.5X6 YLW CONV (MISCELLANEOUS) ×3 IMPLANT
PAD CAST 4YDX4 CTTN HI CHSV (CAST SUPPLIES) ×1 IMPLANT
PADDING CAST COTTON 4X4 STRL (CAST SUPPLIES) ×3
SET CYSTO W/LG BORE CLAMP LF (SET/KITS/TRAYS/PACK) ×6 IMPLANT
SOL PREP POV-IOD 4OZ 10% (MISCELLANEOUS) ×6 IMPLANT
SPONGE LAP 4X18 RFD (DISPOSABLE) ×3 IMPLANT
SWAB COLLECTION DEVICE MRSA (MISCELLANEOUS) ×6 IMPLANT
SWAB CULTURE ESWAB REG 1ML (MISCELLANEOUS) IMPLANT
SYR CONTROL 10ML LL (SYRINGE) IMPLANT
TOWEL GREEN STERILE (TOWEL DISPOSABLE) ×3 IMPLANT
TOWEL GREEN STERILE FF (TOWEL DISPOSABLE) ×3 IMPLANT
TUBE CONNECTING 12'X1/4 (SUCTIONS) ×1
TUBE CONNECTING 12X1/4 (SUCTIONS) ×2 IMPLANT
UNDERPAD 30X30 (UNDERPADS AND DIAPERS) ×3 IMPLANT
WATER STERILE IRR 1000ML POUR (IV SOLUTION) ×3 IMPLANT
YANKAUER SUCT BULB TIP NO VENT (SUCTIONS) ×3 IMPLANT

## 2019-09-16 NOTE — Progress Notes (Signed)
NAME:  Meghan Bullock, MRN:  [id number], DOB:  March 10, 1991, LOS: 1 ADMISSION DATE:  09/14/2019, Primary: Patient, No Pcp Per  CHIEF COMPLAINT:  Left hand pain  Medical Service: Internal Medicine Teaching Service         Attending Physician: Dr. Velna Ochs, MD    First Contact: Dr. Darrick Meigs Pager: [phone]  Second Contact: Dr. Sharon Seller Pager: [phone]       After Hours (After 5p/  First Contact Pager: [phone]  weekends / holidays): Second Contact Pager: [phone]    Brief History  28 yo female with pertinent PMH of IVDU presenting with left hand pain. Leukocytosis present on admission and found to have left hand cellulitis.  Subjective  Continues to have pain in the left hand. Denies significant withdrawal sx.  Objective   Blood pressure (!) 142/89, pulse (!) 54, temperature 98.6 F (37 C), temperature source Oral, resp. rate 17, height 5\' 4"  (1.626 m), weight 72.6 kg, SpO2 100 %, not currently breastfeeding.     Intake/Output Summary (Last 24 hours) at 09/16/2019 0523 Last data filed at 09/15/2019 1010 Gross per 24 hour  Intake 250 ml  Output -  Net 250 ml   Filed Weights   09/15/19 1100  Weight: 72.6 kg    Examination: GENERAL: in no acute distress CARDIAC: heart RRR. Systolic murmur appreciated over left and right parasternals PULMONARY: acyanotic. Lung sounds clear to auscultation. ABDOMEN: soft. Nontender to palpation.  NEURO: CN II-XII grossly intact. SKIN: fluctuant wounds to the dorsal aspect of the hand and forearm. No drainage. Several punctuate marks on bilateral hands and arms.   Consults:  Hand Surgery Consults:  10/19: I&D left hand abscesses  Significant Diagnostic Tests:  10/17 Left hand x-ray: Edema overlying the dorsum of the metacarpals.  No osseous abnormalities  10/18 echo: neg for valvular abnormalities  Micro Data:  10/18 blood cultures>>  Antimicrobials:  Vancomycin 10/18>> Merrem>>  Labs    CBC Latest Ref Rng & Units  09/15/2019 05/13/2019 05/12/2019  WBC 4.0 - 10.5 K/uL 12.6(H) 15.5(H) 19.6(H)  Hemoglobin 12.0 - 15.0 g/dL 10.0(L) 10.2(L) 10.4(L)  Hematocrit 36.0 - 46.0 % 33.7(L) 33.5(L) 33.5(L)  Platelets 150 - 400 K/uL 302 410(H) 317    Summary  28 yo female with history of IVDU presented to ED on 10/18 for left hand pain and found to have cellulitis with abscess formation of the left hand and forearm. Surgery consulted and taken to OR for I&D on 10/19.  Assessment & Plan:  Active Problems:   Cellulitis of left hand  Left hand cellulitis in setting of IV drug use.  White count 12.6 on admission.  Afebrile.  Vital signs stable.  White count stable--11.0 Blood cultures pending. OR today for I&D.  Plan D/c merrem. Will start rocephin. Continue vanc. De-escalation pending culture results. Repeat CBC in a.m. We will continue to monitor fever curve Pain management with po percocet. Dilaudid for breakthrough pain.  2/6 systolic heart murmur. patient denies history of being told about having a heart murmur.  Echo 10/18 neg for valvular abnormalities or vegetations  Opioid dependence.  Patient admits to injecting approximately 1.5 g of heroin daily.  She notes previously having tried Suboxone and methadone in the past. Plan D/c suboxone as pt will need post operative pain control Opioid withdrawal scale Can discuss her plan for cessation prior to discharge.  Best practice:  CODE STATUS: DNR Diet:NPO  Pain management: percocet. Dilaudid for breakthrough pain. DVT for prophylaxis: lovenox Dispo: pending  culture results and medical stabilization  Elige Radon, MD INTERNAL MEDICINE RESIDENT PGY-1 PAGER #: [phone] 09/16/19 5:23 AM

## 2019-09-16 NOTE — Plan of Care (Signed)

## 2019-09-16 NOTE — Consult Note (Signed)
Reason for Consult:Left arm abscesses Referring Physician: C Guilloud  Meghan Bullock is an 28 y.o. female.  HPI: Meghan Bullock is an IVDU with a 1 week hx/o pain and swelling of the left FA and hand. It's been relatively stable over the last couple of days. No drainage. She has had multiple similar problems like this in the past and has undergone surgery for some in the [facility] system. She was admitted yesterday and hand surgery was consulted today. She is RHD.  Past Medical History:  Diagnosis Date  . Anemia   . Anxiety   . Bipolar 1 disorder ([place])   . Chlamydia   . Hepatitis   . Heroin abuse ([place])   . Mononucleosis   . Ovarian cyst   . Polysubstance abuse [facility])     Past Surgical History:  Procedure Laterality Date  . HAND SURGERY    . NO PAST SURGERIES      No family history on file.  Social History:  reports that she has been smoking cigarettes. She has a 5.00 pack-year smoking history. She has never used smokeless tobacco. She reports current drug use. Drugs: IV and Heroin. She reports that she does not drink alcohol.  Allergies:  Allergies  Allergen Reactions  . Acetaminophen Itching    Medications: I have reviewed the patient's current medications.  Results for orders placed or performed during the hospital encounter of 09/14/19 (from the past 48 hour(s))  CBC with Differential     Status: Abnormal   Collection Time: 09/15/19  7:41 AM  Result Value Ref Range   WBC 12.6 (H) 4.0 - 10.5 K/uL   RBC 4.07 3.87 - 5.11 MIL/uL   Hemoglobin 10.0 (L) 12.0 - 15.0 g/dL   HCT 33.7 (L) 36.0 - 46.0 %   MCV 82.8 80.0 - 100.0 fL   MCH 24.6 (L) 26.0 - 34.0 pg   MCHC 29.7 (L) 30.0 - 36.0 g/dL   RDW 14.1 11.5 - 15.5 %   Platelets 302 150 - 400 K/uL   nRBC 0.0 0.0 - 0.2 %   Neutrophils Relative % 72 %   Neutro Abs 9.0 (H) 1.7 - 7.7 K/uL   Lymphocytes Relative 17 %   Lymphs Abs 2.1 0.7 - 4.0 K/uL   Monocytes Relative 8 %   Monocytes Absolute 1.0 0.1 - 1.0 K/uL   Eosinophils Relative 2 %    Eosinophils Absolute 0.3 0.0 - 0.5 K/uL   Basophils Relative 0 %   Basophils Absolute 0.1 0.0 - 0.1 K/uL   Immature Granulocytes 1 %   Abs Immature Granulocytes 0.09 (H) 0.00 - 0.07 K/uL    Comment: Performed at [facility], [address] [place], [place] [address]  Basic metabolic panel     Status: Abnormal   Collection Time: 09/15/19  7:41 AM  Result Value Ref Range   Sodium 138 135 - 145 mmol/L   Potassium 3.8 3.5 - 5.1 mmol/L   Chloride 104 98 - 111 mmol/L   CO2 25 22 - 32 mmol/L   Glucose, Bld 129 (H) 70 - 99 mg/dL   BUN 8 6 - 20 mg/dL   Creatinine, Ser 0.62 0.44 - 1.00 mg/dL   Calcium 8.9 8.9 - 10.3 mg/dL   GFR calc non Af Amer >60 >60 mL/min   GFR calc Af Amer >60 >60 mL/min   Anion gap 9 5 - 15    Comment: Performed at [facility], [place] [address], [place], [place]  [address]  Lactic acid, plasma     Status: None   Collection Time: 09/15/19  7:41 AM  Result Value Ref Range   Lactic Acid, Venous 1.5 0.5 - 1.9 mmol/L    Comment: Performed at [facility], [address] [place], [place] [address]  Blood culture (routine x 2)     Status: None (Preliminary result)   Collection Time: 09/15/19  7:50 AM   Specimen: BLOOD  Result Value Ref Range   Specimen Description BLOOD RIGHT ANTECUBITAL    Special Requests      BOTTLES DRAWN AEROBIC AND ANAEROBIC Blood Culture adequate volume   Culture      NO GROWTH 1 DAY Performed at [facility], [address] [place], [place] [address]    Report Status PENDING   Blood culture (routine x 2)     Status: None (Preliminary result)   Collection Time: 09/15/19  7:53 AM   Specimen: BLOOD RIGHT FOREARM  Result Value Ref Range   Specimen Description BLOOD RIGHT FOREARM    Special Requests      BOTTLES DRAWN AEROBIC AND ANAEROBIC Blood Culture adequate volume   Culture      NO GROWTH 1 DAY Performed at [facility], [address], [place], [place] [address]    Report Status PENDING   I-Stat Beta  hCG blood, ED ([facility], [facility], [facility] only)     Status: None   Collection Time: 09/15/19  8:00 AM  Result Value Ref Range   I-stat hCG, quantitative <5.0 <5 mIU/mL   Comment 3            Comment:   GEST. AGE      CONC.  (mIU/mL)   <=1 WEEK        5 - 50     2 WEEKS       50 - 500     3 WEEKS       100 - 10,000     4 WEEKS     1,000 - 30,000        FEMALE AND NON-PREGNANT FEMALE:     LESS THAN 5 mIU/mL   HIV Antibody (routine testing w rflx)     Status: None   Collection Time: 09/15/19 12:11 PM  Result Value Ref Range   HIV Screen 4th Generation wRfx NON REACTIVE NON REACTIVE    Comment: Performed at [facility], [address] [place], [place] [address]  SARS CORONAVIRUS 2 (TAT 6-24 HRS) Nasopharyngeal Nasopharyngeal Swab     Status: None   Collection Time: 09/15/19  1:30 PM   Specimen: Nasopharyngeal Swab  Result Value Ref Range   SARS Coronavirus 2 NEGATIVE NEGATIVE    Comment: (NOTE) SARS-CoV-2 target nucleic acids are NOT DETECTED. The SARS-CoV-2 RNA is generally detectable in upper and lower respiratory specimens during the acute phase of infection. Negative results do not preclude SARS-CoV-2 infection, do not rule out co-infections with other pathogens, and should not be used as the sole basis for treatment or other patient management decisions. Negative results must be combined with clinical observations, patient history, and epidemiological information. The expected result is Negative. Fact Sheet for Patients: HairSlick.no Fact Sheet for Healthcare Providers: quierodirigir.com This test is not yet approved or cleared by the [place] FDA and  has been authorized for detection and/or diagnosis of SARS-CoV-2 by FDA under an Emergency Use Authorization (EUA). This EUA will remain  in effect (meaning this test  can be used) for the duration of the COVID-19 declaration under Section 56 4(b)(1) of the Act, 21 U.S.C.  section 360bbb-3(b)(1), unless the authorization is terminated or revoked sooner. Performed at [facility], [address] [place], [place] [address]   MRSA PCR Screening     Status: Abnormal   Collection Time: 09/16/19  2:55 AM   Specimen: Nasal Mucosa; Nasopharyngeal  Result Value Ref Range   MRSA by PCR POSITIVE (A) NEGATIVE    Comment:        The GeneXpert MRSA Assay (FDA approved for NASAL specimens only), is one component of a comprehensive MRSA colonization surveillance program. It is not intended to diagnose MRSA infection nor to guide or monitor treatment for MRSA infections. RESULT CALLED TO, READ BACK BY AND VERIFIED WITH: NASSER,E RN [phone] 09/16/2019 MITCHELL,L Performed at [facility], [address] [place], [place] [address]   CBC with Differential/Platelet     Status: Abnormal   Collection Time: 09/16/19  5:44 AM  Result Value Ref Range   WBC 11.0 (H) 4.0 - 10.5 K/uL   RBC 4.38 3.87 - 5.11 MIL/uL   Hemoglobin 11.1 (L) 12.0 - 15.0 g/dL   HCT 19.134.4 (L) 47.836.0 - 29.546.0 %   MCV 78.5 (L) 80.0 - 100.0 fL   MCH 25.3 (L) 26.0 - 34.0 pg   MCHC 32.3 30.0 - 36.0 g/dL   RDW 62.113.8 30.811.5 - 65.715.5 %   Platelets 311 150 - 400 K/uL   nRBC 0.0 0.0 - 0.2 %   Neutrophils Relative % 70 %   Neutro Abs 7.6 1.7 - 7.7 K/uL   Lymphocytes Relative 22 %   Lymphs Abs 2.4 0.7 - 4.0 K/uL   Monocytes Relative 6 %   Monocytes Absolute 0.7 0.1 - 1.0 K/uL   Eosinophils Relative 1 %   Eosinophils Absolute 0.1 0.0 - 0.5 K/uL   Basophils Relative 0 %   Basophils Absolute 0.0 0.0 - 0.1 K/uL   Immature Granulocytes 1 %   Abs Immature Granulocytes 0.07 0.00 - 0.07 K/uL    Comment: Performed at [facility], [address] [place], [place] [address]  Comprehensive metabolic panel     Status: Abnormal   Collection Time: 09/16/19  5:44 AM  Result Value Ref Range   Sodium 138 135 - 145 mmol/L   Potassium 3.6 3.5 - 5.1 mmol/L   Chloride 104 98 - 111 mmol/L   CO2 23 22 - 32  mmol/L   Glucose, Bld 101 (H) 70 - 99 mg/dL   BUN 5 (L) 6 - 20 mg/dL   Creatinine, Ser 2.950.59 0.44 - 1.00 mg/dL   Calcium 8.9 8.9 - 28.410.3 mg/dL   Total Protein 6.7 6.5 - 8.1 g/dL   Albumin 2.9 (L) 3.5 - 5.0 g/dL   AST 29 15 - 41 U/L   ALT 43 0 - 44 U/L   Alkaline Phosphatase 72 38 - 126 U/L   Total Bilirubin 0.6 0.3 - 1.2 mg/dL   GFR calc non Af Amer >60 >60 mL/min   GFR calc Af Amer >60 >60 mL/min   Anion gap 11 5 - 15    Comment: Performed at [facility], [address] [place], [place] [address]    Dg Hand Complete Left  Result Date: 09/14/2019 CLINICAL DATA:  Left hand swelling. EXAM: LEFT HAND - COMPLETE 3+ VIEW COMPARISON:  None. FINDINGS: There is no evidence of fracture or dislocation. There is  no evidence of arthropathy or other focal bone abnormality. No bony destructive change or osseous erosions. Generalized soft tissue edema overlying the dorsum of the metacarpals. No soft tissue air or radiopaque foreign body. IMPRESSION: Soft tissue edema overlying the dorsum of the metacarpals. No osseous abnormality. No soft tissue air or radiopaque foreign body. Electronically Signed   By: Narda Rutherford M.D.   On: 09/14/2019 21:03    Review of Systems  Constitutional: Negative for chills, fever and weight loss.  HENT: Negative for ear discharge, ear pain, hearing loss and tinnitus.   Eyes: Negative for blurred vision, double vision, photophobia and pain.  Respiratory: Negative for cough, sputum production and shortness of breath.   Cardiovascular: Negative for chest pain.  Gastrointestinal: Negative for abdominal pain, nausea and vomiting.  Genitourinary: Negative for dysuria, flank pain, frequency and urgency.  Musculoskeletal: Positive for myalgias (Left FA/hand). Negative for back pain, falls, joint pain and neck pain.  Neurological: Negative for dizziness, tingling, sensory change, focal weakness, loss of consciousness and headaches.  Endo/Heme/Allergies: Does not  bruise/bleed easily.  Psychiatric/Behavioral: Negative for depression, memory loss and substance abuse. The patient is not nervous/anxious.    Blood pressure (!) 133/95, pulse (!) 58, temperature 98.4 F (36.9 C), temperature source Oral, resp. rate 18, height  (1.626 m), weight 72.6 kg, SpO2 100 %, not currently breastfeeding. Physical Exam  Constitutional: She appears well-developed and well-nourished. No distress.  HENT:  Head: Normocephalic and atraumatic.  Eyes: Conjunctivae are normal. Right eye exhibits no discharge. Left eye exhibits no discharge. No scleral icterus.  Neck: Normal range of motion.  Cardiovascular: Normal rate and regular rhythm.  Respiratory: Effort normal. No respiratory distress.  Musculoskeletal:     Comments: Left shoulder, elbow, wrist, digits- no skin wounds, dorsal FA, hand with fluctuant masses, mod TTP, no instability, no blocks to motion  Sens  Ax/R/M/U intact  Mot   Ax/ R/ PIN/ M/ AIN/ U intact  Rad 2+  Neurological: She is alert.  Skin: Skin is warm and dry. She is not diaphoretic.  Psychiatric: She has a normal mood and affect. Her behavior is normal.      Assessment/Plan: Left FA/hand abscesses -- Plan OR I&D by Dr. Roney Mans this afternoon. Please keep NPO. Heart murmur IVDU    Freeman Caldron, PA-C Orthopedic Surgery [phone] 09/16/2019, 11:29 AM

## 2019-09-16 NOTE — Op Note (Signed)
PREOPERATIVE DIAGNOSIS:  1.  Left dorsal hand abscess 2.  Left dorsal forearm abscess  POSTOPERATIVE DIAGNOSIS: Same  ATTENDING PHYSICIAN: Maudry Mayhew. Jeannie Fend, III, MD who was present and scrubbed for the entire case   ASSISTANT SURGEON: None.   ANESTHESIA: Regional with MAC  SURGICAL PROCEDURES: 1.  Left dorsal hand abscess I&D 2.  Left dorsal forearm abscess I&D  SURGICAL INDICATIONS: Patient is a 28 year old female with a known history of past IV drug use.  She reported approximate 1 week history of left hand pain and swelling.  She was subsequently seen and evaluated in the ER where she was found to have significant cellulitis to the left forearm.  She was subsequently admitted to the hospitalist service where she then went on to develop abscesses at the dorsal hand and dorsal forearm.  After discussing treatment options including continued antibiotic treatment or proceeding forward with surgical intervention the patient did wish to proceed with operative I&D.  She presents today for that.  FINDING: There was a large dorsal subcutaneous abscess on the dorsal hand overlying the fourth and fifth metacarpals.  This was successfully I&D and cultured.  There was a second large dorsal abscess along the midshaft of the forearm which was also subcutaneous.  This was successfully I&D it and cultured.  DESCRIPTION OF PROCEDURE: The patient was identified in the preoperative holding area where the risk benefits and alternatives of the procedure were discussed with the patient.  These include but are not limited to infection, bleeding, damage to surrounding structures including blood vessels and nerves, pain, stiffness, recurrence and need for additional procedures.  Informed consent was obtained at that time.  The patient's left arm was marked with a surgical marking pen.  She then underwent a left upper extremity plexus block by anesthesia.  She was then brought to the operative suite where a timeout  was performed identifying the correct patient operative site.  She was positioned supine on the operative table with her hand outstretched on a hand table.  A tourniquet was placed on the upper arm and the patient was induced under MAC sedation.  The arm was then prepped and draped in usual sterile fashion.  The limb was exsanguinated by gravity and the tourniquet was inflated.  A 15 blade was used to incise the dorsal forearm abscess first.  This was made directly over top of the abscess.  Upon incising the skin there was a large blush of pus which was cultured for both aerobic and anaerobic bacteria.  The wound was gently debrided using a curette taking care to protect the overlying skin.  There were no foreign bodies noticed within the wound.  Attention was then turned to the dorsal hand wound.  There is also a large abscess over the fourth and fifth metacarpals.  15 blade was used to incise this and there was once again a blush of abundant pus which was cultured for both aerobic and anaerobic bacteria.  This area was also gently debrided using a curette.  Both wounds were then copiously irrigated with 3 L of normal saline through cystoscopy tubing.  A Penrose drain was placed into both wounds and the wounds were gently, loosely closed around both of these.  Xeroform, 4 x 4's and a well-padded soft dressing were then applied.  The tourniquet was released and the patient had return of brisk capillary refill to all of her digits.  She was awoken from her sedation and taken to the PACU in stable condition.  She tolerated the procedure well and there were no complications.  ESTIMATED BLOOD LOSS: 20 mL  TOURNIQUET TIME: 11 minutes  SPECIMENS: Aerobic and anaerobic cultures x2  POSTOPERATIVE PLAN: The patient will be transferred back to the floor where she will continue inpatient care.  We will follow-up her cultures and have PT evaluate her here for whirlpool therapy.  We will continue to monitor her  wounds as well as cultures for full resolution.  IMPLANTS: Penrose drain x2

## 2019-09-16 NOTE — Anesthesia Procedure Notes (Signed)
Procedure Name: MAC Date/Time: 09/16/2019 6:00 PM Performed by: Barrington Ellison, CRNA Pre-anesthesia Checklist: Patient identified, Emergency Drugs available, Suction available, Patient being monitored and Timeout performed Patient Re-evaluated:Patient Re-evaluated prior to induction Oxygen Delivery Method: Simple face mask

## 2019-09-16 NOTE — Anesthesia Procedure Notes (Signed)
Anesthesia Regional Block: Supraclavicular block   Pre-Anesthetic Checklist: ,, timeout performed, Correct Patient, Correct Site, Correct Laterality, Correct Procedure, Correct Position, site marked, Risks and benefits discussed,  Surgical consent,  Pre-op evaluation,  At surgeon's request and post-op pain management  Laterality: Upper and Left  Prep: chloraprep       Needles:  Injection technique: Single-shot  Needle Type: Echogenic Stimulator Needle     Needle Length: 10cm  Needle Gauge: 21   Needle insertion depth: 1.5 cm   Additional Needles:   Procedures:,,,, ultrasound used (permanent image in chart),,,,  Narrative:  Start time: 09/16/2019 4:45 PM End time: 09/16/2019 4:55 PM Injection made incrementally with aspirations every 5 mL.  Performed by: Personally  Anesthesiologist: Lyn Hollingshead, MD

## 2019-09-16 NOTE — Progress Notes (Signed)
Positive MRSA nasal swab

## 2019-09-16 NOTE — Progress Notes (Signed)
Initial Nutrition Assessment  DOCUMENTATION CODES:   Not applicable  INTERVENTION:    Ensure Enlive po BID, each supplement provides 350 kcal and 20 grams of protein  MVI daily  NUTRITION DIAGNOSIS:   Increased nutrient needs related to wound healing as evidenced by estimated needs.  GOAL:   Patient will meet greater than or equal to 90% of their needs  MONITOR:   Supplement acceptance, Weight trends, Labs, I & O's, PO intake  REASON FOR ASSESSMENT:   Malnutrition Screening Tool    ASSESSMENT:   Patient with PMH significant for bipolar disorder, hepatitis C, and heroin abuse. Presents this admission with L hand cellulitis in setting of IVDU.   Plan OR I&D this afternoon.   Patient on precautions. Spoke via phone. Reports having intermittent loss in appetite depending on her drug use. States she typically has to choose between drugs or food because she cant afford both. On average she eats one meal daily that consist of oatmeal cookie or other snack foods. Discussed the importance of protein intake to promote wound healing. Pt amendable to Ensure.   When asked what pt's UBW stay around pt states "Ma'am I can't answer all of these questions you're asking me." Wt this admission looks to be stated. Will need to obtain recent wt to assess for weight loss.   I/O: +490 ml since admit  Drips: antibiotics  Labs: reviewed   Diet Order:   Diet Order            Diet NPO time specified  Diet effective now              EDUCATION NEEDS:   Not appropriate for education at this time  Skin:  Skin Assessment: Skin Integrity Issues: Skin Integrity Issues:: Other (Comment) Other: L arm cellulitis  Last BM:  10/18  Height:   Ht Readings from Last 1 Encounters:  09/15/19 5\' 4"  (1.626 m)    Weight:   Wt Readings from Last 1 Encounters:  09/15/19 72.6 kg    Ideal Body Weight:  54.5 kg  BMI:  Body mass index is 27.46 kg/m.  Estimated Nutritional Needs:    Kcal:  1700-1900 kcal  Protein:  85-100 grams  Fluid:  >/= 1.7 L/day   Mariana Single RD, LDN Clinical Nutrition Pager # - [phone]

## 2019-09-16 NOTE — Transfer of Care (Signed)
Immediate Anesthesia Transfer of Care Note  Patient: Meghan Bullock  Procedure(s) Performed: IRRIGATION AND DEBRIDEMENT EXTREMITY (Left )  Patient Location: PACU  Anesthesia Type:MAC and Regional  Level of Consciousness: awake, alert  and oriented  Airway & Oxygen Therapy: Patient Spontanous Breathing  Post-op Assessment: Report given to RN  Post vital signs: Reviewed and stable  Last Vitals:  Vitals Value Taken Time  BP    Temp    Pulse 45 09/16/19 1830  Resp 16 09/16/19 1830  SpO2 100 % 09/16/19 1830  Vitals shown include unvalidated device data.  Last Pain:  Vitals:   09/16/19 0835  TempSrc: Oral  PainSc: 9          Complications: No apparent anesthesia complications

## 2019-09-17 ENCOUNTER — Encounter (HOSPITAL_COMMUNITY): Payer: Self-pay | Admitting: Orthopaedic Surgery

## 2019-09-17 LAB — CBC
HCT: 33.9 % — ABNORMAL LOW (ref 36.0–46.0)
Hemoglobin: 10.7 g/dL — ABNORMAL LOW (ref 12.0–15.0)
MCH: 24.8 pg — ABNORMAL LOW (ref 26.0–34.0)
MCHC: 31.6 g/dL (ref 30.0–36.0)
MCV: 78.5 fL — ABNORMAL LOW (ref 80.0–100.0)
Platelets: 344 10*3/uL (ref 150–400)
RBC: 4.32 MIL/uL (ref 3.87–5.11)
RDW: 13.7 % (ref 11.5–15.5)
WBC: 9.9 10*3/uL (ref 4.0–10.5)
nRBC: 0 % (ref 0.0–0.2)

## 2019-09-17 MED ORDER — HYDROMORPHONE HCL 1 MG/ML IJ SOLN
1.0000 mg | INTRAMUSCULAR | Status: DC | PRN
  Administered 2019-09-17: 1 mg via INTRAVENOUS
  Filled 2019-09-17: qty 1

## 2019-09-17 MED ORDER — OXYCODONE-ACETAMINOPHEN 5-325 MG PO TABS
2.0000 | ORAL_TABLET | ORAL | Status: DC | PRN
  Administered 2019-09-17: 11:00:00 2 via ORAL
  Filled 2019-09-17: qty 2

## 2019-09-17 MED ORDER — NICOTINE 21 MG/24HR TD PT24
21.0000 mg | MEDICATED_PATCH | Freq: Every day | TRANSDERMAL | Status: DC
  Administered 2019-09-17: 21 mg via TRANSDERMAL
  Filled 2019-09-17: qty 1

## 2019-09-17 MED ORDER — SENNOSIDES-DOCUSATE SODIUM 8.6-50 MG PO TABS: 1.0000 | ORAL_TABLET | Freq: Every day | ORAL | Status: DC

## 2019-09-17 MED ORDER — SENNOSIDES-DOCUSATE SODIUM 8.6-50 MG PO TABS: 1.0000 | ORAL_TABLET | Freq: Every evening | ORAL | Status: DC | PRN

## 2019-09-17 NOTE — Progress Notes (Signed)
NAME:  Meghan Bullock, MRN:  [id number], DOB:  08-04-1991, LOS: 2 ADMISSION DATE:  09/14/2019, Primary: Patient, No Pcp Per  CHIEF COMPLAINT:  Left hand pain  Medical Service: Internal Medicine Teaching Service         Attending Physician: Dr. Reymundo Poll, MD    First Contact: Dr. Ephriam Knuckles Pager: [phone]  Second Contact: Dr. Cleaster Corin Pager: [phone]       After Hours (After 5p/  First Contact Pager: [phone]  weekends / holidays): Second Contact Pager: [phone]    Brief History  28 yo female with pertinent PMH of IVDU presenting with left hand pain. Leukocytosis present on admission and found to have left hand cellulitis with abscess formation. I&D per hand surgery 10/19.  Subjective  Reporting 9 out of 10 pain this morning.  No other complaints  Objective   Blood pressure 106/72, pulse (!) 57, temperature 98.7 F (37.1 C), temperature source Oral, resp. rate 17, height 5\' 4"  (1.626 m), weight 72.6 kg, SpO2 100 %, not currently breastfeeding.     Intake/Output Summary (Last 24 hours) at 09/17/2019 0628 Last data filed at 09/16/2019 1936 Gross per 24 hour  Intake 240 ml  Output 505 ml  Net -265 ml   Filed Weights   09/15/19 1100  Weight: 72.6 kg    Examination: General: In no acute distress.  Sleeping in bed. Cardiac: Heart regular rate and rhythm Pulmonary: Lungs clear to auscultation Extremities: Intact dressing with Ace wrap to the left hand and wrist.   Consults:  Hand Surgery Consults:  10/19: I&D left hand abscesses  Significant Diagnostic Tests:  10/17 Left hand x-ray: Edema overlying the dorsum of the metacarpals.  No osseous abnormalities  10/18 echo: neg for valvular abnormalities  Micro Data:  10/18 blood cultures>>NGTD 10/19 surgical cultures>>few gram positive cocci  Antimicrobials:  Vancomycin 10/18>> Merrem 10/18>>10/19 Rocephin  10/19>>10/20  Labs    CBC Latest Ref Rng & Units 09/17/2019 09/16/2019 09/15/2019  WBC 4.0 - 10.5  K/uL 9.9 11.0(H) 12.6(H)  Hemoglobin 12.0 - 15.0 g/dL 10.7(L) 11.1(L) 10.0(L)  Hematocrit 36.0 - 46.0 % 33.9(L) 34.4(L) 33.7(L)  Platelets 150 - 400 K/uL 344 311 302    Summary  28 yo female with history of IVDU presented to ED on 10/18 for left hand pain and found to have cellulitis with abscess formation of the left hand and forearm. Surgery consulted and taken to OR for I&D on 10/19.  Assessment & Plan:  Principal Problem:   Cellulitis and abscess of hand  Left hand cellulitis in setting of IV drug use.  White count 12.6 on admission.  Afebrile.  Vital signs stable.  White count 9.9 this morning.  Blood cultures no growth to date.  Echo neg for vegetations. POD #1 of I&D of left hand abscess.  Wound cultures with gram positive cocci Plan Continue vanc.  Discontinue Rocephin Repeat CBC in a.m. We will continue to monitor fever curve We will increase Percocet to 10 mg every 4 hours as needed.  Dilaudid 1 mg every 3 hours as needed to only be used after p.o. medications  Opioid dependence.  Patient admits to injecting approximately 1.5 g of heroin daily.  She notes previously having tried Suboxone and methadone in the past. Plan Can discuss her plan for cessation prior to discharge.  Best practice:  CODE STATUS: DNR Diet:Regular Pain management: percocet. Dilaudid for breakthrough pain. DVT for prophylaxis: lovenox Dispo: pending culture results and medical stabilization  11/19, MD INTERNAL  MEDICINE RESIDENT PGY-1 PAGER #: [phone] 09/17/19 6:28 AM

## 2019-09-17 NOTE — Progress Notes (Signed)
   Ortho Hand Progress Note  Subjective: No acute events last night. Pain in left arm well controlled and block starting to wear off.    Objective: Vital signs in last 24 hours: Temp:  [97.8 F (36.6 C)-98.7 F (37.1 C)] 98.7 F (37.1 C) (10/20 0346) Pulse Rate:  [45-98] 57 (10/20 0346) Resp:  [16-21] 17 (10/20 0346) BP: (103-170)/(51-95) 106/72 (10/20 0346) SpO2:  [99 %-100 %] 100 % (10/20 0346)  Intake/Output from previous day: 10/19 0701 - 10/20 0700 In: 240 [P.O.:240] Out: 505 [Urine:500; Blood:5] Intake/Output this shift: No intake/output data recorded.  Recent Labs    09/15/19 0741 09/16/19 0544 09/17/19 0430  HGB 10.0* 11.1* 10.7*   Recent Labs    09/16/19 0544 09/17/19 0430  WBC 11.0* 9.9  RBC 4.38 4.32  HCT 34.4* 33.9*  PLT 311 344   Recent Labs    09/15/19 0741 09/16/19 0544  NA 138 138  K 3.8 3.6  CL 104 104  CO2 25 23  BUN 8 5*  CREATININE 0.62 0.59  GLUCOSE 129* 101*  CALCIUM 8.9 8.9   No results for input(s): LABPT, INR in the last 72 hours.  Aaox3 nad Resp nonlabored RRR LUE: dressings c/d/i. Exposed digits with minimal swelling. Motor intact ain/pin/u. SILT m/u/r. Figners wwp with bcr.   Assessment/Plan: Left dorsal hand and forearm abscesses s/p I&D. DOS 09/16/2019  - Medicine primary. Appreciate management - Patient refused dressing change this AM. Please change dressing later today with PT and pull penrose drains. - PT/OT. PT for whirlpool therapy to left hand and forearm - Cx: pending. Gram stain GPCs - Abx per primary - encourage LUE elevation and digit ROM - Remainder per primary   Avanell Shackleton III 09/17/2019, 7:11 AM  [phone]

## 2019-09-17 NOTE — Progress Notes (Signed)
Physical Therapy Wound Treatment Patient Details  Name: Meghan Bullock MRN: [id number] Date of Birth: 26-Nov-1991  Today's Date: 09/17/2019 Time: 1018-1051 Time Calculation (min): 33 min  Subjective  Subjective: Reluctantly agreeable to hydrotherapy Patient and Family Stated Goals: Decrease pain Date of Onset: (Unknown) Prior Treatments: I&D 09/16/2019  Pain Score: Pt was premedicated with IV Dilauded but asking for oral pain meds during session and refusing full assessment and treatment due to pain and anxiety.   Wound Assessment  Wound / Incision (Open or Dehisced) 09/17/19 Incision - Open Arm (Active)  Wound Image   09/17/19 1143  Dressing Type Gauze (Comment);Moist to dry;Compression wrap 09/17/19 1143  Dressing Changed Changed 09/17/19 1143  Dressing Status Clean;Dry;Intact 09/17/19 1143  Dressing Change Frequency Daily 09/17/19 1143  Site / Wound Assessment Red 09/17/19 1143  % Wound base Red or Granulating 100% 09/17/19 1143  % Wound base Yellow/Fibrinous Exudate 0% 09/17/19 1143  % Wound base Black/Eschar 0% 09/17/19 1143  % Wound base Other/Granulation Tissue (Comment) 0% 09/17/19 1143  Peri-wound Assessment Intact 09/17/19 1143  Wound Length (cm) 1 cm 09/17/19 1143  Wound Width (cm) 0.2 cm 09/17/19 1143  Wound Depth (cm) 0 cm 09/17/19 1143  Wound Volume (cm^3) 0 cm^3 09/17/19 1143  Wound Surface Area (cm^2) 0.2 cm^2 09/17/19 1143  Margins Unattached edges (unapproximated) 09/17/19 1143  Closure Sutures 09/17/19 1143  Drainage Amount Minimal 09/17/19 1143  Drainage Description Serosanguineous 09/17/19 1143  Treatment Hydrotherapy (Pulse lavage);Packing (Saline gauze) 09/17/19 1143     Wound / Incision (Open or Dehisced) 09/17/19 Incision - Open Hand (Active)  Wound Image   09/17/19 1143  Dressing Type Compression wrap;Gauze (Comment);Moist to dry 09/17/19 1143  Dressing Changed Changed 09/17/19 1143  Dressing Status Clean;Dry;Intact 09/17/19 1143  Dressing Change  Frequency Daily 09/17/19 1143  Site / Wound Assessment Red 09/17/19 1143  % Wound base Red or Granulating 100% 09/17/19 1143  % Wound base Yellow/Fibrinous Exudate 0% 09/17/19 1143  % Wound base Black/Eschar 0% 09/17/19 1143  % Wound base Other/Granulation Tissue (Comment) 0% 09/17/19 1143  Peri-wound Assessment Intact 09/17/19 1143  Wound Length (cm) 1 cm 09/17/19 1143  Wound Width (cm) 0.2 cm 09/17/19 1143  Wound Depth (cm) 0 cm 09/17/19 1143  Wound Volume (cm^3) 0 cm^3 09/17/19 1143  Wound Surface Area (cm^2) 0.2 cm^2 09/17/19 1143  Tunneling (cm) 0 09/17/19 1143  Undermining (cm) 0 09/17/19 1143  Margins Unattached edges (unapproximated) 09/17/19 1143  Closure Sutures 09/17/19 1143  Drainage Amount Minimal 09/17/19 1143  Drainage Description Serosanguineous 09/17/19 1143  Treatment Hydrotherapy (Pulse lavage);Packing (Saline gauze) 09/17/19 1143      Hydrotherapy Pulsed lavage therapy - wound location: L hand and forearm Pulsed Lavage with Suction (psi): 4 psi Pulsed Lavage with Suction - Normal Saline Used: 1000 mL Pulsed Lavage Tip: Tip with splash shield   Wound Assessment and Plan  Wound Therapy - Assess/Plan/Recommendations Wound Therapy - Clinical Statement: Pt presents to hydrotherapy s/p I&D of L forearm and L hand on 09/26/2019. Pt reluctant to let hydrotherapy perform treatment, and did not allow full treatment due to pain/anxiety, however we did manage to at least do some pulsed-lavage and redress the wounds. Pt did not allow full examination of wounds but they appear 100% clean from what I can tell. This patient will benefit from continued hydrotherapy for pulsed-lavage, to decrease bioburden, and promote wound bed healing.  Wound Therapy - Functional Problem List: Decreased AROM, acute pain Factors Delaying/Impairing Wound Healing: Infection - systemic/local;Substance  abuse  Wound Therapy Goals- Improve the function of patient's integumentary system by progressing  the wound(s) through the phases of wound healing (inflammation - proliferation - remodeling) by:    Goals will be updated until maximal potential achieved or discharge criteria met.  Discharge criteria: when goals achieved, discharge from hospital, MD decision/surgical intervention, no progress towards goals, refusal/missing three consecutive treatments without notification or medical reason.  GP     Meghan Bullock 09/17/2019, 1:01 PM   Meghan Bullock, PT, DPT Acute Rehabilitation Services Pager: [phone] Office: [phone]

## 2019-09-17 NOTE — Progress Notes (Signed)
Patient apparently left AMA. She was complaining of uncontrolled pain. Our team was not notified or paged. I received an "FYI" epic message after the patient had already left. We were not able to counsel the patient on risk of leaving prior to her signing out AMA.

## 2019-09-17 NOTE — Progress Notes (Signed)
PT Cancellation Note  Patient Details Name: Meghan Bullock MRN: [id number] DOB: [birth year]   Cancelled Treatment:    Reason Eval/Treat Not Completed: Other (comment) per chart review, PT order is for wound care/hydro and not mobility. Have paged out to PT wound care team requesting them to pick this patient up today if their time and schedule allow.    Deniece Ree PT, DPT, CBIS  Supplemental Physical Therapist [facility]    Pager [phone] Acute Rehab Office [phone]

## 2019-09-17 NOTE — Progress Notes (Signed)
Patient requesting to leave AMA at this time. She states "nothing is working.  The pain medicine doesn't work"  MD being made aware

## 2019-09-17 NOTE — Evaluation (Signed)
Occupational Therapy Evaluation Patient Details Name: Meghan Bullock MRN: [id number] DOB: 1991/11/02 Today's Date: 09/17/2019    History of Present Illness 28 yo female with pertinent PMH of IVDU presenting with left hand pain. Leukocytosis present on admission and found to have left hand cellulitis with abscess formation. I&D per hand surgery 10/19   Clinical Impression   Pt is at baseline independent level of function with ADLs/selfcare. Pt educated on elevated positioning of L UE/forearm; pt verbalized and demonstrates understanding. Pt educated on AROM of L digits, elbow and shoulder. All education completed and no further acute OT is indicated at this time    Follow Up Recommendations  Outpatient OT(per MD as needed)    Equipment Recommendations  None recommended by OT    Recommendations for Other Services       Precautions / Restrictions Precautions Precautions: Other (comment) Precaution Comments: keep L UE elevated Restrictions Weight Bearing Restrictions: No      Mobility Bed Mobility Overal bed mobility: Independent                Transfers Overall transfer level: Independent Equipment used: None                  Balance Overall balance assessment: No apparent balance deficits (not formally assessed)                                         ADL either performed or assessed with clinical judgement   ADL Overall ADL's : At baseline;Independent                                             Vision Patient Visual Report: No change from baseline       Perception     Praxis      Pertinent Vitals/Pain Pain Assessment: 0-10 Pain Score: 8  Pain Descriptors / Indicators: Sore;Shooting;Throbbing Pain Intervention(s): Monitored during session;RN gave pain meds during session;Repositioned     Hand Dominance Right   Extremity/Trunk Assessment Upper Extremity Assessment Upper Extremity Assessment: LUE  deficits/detail LUE: Unable to fully assess due to pain;Unable to fully assess due to immobilization   Lower Extremity Assessment Lower Extremity Assessment: Defer to PT evaluation   Cervical / Trunk Assessment Cervical / Trunk Assessment: Normal   Communication Communication Communication: No difficulties   Cognition Arousal/Alertness: Awake/alert Behavior During Therapy: Flat affect;Agitated Overall Cognitive Status: Within Functional Limits for tasks assessed                                     General Comments       Exercises Other Exercises Other Exercises: pt educated on elevated positioning of L UE/forearm; pt verbalized and demonstrates understanding. Other Exercises: pt educated on AROM of L digits, elbow and shoulder   Shoulder Instructions      Home Living Family/patient expects to be discharged to:: Unsure                                        Prior Functioning/Environment Level of Independence: Independent  OT Problem List: Pain;Increased edema      OT Treatment/Interventions:      OT Goals(Current goals can be found in the care plan section) Acute Rehab OT Goals Patient Stated Goal: go home OT Goal Formulation: With patient  OT Frequency:     Barriers to D/C:            Co-evaluation              AM-PAC OT "6 Clicks" Daily Activity     Outcome Measure Help from another person eating meals?: None Help from another person taking care of personal grooming?: None Help from another person toileting, which includes using toliet, bedpan, or urinal?: None Help from another person bathing (including washing, rinsing, drying)?: None Help from another person to put on and taking off regular upper body clothing?: None Help from another person to put on and taking off regular lower body clothing?: None 6 Click Score: 24   End of Session    Activity Tolerance: Patient tolerated treatment  well Patient left: in bed;with call bell/phone within reach  OT Visit Diagnosis: Pain Pain - Right/Left: Left Pain - part of body: Hand;Arm                Time: 4492-0100 OT Time Calculation (min): 26 min Charges:  OT General Charges $OT Visit: 1 Visit OT Evaluation $OT Eval Low Complexity: 1 Low OT Treatments $Therapeutic Activity: 8-22 mins    Britt Bottom 09/17/2019, 2:08 PM

## 2019-09-17 NOTE — Discharge Summary (Signed)
   Name: Meghan Bullock MRN: [id number] DOB: Sep 05, 1991 28 y.o. PCP: Patient, No Pcp Per  Date of Admission: 09/14/2019  8:12 PM Date of Discharge: 09/17/2019 Attending Physician: No att. providers found  Discharge Diagnosis: 1. Left forearm/hand Abscess  2. IVDU  Discharge Medications: Allergies as of 09/17/2019      Reactions   Acetaminophen Itching      Medication List    You have not been prescribed any medications.     Disposition and follow-up:   Ms.Meghan Bullock left AMA from [facility] in [place] condition.  At the hospital follow up visit please address:  1.  Left Forearm/Hand Abscess: Patient left AMA prior to completing antibiotic therapy. She was s/p POD1 for left forearm/hand abscess secondary to IVDU.    2.  Labs / imaging needed at time of follow-up: CBC, CMP  3.  Pending labs/ test needing follow-up: blood cultlures, wound cultures   Follow-up Appointments: Follow-up Information    [facility]. Schedule an appointment as soon as possible for a visit.   Contact information: [place] [id number] [place] [facility] Course by problem list: 1. Left Forearm Abscess Mrs. Meghan Bullock has a history of IVDU and presented with left forearm abscesss and left hand cellulitis. She was started on vanc/merrem, then narrowed to vancomycin. Orthopedic surgery was consulted and performed an I&D the following day. The following day patient left AMA prior to completing antibiotic therapy. She left before we were able to be notified and subsequently were not able to educate her or discuss the necessary treatment for her infection.   Discharge Vitals:   BP 121/83 (BP Location: Right Arm)   Pulse (!) 59   Temp 98.6 F (37 C) (Oral)   Resp 16   Ht 5\' 4"  (1.626 m)   Wt 72.6 kg   SpO2 99%   BMI 27.46 kg/m   Pertinent Labs, Studies, and Procedures:   Echo IMPRESSIONS    1.  Left ventricular ejection fraction, by visual estimation, is 60 to 65%. The left ventricle has normal function. Normal left ventricular size. There is no left ventricular hypertrophy.  2. Global right ventricle has normal systolic function.The right ventricular size is normal. No increase in right ventricular wall thickness.  3. Left atrial size was normal.  4. Right atrial size was normal.  5. The mitral valve is normal in structure. No evidence of mitral valve regurgitation. No evidence of mitral stenosis.  6. The tricuspid valve is normal in structure. Tricuspid valve regurgitation was not visualized by color flow Doppler.  7. The aortic valve is normal in structure. Aortic valve regurgitation is trivial by color flow Doppler. Structurally normal aortic valve, with no evidence of sclerosis or stenosis.  8. The pulmonic valve was normal in structure. Pulmonic valve regurgitation is trivial by color flow Doppler.  9. The inferior vena cava is normal in size with greater than 50% respiratory variability, suggesting right atrial pressure of 3 mmHg. 10. No vegetations are seen.  DG Hand IMPRESSION: Soft tissue edema overlying the dorsum of the metacarpals. No osseous abnormality. No soft tissue air or radiopaque foreign body.   Electronically Signed   By: Keith Rake M.D.   On: 09/14/2019 21:03  Discharge Instructions: Patient left AMA  Signed: Marty Heck, DO 09/18/2019, 10:48 AM   Pager: [phone]

## 2019-09-19 ENCOUNTER — Encounter (HOSPITAL_COMMUNITY): Payer: Self-pay | Admitting: Orthopaedic Surgery

## 2019-09-19 NOTE — Anesthesia Preprocedure Evaluation (Signed)
Anesthesia Evaluation  Patient identified by MRN, date of birth, ID band Patient awake    Reviewed: Allergy & Precautions, NPO status , Patient's Chart, lab work & pertinent test results  Airway Mallampati: I       Dental  (+) Poor Dentition   Pulmonary Current Smoker and Patient abstained from smoking.,    Pulmonary exam normal breath sounds clear to auscultation       Cardiovascular negative cardio ROS Normal cardiovascular exam Rhythm:Regular Rate:Normal     Neuro/Psych PSYCHIATRIC DISORDERS Anxiety Depression Bipolar Disorder    GI/Hepatic negative GI ROS, (+)     substance abuse  IV drug use,   Endo/Other  negative endocrine ROS  Renal/GU negative Renal ROS     Musculoskeletal   Abdominal Normal abdominal exam  (+)   Peds  Hematology  (+) anemia ,   Anesthesia Other Findings   Reproductive/Obstetrics                             Anesthesia Physical Anesthesia Plan  ASA: II  Anesthesia Plan: MAC and Regional   Post-op Pain Management:    Induction:   PONV Risk Score and Plan: Ondansetron  Airway Management Planned: Nasal Cannula and Natural Airway  Additional Equipment: None  Intra-op Plan:   Post-operative Plan:   Informed Consent: I have reviewed the patients History and Physical, chart, labs and discussed the procedure including the risks, benefits and alternatives for the proposed anesthesia with the patient or authorized representative who has indicated his/her understanding and acceptance.       Plan Discussed with: CRNA  Anesthesia Plan Comments:         Anesthesia Quick Evaluation

## 2019-09-19 NOTE — Anesthesia Postprocedure Evaluation (Signed)
Anesthesia Post Note  Patient: Meghan Bullock  Procedure(s) Performed: IRRIGATION AND DEBRIDEMENT EXTREMITY (Left )     Patient location during evaluation: PACU Anesthesia Type: MAC and Regional Level of consciousness: awake and alert Pain management: pain level controlled Vital Signs Assessment: post-procedure vital signs reviewed and stable Respiratory status: spontaneous breathing, nonlabored ventilation, respiratory function stable and patient connected to nasal cannula oxygen Cardiovascular status: stable and blood pressure returned to baseline Postop Assessment: no apparent nausea or vomiting Anesthetic complications: no    Last Vitals:  Vitals:   09/17/19 0346 09/17/19 1054  BP: 106/72 121/83  Pulse: (!) 57 (!) 59  Resp: 17 16  Temp: 37.1 C 37 C  SpO2: 100% 99%    Last Pain:  Vitals:   09/17/19 1356  TempSrc:   PainSc: Asleep                 Abygayle Deltoro

## 2019-09-20 LAB — CULTURE, BLOOD (ROUTINE X 2)
Culture: NO GROWTH
Culture: NO GROWTH
Special Requests: ADEQUATE
Special Requests: ADEQUATE

## 2019-09-21 LAB — AEROBIC/ANAEROBIC CULTURE W GRAM STAIN (SURGICAL/DEEP WOUND)

## 2019-11-26 ENCOUNTER — Ambulatory Visit (HOSPITAL_COMMUNITY): Payer: Self-pay

## 2019-11-26 ENCOUNTER — Emergency Department (HOSPITAL_COMMUNITY)
Admission: EM | Admit: 2019-11-26 | Discharge: 2019-11-26 | Disposition: A | Discharge status: Home / Self Care | Payer: Self-pay | Attending: Emergency Medicine | Admitting: Emergency Medicine

## 2019-11-26 ENCOUNTER — Other Ambulatory Visit: Payer: Self-pay

## 2019-11-26 ENCOUNTER — Encounter (HOSPITAL_COMMUNITY): Payer: Self-pay

## 2019-11-26 ENCOUNTER — Emergency Department (HOSPITAL_COMMUNITY): Payer: Self-pay

## 2019-11-26 DIAGNOSIS — Z5321 Procedure and treatment not carried out due to patient leaving prior to being seen by health care provider: Secondary | ICD-10-CM | POA: Insufficient documentation

## 2019-11-26 DIAGNOSIS — F1110 Opioid abuse, uncomplicated: Secondary | ICD-10-CM | POA: Insufficient documentation

## 2019-11-26 DIAGNOSIS — L02413 Cutaneous abscess of right upper limb: Secondary | ICD-10-CM | POA: Insufficient documentation

## 2019-11-26 DIAGNOSIS — L02414 Cutaneous abscess of left upper limb: Secondary | ICD-10-CM | POA: Insufficient documentation

## 2019-11-26 NOTE — ED Triage Notes (Signed)
Pt has abscesses on both arms. Pt states she has had them for a long time. Pt has a large one on her right wrist and left forearm.  Pt is very drowsy. Pt is slurring speech.   Last heroine/fentanyl use at 1700 today.

## 2019-11-26 NOTE — ED Notes (Signed)
Patient called x3 with no answer. 

## 2019-11-26 NOTE — ED Notes (Signed)
Per XR tech, patient is refusing X-ray at this time.

## 2019-11-26 NOTE — ED Notes (Signed)
Pt was waiting in consult room for blood when she said she was going to smoke a cigarette. Staff informed pt that she cannot smoke in the consult room. Pt began yelling at staff. Pt ambulated to the waiting room with all belongings.

## 2019-11-26 NOTE — ED Notes (Signed)
Patient called x 2 with no answer 

## 2019-11-26 NOTE — ED Notes (Signed)
Patient reports she does not want to wait for blood draw. Visualized walking out of department. States she wants to smoke a cigarette.

## 2019-11-26 NOTE — ED Notes (Signed)
Patient called for vital recheck with no answer. 

## 2020-01-22 ENCOUNTER — Encounter (HOSPITAL_COMMUNITY): Payer: Self-pay | Admitting: Emergency Medicine

## 2020-01-22 ENCOUNTER — Emergency Department (HOSPITAL_COMMUNITY)
Admission: EM | Admit: 2020-01-22 | Discharge: 2020-01-23 | Disposition: A | Discharge status: Home / Self Care | Payer: Self-pay | Attending: Emergency Medicine | Admitting: Emergency Medicine

## 2020-01-22 ENCOUNTER — Other Ambulatory Visit: Payer: Self-pay

## 2020-01-22 DIAGNOSIS — L0291 Cutaneous abscess, unspecified: Secondary | ICD-10-CM

## 2020-01-22 DIAGNOSIS — L02415 Cutaneous abscess of right lower limb: Secondary | ICD-10-CM | POA: Insufficient documentation

## 2020-01-22 DIAGNOSIS — L02213 Cutaneous abscess of chest wall: Secondary | ICD-10-CM | POA: Insufficient documentation

## 2020-01-22 DIAGNOSIS — E876 Hypokalemia: Secondary | ICD-10-CM | POA: Insufficient documentation

## 2020-01-22 DIAGNOSIS — F1110 Opioid abuse, uncomplicated: Secondary | ICD-10-CM | POA: Insufficient documentation

## 2020-01-22 LAB — COMPREHENSIVE METABOLIC PANEL
ALT: 10 U/L (ref 0–44)
AST: 14 U/L — ABNORMAL LOW (ref 15–41)
Albumin: 3.5 g/dL (ref 3.5–5.0)
Alkaline Phosphatase: 98 U/L (ref 38–126)
Anion gap: 9 (ref 5–15)
BUN: 5 mg/dL — ABNORMAL LOW (ref 6–20)
CO2: 28 mmol/L (ref 22–32)
Calcium: 8.7 mg/dL — ABNORMAL LOW (ref 8.9–10.3)
Chloride: 98 mmol/L (ref 98–111)
Creatinine, Ser: 0.67 mg/dL (ref 0.44–1.00)
GFR calc Af Amer: >60 mL/min (ref >60)
GFR calc non Af Amer: >60 mL/min (ref >60)
Glucose, Bld: 107 mg/dL — ABNORMAL HIGH (ref 70–99)
Potassium: 2.9 mmol/L — ABNORMAL LOW (ref 3.5–5.1)
Sodium: 135 mmol/L (ref 135–145)
Total Bilirubin: 0.4 mg/dL (ref 0.3–1.2)
Total Protein: 7.6 g/dL (ref 6.5–8.1)

## 2020-01-22 LAB — CBC WITH DIFFERENTIAL/PLATELET
Abs Immature Granulocytes: 0.05 10*3/uL (ref 0.00–0.07)
Basophils Absolute: 0 10*3/uL (ref 0.0–0.1)
Basophils Relative: 0 %
Eosinophils Absolute: 0.2 10*3/uL (ref 0.0–0.5)
Eosinophils Relative: 2 %
HCT: 36.8 % (ref 36.0–46.0)
Hemoglobin: 11 g/dL — ABNORMAL LOW (ref 12.0–15.0)
Immature Granulocytes: 1 %
Lymphocytes Relative: 31 %
Lymphs Abs: 3.1 10*3/uL (ref 0.7–4.0)
MCH: 22.5 pg — ABNORMAL LOW (ref 26.0–34.0)
MCHC: 29.9 g/dL — ABNORMAL LOW (ref 30.0–36.0)
MCV: 75.4 fL — ABNORMAL LOW (ref 80.0–100.0)
Monocytes Absolute: 0.8 10*3/uL (ref 0.1–1.0)
Monocytes Relative: 8 %
Neutro Abs: 6 10*3/uL (ref 1.7–7.7)
Neutrophils Relative %: 58 %
Platelets: 398 10*3/uL (ref 150–400)
RBC: 4.88 MIL/uL (ref 3.87–5.11)
RDW: 14.7 % (ref 11.5–15.5)
WBC: 10.2 10*3/uL (ref 4.0–10.5)
nRBC: 0 % (ref 0.0–0.2)

## 2020-01-22 LAB — LACTIC ACID, PLASMA: Lactic Acid, Venous: 1.2 mmol/L (ref 0.5–1.9)

## 2020-01-22 MED ORDER — LIDOCAINE HCL (PF) 1 % IJ SOLN
5.0000 mL | Freq: Once | INTRAMUSCULAR | Status: AC
  Administered 2020-01-22: 23:00:00 5 mL via INTRADERMAL
  Filled 2020-01-22: qty 5

## 2020-01-22 MED ORDER — POTASSIUM CHLORIDE CRYS ER 20 MEQ PO TBCR
40.0000 meq | EXTENDED_RELEASE_TABLET | Freq: Once | ORAL | Status: AC
  Administered 2020-01-22: 23:00:00 40 meq via ORAL
  Filled 2020-01-22: qty 2

## 2020-01-22 MED ORDER — DOXYCYCLINE HYCLATE 100 MG PO TABS
100.0000 mg | ORAL_TABLET | Freq: Once | ORAL | Status: AC
  Administered 2020-01-22: 23:00:00 100 mg via ORAL
  Filled 2020-01-22: qty 1

## 2020-01-22 MED ORDER — SODIUM CHLORIDE 0.9% FLUSH: 3.0000 mL | Freq: Once | INTRAVENOUS | Status: DC

## 2020-01-22 MED ORDER — DOXYCYCLINE HYCLATE 100 MG PO CAPS: 100.0000 mg | ORAL_CAPSULE | Freq: Two times a day (BID) | ORAL | 0 refills | Status: DC

## 2020-01-22 NOTE — ED Provider Notes (Signed)
[facility] EMERGENCY DEPARTMENT Provider Note   CSN: [id number] Arrival date & time: 01/22/20  1717     History Chief Complaint  Patient presents with   Abscess   Addiction Problem    Meghan Bullock is a 29 y.o. female.  The history is provided by the patient and medical records.   29 y.o. F with hx of anemia, anxiety, bipolar disorder, hepatitis, polysubstance abuse, presenting to the ED with multiple areas concerning for abscess.  Patient is unable to tell me exactly when these started.  States she has a history of multiple abscess in the past along with history of MRSA.  She continues to use heroin multiple times a day including just prior to arrival.  She denies fever.   Admits she would like some help with her drug use.  Denies SI/HI/AVH.  Past Medical History:  Diagnosis Date   Anemia    Anxiety    Bipolar 1 disorder (HCC)    Chlamydia    Hepatitis    Heroin abuse (HCC)    Mononucleosis    Ovarian cyst    Polysubstance abuse [facility])     Patient Active Problem List   Diagnosis Date Noted   Cellulitis and abscess of hand    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HCC)    Normal labor 06/17/2018   Postpartum care following vaginal delivery 06/17/2018   Encounter for induction of labor 06/15/2018   Iron deficiency anemia 04/30/2018   Supervision of high risk pregnancy, antepartum 10/25/2017   Substance abuse affecting pregnancy, antepartum 10/25/2017   H/O preterm delivery, currently pregnant, first trimester 10/25/2017   Hepatitis C 10/23/2017   Preterm premature rupture of membranes (PPROM) with unknown onset of labor 05/12/2014   Encephalopathy, toxic 10/03/2013   Benzodiazepine abuse (HCC) 10/03/2013   Heroin abuse (HCC)     Past Surgical History:  Procedure Laterality Date   HAND SURGERY     I & D EXTREMITY Left 09/16/2019   Procedure: IRRIGATION AND DEBRIDEMENT EXTREMITY;  Surgeon: Ernest Mallick, MD;  Location[facility];   Service: Orthopedics;  Laterality: Left;   NO PAST SURGERIES       OB History    Gravida  4   Para  2   Term  1   Preterm  1   AB  2   Living  2     SAB  2   TAB      Ectopic      Multiple  0   Live Births  2           History reviewed. No pertinent family history.  Social History   Tobacco Use   Smoking status: Current Every Day Smoker    Packs/day: 1.00    Years: 5.00    Pack years: 5.00    Types: Cigarettes   Smokeless tobacco: Never Used  Substance Use Topics   Alcohol use: No   Drug use: Yes    Types: IV, Heroin    Comment:  heroin      Home Medications Prior to Admission medications   Not on File    Allergies    Acetaminophen  Review of Systems   Review of Systems  Skin:       abscess  All other systems reviewed and are negative.   Physical Exam Updated Vital Signs BP 116/82 (BP Location: Right Arm)    Pulse 77    Temp 98.2 F (36.8 C) (Oral)  Resp 14    SpO2 98%   Physical Exam Vitals and nursing note reviewed.  Constitutional:      Appearance: She is well-developed.     Comments: Drowsy, appears under the influence  HENT:     Head: Normocephalic and atraumatic.  Eyes:     Conjunctiva/sclera: Conjunctivae normal.     Pupils: Pupils are equal, round, and reactive to light.  Cardiovascular:     Rate and Rhythm: Normal rate and regular rhythm.     Heart sounds: Normal heart sounds.  Pulmonary:     Effort: Pulmonary effort is normal.     Breath sounds: Normal breath sounds.  Chest:    Abdominal:     General: Bowel sounds are normal.     Palpations: Abdomen is soft.  Musculoskeletal:        General: Normal range of motion.     Cervical back: Normal range of motion.       Legs:     Comments: Multiple chronic appearing wounds and scars to bilateral hands and feet, no acute abscesses present currently, track marks noted up both arms and in the feet including between toes  Skin:    General: Skin is warm and dry.    Neurological:     Mental Status: She is alert and oriented to person, place, and time.     ED Results / Procedures / Treatments   Labs (all labs ordered are listed, but only abnormal results are displayed) Labs Reviewed  COMPREHENSIVE METABOLIC PANEL - Abnormal; Notable for the following components:      Result Value   Potassium 2.9 (*)    Glucose, Bld 107 (*)    BUN 5 (*)    Calcium 8.7 (*)    AST 14 (*)    All other components within normal limits  CBC WITH DIFFERENTIAL/PLATELET - Abnormal; Notable for the following components:   Hemoglobin 11.0 (*)    MCV 75.4 (*)    MCH 22.5 (*)    MCHC 29.9 (*)    All other components within normal limits  AEROBIC CULTURE (SUPERFICIAL SPECIMEN)  CULTURE, BLOOD (ROUTINE X 2)  CULTURE, BLOOD (ROUTINE X 2)  LACTIC ACID, PLASMA    EKG None  Radiology No results found.  Procedures Procedures (including critical care time)  Medications Ordered in ED Medications  sodium chloride flush (NS) 0.9 % injection 3 mL (3 mLs Intravenous Not Given 01/22/20 2312)  lidocaine (PF) (XYLOCAINE) 1 % injection 5 mL (5 mLs Intradermal Given 01/22/20 2312)  potassium chloride SA (KLOR-CON) CR tablet 40 mEq (40 mEq Oral Given 01/22/20 2310)  doxycycline (VIBRA-TABS) tablet 100 mg (100 mg Oral Given 01/22/20 2310)    ED Course  I have reviewed the triage vital signs and the nursing notes.  Pertinent labs & imaging results that were available during my care of the patient were reviewed by me and considered in my medical decision making (see chart for details).    MDM Rules/Calculators/A&P  29 y.o. F here with multiple areas of abscess formation.  She is a chronic daily heroin abuser, does report using just prior to arrival.  She is afebrile and nontoxic but does appear to be under the influence currently.  She has multiple chronic wounds to bilateral hands, wrists, feet, and ankles.  Some areas of ulceration noted but no apparent cellulitis of these  areas.  She does have a very small abscess to the left breast, this is less than half of centimeter in  diameter and has no fluctuance or surrounding erythema.  Larger abscess to left medial knee that does have central fluctuance and surrounding erythema with warmth to touch.  This area seems amenable to I&D.  She is freely moving the left knee without elicited pain, not concerning for septic joint.  Screening labs were obtained from triage and are overall reassuring with normal lactate and white blood cell count.  Potassium was low here so PO supplementation given. Patient does have history of MRSA so wound and blood cultures will be obtained.  11:40 PM Abscess was anesthetized, however then patient refused to continue with procedure.  There was some drainage from the needle insertion site and I attempted to collect a wound culture, however patient repeatedly smacking my hand out of the way then began yelling.  At this point I left the room.  She will be discharged home with antibiotics pending blood cultures.  She was given resource for detox centers if she would like assistance with her drug use.  She may return here for any new/acute changes.  Prior to discharge, RN was able to get a small amount of fluid for wound culture that drained from site of lidocaine injection from left leg.  Patient still declining to complete I&D or to let us manipulate the abscess to allow further drainage.  She was in the bathroom for a prolonged period of time while awaiting discharge, unclear if she was using further heroin.  Final Clinical Impression(s) / ED Diagnoses Final diagnoses:  Abscess  Heroin abuse ([place])  Hypokalemia    Rx / DC Orders ED Discharge Orders    None       Larene Pickett, PA-C 01/23/20 0042    Maudie Flakes, MD 01/28/20 [phone]

## 2020-01-22 NOTE — ED Triage Notes (Signed)
Pt arrives to ED as she is homeless with complaints of abscesses on both legs, both arms, stomach and back. Patient states she uses heroin everyday and used right before she entered the ED.

## 2020-01-22 NOTE — ED Notes (Signed)
Suture cart and wound culture swabs at bedside.

## 2020-01-22 NOTE — Discharge Instructions (Addendum)
Take the doxycycline as directed. Follow-up with one of the outpatient resource centers if you want help with your heroin abuse.

## 2020-01-23 ENCOUNTER — Telehealth (HOSPITAL_BASED_OUTPATIENT_CLINIC_OR_DEPARTMENT_OTHER): Payer: Self-pay | Admitting: Emergency Medicine

## 2020-01-23 ENCOUNTER — Emergency Department (HOSPITAL_COMMUNITY)
Admission: EM | Admit: 2020-01-23 | Discharge: 2020-01-23 | Discharge status: Against Medical Advice | Payer: Self-pay | Attending: Emergency Medicine | Admitting: Emergency Medicine

## 2020-01-23 DIAGNOSIS — Z5320 Procedure and treatment not carried out because of patient's decision for unspecified reasons: Secondary | ICD-10-CM | POA: Insufficient documentation

## 2020-01-23 DIAGNOSIS — L02416 Cutaneous abscess of left lower limb: Secondary | ICD-10-CM | POA: Insufficient documentation

## 2020-01-23 NOTE — ED Provider Notes (Signed)
[place] EMERGENCY DEPARTMENT Provider Note   CSN: [id number] Arrival date & time: 01/23/20  1239     History No chief complaint on file.   Meghan Bullock is a 29 y.o. female.  29 year old female presents with complaint of a spider bite on her left medial knee area.  Patient was seen for same last night, refused I&D after area was anesthetized.  Patient returns today, states that she does not want the area drained she just wants to take antibiotics states that she does not like needles and does not tolerate I&D well.  Patient refuses to allow me to fully visualize the area, refuses to change into a gown, states that she is going to go outside and meet with her mom and smoke a cigarette and will decide if she wants to return for care later.        Past Medical History:  Diagnosis Date  . Anemia   . Anxiety   . Bipolar 1 disorder ([place])   . Chlamydia   . Hepatitis   . Heroin abuse ([place])   . Mononucleosis   . Ovarian cyst   . Polysubstance abuse [facility])     Patient Active Problem List   Diagnosis Date Noted  . Cellulitis and abscess of hand   . 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[place])   . Normal labor 06/17/2018  . Postpartum care following vaginal delivery 06/17/2018  . Encounter for induction of labor 06/15/2018  . Iron deficiency anemia 04/30/2018  . Supervision of high risk pregnancy, antepartum 10/25/2017  . Substance abuse affecting pregnancy, antepartum 10/25/2017  . H/O preterm delivery, currently pregnant, first trimester 10/25/2017  . Hepatitis C 10/23/2017  . Preterm premature rupture of membranes (PPROM) with unknown onset of labor 05/12/2014  . Encephalopathy, toxic 10/03/2013  . Benzodiazepine abuse ([place]) 10/03/2013  . Heroin abuse [facility])     Past Surgical History:  Procedure Laterality Date  . HAND SURGERY    . I & D EXTREMITY Left 09/16/2019   Procedure: IRRIGATION AND DEBRIDEMENT EXTREMITY;  Surgeon: Verner Mould, MD;  Location: [place];   Service: Orthopedics;  Laterality: Left;  . NO PAST SURGERIES       OB History    Gravida  4   Para  2   Term  1   Preterm  1   AB  2   Living  2     SAB  2   TAB      Ectopic      Multiple  0   Live Births  2           No family history on file.  Social History   Tobacco Use  . Smoking status: Current Every Day Smoker    Packs/day: 1.00    Years: 5.00    Pack years: 5.00    Types: Cigarettes  . Smokeless tobacco: Never Used  Substance Use Topics  . Alcohol use: No  . Drug use: Yes    Types: IV, Heroin    Comment:  heroin      Home Medications Prior to Admission medications   Medication Sig Start Date End Date Taking? Authorizing Provider  doxycycline (VIBRAMYCIN) 100 MG capsule Take 1 capsule (100 mg total) by mouth 2 (two) times daily. 01/22/20   Larene Pickett, PA-C    Allergies    Acetaminophen  Review of Systems   Review of Systems  Constitutional: Negative for fever.  Musculoskeletal: Positive for joint  swelling and myalgias.  Skin: Positive for color change.    Physical Exam Updated Vital Signs BP 132/77 (BP Location: Right Arm)   Pulse (!) 109   Temp 99.9 F (37.7 C) (Oral)   Resp 19   SpO2 100%   Physical Exam Vitals and nursing note reviewed.  Cardiovascular:     Pulses: Normal pulses.  Musculoskeletal:        General: Tenderness present.  Skin:    Findings: Erythema present.       Neurological:     Mental Status: She is alert and oriented to person, place, and time.     ED Results / Procedures / Treatments   Labs (all labs ordered are listed, but only abnormal results are displayed) Labs Reviewed - No data to display  EKG None  Radiology No results found.  Procedures Procedures (including critical care time)  Medications Ordered in ED Medications - No data to display  ED Course  I have reviewed the triage vital signs and the nursing notes.  Pertinent labs & imaging results that were available  during my care of the patient were reviewed by me and considered in my medical decision making (see chart for details).  Clinical Course as of Jan 22 1841  Thu Jan 23, 2020  755 29 year old female with past medical history of IV drug use presents with abscess to left lower thigh.  Review of records, patient was seen in the ER last night for same, given prescription for antibiotics, attempted I&D however patient would not allow for further treatment after area was anesthetized.  Review of labs with growth of gram-positive cocci in chains on wound culture.  Blood cultures pending.  Patient would not change into hospital gown for exam, would not allow me to fully expose the area, does appear to have an abscess to her left distal thigh medially, concern for lymphangitis.  Discussed with patient need to change into a gown for proper exam, frequently smacks my hands as I attempt to examine her even when asked to stop, likely admission to the hospital for IV antibiotics and drainage of her abscess.  Concern for oral temp of 99 9 with pulse rate of 109.  Patient is agreeable with plan for admission and IV antibiotics, states that she needs to go outside to meet with her mom for something and smoke a cigarette and she will come back.  Patient never returned to the department, believed to have eloped.   [LM]    Clinical Course User Index [LM] Alden Hipp   MDM Rules/Calculators/A&P                      Final Clinical Impression(s) / ED Diagnoses Final diagnoses:  Abscess of left leg    Rx / DC Orders ED Discharge Orders    None       Jeannie Fend, PA-C 01/23/20 1842    Tegeler, Canary Brim, MD 01/25/20 [phone]

## 2020-01-23 NOTE — ED Triage Notes (Signed)
Pt here states that they thought it was an abscess ob inner left knee but its a spider bite and she left really late last night and the pain has just gotten worse and worse. Pt very sleepy. Pt was in the bathroom for before letting the tech bring her to room.

## 2020-01-23 NOTE — ED Notes (Signed)
Pt left AMA stated that she had to go.

## 2020-01-25 LAB — CULTURE, BLOOD (ROUTINE X 2): Special Requests: ADEQUATE

## 2020-01-25 LAB — AEROBIC CULTURE W GRAM STAIN (SUPERFICIAL SPECIMEN)

## 2020-01-26 ENCOUNTER — Telehealth: Payer: Self-pay | Admitting: Emergency Medicine

## 2020-01-26 NOTE — Telephone Encounter (Signed)
Post ED Visit - Positive Culture Follow-up: Unsuccessful Patient Follow-up  Culture assessed and recommendations reviewed by:  []  , Pharm.D. []  Enzo Bi, Pharm.D., BCPS AQ-ID []  , Pharm.D., BCPS []  Celedonio Miyamoto, .D., BCPS []  [place], .D., BCPS, AAHIVP []  Georgina Pillion, Pharm.D., BCPS, AAHIVP [x]  [address], PharmD []  , PharmD, BCPS  Positive abscess aerobic culture  []  Patient discharged without antimicrobial prescription and treatment is now indicated [x]  Organism is resistant to prescribed ED discharge antimicrobial []  Patient with positive blood cultures   Unable to contact patient @ phone number on file, letter will be sent to address on file  [place] 01/26/2020, 2:22 PM

## 2020-01-28 LAB — CULTURE, BLOOD (ROUTINE X 2)
Culture: NO GROWTH
Special Requests: ADEQUATE

## 2020-03-11 ENCOUNTER — Inpatient Hospital Stay (HOSPITAL_COMMUNITY)
Admission: EM | Admit: 2020-03-11 | Discharge: 2020-03-13 | DRG: 603 | Discharge status: Against Medical Advice | Payer: Self-pay | Attending: Internal Medicine | Admitting: Internal Medicine

## 2020-03-11 ENCOUNTER — Other Ambulatory Visit: Payer: Self-pay

## 2020-03-11 ENCOUNTER — Inpatient Hospital Stay (HOSPITAL_COMMUNITY): Payer: Self-pay

## 2020-03-11 ENCOUNTER — Encounter (HOSPITAL_COMMUNITY): Payer: Self-pay

## 2020-03-11 DIAGNOSIS — Z66 Do not resuscitate: Secondary | ICD-10-CM | POA: Diagnosis present

## 2020-03-11 DIAGNOSIS — Z886 Allergy status to analgesic agent status: Secondary | ICD-10-CM

## 2020-03-11 DIAGNOSIS — Z20822 Contact with and (suspected) exposure to covid-19: Secondary | ICD-10-CM | POA: Diagnosis present

## 2020-03-11 DIAGNOSIS — L0291 Cutaneous abscess, unspecified: Secondary | ICD-10-CM

## 2020-03-11 DIAGNOSIS — L02419 Cutaneous abscess of limb, unspecified: Secondary | ICD-10-CM | POA: Diagnosis present

## 2020-03-11 DIAGNOSIS — L03114 Cellulitis of left upper limb: Secondary | ICD-10-CM | POA: Diagnosis present

## 2020-03-11 DIAGNOSIS — F1910 Other psychoactive substance abuse, uncomplicated: Secondary | ICD-10-CM | POA: Diagnosis present

## 2020-03-11 DIAGNOSIS — F1120 Opioid dependence, uncomplicated: Secondary | ICD-10-CM | POA: Diagnosis present

## 2020-03-11 DIAGNOSIS — L03113 Cellulitis of right upper limb: Principal | ICD-10-CM | POA: Diagnosis present

## 2020-03-11 DIAGNOSIS — F419 Anxiety disorder, unspecified: Secondary | ICD-10-CM | POA: Diagnosis present

## 2020-03-11 DIAGNOSIS — X58XXXA Exposure to other specified factors, initial encounter: Secondary | ICD-10-CM | POA: Diagnosis present

## 2020-03-11 DIAGNOSIS — Z9119 Patient's noncompliance with other medical treatment and regimen: Secondary | ICD-10-CM

## 2020-03-11 DIAGNOSIS — F17210 Nicotine dependence, cigarettes, uncomplicated: Secondary | ICD-10-CM | POA: Diagnosis present

## 2020-03-11 DIAGNOSIS — Z8614 Personal history of Methicillin resistant Staphylococcus aureus infection: Secondary | ICD-10-CM

## 2020-03-11 DIAGNOSIS — Z22322 Carrier or suspected carrier of Methicillin resistant Staphylococcus aureus: Secondary | ICD-10-CM

## 2020-03-11 DIAGNOSIS — S51802A Unspecified open wound of left forearm, initial encounter: Secondary | ICD-10-CM | POA: Diagnosis present

## 2020-03-11 DIAGNOSIS — F1990 Other psychoactive substance use, unspecified, uncomplicated: Secondary | ICD-10-CM

## 2020-03-11 DIAGNOSIS — F319 Bipolar disorder, unspecified: Secondary | ICD-10-CM | POA: Diagnosis present

## 2020-03-11 LAB — CBC WITH DIFFERENTIAL/PLATELET
Abs Immature Granulocytes: 0.09 10*3/uL — ABNORMAL HIGH (ref 0.00–0.07)
Basophils Absolute: 0.1 10*3/uL (ref 0.0–0.1)
Basophils Relative: 0 %
Eosinophils Absolute: 0.1 10*3/uL (ref 0.0–0.5)
Eosinophils Relative: 1 %
HCT: 36.6 % (ref 36.0–46.0)
Hemoglobin: 10.5 g/dL — ABNORMAL LOW (ref 12.0–15.0)
Immature Granulocytes: 1 %
Lymphocytes Relative: 16 %
Lymphs Abs: 2.1 10*3/uL (ref 0.7–4.0)
MCH: 21.2 pg — ABNORMAL LOW (ref 26.0–34.0)
MCHC: 28.7 g/dL — ABNORMAL LOW (ref 30.0–36.0)
MCV: 73.9 fL — ABNORMAL LOW (ref 80.0–100.0)
Monocytes Absolute: 0.8 10*3/uL (ref 0.1–1.0)
Monocytes Relative: 6 %
Neutro Abs: 9.9 10*3/uL — ABNORMAL HIGH (ref 1.7–7.7)
Neutrophils Relative %: 76 %
Platelets: 428 10*3/uL — ABNORMAL HIGH (ref 150–400)
RBC: 4.95 MIL/uL (ref 3.87–5.11)
RDW: 16.6 % — ABNORMAL HIGH (ref 11.5–15.5)
WBC: 13.1 10*3/uL — ABNORMAL HIGH (ref 4.0–10.5)
nRBC: 0 % (ref 0.0–0.2)

## 2020-03-11 LAB — COMPREHENSIVE METABOLIC PANEL
ALT: 9 U/L (ref 0–44)
AST: 13 U/L — ABNORMAL LOW (ref 15–41)
Albumin: 3.1 g/dL — ABNORMAL LOW (ref 3.5–5.0)
Alkaline Phosphatase: 93 U/L (ref 38–126)
Anion gap: 14 (ref 5–15)
BUN: 6 mg/dL (ref 6–20)
CO2: 26 mmol/L (ref 22–32)
Calcium: 9.4 mg/dL (ref 8.9–10.3)
Chloride: 99 mmol/L (ref 98–111)
Creatinine, Ser: 0.69 mg/dL (ref 0.44–1.00)
GFR calc Af Amer: >60 mL/min (ref >60)
GFR calc non Af Amer: >60 mL/min (ref >60)
Glucose, Bld: 90 mg/dL (ref 70–99)
Potassium: 3.5 mmol/L (ref 3.5–5.1)
Sodium: 139 mmol/L (ref 135–145)
Total Bilirubin: 0.2 mg/dL — ABNORMAL LOW (ref 0.3–1.2)
Total Protein: 7.7 g/dL (ref 6.5–8.1)

## 2020-03-11 LAB — I-STAT BETA HCG BLOOD, ED (MC, WL, AP ONLY): I-stat hCG, quantitative: <5 m[IU]/mL (ref <5)

## 2020-03-11 LAB — LACTIC ACID, PLASMA: Lactic Acid, Venous: 1.3 mmol/L (ref 0.5–1.9)

## 2020-03-11 MED ORDER — SODIUM CHLORIDE 0.9 % IV SOLN
2.0000 g | INTRAVENOUS | Status: DC
  Administered 2020-03-11: 2 g via INTRAVENOUS
  Filled 2020-03-11: qty 20

## 2020-03-11 MED ORDER — ONDANSETRON HCL 4 MG/2ML IJ SOLN: 4.0000 mg | Freq: Four times a day (QID) | INTRAMUSCULAR | Status: DC | PRN

## 2020-03-11 MED ORDER — IOHEXOL 300 MG/ML  SOLN
100.0000 mL | Freq: Once | INTRAMUSCULAR | Status: AC | PRN
  Administered 2020-03-11: 100 mL via INTRAVENOUS

## 2020-03-11 MED ORDER — ONDANSETRON HCL 4 MG PO TABS: 4.0000 mg | ORAL_TABLET | Freq: Four times a day (QID) | ORAL | Status: DC | PRN

## 2020-03-11 MED ORDER — BUPRENORPHINE HCL-NALOXONE HCL 2-0.5 MG SL SUBL: 1.0000 | SUBLINGUAL_TABLET | SUBLINGUAL | Status: AC | PRN

## 2020-03-11 MED ORDER — MORPHINE SULFATE (PF) 2 MG/ML IV SOLN
4.0000 mg | Freq: Once | INTRAVENOUS | Status: AC
  Administered 2020-03-11: 4 mg via INTRAVENOUS
  Filled 2020-03-11: qty 2

## 2020-03-11 MED ORDER — BUPRENORPHINE HCL-NALOXONE HCL 8-2 MG SL SUBL
1.0000 | SUBLINGUAL_TABLET | Freq: Two times a day (BID) | SUBLINGUAL | Status: DC
  Administered 2020-03-12 (×2): 1 via SUBLINGUAL
  Filled 2020-03-11 (×4): qty 1

## 2020-03-11 MED ORDER — NICOTINE 21 MG/24HR TD PT24
21.0000 mg | MEDICATED_PATCH | Freq: Every day | TRANSDERMAL | Status: DC
  Administered 2020-03-11 – 2020-03-13 (×3): 21 mg via TRANSDERMAL
  Filled 2020-03-11 (×3): qty 1

## 2020-03-11 MED ORDER — VANCOMYCIN HCL 1.25 G IV SOLR
1250.0000 mg | Freq: Once | INTRAVENOUS | Status: DC
  Filled 2020-03-11: qty 1250

## 2020-03-11 MED ORDER — VANCOMYCIN HCL 1250 MG/250ML IV SOLN
1250.0000 mg | Freq: Once | INTRAVENOUS | Status: AC
  Administered 2020-03-11: 1250 mg via INTRAVENOUS
  Filled 2020-03-11: qty 250

## 2020-03-11 MED ORDER — VANCOMYCIN HCL IN DEXTROSE 750-5 MG/150ML-% IV SOLN
750.0000 mg | Freq: Two times a day (BID) | INTRAVENOUS | Status: DC
  Filled 2020-03-11: qty 150

## 2020-03-11 NOTE — ED Triage Notes (Signed)
Pt reports abscesses to both forearms, infection noted. Pt has hx of the same.

## 2020-03-11 NOTE — ED Notes (Signed)
Pt transported to CT via stretcher.  

## 2020-03-11 NOTE — ED Notes (Signed)
Vanc paused while pt is in CT.

## 2020-03-11 NOTE — ED Provider Notes (Addendum)
[facility] EMERGENCY DEPARTMENT Provider Note   CSN: [id number] Arrival date & time: 03/11/20  1529     History Chief Complaint  Patient presents with  . Abscess    Meghan Bullock is a 29 y.o. female.  HPI Patient presents to the emergency department with abscess to the right wrist most likely.  The patient has been picking at the area she states and states that she has an area on her left forearm that is open with surrounding redness and pain.  The patient states certain movements of palpation make the pain worse.  Patient states she did not take any medications prior to arrival for her symptoms.  Patient is an IV drug user and she states that this is happened to her many times in the past.  The patient denies chest pain, shortness of breath, headache,blurred vision, neck pain, fever, cough, weakness, numbness, dizziness, anorexia, edema, abdominal pain, nausea, vomiting, diarrhea, rash, back pain, dysuria, hematemesis, bloody stool, near syncope, or syncope.    Past Medical History:  Diagnosis Date  . Anemia   . Anxiety   . Bipolar 1 disorder (HCC)   . Chlamydia   . Hepatitis   . Heroin abuse (HCC)   . Mononucleosis   . Ovarian cyst   . Polysubstance abuse [facility])     Patient Active Problem List   Diagnosis Date Noted  . Abscess of wrist 03/11/2020  . Substance use disorder 03/11/2020  . Cellulitis and abscess of hand   . 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HCC)   . Normal labor 06/17/2018  . Postpartum care following vaginal delivery 06/17/2018  . Encounter for induction of labor 06/15/2018  . Iron deficiency anemia 04/30/2018  . Supervision of high risk pregnancy, antepartum 10/25/2017  . Substance abuse affecting pregnancy, antepartum 10/25/2017  . H/O preterm delivery, currently pregnant, first trimester 10/25/2017  . Hepatitis C 10/23/2017  . Preterm premature rupture of membranes (PPROM) with unknown onset of labor 05/12/2014  . Encephalopathy, toxic 10/03/2013  .  Benzodiazepine abuse (HCC) 10/03/2013  . Heroin abuse [facility])     Past Surgical History:  Procedure Laterality Date  . HAND SURGERY    . I & D EXTREMITY Left 09/16/2019   Procedure: IRRIGATION AND DEBRIDEMENT EXTREMITY;  Surgeon: Ernest Mallick, MD;  Location: [facility] OR;  Service: Orthopedics;  Laterality: Left;  . NO PAST SURGERIES       OB History    Gravida  4   Para  2   Term  1   Preterm  1   AB  2   Living  2     SAB  2   TAB      Ectopic      Multiple  0   Live Births  2           No family history on file.  Social History   Tobacco Use  . Smoking status: Current Every Day Smoker    Packs/day: 1.00    Years: 5.00    Pack years: 5.00    Types: Cigarettes  . Smokeless tobacco: Never Used  Substance Use Topics  . Alcohol use: No  . Drug use: Yes    Types: IV, Heroin    Comment:  heroin      Home Medications Prior to Admission medications   Not on File    Allergies    Acetaminophen  Review of Systems   Review of Systems All other systems negative except  as documented in the HPI. All pertinent positives and negatives as reviewed in the HPI. Physical Exam Updated Vital Signs BP 118/81   Pulse 86   Temp 98.5 F (36.9 C) (Oral)   Resp 16   Ht 5\' 4"  (1.626 m)   Wt 61.2 kg   SpO2 99%   BMI 23.17 kg/m   Physical Exam Vitals and nursing note reviewed.  Constitutional:      General: She is not in acute distress.    Appearance: She is well-developed.  HENT:     Head: Normocephalic and atraumatic.  Eyes:     Pupils: Pupils are equal, round, and reactive to light.  Cardiovascular:     Rate and Rhythm: Normal rate and regular rhythm.     Heart sounds: Normal heart sounds. No murmur. No friction rub. No gallop.   Pulmonary:     Effort: Pulmonary effort is normal. No respiratory distress.     Breath sounds: Normal breath sounds. No wheezing.  Musculoskeletal:       Arms:     Cervical back: Normal range of motion and neck  supple.  Skin:    General: Skin is warm and dry.     Capillary Refill: Capillary refill takes less than 2 seconds.     Findings: No erythema or rash.  Neurological:     Mental Status: She is alert and oriented to person, place, and time.     Motor: No abnormal muscle tone.     Coordination: Coordination normal.  Psychiatric:        Behavior: Behavior normal.     ED Results / Procedures / Treatments   Labs (all labs ordered are listed, but only abnormal results are displayed) Labs Reviewed  COMPREHENSIVE METABOLIC PANEL - Abnormal; Notable for the following components:      Result Value   Albumin 3.1 (*)    AST 13 (*)    Total Bilirubin 0.2 (*)    All other components within normal limits  CBC WITH DIFFERENTIAL/PLATELET - Abnormal; Notable for the following components:   WBC 13.1 (*)    Hemoglobin 10.5 (*)    MCV 73.9 (*)    MCH 21.2 (*)    MCHC 28.7 (*)    RDW 16.6 (*)    Platelets 428 (*)    Neutro Abs 9.9 (*)    Abs Immature Granulocytes 0.09 (*)    All other components within normal limits  CULTURE, BLOOD (ROUTINE X 2)  CULTURE, BLOOD (ROUTINE X 2)  SARS CORONAVIRUS 2 (TAT 6-24 HRS)  LACTIC ACID, PLASMA  LACTIC ACID, PLASMA  CBC  BASIC METABOLIC PANEL  I-STAT BETA HCG BLOOD, ED ([facility], [facility], [facility] ONLY)    EKG None  Radiology No results found.  Procedures Procedures (including critical care time)  Medications Ordered in ED Medications  vancomycin (VANCOREADY) IVPB 1250 mg/250 mL (has no administration in time range)  cefTRIAXone (ROCEPHIN) 2 g in sodium chloride 0.9 % 100 mL IVPB (has no administration in time range)  vancomycin (VANCOCIN) IVPB 750 mg/150 ml premix (has no administration in time range)  ondansetron (ZOFRAN) tablet 4 mg (has no administration in time range)    Or  ondansetron (ZOFRAN) injection 4 mg (has no administration in time range)  nicotine (NICODERM CQ - dosed in mg/24 hours) patch 21 mg (has no administration in time range)    buprenorphine-naloxone (SUBOXONE) 2-0.5 mg per SL tablet 1 tablet (has no administration in time range)  buprenorphine-naloxone (SUBOXONE) 8-2 mg  per SL tablet 1 tablet (has no administration in time range)  morphine 2 MG/ML injection 4 mg (4 mg Intravenous Given 03/11/20 1851)    ED Course  I have reviewed the triage vital signs and the nursing notes.  Pertinent labs & imaging results that were available during my care of the patient were reviewed by me and considered in my medical decision making (see chart for details).    MDM Rules/Calculators/A&P                     Spoke with pharmacy about starting IV antibiotics for this patient.  They recommended Rocephin and vancomycin.  I spoke with the [facility] hospitalist about admission for the patient.  I did speak with hand surgery due to the fact that there is an area of swelling and minimum cellulitis over the wrist.  He advised that he would like for me to ultrasound the wrist to further evaluate if there is an area of pus. I was asked by the hand surgeon Dr. Apolonio Schneiders to ultrasound her wrist to see if there was fluid or pus.  I attempted to do so and the patient was unable to tolerate me placing the ultrasound on her skin.  She kept moving and jerking her arm away.  I was unable to successfully see if she had any fluid collection or pus underneath the skin.  I spoke with the [facility] hospitalist after our initial conversation about admission.  And advised him that I was unable to do so due to her uncooperativeness. Final Clinical Impression(s) / ED Diagnoses Final diagnoses:  None    Rx / DC Orders ED Discharge Orders    None       Dalia Heading, PA-C 03/11/20 2124    Dalia Heading, PA-C 03/11/20 2126    Dorie Rank, MD 03/13/20 [phone]

## 2020-03-11 NOTE — Progress Notes (Signed)
Pharmacy Antibiotic Note  Meghan Bullock is a 29 y.o. female admitted on 03/11/2020 with forearm abscess (both arms, possible joint involvement, hx IVDU and MRSA).  Pharmacy has been consulted for vancomycin dosing.  Plan: Vancomycin 1250 mg IV x 1, then 750 mg IV every 12 hours Goal AUC 400-550. Expected AUC: 421 SCr used: 0.8 Monitor renal function, Cx and clinical progression to narrow Vancomycin levels at steady state Ceftriaxone 2g IV every 24 hours   Height: 5\' 4"  (162.6 cm) Weight: 61.2 kg (135 lb) IBW/kg (Calculated) : 54.7  Temp (24hrs), Avg:98.5 F (36.9 C), Min:98.5 F (36.9 C), Max:98.5 F (36.9 C)  Recent Labs  Lab 03/11/20 1730 03/11/20 1740  WBC  --  13.1*  CREATININE  --  0.69  LATICACIDVEN 1.3  --     Estimated Creatinine Clearance: 92 mL/min (by C-G formula based on SCr of 0.69 mg/dL).    Allergies  Allergen Reactions  . Acetaminophen Itching    03/13/20, PharmD Clinical Pharmacist ED Pharmacist Phone # [phone] 03/11/2020 7:01 PM

## 2020-03-11 NOTE — H&P (Signed)
History and Physical    [name]:[id number] DOB: [birth year] DOA: 03/11/2020  PCP: Patient, No Pcp Per  Patient coming from: Home  I have personally briefly reviewed patient's old medical records in [facility]  Chief Complaint: Abscess of upper extremities  HPI: Meghan Bullock is a 29 y.o. female with medical history significant for substance use disorder (IV heroin), recurrent abscesses who presents to the ED for evaluation of abscess/wounds of both upper extremities.  Patient reports chronic and ongoing IV drug use.  She states she last used IV heroin this morning (03/11/2020).  She noticed pain and decreased range of motion in her right wrist about 1 week ago.  Also reports a wound on her left forearm which was draining.  She has not had any associated fevers, chills, diaphoresis, chest pain, dyspnea, nausea, vomiting, abdominal pain, or diarrhea.  She presented to the ED for further evaluation.  Of note, patient has prior history of strep pyogenes bacteremia and MRSA wound abscess.  ED Course:  Initial vitals showed BP 117/84, pulse 109, RR 18, temp 98.5 Fahrenheit, SPO2 100% on room air.  Labs are notable for WBC 13.1, hemoglobin 10.5, platelets 428,000, sodium 139, potassium 3.5, bicarb 26, BUN 6, creatinine 0.69, lactic acid 1.3.  I-STAT beta-hCG <5.0.  Blood cultures were obtained and pending.  Patient was started on IV vancomycin and ceftriaxone.  EDP discussed with hand surgery who recommended ultrasound of the wrist to evaluate for fluid collection.  EDP attempted ultrasound however patient did not allow it to continue.  The hospitalist service was consulted today for further evaluation management.  Review of Systems: All systems reviewed and are negative except as documented in history of present illness above.   Past Medical History:  Diagnosis Date  . Anemia   . Anxiety   . Bipolar 1 disorder (HCC)   . Chlamydia   . Hepatitis   . Heroin abuse (HCC)   .  Mononucleosis   . Ovarian cyst   . Polysubstance abuse [facility])     Past Surgical History:  Procedure Laterality Date  . HAND SURGERY    . I & D EXTREMITY Left 09/16/2019   Procedure: IRRIGATION AND DEBRIDEMENT EXTREMITY;  Surgeon: Ernest Mallick, MD;  Location: [facility] OR;  Service: Orthopedics;  Laterality: Left;  . NO PAST SURGERIES      Social History:  reports that she has been smoking cigarettes. She has a 5.00 pack-year smoking history. She has never used smokeless tobacco. She reports current drug use. Drugs: IV and Heroin. She reports that she does not drink alcohol.  Allergies  Allergen Reactions  . Acetaminophen Itching    No family history on file.   Prior to Admission medications   Not on File    Physical Exam: Vitals:   03/11/20 1542 03/11/20 1543 03/11/20 1850  BP: 117/84  118/81  Pulse: (!) 109  86  Resp: 18  16  Temp: 98.5 F (36.9 C)    TempSrc: Oral    SpO2: 100%  99%  Weight:  61.2 kg   Height:  5\' 4"  (1.626 m)    Constitutional: Resting supine in bed, NAD, calm, comfortable Eyes: PERRL, lids and conjunctivae normal ENMT: Mucous membranes are moist. Posterior pharynx clear of any exudate or lesions.poor dentition.  Neck: normal, supple, no masses. Respiratory: clear to auscultation bilaterally, no wheezing, no crackles. Normal respiratory effort. No accessory muscle use.  Cardiovascular: Regular rate and rhythm, no murmurs / rubs /  gallops. No extremity edema. 2+ pedal pulses. Abdomen: no tenderness, no masses palpated. No hepatosplenomegaly. Bowel sounds positive.  Musculoskeletal: no clubbing / cyanosis. No joint deformity upper and lower extremities. ROM diminished at right wrist, no contractures. Normal muscle tone.  Skin: Multiple scars in various stages of healing bilateral upper extremities, left forearm wound with bandage in place, right wrist scarring and erythema present Neurologic: CN 2-12 grossly intact. Sensation intact, Strength 5/5  in all 4 extremities except for right wrist due to pain.  Psychiatric: Alert and oriented x 3.    Labs on Admission: I have personally reviewed following labs and imaging studies  CBC: Recent Labs  Lab 03/11/20 1740  WBC 13.1*  NEUTROABS 9.9*  HGB 10.5*  HCT 36.6  MCV 73.9*  PLT 616*   Basic Metabolic Panel: Recent Labs  Lab 03/11/20 1740  NA 139  K 3.5  CL 99  CO2 26  GLUCOSE 90  BUN 6  CREATININE 0.69  CALCIUM 9.4   GFR: Estimated Creatinine Clearance: 92 mL/min (by C-G formula based on SCr of 0.69 mg/dL). Liver Function Tests: Recent Labs  Lab 03/11/20 1740  AST 13*  ALT 9  ALKPHOS 93  BILITOT 0.2*  PROT 7.7  ALBUMIN 3.1*   No results for input(s): LIPASE, AMYLASE in the last 168 hours. No results for input(s): AMMONIA in the last 168 hours. Coagulation Profile: No results for input(s): INR, PROTIME in the last 168 hours. Cardiac Enzymes: No results for input(s): CKTOTAL, CKMB, CKMBINDEX, TROPONINI in the last 168 hours. BNP (last 3 results) No results for input(s): PROBNP in the last 8760 hours. HbA1C: No results for input(s): HGBA1C in the last 72 hours. CBG: No results for input(s): GLUCAP in the last 168 hours. Lipid Profile: No results for input(s): CHOL, HDL, LDLCALC, TRIG, CHOLHDL, LDLDIRECT in the last 72 hours. Thyroid Function Tests: No results for input(s): TSH, T4TOTAL, FREET4, T3FREE, THYROIDAB in the last 72 hours. Anemia Panel: No results for input(s): VITAMINB12, FOLATE, FERRITIN, TIBC, IRON, RETICCTPCT in the last 72 hours. Urine analysis:    Component Value Date/Time   COLORURINE YELLOW (A) 06/15/2018 0240   APPEARANCEUR HAZY (A) 06/15/2018 0240   APPEARANCEUR HAZY 01/03/2014 1101   LABSPEC 1.011 06/15/2018 0240   LABSPEC 1.020 01/03/2014 1101   PHURINE 6.0 06/15/2018 0240   GLUCOSEU NEGATIVE 06/15/2018 0240   GLUCOSEU NEGATIVE 01/03/2014 1101   HGBUR LARGE (A) 06/15/2018 0240   BILIRUBINUR NEGATIVE 06/15/2018 0240    BILIRUBINUR NEGATIVE 01/03/2014 1101   KETONESUR NEGATIVE 06/15/2018 0240   PROTEINUR NEGATIVE 06/15/2018 0240   UROBILINOGEN 0.2 02/04/2014 1156   NITRITE NEGATIVE 06/15/2018 0240   LEUKOCYTESUR MODERATE (A) 06/15/2018 0240   LEUKOCYTESUR NEGATIVE 01/03/2014 1101    Radiological Exams on Admission: No results found.  EKG: Not performed.  Assessment/Plan Principal Problem:   Abscess of wrist Active Problems:   Substance use disorder  Meghan Bullock is a 29 y.o. female with medical history significant for substance use disorder (IV heroin), recurrent abscesses who is admitted with right wrist abscess and left forearm wound/cellulitis.  Suspected abscess of right wrist Wound of left forearm with associated cellulitis: In setting of ongoing IV drug use.  There is suspected abscess of the right wrist.  Patient would not allow ultrasound to be performed. -Continue empiric IV vancomycin and ceftriaxone -Follow blood cultures -Obtain CT of right wrist with contrast to evaluate for abscess/fluid collection  Substance use disorder: Reports ongoing IV heroin use.  Last use  morning of 03/11/2020.  Reports a history of withdrawals.  She states she has benefited from Suboxone use previously. -Will start Suboxone to prevent withdrawal -Monitor opioid withdrawal scale  Tobacco use: Reports smoking 1 pack/day. -Place nicotine patch  DVT prophylaxis: SCDs Code Status: DNR, confirmed with patient Family Communication: Discussed with patient, she has discussed with family Consults called: EDP discussed with on-call hand surgery Admission status:  Status is: Inpatient  Remains inpatient appropriate because:IV treatments appropriate due to intensity of illness or inability to take PO   Dispo: The patient is from: Home              Anticipated d/c is to: Home, at risk to leave AMA              Anticipated d/c date is: > 3 days              Patient currently is not medically stable to  d/c.   Darreld Mclean MD [facility]  If 7PM-7AM, please contact night-coverage [email or web address]  03/11/2020, 8:01 PM

## 2020-03-11 NOTE — ED Notes (Signed)
Unsuccessful attempt to obtain blood cultures. Requested assistance from phlebotomy.

## 2020-03-11 NOTE — ED Notes (Signed)
Admitting at bedside 

## 2020-03-12 DIAGNOSIS — M25539 Pain in unspecified wrist: Secondary | ICD-10-CM

## 2020-03-12 DIAGNOSIS — Z8614 Personal history of Methicillin resistant Staphylococcus aureus infection: Secondary | ICD-10-CM

## 2020-03-12 DIAGNOSIS — L089 Local infection of the skin and subcutaneous tissue, unspecified: Secondary | ICD-10-CM

## 2020-03-12 DIAGNOSIS — Z888 Allergy status to other drugs, medicaments and biological substances status: Secondary | ICD-10-CM

## 2020-03-12 DIAGNOSIS — F17210 Nicotine dependence, cigarettes, uncomplicated: Secondary | ICD-10-CM

## 2020-03-12 DIAGNOSIS — L02419 Cutaneous abscess of limb, unspecified: Secondary | ICD-10-CM

## 2020-03-12 DIAGNOSIS — F1990 Other psychoactive substance use, unspecified, uncomplicated: Secondary | ICD-10-CM

## 2020-03-12 DIAGNOSIS — Z8619 Personal history of other infectious and parasitic diseases: Secondary | ICD-10-CM

## 2020-03-12 LAB — CBC
HCT: 33.6 % — ABNORMAL LOW (ref 36.0–46.0)
Hemoglobin: 9.9 g/dL — ABNORMAL LOW (ref 12.0–15.0)
MCH: 21.4 pg — ABNORMAL LOW (ref 26.0–34.0)
MCHC: 29.5 g/dL — ABNORMAL LOW (ref 30.0–36.0)
MCV: 72.7 fL — ABNORMAL LOW (ref 80.0–100.0)
Platelets: 436 10*3/uL — ABNORMAL HIGH (ref 150–400)
RBC: 4.62 MIL/uL (ref 3.87–5.11)
RDW: 16.7 % — ABNORMAL HIGH (ref 11.5–15.5)
WBC: 11.5 10*3/uL — ABNORMAL HIGH (ref 4.0–10.5)
nRBC: 0 % (ref 0.0–0.2)

## 2020-03-12 LAB — BASIC METABOLIC PANEL
Anion gap: 9 (ref 5–15)
BUN: 7 mg/dL (ref 6–20)
CO2: 26 mmol/L (ref 22–32)
Calcium: 9.2 mg/dL (ref 8.9–10.3)
Chloride: 103 mmol/L (ref 98–111)
Creatinine, Ser: 0.65 mg/dL (ref 0.44–1.00)
GFR calc Af Amer: >60 mL/min (ref >60)
GFR calc non Af Amer: >60 mL/min (ref >60)
Glucose, Bld: 105 mg/dL — ABNORMAL HIGH (ref 70–99)
Potassium: 4.1 mmol/L (ref 3.5–5.1)
Sodium: 138 mmol/L (ref 135–145)

## 2020-03-12 LAB — LACTIC ACID, PLASMA: Lactic Acid, Venous: 0.7 mmol/L (ref 0.5–1.9)

## 2020-03-12 LAB — SARS CORONAVIRUS 2 (TAT 6-24 HRS): SARS Coronavirus 2: NEGATIVE

## 2020-03-12 LAB — MRSA PCR SCREENING: MRSA by PCR: POSITIVE — AB

## 2020-03-12 MED ORDER — SODIUM CHLORIDE 0.9 % IV SOLN
3.0000 g | Freq: Four times a day (QID) | INTRAVENOUS | Status: DC
  Administered 2020-03-12 – 2020-03-13 (×4): 3 g via INTRAVENOUS
  Filled 2020-03-12 (×3): qty 8
  Filled 2020-03-12: qty 3
  Filled 2020-03-12 (×3): qty 8

## 2020-03-12 MED ORDER — LINEZOLID 600 MG/300ML IV SOLN: 600.0000 mg | Freq: Two times a day (BID) | INTRAVENOUS | Status: DC

## 2020-03-12 MED ORDER — MUPIROCIN 2 % EX OINT
TOPICAL_OINTMENT | Freq: Two times a day (BID) | CUTANEOUS | Status: DC
  Filled 2020-03-12: qty 22

## 2020-03-12 MED ORDER — LINEZOLID 600 MG PO TABS
600.0000 mg | ORAL_TABLET | Freq: Two times a day (BID) | ORAL | Status: DC
  Administered 2020-03-12 – 2020-03-13 (×2): 600 mg via ORAL
  Filled 2020-03-12 (×4): qty 1

## 2020-03-12 NOTE — Social Work (Signed)
CSW met with pt at bedside. CSW introduced self and explained her role. CSW completed sbirt with pt.  Pt scored a 0 on the sbirt scale. Pt denied alcohol use.   CSW inquired about pts substance use. Pt states shes uses IV heroin. Pt stated shes been using for a long time. Pt stated she wants to get off but doesn't know how. Pt stated shes tired of living like this. Pt stated she is homeless and has lost everything and has no one.  CSW and pt discussed how substance use has impacted her health and overall life. CSW offered pt resources and pt was receptive. CSW and pt reviewed options such as detox, residential and outpatient. Pt stated she is homeless so she thinks outpatient would be best for her. CSW encouraged pt to call the options listed on paper to see about availability and requirements. Pt stated she will later because she was feeling bad currently.  Emeterio Reeve, Latanya Presser, [place] Social Worker [phone]

## 2020-03-12 NOTE — Progress Notes (Signed)
Responded to consult for spiritual assessment. Meghan Bullock was sleeping and not responding to my call. I noticed her moving around her position. Will follow up at a later time.   Chaplain Resident Orest Dikes [phone]

## 2020-03-12 NOTE — Progress Notes (Signed)
Pharmacy Antibiotic Note  Meghan Bullock is a 29 y.o. female admitted on 03/11/2020 with UE abscesses. Pharmacy has been consulted for ampicillin/sulbactam dosing.  Per ID, vancomycin and CTX discontinued; starting linezolid and amp/sulb. Good renal fx. Hx strep pyogenes Bcx and MRSA wound abscesses, current cultures pending.   Plan: Start amp/sulb 3g Q6 hr Linezolid to PO per  IV to PO P&T protocol Monitor cultures, clinical status, renal fx Narrow abx as able and f/u duration    Height: 5\' 4"  (162.6 cm) Weight: 61.2 kg (135 lb) IBW/kg (Calculated) : 54.7  Temp (24hrs), Avg:98.2 F (36.8 C), Min:97.8 F (36.6 C), Max:98.5 F (36.9 C)  Recent Labs  Lab 03/11/20 1730 03/11/20 1740 03/12/20 0433  WBC  --  13.1* 11.5*  CREATININE  --  0.69 0.65  LATICACIDVEN 1.3  --  0.7    Estimated Creatinine Clearance: 92 mL/min (by C-G formula based on SCr of 0.65 mg/dL).     Antimicrobials this admission: Amp/sulb 4/15 >>  Linezolid 4/15>> Vanco 4/14 Ctx 4/14  Microbiology results: 4/14 BCx: pending  Thank you for allowing pharmacy to be a part of this patient's care.  5/14, PharmD, BCPS, BCCP Clinical Pharmacist  Please check AMION for all [facility] Pharmacy phone numbers After 10:00 PM, call Main Pharmacy [phone]

## 2020-03-12 NOTE — Consult Note (Signed)
[facility]       Reason for Consult: soft tissue infection    Referring Physician: Dr. [place]  Principal Problem:   Abscess of wrist Active Problems:   Substance use disorder   . buprenorphine-naloxone  1 tablet Sublingual BID  . linezolid  600 mg Oral Q12H  . nicotine  21 mg Transdermal Daily    Recommendations: linezolid augmentin   If she decides to leave AMA, I would recommend she gets one dose of long acting oritavancin IV prior to leaving (will take about 3 hours for the infusion).  Otherwise continue with antibiotics as above.   Assessment: She has a soft tissue infection with subcutaneous gas and fat stranding on the CT c/w soft tissue infection.  Multiple marks on her arms.   Clinically does not appear to be necrotizing but awaiting orthopedic evaluation.  No fever and WBC near normal.   Linezolid started and has good Staph and Strep coverage and anti toxin effect.  Antibiotics: Vancomycin and ceftriaxone  HPI: Meghan Bullock is a 29 y.o. female with history of IVDU, history of Strep pyogens abscess with bacteremia in February but left AMA, here with new abscess area.  CT with gas and infection but no abscess.  Patient does not offer much history.  Wrist with significant pain. Has had many ED visits.  Also with MRSA in previous cultures.     Review of Systems:  Constitutional: negative for fevers and chills Gastrointestinal: negative for nausea and diarrhea All other systems reviewed and are negative    Past Medical History:  Diagnosis Date  . Anemia   . Anxiety   . Bipolar 1 disorder (HCC)   . Chlamydia   . Hepatitis   . Heroin abuse (HCC)   . Mononucleosis   . Ovarian cyst   . Polysubstance abuse (HCC)     Social History   Tobacco Use  . Smoking status: Current Every Day Smoker    Packs/day: 1.00    Years: 5.00    Pack years: 5.00    Types: Cigarettes  . Smokeless tobacco: Never Used  Substance Use Topics  .  Alcohol use: No  . Drug use: Yes    Types: IV, Heroin    Comment:  heroin      No family history on file.  Allergies  Allergen Reactions  . Acetaminophen Itching    Physical Exam: Constitutional: in no apparent distress  Vitals:   03/12/20 0053 03/12/20 0204  BP:  116/74  Pulse: 84 69  Resp: 16 17  Temp:  97.8 F (36.6 C)  SpO2: 99% 100%   EYES: anicteric ENMT: Does not cooperate with exam Musculoskeletal: multiple marks on arm, warmth, tenderness   Lab Results  Component Value Date   WBC 11.5 (H) 03/12/2020   HGB 9.9 (L) 03/12/2020   HCT 33.6 (L) 03/12/2020   MCV 72.7 (L) 03/12/2020   PLT 436 (H) 03/12/2020    Lab Results  Component Value Date   CREATININE 0.65 03/12/2020   BUN 7 03/12/2020   NA 138 03/12/2020   K 4.1 03/12/2020   CL 103 03/12/2020   CO2 26 03/12/2020    Lab Results  Component Value Date   ALT 9 03/11/2020   AST 13 (L) 03/11/2020   ALKPHOS 93 03/11/2020     Microbiology: Recent Results (from the past 240 hour(s))  SARS CORONAVIRUS 2 (TAT 6-24 HRS) Nasopharyngeal Nasopharyngeal Swab     Status: None  Collection Time: 03/11/20  8:51 PM   Specimen: Nasopharyngeal Swab  Result Value Ref Range Status   SARS Coronavirus 2 NEGATIVE NEGATIVE Final    Comment: (NOTE) SARS-CoV-2 target nucleic acids are NOT DETECTED. The SARS-CoV-2 RNA is generally detectable in upper and lower respiratory specimens during the acute phase of infection. Negative results do not preclude SARS-CoV-2 infection, do not rule out co-infections with other pathogens, and should not be used as the sole basis for treatment or other patient management decisions. Negative results must be combined with clinical observations, patient history, and epidemiological information. The expected result is Negative. Fact Sheet for Patients: SugarRoll.be Fact Sheet for Healthcare Providers: https://www.woods-mathews.com/ This test is  not yet approved or cleared by the [place] FDA and  has been authorized for detection and/or diagnosis of SARS-CoV-2 by FDA under an Emergency Use Authorization (EUA). This EUA will remain  in effect (meaning this test can be used) for the duration of the COVID-19 declaration under Section 56 4(b)(1) of the Act, 21 U.S.C. section 360bbb-3(b)(1), unless the authorization is terminated or revoked sooner. Performed at [facility], [place] [address], [place], [place] [address]     Emil Klassen W Melvin Whiteford, [place] for Infectious Disease [facility] [email or web address] 03/12/2020, 11:01 AM

## 2020-03-12 NOTE — Consult Note (Signed)
WOC Nurse Consult Note: Patient receiving care in [facility] 5N31. Consult completed remotely after review of record, including photos of wounds Reason for Consult: BUE wounds related to IV drug use Wound type: infectious Pressure Injury POA: Yes/No/NA Measurement: See photos of wounds Wound bed: Drainage (amount, consistency, odor)  Periwound: Dressing procedure/placement/frequency:  Wash all wound areas on BUEs with soap and water. Place Xeroform gauzes over any open, draining areas. Secure with kerlex if possible.  If not possible to secure with kerlex, top and gauze and tape in place.  Change daily and prn soilage. Monitor the wound area(s) for worsening of condition such as: Signs/symptoms of infection,  Increase in size,  Development of or worsening of odor, Development of pain, or increased pain at the affected locations.  Notify the medical team if any of these develop.  Thank you for the consult. WOC nurse will not follow at this time.  Please re-consult the WOC team if needed.  Helmut Muster, RN, MSN, CWOCN, CNS-BC, pager [phone]

## 2020-03-12 NOTE — Plan of Care (Signed)
  Problem: Education: Goal: Knowledge of General Education information will improve Description: Including pain rating scale, medication(s)/side effects and non-pharmacologic comfort measures Outcome: Pr Problem: Pain Managment: Goal: General experience of comfort will improve Outcome: Progressing   Problem: Safety: Goal: Ability to remain free from injury will improve Outcome: Progressing   Problem: Skin Integrity: Goal: Risk for impaired skin integrity will decrease Outcome: Progressing

## 2020-03-12 NOTE — Plan of Care (Signed)

## 2020-03-12 NOTE — Progress Notes (Signed)
PROGRESS NOTE    Meghan Bullock  EXH:[id number] DOB: 1991/03/06 DOA: 03/11/2020 PCP: Patient, No Pcp Per    Brief Narrative:  Meghan Bullock is a 29 y.o. female with medical history significant for substance use disorder (IV heroin), recurrent abscesses, MRSA strep bactermia who presents to the ED for evaluation of abscess/wounds of both upper extremities.  Patient reports chronic and ongoing IV drug use.  She states she last used IV heroin this morning (03/11/2020).  She noticed pain and decreased range of motion in her right wrist about 1 week ago.  Also reports a wound on her left forearm which was draining.  She has not had any associated fevers, chills, diaphoresis, chest pain, dyspnea, nausea, vomiting, abdominal pain, or diarrhea.  She presented to the ED for further evaluation.  Of note, patient has prior history of strep pyogenes bacteremia and MRSA wound abscess.  Initial vitals showed BP 117/84, pulse 109, RR 18, temp 98.5 Fahrenheit, SPO2 100% on room air.  Labs are notable for WBC 13.1, hemoglobin 10.5, platelets 428,000, sodium 139, potassium 3.5, bicarb 26, BUN 6, creatinine 0.69, lactic acid 1.3.  I-STAT beta-hCG <5.0.  Blood cultures were obtained and pending.  Patient was started on IV vancomycin and ceftriaxone.  EDP discussed with hand surgery who recommended ultrasound of the wrist to evaluate for fluid collection.  EDP attempted ultrasound however patient did not allow it to continue.  The hospitalist service was consulted today for further evaluation management.   Assessment & Plan:   Principal Problem:   Abscess of wrist Active Problems:   Substance use disorder   Right/left forearm cellulitis Patient presents to ED with progressive redness and pain to bilateral forearms, complicated by history of current substance abuse with IVDU heroin, last used 03/11/2020 and history of MRSA/strep pyogenes bacteremia.  CT right wrist with subcutaneous gas and subcu fat  stranding in medial right forearm consistent with infection, no definite fluid collection.  Discussed case with orthopedics, Silvestre Gunner who personally reviewed imaging and evaluated patient at bedside and recommended medical management with antibiotics, no surgical intervention required at this time as he does not believe this is a necrotizing infection at this time. --Infectious disease following, appreciate assistance --Blood cultures: Pending --Vancomycin/ceftriaxone changed to linezolid and Unasyn today --If patient decides to leave AMA, ID recommends 1 dose of long-acting oritavancin IV prior to leaving    Left forearm wound, present on admission --Wound care following, continue recommendations  Substance use disorder: Reports ongoing IV heroin use.  Last use morning of 03/11/2020.  Reports a history of withdrawals.  She states she has benefited from Suboxone use previously. --continue Suboxone to prevent withdrawal --Monitor opioid withdrawal scale  Tobacco use: Reports smoking 1 pack/day. --nicotine patch  DVT prophylaxis: SCDs Code Status: DNR, confirmed with patient Family Communication: Discussed with patient at bedside  Disposition Plan:  Status is: Inpatient  Remains inpatient appropriate because:Ongoing diagnostic testing needed not appropriate for outpatient work up, Unsafe d/c plan, IV treatments appropriate due to intensity of illness or inability to take PO and Inpatient level of care appropriate due to severity of illness   Dispo: The patient is from: Home              Anticipated d/c is to: Home              Anticipated d/c date is: > 3 days              Patient currently is not  medically stable to d/c.    Consultants:   Dwyane Luo  ID - Dr. Luciana Axe  Procedures:   none  Antimicrobials:   Vancomycin 4/14 - 4/15  Ceftriaxone 4/14 - 4/15  Linezolid 4/15>>  Unasyn 4/15>>   Subjective: Patient seen and examined bedside, not very  interactive with interview this morning.  Pulls sheets over her head.  Request light to be turned off.  States that it feels like her redness to her forearms are improved.  Denies any recent injections to her right wrist site.  Does not engage any further conversation this morning.  Denies chest pain, no headache, no shortness of breath.  No acute events overnight per nursing staff.  Objective: Vitals:   03/11/20 1850 03/11/20 2309 03/12/20 0053 03/12/20 0204  BP: 118/81 (!) 133/93  116/74  Pulse: 86 83 84 69  Resp: 16  16 17   Temp:    97.8 F (36.6 C)  TempSrc:    Oral  SpO2: 99% 98% 99% 100%  Weight:      Height:        Intake/Output Summary (Last 24 hours) at 03/12/2020 1456 Last data filed at 03/12/2020 0500 Gross per 24 hour  Intake 100 ml  Output --  Net 100 ml   Filed Weights   03/11/20 1543  Weight: 61.2 kg    Examination:  General exam: Appears calm and comfortable  Respiratory system: Clear to auscultation. Respiratory effort normal. Cardiovascular system: S1 & S2 heard, RRR. No JVD, murmurs, rubs, gallops or clicks. No pedal edema. Gastrointestinal system: Abdomen is nondistended, soft and nontender. No organomegaly or masses felt. Normal bowel sounds heard. Central nervous system: Alert and oriented. No focal neurological deficits. Extremities: Right wrist range of motion decreased secondary to pain, otherwise normal strength throughout Skin: Multiple scars in various stages of healing bilateral upper extremities, left forearm wound noted, right wrist scarring and erythema present Psychiatry: Judgement and insight appear poor.  Depressed mood & flat affect.      Data Reviewed: I have personally reviewed following labs and imaging studies  CBC: Recent Labs  Lab 03/11/20 1740 03/12/20 0433  WBC 13.1* 11.5*  NEUTROABS 9.9*  --   HGB 10.5* 9.9*  HCT 36.6 33.6*  MCV 73.9* 72.7*  PLT 428* 436*   Basic Metabolic Panel: Recent Labs  Lab 03/11/20 1740  03/12/20 0433  NA 139 138  K 3.5 4.1  CL 99 103  CO2 26 26  GLUCOSE 90 105*  BUN 6 7  CREATININE 0.69 0.65  CALCIUM 9.4 9.2   GFR: Estimated Creatinine Clearance: 92 mL/min (by C-G formula based on SCr of 0.65 mg/dL). Liver Function Tests: Recent Labs  Lab 03/11/20 1740  AST 13*  ALT 9  ALKPHOS 93  BILITOT 0.2*  PROT 7.7  ALBUMIN 3.1*   No results for input(s): LIPASE, AMYLASE in the last 168 hours. No results for input(s): AMMONIA in the last 168 hours. Coagulation Profile: No results for input(s): INR, PROTIME in the last 168 hours. Cardiac Enzymes: No results for input(s): CKTOTAL, CKMB, CKMBINDEX, TROPONINI in the last 168 hours. BNP (last 3 results) No results for input(s): PROBNP in the last 8760 hours. HbA1C: No results for input(s): HGBA1C in the last 72 hours. CBG: No results for input(s): GLUCAP in the last 168 hours. Lipid Profile: No results for input(s): CHOL, HDL, LDLCALC, TRIG, CHOLHDL, LDLDIRECT in the last 72 hours. Thyroid Function Tests: No results for input(s): TSH, T4TOTAL, FREET4, T3FREE, THYROIDAB  in the last 72 hours. Anemia Panel: No results for input(s): VITAMINB12, FOLATE, FERRITIN, TIBC, IRON, RETICCTPCT in the last 72 hours. Sepsis Labs: Recent Labs  Lab 03/11/20 1730 03/12/20 0433  LATICACIDVEN 1.3 0.7    Recent Results (from the past 240 hour(s))  SARS CORONAVIRUS 2 (TAT 6-24 HRS) Nasopharyngeal Nasopharyngeal Swab     Status: None   Collection Time: 03/11/20  8:51 PM   Specimen: Nasopharyngeal Swab  Result Value Ref Range Status   SARS Coronavirus 2 NEGATIVE NEGATIVE Final    Comment: (NOTE) SARS-CoV-2 target nucleic acids are NOT DETECTED. The SARS-CoV-2 RNA is generally detectable in upper and lower respiratory specimens during the acute phase of infection. Negative results do not preclude SARS-CoV-2 infection, do not rule out co-infections with other pathogens, and should not be used as the sole basis for treatment or  other patient management decisions. Negative results must be combined with clinical observations, patient history, and epidemiological information. The expected result is Negative. Fact Sheet for Patients: HairSlick.no Fact Sheet for Healthcare Providers: quierodirigir.com This test is not yet approved or cleared by the [place] FDA and  has been authorized for detection and/or diagnosis of SARS-CoV-2 by FDA under an Emergency Use Authorization (EUA). This EUA will remain  in effect (meaning this test can be used) for the duration of the COVID-19 declaration under Section 56 4(b)(1) of the Act, 21 U.S.C. section 360bbb-3(b)(1), unless the authorization is terminated or revoked sooner. Performed at [facility], [address] [place], [place] [address]          Radiology Studies: CT WRIST RIGHT WO CONTRAST  Result Date: 03/11/2020 CLINICAL DATA:  IV drug abuse, Recurrent abscess, wrist pain EXAM: CT OF THE RIGHT WRIST WITHOUT CONTRAST TECHNIQUE: Multidetector CT imaging of the right wrist was performed according to the standard protocol. Multiplanar CT image reconstructions were also generated. COMPARISON:  09/14/2019 FINDINGS: Bones/Joint/Cartilage No acute or destructive bony lesions.  No radiopaque foreign bodies. Ligaments Suboptimally assessed by CT. Muscles and Tendons Limited evaluation by CT without IV contrast. Soft tissues Evaluation of the soft tissues severely limited without IV contrast. There is subcutaneous gas within the medial aspect of the right forearm adjacent to the mid ulnar diaphysis. There is subcutaneous fat stranding throughout the medial aspect of the mid forearm. I do not see any definite fluid collection on this limited unenhanced exam. IMPRESSION: 1. Subcutaneous gas and subcutaneous fat stranding within the medial right forearm. Findings are consistent with infection. 2. No definite fluid  collection on this limited unenhanced exam. 3. No acute bony abnormality. Electronically Signed   By: Sharlet Salina M.D.   On: 03/11/2020 23:51      Scheduled Meds: . buprenorphine-naloxone  1 tablet Sublingual BID  . linezolid  600 mg Oral Q12H  . nicotine  21 mg Transdermal Daily   Continuous Infusions: . ampicillin-sulbactam (UNASYN) IV 3 g (03/12/20 1200)     LOS: 1 day    Time spent: 38 minutes spent on chart review, discussion with nursing staff, consultants, updating family and interview/physical exam; more than 50% of that time was spent in counseling and/or coordination of care.   Alvira Philips [place], DO [facility] Available via Epic secure chat 7am-7pm After these hours, please refer to coverage provider listed on [email or web address] 03/12/2020, 2:56 PM

## 2020-03-13 DIAGNOSIS — F1910 Other psychoactive substance abuse, uncomplicated: Secondary | ICD-10-CM

## 2020-03-13 LAB — BASIC METABOLIC PANEL
Anion gap: 11 (ref 5–15)
BUN: 9 mg/dL (ref 6–20)
CO2: 24 mmol/L (ref 22–32)
Calcium: 9.3 mg/dL (ref 8.9–10.3)
Chloride: 103 mmol/L (ref 98–111)
Creatinine, Ser: 0.65 mg/dL (ref 0.44–1.00)
GFR calc Af Amer: >60 mL/min (ref >60)
GFR calc non Af Amer: >60 mL/min (ref >60)
Glucose, Bld: 100 mg/dL — ABNORMAL HIGH (ref 70–99)
Potassium: 4 mmol/L (ref 3.5–5.1)
Sodium: 138 mmol/L (ref 135–145)

## 2020-03-13 LAB — CBC
HCT: 36.5 % (ref 36.0–46.0)
Hemoglobin: 10.8 g/dL — ABNORMAL LOW (ref 12.0–15.0)
MCH: 21.3 pg — ABNORMAL LOW (ref 26.0–34.0)
MCHC: 29.6 g/dL — ABNORMAL LOW (ref 30.0–36.0)
MCV: 72.1 fL — ABNORMAL LOW (ref 80.0–100.0)
Platelets: 476 10*3/uL — ABNORMAL HIGH (ref 150–400)
RBC: 5.06 MIL/uL (ref 3.87–5.11)
RDW: 16.6 % — ABNORMAL HIGH (ref 11.5–15.5)
WBC: 8.1 10*3/uL (ref 4.0–10.5)
nRBC: 0 % (ref 0.0–0.2)

## 2020-03-13 MED ORDER — ORITAVANCIN DIPHOSPHATE 400 MG IV SOLR
1200.0000 mg | Freq: Once | INTRAVENOUS | Status: AC
  Administered 2020-03-13: 1200 mg via INTRAVENOUS
  Filled 2020-03-13: qty 120

## 2020-03-13 MED ORDER — METHADONE HCL 10 MG PO TABS
10.0000 mg | ORAL_TABLET | Freq: Three times a day (TID) | ORAL | Status: DC
  Administered 2020-03-13: 10 mg via ORAL
  Filled 2020-03-13: qty 1

## 2020-03-13 MED ORDER — ENOXAPARIN SODIUM 40 MG/0.4ML ~~LOC~~ SOLN
40.0000 mg | SUBCUTANEOUS | Status: DC
  Administered 2020-03-13: 40 mg via SUBCUTANEOUS
  Filled 2020-03-13: qty 0.4

## 2020-03-13 MED ORDER — LINEZOLID 600 MG PO TABS: 600.0000 mg | ORAL_TABLET | Freq: Two times a day (BID) | ORAL | 0 refills | Status: DC

## 2020-03-13 NOTE — Discharge Summary (Signed)
[facility] Physician Discharge Summary  [name] XTK:[id number] DOB: 1991/11/23 DOA: 03/11/2020  PCP: Patient, No Pcp Per  Admit date: 03/11/2020 Discharge date: 03/13/2020  Admitted From: Home/homeless Disposition: Eloped, left AGAINST MEDICAL ADVICE  History of present illness:  Meghan Bullock a 29 y.o.femalewith medical history significant forsubstance use disorder (IV heroin), recurrent abscesses, MRSA strep bactermia who presents to the ED for evaluation of abscess/wounds of both upper extremities.Patient reports chronic and ongoing IV drug use. She states she last used IV heroin this morning (03/11/2020). She noticed pain and decreased range of motion in her right wrist about 1 week ago. Also reports a wound on her left forearm which was draining. She has not had any associated fevers, chills, diaphoresis, chest pain, dyspnea, nausea, vomiting, abdominal pain, or diarrhea. She presented to the ED for further evaluation.  Of note, patient has prior history of strep pyogenes bacteremia and MRSA wound abscess.  Initial vitals showed BP 117/84, pulse 109, RR 18, temp 98.5 Fahrenheit, SPO2 100% on room air. Labs are notable for WBC 13.1, hemoglobin 10.5, platelets 428,000, sodium 139, potassium 3.5, bicarb 26, BUN 6, creatinine 0.69, lactic acid 1.3. I-STAT beta-hCG <5.0. Blood cultures were obtained and pending. Patient was started on IV vancomycin and ceftriaxone.EDP discussed with hand surgerywho recommended ultrasound of the wrist to evaluate for fluid collection. EDP attempted ultrasound however patient did not allow it to continue. The hospitalist service was consulted today for further evaluation management.  Hospital course:  Right/left forearm cellulitis Patient presents to ED with progressive redness and pain to bilateral forearms, complicated by history of current substance abuse with IVDU heroin, last used 03/11/2020 and history of MRSA/strep pyogenes  bacteremia.  CT right wrist with subcutaneous gas and subcu fat stranding in medial right forearm consistent with infection, no definite fluid collection.  Discussed case with orthopedics, Silvestre Gunner who personally reviewed imaging and evaluated patient at bedside and recommended medical management with antibiotics, no surgical intervention required at this time as he does not believe this is a necrotizing infection at this time.  Infectious disease was consulted and followed during hospital course.  Blood cultures remained negative x2 days prior to eloping/leaving AMA.  Patient was initially started on vancomycin and ceftriaxone which was changed to linezolid and Unasyn by infectious disease.  Given her continued threats to leave AMA, antibiotics were changed to oritavancin 1200mg  IV received 1 dose as this has a long half-life roughly lasting 2 weeks per ID.  Prior to finishing these antibiotics, patient was noted to be not found in her room at 12:38 PM today; in which she was in the room roughly 30 minutes prior when nursing was doing there routine rounds with the nurse tech.  She apparently left the room with her IV access in her left forearm.  Patient did not notify any staff members about her leaving.  Nursing notified the nursing supervisor, [place] and the [facility] Department regarding an IV drug user leaving/eloping with a IV catheter in place.  Left forearm wound, present on admission Wound care was consulted with the following recommendations during hospitalization:   Wash all wound areas on BUEs with soap and water. Place Xeroform gauzes over any open, draining areas. Secure with kerlex if possible. If not possible to secure with kerlex, top and gauze and tape in place. Change daily and prn soilage.  Substance use disorder: Reports ongoing IV heroin use. Last use morning of 03/11/2020. Reports a history of withdrawals.She states she  has benefited from Suboxone use  previously; now requesting narcotics.  Patient Suboxone was changed to methadone 10 mg p.o. every 8 hours, she received 1 dose prior to her eloping.  Tobacco use: Reports smoking 1 pack/day.  Was provided a nicotine patch during her hospitalization.  Discharge Diagnoses:  Principal Problem:   Abscess of wrist Active Problems:   Substance use disorder    Discharge Instructions     Allergies  Allergen Reactions  . Acetaminophen Itching    Consultations:  Ortho Charma Igo  ID - Dr. Luciana Axe   Procedures/Studies: CT WRIST RIGHT WO CONTRAST  Result Date: 03/11/2020 CLINICAL DATA:  IV drug abuse, Recurrent abscess, wrist pain EXAM: CT OF THE RIGHT WRIST WITHOUT CONTRAST TECHNIQUE: Multidetector CT imaging of the right wrist was performed according to the standard protocol. Multiplanar CT image reconstructions were also generated. COMPARISON:  09/14/2019 FINDINGS: Bones/Joint/Cartilage No acute or destructive bony lesions.  No radiopaque foreign bodies. Ligaments Suboptimally assessed by CT. Muscles and Tendons Limited evaluation by CT without IV contrast. Soft tissues Evaluation of the soft tissues severely limited without IV contrast. There is subcutaneous gas within the medial aspect of the right forearm adjacent to the mid ulnar diaphysis. There is subcutaneous fat stranding throughout the medial aspect of the mid forearm. I do not see any definite fluid collection on this limited unenhanced exam. IMPRESSION: 1. Subcutaneous gas and subcutaneous fat stranding within the medial right forearm. Findings are consistent with infection. 2. No definite fluid collection on this limited unenhanced exam. 3. No acute bony abnormality. Electronically Signed   By: Sharlet Salina M.D.   On: 03/11/2020 23:51      Subjective: Patient eloped, left AMA without advising any staff members.  Nursing supervisor, [facility] security in [facility] Department were notified patient left  AMA/eloped with IV catheter in place.   Discharge Exam: Vitals:   03/13/20 0350 03/13/20 0721  BP: (!) 140/92 (!) 142/90  Pulse: (!) 59 60  Resp: 17 16  Temp:  98 F (36.7 C)  SpO2: 100% 100%   Vitals:   03/12/20 1532 03/12/20 2048 03/13/20 0350 03/13/20 0721  BP: (!) 143/96 (!) 143/95 (!) 140/92 (!) 142/90  Pulse: 65 73 (!) 59 60  Resp: 16 17 17 16   Temp: 98.4 F (36.9 C) (!) 97.4 F (36.3 C)  98 F (36.7 C)  TempSrc: Oral Oral  Oral  SpO2: 100% 99% 100% 100%  Weight:      Height:          The results of significant diagnostics from this hospitalization (including imaging, microbiology, ancillary and laboratory) are listed below for reference.     Microbiology: Recent Results (from the past 240 hour(s))  Culture, blood (routine x 2)     Status: None (Preliminary result)   Collection Time: 03/11/20  5:41 PM   Specimen: BLOOD  Result Value Ref Range Status   Specimen Description BLOOD RIGHT ANTECUBITAL  Final   Special Requests   Final    BOTTLES DRAWN AEROBIC ONLY Blood Culture adequate volume   Culture   Final    NO GROWTH 2 DAYS Performed at [facility], [address] [place], [place]    Report Status PENDING  Incomplete  SARS CORONAVIRUS 2 (TAT 6-24 HRS) Nasopharyngeal Nasopharyngeal Swab     Status: None   Collection Time: 03/11/20  8:51 PM   Specimen: Nasopharyngeal Swab  Result Value Ref Range Status   SARS Coronavirus 2  NEGATIVE NEGATIVE Final    Comment: (NOTE) SARS-CoV-2 target nucleic acids are NOT DETECTED. The SARS-CoV-2 RNA is generally detectable in upper and lower respiratory specimens during the acute phase of infection. Negative results do not preclude SARS-CoV-2 infection, do not rule out co-infections with other pathogens, and should not be used as the sole basis for treatment or other patient management decisions. Negative results must be combined with clinical observations, patient history, and epidemiological  information. The expected result is Negative. Fact Sheet for Patients: HairSlick.no Fact Sheet for Healthcare Providers: quierodirigir.com This test is not yet approved or cleared by the [place] FDA and  has been authorized for detection and/or diagnosis of SARS-CoV-2 by FDA under an Emergency Use Authorization (EUA). This EUA will remain  in effect (meaning this test can be used) for the duration of the COVID-19 declaration under Section 56 4(b)(1) of the Act, 21 U.S.C. section 360bbb-3(b)(1), unless the authorization is terminated or revoked sooner. Performed at [facility], [address] [place], [place] [address]   MRSA PCR Screening     Status: Abnormal   Collection Time: 03/12/20 10:58 AM   Specimen: Nasopharyngeal  Result Value Ref Range Status   MRSA by PCR POSITIVE (A) NEGATIVE Final    Comment:        The GeneXpert MRSA Assay (FDA approved for NASAL specimens only), is one component of a comprehensive MRSA colonization surveillance program. It is not intended to diagnose MRSA infection nor to guide or monitor treatment for MRSA infections. RESULT CALLED TO, READ BACK BY AND VERIFIED WITH: Redmond Baseman RN 16:20 03/12/20 ([email or web address]) Performed at [facility], [address], [place], [place] [address]      Labs: BNP (last 3 results) No results for input(s): BNP in the last 8760 hours. Basic Metabolic Panel: Recent Labs  Lab 03/11/20 1740 03/12/20 0433 03/13/20 0841  NA 139 138 138  K 3.5 4.1 4.0  CL 99 103 103  CO2 26 26 24   GLUCOSE 90 105* 100*  BUN 6 7 9   CREATININE 0.69 0.65 0.65  CALCIUM 9.4 9.2 9.3   Liver Function Tests: Recent Labs  Lab 03/11/20 1740  AST 13*  ALT 9  ALKPHOS 93  BILITOT 0.2*  PROT 7.7  ALBUMIN 3.1*   No results for input(s): LIPASE, AMYLASE in the last 168 hours. No results for input(s): AMMONIA in the last 168 hours. CBC: Recent Labs  Lab  03/11/20 1740 03/12/20 0433 03/13/20 0841  WBC 13.1* 11.5* 8.1  NEUTROABS 9.9*  --   --   HGB 10.5* 9.9* 10.8*  HCT 36.6 33.6* 36.5  MCV 73.9* 72.7* 72.1*  PLT 428* 436* 476*   Cardiac Enzymes: No results for input(s): CKTOTAL, CKMB, CKMBINDEX, TROPONINI in the last 168 hours. BNP: Invalid input(s): POCBNP CBG: No results for input(s): GLUCAP in the last 168 hours. D-Dimer No results for input(s): DDIMER in the last 72 hours. Hgb A1c No results for input(s): HGBA1C in the last 72 hours. Lipid Profile No results for input(s): CHOL, HDL, LDLCALC, TRIG, CHOLHDL, LDLDIRECT in the last 72 hours. Thyroid function studies No results for input(s): TSH, T4TOTAL, T3FREE, THYROIDAB in the last 72 hours.  Invalid input(s): FREET3 Anemia work up No results for input(s): VITAMINB12, FOLATE, FERRITIN, TIBC, IRON, RETICCTPCT in the last 72 hours. Urinalysis    Component Value Date/Time   COLORURINE YELLOW (A) 06/15/2018 0240   APPEARANCEUR HAZY (A) 06/15/2018 0240   APPEARANCEUR HAZY 01/03/2014 1101  LABSPEC 1.011 06/15/2018 0240   LABSPEC 1.020 01/03/2014 1101   PHURINE 6.0 06/15/2018 0240   GLUCOSEU NEGATIVE 06/15/2018 0240   GLUCOSEU NEGATIVE 01/03/2014 1101   HGBUR LARGE (A) 06/15/2018 0240   BILIRUBINUR NEGATIVE 06/15/2018 0240   BILIRUBINUR NEGATIVE 01/03/2014 1101   KETONESUR NEGATIVE 06/15/2018 0240   PROTEINUR NEGATIVE 06/15/2018 0240   UROBILINOGEN 0.2 02/04/2014 1156   NITRITE NEGATIVE 06/15/2018 0240   LEUKOCYTESUR MODERATE (A) 06/15/2018 0240   LEUKOCYTESUR NEGATIVE 01/03/2014 1101   Sepsis Labs Invalid input(s): PROCALCITONIN,  WBC,  LACTICIDVEN Microbiology Recent Results (from the past 240 hour(s))  Culture, blood (routine x 2)     Status: None (Preliminary result)   Collection Time: 03/11/20  5:41 PM   Specimen: BLOOD  Result Value Ref Range Status   Specimen Description BLOOD RIGHT ANTECUBITAL  Final   Special Requests   Final    BOTTLES DRAWN AEROBIC  ONLY Blood Culture adequate volume   Culture   Final    NO GROWTH 2 DAYS Performed at [facility], [address], [place], [place] [address]    Report Status PENDING  Incomplete  SARS CORONAVIRUS 2 (TAT 6-24 HRS) Nasopharyngeal Nasopharyngeal Swab     Status: None   Collection Time: 03/11/20  8:51 PM   Specimen: Nasopharyngeal Swab  Result Value Ref Range Status   SARS Coronavirus 2 NEGATIVE NEGATIVE Final    Comment: (NOTE) SARS-CoV-2 target nucleic acids are NOT DETECTED. The SARS-CoV-2 RNA is generally detectable in upper and lower respiratory specimens during the acute phase of infection. Negative results do not preclude SARS-CoV-2 infection, do not rule out co-infections with other pathogens, and should not be used as the sole basis for treatment or other patient management decisions. Negative results must be combined with clinical observations, patient history, and epidemiological information. The expected result is Negative. Fact Sheet for Patients: HairSlick.no Fact Sheet for Healthcare Providers: quierodirigir.com This test is not yet approved or cleared by the [place] FDA and  has been authorized for detection and/or diagnosis of SARS-CoV-2 by FDA under an Emergency Use Authorization (EUA). This EUA will remain  in effect (meaning this test can be used) for the duration of the COVID-19 declaration under Section 56 4(b)(1) of the Act, 21 U.S.C. section 360bbb-3(b)(1), unless the authorization is terminated or revoked sooner. Performed at [facility], [address], [place], [place] [address]   MRSA PCR Screening     Status: Abnormal   Collection Time: 03/12/20 10:58 AM   Specimen: Nasopharyngeal  Result Value Ref Range Status   MRSA by PCR POSITIVE (A) NEGATIVE Final    Comment:        The GeneXpert MRSA Assay (FDA approved for NASAL specimens only), is one component of a comprehensive MRSA  colonization surveillance program. It is not intended to diagnose MRSA infection nor to guide or monitor treatment for MRSA infections. RESULT CALLED TO, READ BACK BY AND VERIFIED WITH: Redmond Baseman RN 16:20 03/12/20 ([email or web address]) Performed at [facility], [address] [place], [place] [address]      Time coordinating discharge: Over 30 minutes  SIGNED:   Alvira Philips [place], DO  [facility] 03/13/2020, 4:18 PM

## 2020-03-13 NOTE — Progress Notes (Signed)
[facility]   Reason for visit: Follow up on soft tissue infection  Interval History: started oritavancin but IV not working well. Will get IV team to look at other areas, though concern that patient not cooperative.  WBC 8.1, afebrile.    Physical Exam: Constitutional:  Vitals:   03/13/20 0350 03/13/20 0721  BP: (!) 140/92 (!) 142/90  Pulse: (!) 59 60  Resp: 17 16  Temp:  98 F (36.7 C)  SpO2: 100% 100%   patient appears in NAD Respiratory: Normal respiratory effort; CTA B Cardiovascular: RRR GI: soft, nt, nd MS: right wrist with some warmth but no drainage  Review of Systems: Constitutional: negative for fevers and chills Gastrointestinal: negative for diarrhea  Lab Results  Component Value Date   WBC 8.1 03/13/2020   HGB 10.8 (L) 03/13/2020   HCT 36.5 03/13/2020   MCV 72.1 (L) 03/13/2020   PLT 476 (H) 03/13/2020    Lab Results  Component Value Date   CREATININE 0.65 03/13/2020   BUN 9 03/13/2020   NA 138 03/13/2020   K 4.0 03/13/2020   CL 103 03/13/2020   CO2 24 03/13/2020    Lab Results  Component Value Date   ALT 9 03/11/2020   AST 13 (L) 03/11/2020   ALKPHOS 93 03/11/2020     Microbiology: Recent Results (from the past 240 hour(s))  Culture, blood (routine x 2)     Status: None (Preliminary result)   Collection Time: 03/11/20  5:41 PM   Specimen: BLOOD  Result Value Ref Range Status   Specimen Description BLOOD RIGHT ANTECUBITAL  Final   Special Requests   Final    BOTTLES DRAWN AEROBIC ONLY Blood Culture adequate volume   Culture   Final    NO GROWTH < 24 HOURS Performed at [facility], [address] [place], [place] [address]    Report Status PENDING  Incomplete  SARS CORONAVIRUS 2 (TAT 6-24 HRS) Nasopharyngeal Nasopharyngeal Swab     Status: None   Collection Time: 03/11/20  8:51 PM   Specimen: Nasopharyngeal Swab  Result Value Ref Range Status   SARS Coronavirus 2 NEGATIVE NEGATIVE Final   Comment: (NOTE) SARS-CoV-2 target nucleic acids are NOT DETECTED. The SARS-CoV-2 RNA is generally detectable in upper and lower respiratory specimens during the acute phase of infection. Negative results do not preclude SARS-CoV-2 infection, do not rule out co-infections with other pathogens, and should not be used as the sole basis for treatment or other patient management decisions. Negative results must be combined with clinical observations, patient history, and epidemiological information. The expected result is Negative. Fact Sheet for Patients: HairSlick.no Fact Sheet for Healthcare Providers: quierodirigir.com This test is not yet approved or cleared by the [place] FDA and  has been authorized for detection and/or diagnosis of SARS-CoV-2 by FDA under an Emergency Use Authorization (EUA). This EUA will remain  in effect (meaning this test can be used) for the duration of the COVID-19 declaration under Section 56 4(b)(1) of the Act, 21 U.S.C. section 360bbb-3(b)(1), unless the authorization is terminated or revoked sooner. Performed at [facility], [address], [place], [place] [address]   MRSA PCR Screening     Status: Abnormal   Collection Time: 03/12/20 10:58 AM   Specimen: Nasopharyngeal  Result Value Ref Range Status   MRSA by PCR POSITIVE (A) NEGATIVE Final    Comment:        The GeneXpert MRSA  Assay (FDA approved for NASAL specimens only), is one component of a comprehensive MRSA colonization surveillance program. It is not intended to diagnose MRSA infection nor to guide or monitor treatment for MRSA infections. RESULT CALLED TO, READ BACK BY AND VERIFIED WITH: Eulogio Ditch RN 16:20 03/12/20 ([email or web address]) Performed at [facility], [address], [place], [place] [address]     Impression/Plan:  1. Soft tissue infection - started oritavancin but at this time not able to complete it due to  access issues.  Nursing trying to get new access to continue if possible.   Will get her a supply of 2 weeks of linezolid if she is unable to get the oritavancin as she may leave AMA.    2.  Substance abuse - ongoing issue and counseled.

## 2020-03-13 NOTE — Progress Notes (Signed)
PROGRESS NOTE    Meghan Bullock  XKP:[id number] DOB: [birth year] DOA: 03/11/2020 PCP: Patient, No Pcp Per    Brief Narrative:  Meghan Bullock is a 29 y.o. female with medical history significant for substance use disorder (IV heroin), recurrent abscesses, MRSA strep bactermia who presents to the ED for evaluation of abscess/wounds of both upper extremities.  Patient reports chronic and ongoing IV drug use.  She states she last used IV heroin this morning (03/11/2020).  She noticed pain and decreased range of motion in her right wrist about 1 week ago.  Also reports a wound on her left forearm which was draining.  She has not had any associated fevers, chills, diaphoresis, chest pain, dyspnea, nausea, vomiting, abdominal pain, or diarrhea.  She presented to the ED for further evaluation.  Of note, patient has prior history of strep pyogenes bacteremia and MRSA wound abscess.  Initial vitals showed BP 117/84, pulse 109, RR 18, temp 98.5 Fahrenheit, SPO2 100% on room air.  Labs are notable for WBC 13.1, hemoglobin 10.5, platelets 428,000, sodium 139, potassium 3.5, bicarb 26, BUN 6, creatinine 0.69, lactic acid 1.3.  I-STAT beta-hCG <5.0.  Blood cultures were obtained and pending.  Patient was started on IV vancomycin and ceftriaxone.  EDP discussed with hand surgery who recommended ultrasound of the wrist to evaluate for fluid collection.  EDP attempted ultrasound however patient did not allow it to continue.  The hospitalist service was consulted today for further evaluation management.   Assessment & Plan:   Principal Problem:   Abscess of wrist Active Problems:   Substance use disorder   Right/left forearm cellulitis Patient presents to ED with progressive redness and pain to bilateral forearms, complicated by history of current substance abuse with IVDU heroin, last used 03/11/2020 and history of MRSA/strep pyogenes bacteremia.  CT right wrist with subcutaneous gas and subcu fat  stranding in medial right forearm consistent with infection, no definite fluid collection.  Discussed case with orthopedics, Charma Igo who personally reviewed imaging and evaluated patient at bedside and recommended medical management with antibiotics, no surgical intervention required at this time as he does not believe this is a necrotizing infection at this time. --Infectious disease following, appreciate assistance --Blood cultures: No growth less than 24 hours --Vancomycin/ceftriaxone changed to linezolid and Unasyn and now receiving 1 dose of long-acting oritavancin 1200mg  IV today; although having IV access issues --ID plans to get her a supply of linezolid for 2 weeks if she is unable to get oritavancin as she may leave AMA  Left forearm wound, present on admission --Wound care following, continue recommendations  Wash all wound areas on BUEs with soap and water. Place Xeroform gauzes over any open, draining areas. Secure with kerlex if possible.  If not possible to secure with kerlex, top and gauze and tape in place.  Change daily and prn soilage.  Substance use disorder: Reports ongoing IV heroin use.  Last use morning of 03/11/2020.  Reports a history of withdrawals.  She states she has benefited from Suboxone use previously; now requesting narcotics.03/13/2020 --Discontinue Suboxone today --Start methadone 10 mg p.o. q8h today, discussed with pharmacy --Monitor opioid withdrawal scale  Tobacco use: Reports smoking 1 pack/day. --nicotine patch  DVT prophylaxis: SCDs, Lovenox Code Status: DNR, confirmed with patient by admitting physician Family Communication: Discussed with patient at bedside  Disposition Plan:  Status is: Inpatient  Remains inpatient appropriate because:Ongoing diagnostic testing needed not appropriate for outpatient work up, Unsafe d/c plan, IV treatments  appropriate due to intensity of illness or inability to take PO and Inpatient level of care appropriate due  to severity of illness   Dispo: The patient is from: Home              Anticipated d/c is to: Home              Anticipated d/c date is: > 3 days              Patient currently is not medically stable to d/c.    Consultants:   Dwyane Luo  ID - Dr. Luciana Axe  Procedures:   none  Antimicrobials:   Vancomycin 4/14 - 4/15  Ceftriaxone 4/14 - 4/15  Linezolid 4/15 - 4/16  Unasyn 4/15 - 4/16   Subjective: Patient seen and examined bedside, uncooperative with exam and conversation this morning.  States Suboxone not working and wants narcotic medication.  Offered methadone and exchange of Suboxone, states "those medicines do not work".  Discussed with patient given her active IV heroin abuse will not prescribe any narcotics.  She states "then I will leave".  No further engagement in conversation this morning.  Nursing staff reports noncompliance with dressing changes overnight, otherwise no acute events overnight.  Objective: Vitals:   03/12/20 1532 03/12/20 2048 03/13/20 0350 03/13/20 0721  BP: (!) 143/96 (!) 143/95 (!) 140/92 (!) 142/90  Pulse: 65 73 (!) 59 60  Resp: 16 17 17 16   Temp: 98.4 F (36.9 C) (!) 97.4 F (36.3 C)  98 F (36.7 C)  TempSrc: Oral Oral  Oral  SpO2: 100% 99% 100% 100%  Weight:      Height:        Intake/Output Summary (Last 24 hours) at 03/13/2020 1055 Last data filed at 03/12/2020 1500 Gross per 24 hour  Intake 100 ml  Output --  Net 100 ml   Filed Weights   03/11/20 1543  Weight: 61.2 kg    Examination:  General exam: Appears calm and comfortable  Respiratory system: Clear to auscultation. Respiratory effort normal. Cardiovascular system: S1 & S2 heard, RRR. No JVD, murmurs, rubs, gallops or clicks. No pedal edema. Gastrointestinal system: Abdomen is nondistended, soft and nontender. No organomegaly or masses felt. Normal bowel sounds heard. Central nervous system: Alert and oriented. No focal neurological  deficits. Extremities: Right wrist range of motion decreased secondary to pain, otherwise normal strength throughout Skin: Multiple scars in various stages of healing bilateral upper extremities, left forearm wound noted, right wrist scarring and erythema present Psychiatry: Judgement and insight appear poor.  Depressed mood & flat affect.      Data Reviewed: I have personally reviewed following labs and imaging studies  CBC: Recent Labs  Lab 03/11/20 1740 03/12/20 0433 03/13/20 0841  WBC 13.1* 11.5* 8.1  NEUTROABS 9.9*  --   --   HGB 10.5* 9.9* 10.8*  HCT 36.6 33.6* 36.5  MCV 73.9* 72.7* 72.1*  PLT 428* 436* 476*   Basic Metabolic Panel: Recent Labs  Lab 03/11/20 1740 03/12/20 0433 03/13/20 0841  NA 139 138 138  K 3.5 4.1 4.0  CL 99 103 103  CO2 26 26 24   GLUCOSE 90 105* 100*  BUN 6 7 9   CREATININE 0.69 0.65 0.65  CALCIUM 9.4 9.2 9.3   GFR: Estimated Creatinine Clearance: 91.2 mL/min (by C-G formula based on SCr of 0.65 mg/dL). Liver Function Tests: Recent Labs  Lab 03/11/20 1740  AST 13*  ALT 9  ALKPHOS 93  BILITOT  0.2*  PROT 7.7  ALBUMIN 3.1*   No results for input(s): LIPASE, AMYLASE in the last 168 hours. No results for input(s): AMMONIA in the last 168 hours. Coagulation Profile: No results for input(s): INR, PROTIME in the last 168 hours. Cardiac Enzymes: No results for input(s): CKTOTAL, CKMB, CKMBINDEX, TROPONINI in the last 168 hours. BNP (last 3 results) No results for input(s): PROBNP in the last 8760 hours. HbA1C: No results for input(s): HGBA1C in the last 72 hours. CBG: No results for input(s): GLUCAP in the last 168 hours. Lipid Profile: No results for input(s): CHOL, HDL, LDLCALC, TRIG, CHOLHDL, LDLDIRECT in the last 72 hours. Thyroid Function Tests: No results for input(s): TSH, T4TOTAL, FREET4, T3FREE, THYROIDAB in the last 72 hours. Anemia Panel: No results for input(s): VITAMINB12, FOLATE, FERRITIN, TIBC, IRON, RETICCTPCT in the  last 72 hours. Sepsis Labs: Recent Labs  Lab 03/11/20 1730 03/12/20 0433  LATICACIDVEN 1.3 0.7    Recent Results (from the past 240 hour(s))  Culture, blood (routine x 2)     Status: None (Preliminary result)   Collection Time: 03/11/20  5:41 PM   Specimen: BLOOD  Result Value Ref Range Status   Specimen Description BLOOD RIGHT ANTECUBITAL  Final   Special Requests   Final    BOTTLES DRAWN AEROBIC ONLY Blood Culture adequate volume   Culture   Final    NO GROWTH < 24 HOURS Performed at [facility], [address], [place], [place] [address]    Report Status PENDING  Incomplete  SARS CORONAVIRUS 2 (TAT 6-24 HRS) Nasopharyngeal Nasopharyngeal Swab     Status: None   Collection Time: 03/11/20  8:51 PM   Specimen: Nasopharyngeal Swab  Result Value Ref Range Status   SARS Coronavirus 2 NEGATIVE NEGATIVE Final    Comment: (NOTE) SARS-CoV-2 target nucleic acids are NOT DETECTED. The SARS-CoV-2 RNA is generally detectable in upper and lower respiratory specimens during the acute phase of infection. Negative results do not preclude SARS-CoV-2 infection, do not rule out co-infections with other pathogens, and should not be used as the sole basis for treatment or other patient management decisions. Negative results must be combined with clinical observations, patient history, and epidemiological information. The expected result is Negative. Fact Sheet for Patients: SugarRoll.be Fact Sheet for Healthcare Providers: https://www.woods-mathews.com/ This test is not yet approved or cleared by the [place] FDA and  has been authorized for detection and/or diagnosis of SARS-CoV-2 by FDA under an Emergency Use Authorization (EUA). This EUA will remain  in effect (meaning this test can be used) for the duration of the COVID-19 declaration under Section 56 4(b)(1) of the Act, 21 U.S.C. section 360bbb-3(b)(1), unless the authorization is  terminated or revoked sooner. Performed at [facility], [place] [address] [place], [place] [address]   MRSA PCR Screening     Status: Abnormal   Collection Time: 03/12/20 10:58 AM   Specimen: Nasopharyngeal  Result Value Ref Range Status   MRSA by PCR POSITIVE (A) NEGATIVE Final    Comment:        The GeneXpert MRSA Assay (FDA approved for NASAL specimens only), is one component of a comprehensive MRSA colonization surveillance program. It is not intended to diagnose MRSA infection nor to guide or monitor treatment for MRSA infections. RESULT CALLED TO, READ BACK BY AND VERIFIED WITH: Eulogio Ditch RN 16:20 03/12/20 ([email or web address]) Performed at [facility], [place] [address] [place], [place] [address]  Radiology Studies: CT WRIST RIGHT WO CONTRAST  Result Date: 03/11/2020 CLINICAL DATA:  IV drug abuse, Recurrent abscess, wrist pain EXAM: CT OF THE RIGHT WRIST WITHOUT CONTRAST TECHNIQUE: Multidetector CT imaging of the right wrist was performed according to the standard protocol. Multiplanar CT image reconstructions were also generated. COMPARISON:  09/14/2019 FINDINGS: Bones/Joint/Cartilage No acute or destructive bony lesions.  No radiopaque foreign bodies. Ligaments Suboptimally assessed by CT. Muscles and Tendons Limited evaluation by CT without IV contrast. Soft tissues Evaluation of the soft tissues severely limited without IV contrast. There is subcutaneous gas within the medial aspect of the right forearm adjacent to the mid ulnar diaphysis. There is subcutaneous fat stranding throughout the medial aspect of the mid forearm. I do not see any definite fluid collection on this limited unenhanced exam. IMPRESSION: 1. Subcutaneous gas and subcutaneous fat stranding within the medial right forearm. Findings are consistent with infection. 2. No definite fluid collection on this limited unenhanced exam. 3. No acute bony abnormality. Electronically Signed   By: Sharlet Salina  M.D.   On: 03/11/2020 23:51      Scheduled Meds: . enoxaparin (LOVENOX) injection  40 mg Subcutaneous Q24H  . methadone  10 mg Oral Q8H  . mupirocin ointment   Nasal BID  . nicotine  21 mg Transdermal Daily   Continuous Infusions: . oritavancin (ORBACTIV) IVPB 1,200 mg (03/13/20 1032)     LOS: 2 days    Time spent: 36 minutes spent on chart review, discussion with nursing staff, consultants, updating family and interview/physical exam; more than 50% of that time was spent in counseling and/or coordination of care.   Alvira Philips [place], DO [facility] Available via Epic secure chat 7am-7pm After these hours, please refer to coverage provider listed on [email or web address] 03/13/2020, 10:55 AM

## 2020-03-13 NOTE — Progress Notes (Signed)
1200: patient in room at time of rounding with Becky, NT.   1238:Patient not found in room, Patient left with IV access in left forearm.  Patient was medicated with methadone at 1145, and at that time discussed with patient that Dr. [place] agreed to change patient from suboxone to methadone to assist patient in comfort and if she would allow IV antibiotics to infuse then we could continue to discuss what we could be done to allow patient to receive treatment and be comfortable within reasonable standards.  Patient agreed.    1245:  Notified Dr. [place] face to face of patient's AMA without notice.  Patient still has IV access in.   Security notified of patient's leave from unit  [employer], Joya San [phone] notified of patient's leave from unit.  Jasmine December notified of patient's leave from unit.   Safety zone completed.

## 2020-03-13 NOTE — Progress Notes (Signed)
Patient refused BLE hand  wound dressing this morning, day shift nurse notified, will continue to monitor.

## 2020-03-16 LAB — CULTURE, BLOOD (ROUTINE X 2)
Culture: NO GROWTH
Special Requests: ADEQUATE

## 2020-08-25 ENCOUNTER — Emergency Department (HOSPITAL_COMMUNITY): Admission: EM | Admit: 2020-08-25 | Discharge: 2020-08-25 | Discharge status: Against Medical Advice | Payer: Self-pay

## 2020-08-25 NOTE — ED Notes (Signed)
No answer for triage.

## 2020-09-06 ENCOUNTER — Other Ambulatory Visit: Payer: Self-pay

## 2020-09-06 ENCOUNTER — Emergency Department (HOSPITAL_COMMUNITY)
Admission: EM | Admit: 2020-09-06 | Discharge: 2020-09-07 | Disposition: A | Discharge status: Home / Self Care | Payer: Self-pay | Attending: Emergency Medicine | Admitting: Emergency Medicine

## 2020-09-06 ENCOUNTER — Encounter (HOSPITAL_COMMUNITY): Payer: Self-pay

## 2020-09-06 DIAGNOSIS — F17210 Nicotine dependence, cigarettes, uncomplicated: Secondary | ICD-10-CM | POA: Insufficient documentation

## 2020-09-06 DIAGNOSIS — Z3A01 Less than 8 weeks gestation of pregnancy: Secondary | ICD-10-CM | POA: Insufficient documentation

## 2020-09-06 DIAGNOSIS — O26899 Other specified pregnancy related conditions, unspecified trimester: Secondary | ICD-10-CM | POA: Insufficient documentation

## 2020-09-06 LAB — URINALYSIS, ROUTINE W REFLEX MICROSCOPIC
Glucose, UA: NEGATIVE mg/dL
Hgb urine dipstick: NEGATIVE
Ketones, ur: 5 mg/dL — AB
Leukocytes,Ua: NEGATIVE
Nitrite: NEGATIVE
Protein, ur: 30 mg/dL — AB
Specific Gravity, Urine: 1.027 (ref 1.005–1.030)
pH: 5 (ref 5.0–8.0)

## 2020-09-06 LAB — COMPREHENSIVE METABOLIC PANEL
ALT: 22 U/L (ref 0–44)
AST: 22 U/L (ref 15–41)
Albumin: 3.4 g/dL — ABNORMAL LOW (ref 3.5–5.0)
Alkaline Phosphatase: 69 U/L (ref 38–126)
Anion gap: 10 (ref 5–15)
BUN: 9 mg/dL (ref 6–20)
CO2: 25 mmol/L (ref 22–32)
Calcium: 8.9 mg/dL (ref 8.9–10.3)
Chloride: 99 mmol/L (ref 98–111)
Creatinine, Ser: 0.6 mg/dL (ref 0.44–1.00)
GFR, Estimated: >60 mL/min (ref >60)
Glucose, Bld: 105 mg/dL — ABNORMAL HIGH (ref 70–99)
Potassium: 3.1 mmol/L — ABNORMAL LOW (ref 3.5–5.1)
Sodium: 134 mmol/L — ABNORMAL LOW (ref 135–145)
Total Bilirubin: 0.5 mg/dL (ref 0.3–1.2)
Total Protein: 7 g/dL (ref 6.5–8.1)

## 2020-09-06 LAB — CBC
HCT: 35.3 % — ABNORMAL LOW (ref 36.0–46.0)
Hemoglobin: 10.6 g/dL — ABNORMAL LOW (ref 12.0–15.0)
MCH: 24.2 pg — ABNORMAL LOW (ref 26.0–34.0)
MCHC: 30 g/dL (ref 30.0–36.0)
MCV: 80.6 fL (ref 80.0–100.0)
Platelets: 238 10*3/uL (ref 150–400)
RBC: 4.38 MIL/uL (ref 3.87–5.11)
RDW: 15.8 % — ABNORMAL HIGH (ref 11.5–15.5)
WBC: 5.8 10*3/uL (ref 4.0–10.5)
nRBC: 0 % (ref 0.0–0.2)

## 2020-09-06 LAB — RAPID URINE DRUG SCREEN, HOSP PERFORMED
Amphetamines: POSITIVE — AB
Barbiturates: NOT DETECTED
Benzodiazepines: NOT DETECTED
Cocaine: NOT DETECTED
Opiates: POSITIVE — AB
Tetrahydrocannabinol: POSITIVE — AB

## 2020-09-06 LAB — I-STAT BETA HCG BLOOD, ED (MC, WL, AP ONLY): I-stat hCG, quantitative: 130.3 m[IU]/mL — ABNORMAL HIGH (ref <5)

## 2020-09-06 LAB — ETHANOL: Alcohol, Ethyl (B): <10 mg/dL (ref <10)

## 2020-09-06 LAB — LIPASE, BLOOD: Lipase: 26 U/L (ref 11–51)

## 2020-09-06 NOTE — ED Triage Notes (Signed)
Pt presents to ED with abd pain and cold symptoms.  States she is pregnant but has not had any prenatal visits.  Was incarcerated until 08-19-20.

## 2020-09-06 NOTE — ED Triage Notes (Signed)
Emergency Medicine Provider Triage Evaluation Note  Meghan Bullock , a 29 y.o. female  was evaluated in triage.  Pt complains of abdominal pain x 1 week and being pregnant. I was initially asked to come screen patient for [facility] however no pregnancy test on record - istat beta hcg ordered which has returned positive at 130.3. On further evaluation pt reports that she has been incarcerated from April 2021 - September 22nd 2021. She states that since May she has had 1.5 day periods every single month; last period on 09/11. Pt then states she has been pregnant since March prior to being incarcerated. However pt quickly proceeds to say she has had periods every single month that last 1.5 days total. She denies have intercourse with anyone in the prison. She denies fevers, chills, nausea, vomiting, diarrhea, vaginal bleeding, rush of fluids, or any other associated symptoms.  Review of Systems  Positive: + abdominal pain Negative: - fever, - chills, - nausea, - vomiting, - diarrhea, - vaginal bleeding/discharge  Physical Exam  BP 130/78 (BP Location: Left Arm)   Pulse (!) 121   Temp 98 F (36.7 C) (Oral)   Resp 18   SpO2 97%  Gen:   Awake, no distress, does appear to be under the influence HEENT:  Atraumatic Resp:  Normal effort Cardiac:  Normal rate  Abd:   Nondistended, mild TTP to abdomen MSK:   Moves extremities without difficulty  Neuro:  Speech clear   Medical Decision Making  Medically screening exam initiated at 10:05 PM.  Appropriate orders placed.  Bobbye Charleston was informed that the remainder of the evaluation will be completed by another provider, this initial triage assessment does not replace that evaluation, and the importance of remaining in the ED until their evaluation is complete.  Clinical Impression  29 year old female presenting to the ED today with complaint of being pregnant and having abdominal pain. Pt's beta hcg has returned positive at 130.3 however she reports she has  been pregnant since March 2021 prior to being incarcerated from April-September. On exam pt does not appear clinically pregnant/does not appear to be roughly 7 months pregnant. She has mild abdominal TTP. Vitals are stable however pt mildly tachycardic at 121. She does appear to be under the influence of some kind. I have spoken with Suzette Battiest at the [facility] and given such low positive hcg level she does not believe pt needs to be seen by them today. It does appear that clinically pt may have other things going on including being under the influence today. She will be placed back in the waiting room at this time.    Tanda Rockers, PA-C 09/06/20 2211

## 2020-09-07 ENCOUNTER — Other Ambulatory Visit: Payer: Self-pay

## 2020-09-07 LAB — HCG, QUANTITATIVE, PREGNANCY: hCG, Beta Chain, Quant, S: 183 m[IU]/mL — ABNORMAL HIGH (ref <5)

## 2020-09-07 NOTE — ED Notes (Signed)
outside

## 2020-09-07 NOTE — ED Provider Notes (Signed)
[facility] EMERGENCY DEPARTMENT Provider Note   CSN: [id number] Arrival date & time: 09/06/20  2044     History Chief Complaint  Patient presents with  . Abdominal Pain    Meghan Bullock is a 29 y.o. female.  HPI   Patient with significant medical history of polysubstance abuse, bipolar presents emergency department with chief complaint of left lower abdominal pain.  Patient states the pain has been going on  for the last 2 weeks.  She explains she has this sharp episodic pain in her left lower abdominal quadrant.  The pain will come and last for 1 second and then go away on its own.  Patient states she normally notices it when she is hungry, denies any recent trauma.  She does endorse that she believes she is pregnant she was recently incarcerated and was released last month.  She states while she was incarcerated in May she felt something kick in her stomach and believes that she was pregnant.  She did have a pregnancy test done in prison which came back negative.  She is here today to see if she is really pregnant.  She denies any pelvic pain, vaginal discharge, and states she is has had her menstrual cycle consistently for the last 5 months around the 11th of each month  will last for about 2 days.  She denies any urinary symptoms, no urgency, frequency, dysuria, hematuria, lower back pain.  Patient denies headache, fever, chills, shortness of breath, chest pain, dumping, nausea, vomiting, diarrhea, pedal edema.  Past Medical History:  Diagnosis Date  . Anemia   . Anxiety   . Bipolar 1 disorder ([place])   . Chlamydia   . Hepatitis   . Heroin abuse ([facility])   . Mononucleosis   . Ovarian cyst   . Polysubstance abuse [facility]     Patient Active Problem List   Diagnosis Date Noted  . Abscess of wrist 03/11/2020  . Substance use disorder 03/11/2020  . Cellulitis and abscess of hand   . 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[place])   . Normal labor 06/17/2018  . Postpartum care following  vaginal delivery 06/17/2018  . Encounter for induction of labor 06/15/2018  . Iron deficiency anemia 04/30/2018  . Supervision of high risk pregnancy, antepartum 10/25/2017  . Substance abuse affecting pregnancy, antepartum 10/25/2017  . H/O preterm delivery, currently pregnant, first trimester 10/25/2017  . Hepatitis C 10/23/2017  . Preterm premature rupture of membranes (PPROM) with unknown onset of labor 05/12/2014  . Encephalopathy, toxic 10/03/2013  . Benzodiazepine abuse ([place]) 10/03/2013  . Heroin abuse [facility])     Past Surgical History:  Procedure Laterality Date  . HAND SURGERY    . I & D EXTREMITY Left 09/16/2019   Procedure: IRRIGATION AND DEBRIDEMENT EXTREMITY;  Surgeon: Verner Mould, MD;  Location: [place];  Service: Orthopedics;  Laterality: Left;  . NO PAST SURGERIES       OB History    Gravida  4   Para  2   Term  1   Preterm  1   AB  2   Living  2     SAB  2   TAB      Ectopic      Multiple  0   Live Births  2           No family history on file.  Social History   Tobacco Use  . Smoking status: Current Every Day Smoker  Packs/day: 1.50    Years: 5.00    Pack years: 7.50    Types: Cigarettes  . Smokeless tobacco: Never Used  Vaping Use  . Vaping Use: Never used  Substance Use Topics  . Alcohol use: No  . Drug use: Yes    Types: Heroin, Marijuana    Comment:  heroin      Home Medications Prior to Admission medications   Medication Sig Start Date End Date Taking? Authorizing Provider  linezolid (ZYVOX) 600 MG tablet Take 1 tablet (600 mg total) by mouth 2 (two) times daily. 03/13/20   [place] Eric J, DO    Allergies    Acetaminophen  Review of Systems   Review of Systems  Constitutional: Negative for chills and fever.  HENT: Negative for congestion, tinnitus, trouble swallowing and voice change.   Eyes: Negative for visual disturbance.  Respiratory: Negative for cough and shortness of breath.   Cardiovascular:  Negative for chest pain and palpitations.  Gastrointestinal: Positive for abdominal pain. Negative for diarrhea, nausea and vomiting.  Genitourinary: Negative for decreased urine volume, dysuria, enuresis, hematuria, menstrual problem, vaginal bleeding and vaginal discharge.  Musculoskeletal: Negative for back pain.  Skin: Negative for rash.  Neurological: Negative for dizziness, light-headedness and headaches.  Hematological: Does not bruise/bleed easily.    Physical Exam Updated Vital Signs BP 110/70 (BP Location: Right Arm)   Pulse 90   Temp 98.1 F (36.7 C) (Oral)   Resp 16   Ht 5' 4"  (1.626 m)   Wt 61.2 kg   SpO2 98%   BMI 23.16 kg/m   Physical Exam Vitals and nursing note reviewed.  Constitutional:      General: She is not in acute distress.    Appearance: She is not ill-appearing.  HENT:     Head: Normocephalic and atraumatic.     Nose: No congestion.     Mouth/Throat:     Mouth: Mucous membranes are moist.     Pharynx: Oropharynx is clear.  Eyes:     General: No scleral icterus. Cardiovascular:     Rate and Rhythm: Normal rate and regular rhythm.     Pulses: Normal pulses.     Heart sounds: No murmur heard.  No friction rub. No gallop.   Pulmonary:     Effort: No respiratory distress.     Breath sounds: No wheezing, rhonchi or rales.  Abdominal:     General: There is no distension.     Palpations: Abdomen is soft.     Tenderness: There is no abdominal tenderness. There is no right CVA tenderness, left CVA tenderness or guarding.  Musculoskeletal:        General: No swelling.     Right lower leg: No edema.     Left lower leg: No edema.  Skin:    General: Skin is warm and dry.     Capillary Refill: Capillary refill takes less than 2 seconds.     Findings: No rash.     Comments: Skin exam was performed no lacerations, abrasions, ecchymosis, track marks or other gross hemorrhage noted during skin exam.  No erythematous, swollen joints noted.  Neurological:       Mental Status: She is alert.  Psychiatric:        Mood and Affect: Mood normal.     ED Results / Procedures / Treatments   Labs (all labs ordered are listed, but only abnormal results are displayed) Labs Reviewed  COMPREHENSIVE METABOLIC PANEL - Abnormal; Notable for  the following components:      Result Value   Sodium 134 (*)    Potassium 3.1 (*)    Glucose, Bld 105 (*)    Albumin 3.4 (*)    All other components within normal limits  CBC - Abnormal; Notable for the following components:   Hemoglobin 10.6 (*)    HCT 35.3 (*)    MCH 24.2 (*)    RDW 15.8 (*)    All other components within normal limits  URINALYSIS, ROUTINE W REFLEX MICROSCOPIC - Abnormal; Notable for the following components:   Color, Urine AMBER (*)    APPearance CLOUDY (*)    Bilirubin Urine SMALL (*)    Ketones, ur 5 (*)    Protein, ur 30 (*)    Bacteria, UA FEW (*)    All other components within normal limits  RAPID URINE DRUG SCREEN, HOSP PERFORMED - Abnormal; Notable for the following components:   Opiates POSITIVE (*)    Amphetamines POSITIVE (*)    Tetrahydrocannabinol POSITIVE (*)    All other components within normal limits  HCG, QUANTITATIVE, PREGNANCY - Abnormal; Notable for the following components:   hCG, Beta Chain, Quant, S 183 (*)    All other components within normal limits  I-STAT BETA HCG BLOOD, ED ([facility], [facility], [facility] ONLY) - Abnormal; Notable for the following components:   I-stat hCG, quantitative 130.3 (*)    All other components within normal limits  LIPASE, BLOOD  ETHANOL  I-STAT BETA HCG BLOOD, ED ([facility], [facility], [facility] ONLY)    EKG None  Radiology No results found.  Procedures Procedures (including critical care time)  Medications Ordered in ED Medications - No data to display  ED Course  I have reviewed the triage vital signs and the nursing notes.  Pertinent labs & imaging results that were available during my care of the patient were reviewed by me and considered in my  medical decision making (see chart for details).    MDM Rules/Calculators/A&P                          I have personally reviewed all imaging, labs and have interpreted them.  Patient presents for evaluation of left lower quadrant pain and concerns of being pregnant.  She is alert, did not appear in acute distress, vital signs reassuring.  Will order screening labs for further evaluation.  CBC negative for leukocytosis, shows normocytic anemia which appears to be at baseline for patient.  CMP showing hyponatremia 134, hypokalemia of 3.1, hyperglycemia 105, hypo albumin 3.4, no AKI, no anion gap.  Lipase 26, UA showing small amount of ketones protein, negative nitrates or leukocytes, few bacteria.  Rapid urine drug screen shows positive opioids, amphetamines, tetrahydrocannabinoids.  I-STAT beta hCG was 130, hCG quantitative pregnancy was 183.  Nurse came out notified me that patient eloped, she states patient was upset that she cannot have her partner come back to see her.  I went to locate patient but she was nowhere to be found.  I have low suspicion for ovarian torsion as patient denies pelvic pain, abnormal vaginal bleeding or discharge.  Patient was nontender to palpation in her pelvic region.  Low suspicion for ectopic pregnancy as hCG quantitative was 183, she denies pelvic pain, no pelvic tenderness during exam.  Low suspicion for UTI or pyelonephritis as she denies urinary symptoms, UA negative for nitrates or leukocytes, negative CVA tenderness.  Low suspicion for systemic infection as patient  is nontoxic-appearing, vital signs reassuring, no obvious source infection on exam.  Low suspicion for intra-abdominal abnormality current surgical invention like diverticulitis, small bowel obstruction, appendicitis, or gallbladder abnormalities as patient denies abdominal pain, liver enzymes as well as alk phos are within normal limits, no acute abdomen noted on exam.  I suspect patient is pregnant  but cannot fully rule out ectopic pregnancy.  Would recommend follow-up with OB/GYN for hCG trending.   Final Clinical Impression(s) / ED Diagnoses Final diagnoses:  Less than [redacted] weeks gestation of pregnancy    Rx / DC Orders ED Discharge Orders    None       Marcello Fennel, PA-C 09/07/20 1613    Tegeler, Gwenyth Allegra, MD 09/10/20 1350

## 2020-09-07 NOTE — ED Notes (Signed)
Pt found to be walking out of the ER. Upon assessment patient stated she was leaving the ER. Attempted to educate patient on importance of staying in ER for further eval but patient continued to leave. Provider aware.

## 2020-09-07 NOTE — ED Notes (Addendum)
Pt told registration she left and took her mom to work and came back.

## 2020-09-07 NOTE — ED Notes (Signed)
Vital signs stable. 

## 2020-09-16 ENCOUNTER — Encounter (HOSPITAL_COMMUNITY): Payer: Self-pay | Admitting: Obstetrics & Gynecology

## 2020-09-16 ENCOUNTER — Inpatient Hospital Stay (HOSPITAL_COMMUNITY): Payer: Self-pay

## 2020-09-16 ENCOUNTER — Other Ambulatory Visit: Payer: Self-pay

## 2020-09-16 ENCOUNTER — Inpatient Hospital Stay (HOSPITAL_COMMUNITY)
Admission: EM | Admit: 2020-09-16 | Discharge: 2020-09-16 | Disposition: A | Discharge status: Home / Self Care | Payer: Self-pay | Attending: Obstetrics & Gynecology | Admitting: Obstetrics & Gynecology

## 2020-09-16 DIAGNOSIS — O99331 Smoking (tobacco) complicating pregnancy, first trimester: Secondary | ICD-10-CM | POA: Insufficient documentation

## 2020-09-16 DIAGNOSIS — R1012 Left upper quadrant pain: Secondary | ICD-10-CM | POA: Insufficient documentation

## 2020-09-16 DIAGNOSIS — O99322 Drug use complicating pregnancy, second trimester: Secondary | ICD-10-CM | POA: Insufficient documentation

## 2020-09-16 DIAGNOSIS — Z3491 Encounter for supervision of normal pregnancy, unspecified, first trimester: Secondary | ICD-10-CM

## 2020-09-16 DIAGNOSIS — F17210 Nicotine dependence, cigarettes, uncomplicated: Secondary | ICD-10-CM | POA: Insufficient documentation

## 2020-09-16 DIAGNOSIS — Z3A01 Less than 8 weeks gestation of pregnancy: Secondary | ICD-10-CM | POA: Insufficient documentation

## 2020-09-16 DIAGNOSIS — O26891 Other specified pregnancy related conditions, first trimester: Secondary | ICD-10-CM

## 2020-09-16 DIAGNOSIS — F1290 Cannabis use, unspecified, uncomplicated: Secondary | ICD-10-CM | POA: Insufficient documentation

## 2020-09-16 DIAGNOSIS — O09212 Supervision of pregnancy with history of pre-term labor, second trimester: Secondary | ICD-10-CM | POA: Insufficient documentation

## 2020-09-16 DIAGNOSIS — R1032 Left lower quadrant pain: Secondary | ICD-10-CM | POA: Insufficient documentation

## 2020-09-16 DIAGNOSIS — O009 Unspecified ectopic pregnancy without intrauterine pregnancy: Secondary | ICD-10-CM | POA: Insufficient documentation

## 2020-09-16 DIAGNOSIS — F1910 Other psychoactive substance abuse, uncomplicated: Secondary | ICD-10-CM | POA: Insufficient documentation

## 2020-09-16 DIAGNOSIS — O99321 Drug use complicating pregnancy, first trimester: Secondary | ICD-10-CM

## 2020-09-16 DIAGNOSIS — F1190 Opioid use, unspecified, uncomplicated: Secondary | ICD-10-CM | POA: Insufficient documentation

## 2020-09-16 DIAGNOSIS — O219 Vomiting of pregnancy, unspecified: Secondary | ICD-10-CM

## 2020-09-16 LAB — CBC
HCT: 33.1 % — ABNORMAL LOW (ref 36.0–46.0)
Hemoglobin: 10 g/dL — ABNORMAL LOW (ref 12.0–15.0)
MCH: 24.4 pg — ABNORMAL LOW (ref 26.0–34.0)
MCHC: 30.2 g/dL (ref 30.0–36.0)
MCV: 80.7 fL (ref 80.0–100.0)
Platelets: 330 10*3/uL (ref 150–400)
RBC: 4.1 MIL/uL (ref 3.87–5.11)
RDW: 16.1 % — ABNORMAL HIGH (ref 11.5–15.5)
WBC: 9.5 10*3/uL (ref 4.0–10.5)
nRBC: 0 % (ref 0.0–0.2)

## 2020-09-16 LAB — URINALYSIS, ROUTINE W REFLEX MICROSCOPIC
Bilirubin Urine: NEGATIVE
Glucose, UA: NEGATIVE mg/dL
Hgb urine dipstick: NEGATIVE
Ketones, ur: NEGATIVE mg/dL
Leukocytes,Ua: NEGATIVE
Nitrite: NEGATIVE
Protein, ur: NEGATIVE mg/dL
Specific Gravity, Urine: 1.018 (ref 1.005–1.030)
pH: 7 (ref 5.0–8.0)

## 2020-09-16 LAB — WET PREP, GENITAL
Clue Cells Wet Prep HPF POC: NONE SEEN
Sperm: NONE SEEN
Trich, Wet Prep: NONE SEEN
Yeast Wet Prep HPF POC: NONE SEEN

## 2020-09-16 LAB — HCG, QUANTITATIVE, PREGNANCY: hCG, Beta Chain, Quant, S: 3083 m[IU]/mL — ABNORMAL HIGH (ref <5)

## 2020-09-16 MED ORDER — PROMETHAZINE HCL 25 MG/ML IJ SOLN
25.0000 mg | Freq: Once | INTRAMUSCULAR | Status: AC
  Administered 2020-09-16: 25 mg via INTRAVENOUS
  Filled 2020-09-16: qty 1

## 2020-09-16 MED ORDER — LACTATED RINGERS IV BOLUS
1000.0000 mL | Freq: Once | INTRAVENOUS | Status: AC
  Administered 2020-09-16: 1000 mL via INTRAVENOUS

## 2020-09-16 MED ORDER — PROMETHAZINE HCL 25 MG PO TABS: 25.0000 mg | ORAL_TABLET | Freq: Four times a day (QID) | ORAL | 0 refills | Status: DC | PRN

## 2020-09-16 NOTE — MAU Provider Note (Addendum)
History   Chief Complaint  Patient presents with   Abdominal Pain    Meghan Bullock is 29 y.o. Female with past medical history of polysubstance abuse, bipolar disorder, preterm delivery, ovarian cyst who presents to the MAU with abdominal pain. Started about 2 weeks ago that comes and goes. Patient was at the emergency room on 09/06/2020 for similar symptoms and was found to be pregnant. She left the E.D. against medical advise.   Patient has been having nausea and vomiting for past 2 weeks and today vomited 7 times. The abdominal pain is getting worse and not improving which brought her into the MAU today. She has tried Aleve with mild improvements in her symptoms. No sick contacts at home or exposures that she is aware of. Patient has used phentynol, IV drugs, and Subutex prior to presentation.   Abdominal Pain This is a new problem. The current episode started 1 to 4 weeks ago. The onset quality is undetermined. The problem occurs intermittently. The problem has been unchanged. The pain is located in the LLQ. The pain is at a severity of 5/10. The pain is mild. The quality of the pain is sharp. The abdominal pain does not radiate. Associated symptoms include a fever, nausea and vomiting. Nothing aggravates the pain. The pain is relieved by nothing. She has tried acetaminophen for the symptoms. Prior diagnostic workup includes ultrasound. There is no history of abdominal surgery or irritable bowel syndrome.     OB History     Gravida  5   Para  2   Term  1   Preterm  1   AB  2   Living  2      SAB  2   TAB      Ectopic      Multiple  0   Live Births  2           Past Medical History:  Diagnosis Date   Anemia    Anxiety    Bipolar 1 disorder ([facility])    Chlamydia    Hepatitis    Heroin abuse ([facility])    Mononucleosis    Ovarian cyst    Polysubstance abuse ([facility])     Past Surgical History:  Procedure Laterality Date   HAND SURGERY     I & D EXTREMITY Left  09/16/2019   Procedure: IRRIGATION AND DEBRIDEMENT EXTREMITY;  Surgeon: Ernest Mallick, MD;  [facility];  Service: Orthopedics;  Laterality: Left;    History reviewed. No pertinent family history.  Social History   Tobacco Use   Smoking status: Current Every Day Smoker    Packs/day: 1.50    Years: 5.00    Pack years: 7.50    Types: Cigarettes   Smokeless tobacco: Never Used  Building services engineer Use: Never used  Substance Use Topics   Alcohol use: No   Drug use: Yes    Types: Heroin, Marijuana    Comment:  heroin      Allergies:  Allergies  Allergen Reactions   Acetaminophen Itching    Medications Prior to Admission  Medication Sig Dispense Refill Last Dose   linezolid (ZYVOX) 600 MG tablet Take 1 tablet (600 mg total) by mouth 2 (two) times daily. 28 tablet 0     Review of Systems  Constitutional: Positive for chills and fever.  HENT: Negative.   Eyes: Negative.   Respiratory: Negative.   Cardiovascular: Negative.   Gastrointestinal: Positive for abdominal pain, nausea  and vomiting.  Genitourinary: Negative.   Musculoskeletal: Negative.   Skin: Negative.   Neurological: Negative.   Psychiatric/Behavioral: Positive for substance abuse.    Review of Systems  Constitutional: Positive for chills and fever.  HENT: Negative.   Eyes: Negative.   Respiratory: Negative.   Cardiovascular: Negative.   Gastrointestinal: Positive for abdominal pain, nausea and vomiting.  Genitourinary: Negative.   Musculoskeletal: Negative.   Skin: Negative.   Neurological: Negative.   Endo/Heme/Allergies: Negative.   Psychiatric/Behavioral: Positive for substance abuse.   Physical Exam Blood pressure 126/72, pulse 86, temperature 98 F (36.7 C), temperature source Oral, resp. rate 16, height 5\' 4"  (1.626 m), weight 76 kg, last menstrual period 08/08/2020, SpO2 100 %. Physical Exam Nursing note reviewed.  Constitutional:      General: She is not in acute distress.     Comments: somnolent  HENT:     Head: Normocephalic and atraumatic.     Mouth/Throat:     Mouth: Mucous membranes are moist.     Pharynx: Oropharynx is clear.  Cardiovascular:     Rate and Rhythm: Normal rate and regular rhythm.     Heart sounds: No murmur heard.  No friction rub. No gallop.   Pulmonary:     Effort: Pulmonary effort is normal.     Breath sounds: Normal breath sounds.  Abdominal:     General: Abdomen is flat.     Palpations: There is no shifting dullness, hepatomegaly or mass.     Tenderness: There is abdominal tenderness in the left upper quadrant and left lower quadrant.     Hernia: No hernia is present.  Skin:    General: Skin is warm.  Neurological:     General: No focal deficit present.     MAU Course Procedures   Results for orders placed or performed during the hospital encounter of 09/16/20 (from the past 24 hour(s))  Urinalysis, Routine w reflex microscopic     Status: Abnormal   Collection Time: 09/16/20  8:23 AM  Result Value Ref Range   Color, Urine YELLOW YELLOW   APPearance HAZY (A) CLEAR   Specific Gravity, Urine 1.018 1.005 - 1.030   pH 7.0 5.0 - 8.0   Glucose, UA NEGATIVE NEGATIVE mg/dL   Hgb urine dipstick NEGATIVE NEGATIVE   Bilirubin Urine NEGATIVE NEGATIVE   Ketones, ur NEGATIVE NEGATIVE mg/dL   Protein, ur NEGATIVE NEGATIVE mg/dL   Nitrite NEGATIVE NEGATIVE   Leukocytes,Ua NEGATIVE NEGATIVE    .09/18/20 OB LESS THAN 14 WEEKS WITH OB TRANSVAGINAL  Result Date: 09/16/2020 CLINICAL DATA:  Lower abdominal pain EXAM: OBSTETRIC <14 WK 09/18/2020 AND TRANSVAGINAL OB US TECHNIQUE: Both transabdominal and transvaginal ultrasound examinations were performed for complete evaluation of the gestation as well as the maternal uterus, adnexal regions, and pelvic cul-de-sac. Transvaginal technique was performed to assess early pregnancy. COMPARISON:  None. FINDINGS: Intrauterine gestational sac: Single Yolk sac:  Possible. Embryo:  Not Visualized. Cardiac  Activity: Not Visualized. MSD: 6.8 mm   5 w   2 d Subchorionic hemorrhage:  None visualized. Maternal uterus/adnexae: Patient vomiting during study per technologist note. Right ovary unremarkable. Left ovary incompletely evaluated. No free fluid. IMPRESSION: Probable early intrauterine gestational sac with possible yolk sac. No fetal pole or cardiac activity yet visualized. Recommend follow-up quantitative B-HCG levels and follow-up US in 14 days to assess viability. Electronically Signed   By: [place] M.D.   On: 09/16/2020 11:41     MDM I have personally reviewed all  imaging, labs and have interpreted them.  Concerns for infection   - CBC for leukocytosis  - Vitals afebrile  - UA is clean negative for pyelo or UTI   Ectopic pregnancy - u/s abdominal and transvaginal  - beta HCG  Nausea/Vomiting - Zofran injection - IV LR bolus   Abdominal Pain Tylenol   Polysubstance abuse  - Patient is interested in decreasing substance use. Outpatient clinic with provider who can manage suboxone.     Assessment and Plan  Nausea vomiting  - start Phenergan tablet for symptom control   U/S  -Probable early intrauterine gestational sac with possible yolk sac. -Outpatient US OB transvaginal for viability   Discharge -Discharge home in stable condition -Rx for Phenergan and linezolid -No precautions discussed -Patient advised to follow-up with PCP -Patient may return to MAU as needed or if her condition were to change or worsen       I confirm that I have verified and agree with the information documented in the medical student's note.   Please see my note for full documentation of this encounter.  Judeth Horn, NP 09/16/2020 6:28 PM

## 2020-09-16 NOTE — ED Triage Notes (Signed)
Pt arrives to ED with c/o of lower abd off and on for over 1 week, was last seen here on 10/10 and had pregnancy confirmed at that time. G4P2 Denies any vaginal bleeding, LMP was sometime in august unsure date. No prenatal care at this time.

## 2020-09-16 NOTE — ED Notes (Addendum)
Report called to [facility] in MAU after MSE by [place], [place]

## 2020-09-16 NOTE — MAU Provider Note (Signed)
History     CSN: [id number]  Arrival date and time: 09/16/20 8099   First Provider Initiated Contact with Patient 09/16/20 0932      Chief Complaint  Patient presents with  . Abdominal Pain   Meghan Bullock is a 29 y.o. [id number] at [redacted]w[redacted]d who presents with n/v & abdominal cramping. Symptoms started about 2 weeks ago. Reports intermittent lower abdominal cramping for the last 2 weeks. Went to the ED last week for these symptoms but left AMA prior to being seen by a provider. Had a HCG of 183 on that day. Denies vaginal bleeding, vaginal discharge, or dysuria.  Has been having n/v for the last few weeks. Reports vomiting 7 times today. Had some loose stools 2 days ago but none since.  History of heroin abuse. Last use was yesterday. States she's in the process of getting on suboxone.   Location: abdomen Quality: cramping Severity: 6/10 on pain scale Duration: 2 weeks Timing: intermittent Modifying factors: none Associated signs and symptoms: n/v      OB History    Gravida  5   Para  2   Term  1   Preterm  1   AB  2   Living  2     SAB  2   TAB      Ectopic      Multiple  0   Live Births  2           Past Medical History:  Diagnosis Date  . Anemia   . Anxiety   . Bipolar 1 disorder (HCC)   . Chlamydia   . Hepatitis   . Heroin abuse (HCC)   . Mononucleosis   . Ovarian cyst   . Polysubstance abuse [facility])     Past Surgical History:  Procedure Laterality Date  . HAND SURGERY    . I & D EXTREMITY Left 09/16/2019   Procedure: IRRIGATION AND DEBRIDEMENT EXTREMITY;  Surgeon: Ernest Mallick, MD;  Location: [facility] OR;  Service: Orthopedics;  Laterality: Left;    History reviewed. No pertinent family history.  Social History   Tobacco Use  . Smoking status: Current Every Day Smoker    Packs/day: 1.50    Years: 5.00    Pack years: 7.50    Types: Cigarettes  . Smokeless tobacco: Never Used  Vaping Use  . Vaping Use: Never used  Substance  Use Topics  . Alcohol use: No  . Drug use: Yes    Types: Heroin, Marijuana    Comment:  heroin      Allergies:  Allergies  Allergen Reactions  . Acetaminophen Itching    Medications Prior to Admission  Medication Sig Dispense Refill Last Dose  . linezolid (ZYVOX) 600 MG tablet Take 1 tablet (600 mg total) by mouth 2 (two) times daily. 28 tablet 0     Review of Systems  Constitutional: Negative.   Gastrointestinal: Positive for abdominal pain, nausea and vomiting. Negative for constipation and diarrhea.  Genitourinary: Negative.    Physical Exam   Blood pressure 126/72, pulse 86, temperature 98 F (36.7 C), temperature source Oral, resp. rate 16, height 5\' 4"  (1.626 m), weight 76 kg, last menstrual period 08/08/2020, SpO2 100 %.  Physical Exam Vitals and nursing note reviewed.  Constitutional:      General: She is sleeping.  HENT:     Head: Normocephalic and atraumatic.  Pulmonary:     Effort: Pulmonary effort is normal. No respiratory distress.  Abdominal:  General: Abdomen is flat.     Tenderness: There is no abdominal tenderness.  Skin:    General: Skin is warm and dry.     Comments: Bruising noted on bilateral forearms  Neurological:     Mental Status: She is easily aroused.  Psychiatric:        Mood and Affect: Mood and affect normal.        Speech: Speech normal.     MAU Course  Procedures Results for orders placed or performed during the hospital encounter of 09/16/20 (from the past 24 hour(s))  Urinalysis, Routine w reflex microscopic     Status: Abnormal   Collection Time: 09/16/20  8:23 AM  Result Value Ref Range   Color, Urine YELLOW YELLOW   APPearance HAZY (A) CLEAR   Specific Gravity, Urine 1.018 1.005 - 1.030   pH 7.0 5.0 - 8.0   Glucose, UA NEGATIVE NEGATIVE mg/dL   Hgb urine dipstick NEGATIVE NEGATIVE   Bilirubin Urine NEGATIVE NEGATIVE   Ketones, ur NEGATIVE NEGATIVE mg/dL   Protein, ur NEGATIVE NEGATIVE mg/dL   Nitrite NEGATIVE  NEGATIVE   Leukocytes,Ua NEGATIVE NEGATIVE  CBC     Status: Abnormal   Collection Time: 09/16/20 10:18 AM  Result Value Ref Range   WBC 9.5 4.0 - 10.5 K/uL   RBC 4.10 3.87 - 5.11 MIL/uL   Hemoglobin 10.0 (L) 12.0 - 15.0 g/dL   HCT 83.2 (L) 36 - 46 %   MCV 80.7 80.0 - 100.0 fL   MCH 24.4 (L) 26.0 - 34.0 pg   MCHC 30.2 30.0 - 36.0 g/dL   RDW 54.9 (H) 82.6 - 41.5 %   Platelets 330 150 - 400 K/uL   nRBC 0.0 0.0 - 0.2 %  hCG, quantitative, pregnancy     Status: Abnormal   Collection Time: 09/16/20 10:18 AM  Result Value Ref Range   hCG, Beta Chain, Quant, S 3,083 (H) <5 mIU/mL  Wet prep, genital     Status: Abnormal   Collection Time: 09/16/20 10:24 AM  Result Value Ref Range   Yeast Wet Prep HPF POC NONE SEEN NONE SEEN   Trich, Wet Prep NONE SEEN NONE SEEN   Clue Cells Wet Prep HPF POC NONE SEEN NONE SEEN   WBC, Wet Prep HPF POC RARE (A) NONE SEEN   Sperm NONE SEEN    US OB LESS THAN 14 WEEKS WITH OB TRANSVAGINAL  Result Date: 09/16/2020 CLINICAL DATA:  Lower abdominal pain EXAM: OBSTETRIC <14 WK [place] AND TRANSVAGINAL OB US TECHNIQUE: Both transabdominal and transvaginal ultrasound examinations were performed for complete evaluation of the gestation as well as the maternal uterus, adnexal regions, and pelvic cul-de-sac. Transvaginal technique was performed to assess early pregnancy. COMPARISON:  None. FINDINGS: Intrauterine gestational sac: Single Yolk sac:  Possible. Embryo:  Not Visualized. Cardiac Activity: Not Visualized. MSD: 6.8 mm   5 w   2 d Subchorionic hemorrhage:  None visualized. Maternal uterus/adnexae: Patient vomiting during study per technologist note. Right ovary unremarkable. Left ovary incompletely evaluated. No free fluid. IMPRESSION: Probable early intrauterine gestational sac with possible yolk sac. No fetal pole or cardiac activity yet visualized. Recommend follow-up quantitative B-HCG levels and follow-up US in 14 days to assess viability. Electronically Signed   By:  Guadlupe Spanish M.D.   On: 09/16/2020 11:41    MDM +UPT UA, wet prep, GC/chlamydia, CBC, ABO/Rh, quant hCG, and [place] today to rule out ectopic pregnancy which can be life threatening.   Ultrasound shows  IUGS with probable yolk sac. Exam difficult to complete as patient was actively vomiting. Reviewed u/s report with patient, ok for follow up viability scan - does not need HCGs.   IV fluids & phenergan given. Patient no longer vomiting. Will rx phenergan  Patient had positive UDS last week for amphetamines, marijuana, & opiates. Last used heroin yesterday. No evidence of withdrawal at this time. Is set up to start suboxone at a treatment center.    Assessment and Plan   1. Abdominal pain during pregnancy in first trimester  -IUGS with YS on ultrasound. Outpatient viability ultrasound ordered for 10 days out -reviewed reasons to return to [facility]U  2. Normal IUP (intrauterine pregnancy) on prenatal ultrasound, first trimester   3. Nausea and vomiting during pregnancy prior to [redacted] weeks gestation  -rx phenergan  4. Drug use affecting pregnancy in first trimester  -continue with suboxone tx     Judeth Horn 09/16/2020, 2:29 PM

## 2020-09-16 NOTE — Discharge Instructions (Signed)
Morning Sickness  Morning sickness is when a woman feels nauseous during pregnancy. This nauseous feeling may or may not come with vomiting. It often occurs in the morning, but it can be a problem at any time of day. Morning sickness is most common during the first trimester. In some cases, it may continue throughout pregnancy. Although morning sickness is unpleasant, it is usually harmless unless the woman develops severe and continual vomiting (hyperemesis gravidarum), a condition that requires more intense treatment. What are the causes? The exact cause of this condition is not known, but it seems to be related to normal hormonal changes that occur in pregnancy. What increases the risk? You are more likely to develop this condition if:  You experienced nausea or vomiting before your pregnancy.  You had morning sickness during a previous pregnancy.  You are pregnant with more than one baby, such as twins. What are the signs or symptoms? Symptoms of this condition include:  Nausea.  Vomiting. How is this diagnosed? This condition is usually diagnosed based on your signs and symptoms. How is this treated? In many cases, treatment is not needed for this condition. Making some changes to what you eat may help to control symptoms. Your health care provider may also prescribe or recommend:  Vitamin B6 supplements.  Anti-nausea medicines.  Ginger. Follow these instructions at home: Medicines  Take over-the-counter and prescription medicines only as told by your health care provider. Do not use any prescription, over-the-counter, or herbal medicines for morning sickness without first talking with your health care provider.  Taking multivitamins before getting pregnant can prevent or decrease the severity of morning sickness in most women. Eating and drinking  Eat a piece of dry toast or crackers before getting out of bed in the morning.  Eat 5 or 6 small meals a day.  Eat dry and  bland foods, such as rice or a baked potato. Foods that are high in carbohydrates are often helpful.  Avoid greasy, fatty, and spicy foods.  Have someone cook for you if the smell of any food causes nausea and vomiting.  If you feel nauseous after taking prenatal vitamins, take the vitamins at night or with a snack.  Snack on protein foods between meals if you are hungry. Nuts, yogurt, and cheese are good options.  Drink fluids throughout the day.  Try ginger ale made with real ginger, ginger tea made from fresh grated ginger, or ginger candies. General instructions  Do not use any products that contain nicotine or tobacco, such as cigarettes and e-cigarettes. If you need help quitting, ask your health care provider.  Get an air purifier to keep the air in your house free of odors.  Get plenty of fresh air.  Try to avoid odors that trigger your nausea.  Consider trying these methods to help relieve symptoms: ? Wearing an acupressure wristband. These wristbands are often worn for seasickness. ? Acupuncture. Contact a health care provider if:  Your home remedies are not working and you need medicine.  You feel dizzy or light-headed.  You are losing weight. Get help right away if:  You have persistent and uncontrolled nausea and vomiting.  You faint.  You have severe pain in your abdomen. Summary  Morning sickness is when a woman feels nauseous during pregnancy. This nauseous feeling may or may not come with vomiting.  Morning sickness is most common during the first trimester.  It often occurs in the morning, but it can be a problem at  any time of day.  In many cases, treatment is not needed for this condition. Making some changes to what you eat may help to control symptoms. This information is not intended to replace advice given to you by your health care provider. Make sure you discuss any questions you have with your health care provider. Document Revised:  10/27/2017 Document Reviewed: 12/17/2016 Elsevier Patient Education  2020 [employer].     Preventing Illegal Drug Use During Pregnancy While you are pregnant, everything that you take into your body affects you and your baby. Illegal drug use during pregnancy is dangerous for your health and your baby's health. Illegal or street drugs are never safe to use during pregnancy. This includes drugs such as:  Cocaine.  MDMA, also called ecstasy.  Amphetamines and methamphetamines.  Heroin.  Marijuana. This may be legal in some states but should not be used during pregnancy. Abusing prescription medicines, such as pain killers, is also a form of illegal drug use. This means taking medicines differently than how they were prescribed, such as in higher doses or for different reasons. This kind of illegal drug use can also be harmful to you and your baby. If you have questions or concerns about your drug or prescription medicine use, work with your health care provider. How can using illegal drugs during pregnancy affect me and my baby? Using illegal drugs or abusing prescription medicines during pregnancy raises your risk for:  Sudden loss of your baby.  Problems with the organ in your womb (uterus) that provides your baby with nutrients, blood, and oxygen (placenta).  Going into labor early (preterm labor), which can put your baby at risk for serious problems.  Using alcohol or other drugs during pregnancy.  Certain infections, including: ? HIV (human immunodeficiency virus). ? Hepatitis. ? STIs (sexually transmitted infections). Illegal drug use can also cause serious problems for your baby, such as:  Being born too early (prematurely).  Breathing problems, heart problems, or other birth defects.  Low birth weight.  An abnormally small head (microcephaly).  Lifelong developmental or learning disabilities. Illegal drug use can lead to dependence or addiction. Dependence or  addiction can cause unpleasant and dangerous symptoms when you stop using the drug (withdrawal). The same can happen to your baby if you use drugs during pregnancy, and your baby may have symptoms of withdrawal after birth (neonatal abstinence syndrome). What are the benefits of avoiding illegal drug use during pregnancy? Not using illegal drugs or abusing prescription medicines is the best way to have a healthy pregnancy and baby. This allows you to:  Be physically and mentally healthier.  Avoid possible legal problems related to drug use.  Give your baby a healthy start in life. What actions can I take?  Do not take any medicine during pregnancy unless it has been prescribed for you to take during your pregnancy.  If you think you may become pregnant, do not use illegal drugs. If you use illegal drugs and have trouble quitting, ask your health care provider for help.  If you are using drugs during pregnancy, get help as soon as possible. Treatment programs during pregnancy are focused on helping you: ? Safely stop illegal drug use. ? Get healthy and stay healthy. ? Have a healthy pregnancy. ? Work with a Chief Technology Officer. Where to find more information You can find more information about preventing illegal drug use during pregnancy from:  March of Dimes: www.marchofdimes.org  American Pregnancy Association: americanpregnancy.[employer]  of Obstetricians and Gynecologists: www.acog.org Contact a health care provider if:   You become pregnant or think you may become pregnant while you are: ? Taking a prescription medicine. ? Using an illegal drug.  You are unable to stop using a drug.  You are pregnant and having withdrawal symptoms. Summary  Illegal drug use during pregnancy can cause serious health problems for you and your baby.  Abusing prescription medicines is a form of illegal drug use that can be just as dangerous as using street drugs.  Tell your  health care provider right away if you get pregnant while taking any drug, including prescription medicines. This information is not intended to replace advice given to you by your health care provider. Make sure you discuss any questions you have with your health care provider. Document Revised: 08/09/2019 Document Reviewed: 10/10/2017 Elsevier Patient Education  2020 [employer].     Opioid Services in [place] Area EMERGENCY: 911  OPIOID DETOX (and treatment)  See below for opioid-specific detox and treatment programs, and note that area hospitals and emergency departments have LIMITED opioid-detox availability, often only for medical complications. For pregnant patients, only [facility] and [facility].  [facility]--Call [phone] for initial phone assessment, accept most insurance; Medicaid for treatment only(not detox, but will refer Medicaid patients out for detox), ShowDirectories.co.za for information. [address], [place], [place] [address]  [facility] - (Ask your medical provider for a referral, they will not accept self-referrals, they do have program for pregnant patients), located in [place], [place]  [facility] [place]- Call [phone] accepts both Medicaid and the uninsured www.freedomhouserecovery.org   [facility] - For detox, come in to emergency department at [address], [place], [place] [address]. You will need to be medically cleared first. Please bring all of your medications with you. They do accept pregnant patients for opioid detox.   [facility]- Call [phone] for more information about detox program and initial phone assessment, they accept Medicaid and uninsured only, no pregnancy detox ------------------------------------------------------  MEDICAID patients: Call [facility] at 857-397-7619 or www.sandhillscenter.[address] , [address] [place], [place] [address] to find providers who accept Medicaid    Other insurance: Freight forwarder company (number often on back of insurance card) to find out about providers in your area who take your insurance ------------------------------------------------------  HARM REDUCTION and PREVENTION:   Naloxone- See [facility] below, to obtain Naloxone (for overdose reversals)   [employer] AIDS training and Education Center([employer], [place])  BestTheory.uy is the state-wide HIV/STI/hepatitis C training center for health professionals and advocates in the state of [place]. Professionals interested in training on diseases related to the opioid epidemic should contact the training center.   [facility]/Project Adair Patter Pollock 419-796-6582 or tbennett@[email or web address] or [email or web address]  [address] [place], [place] [address] Prevention, NOT treatment *Check [facility] [place] or Project Lazarus of [place] on facebook  Mother-to-Baby [place]: Medications and More during Pregnancy and Breastfeeding- Questions about medications and substances during pregnancy and while breastfeeding, call 218-295-6433 or text questions to 256-035-3457 https://mothertobaby.org  METHADONE, SUBOXONE  [facility] of [employer] (ADS) [place] 580 054 7800 or www.adsyes.com [address] [place], [place] [address] * Methadone Program: call 502-798-9266, extension 237, Talk to [facility] for an appointment * Non-opioid walk-in on Mondays, Tuesdays, Fridays, 1:30-3:00pm, or call to make an appointment  [facility] [phone]  [address], [place], [place] * Methadone, $81  per day/ Suboxone, $125 per week self-pay (do take [facility] Medicaid) * First visit walk-in between 5:30am-8:30am, Monday through Friday, with picture ID and Medicaid card(if you have one)  Jovita Kussmaulvans [place] [phone]) [phone]  [address], [place], [place] [address]  *Medicaid  accepted  [facility] [phone] to set up appointment [phone] or www.newseason.com [phone](fax) [address], [address], [place], [place] [address] * Methadone, $14 per day/ Buprenorphine, $19 per day/ Suboxone, $24 per day *Bring photo ID, must have opiates in system and at least one year of usage prior to admittance to program  [facility] [phone]) 4451480542 (Ask to speak to Jaci Carrelose Marie) or www.[facility].com  [address], [place], [place] [address] *Accepts most insurance and Medicaid *Locations also in [place], [place], [place] *Some Suboxone treatment  [facility] [phone]) [phone]  [address], [place], [place] [address] *Call ahead for Suboxone appointment  TREATMENT FACILITIES (No methadone or suboxone) [facility] [phone] or www.caringservices.org [address], [place], [place] [address] *Naloxone Distribution  *Outpatient and Transitional Housing  [facility] [phone] [address], [place], [place] [address] *Must detox first (see detox locations above) *Requirements: Be uninsured OR have Medicaid, be a [place] resident, and stay a minimum 30 days for inpatient treatment  [facility] [phone]) [phone] or www.[facility]ship[facility].com  [address], [place], [place] [address] *Detox, residential, partial hospitalization, intensive outpatient(day/evening), extended treatment, transitional housing, sober living *Call for Northwest Airlinesinsurance requirements, no Borg[employer]nerMedicaid   [facility] [phone] or www.legacyfreedom.com [address], [place], [place] [address] *Call [phone]) [phone] after hours, or for information about locations in [place], [place], [place], and [place], [place] *No Medicaid or [employer] EntertainmentMedicare, limited scholarships available  * NOT a 12-step program  [facility]  A supportive  community for homeless women in recovery from substance abuse (336) (607) 794-2653 or email at maryshousegso@aol .com for more information www.maryshousegso.org   [facility] [phone])[phone] or www.[facility].com  [address], [place], [place], [address] Sarita Bottomarol L Sena, MD and Delphia GratesBlair Krouse, NP *detox up to 3 months, most insurance and Medicaid  [facility] [phone] or www.roominn.org  *Comprehensive program helping homeless, single, pregnant women (with or without previous children), during pregnancy and after birth  *Must be a [place] resident, at least [redacted] weeks pregnant, with no severe psychiatric history   INDIVIDUAL TREATMENT PROVIDERS Dr. Iver Nestleoy Dewayne Book, [address], [place], [place] [address], [phone]) [phone] Dr. Dennard SchaumannJeremy Don Harrison, [address], [place], [place] [address] [phone] Dr. Vickey Hugerichard Michael Pavelock, [address], [place], [place] [address] [phone] Dr Lawerance Bachupinder Stevan Bornhami Kaur, [address], [place], [place] [address], [phone] Dr. Roger ShelterMoheed A. Jannifer Franklinkintayo, [address], [address], [place], [place] [address] [phone] Dr. Marcy SalvoJerome Irvin Davis, [address], [place], [place] [address]  [phone]) [phone] Dr. Fredna DowMasoud S. Wynonia LawmanHejazi, [address], [place], [place] [address], [phone]) [phone]  Dr. Wendi MayaBrenda Fagan Harris, [address], [address], [place], [place] [address], [phone] Mat Sandiford, MSW,LCSW,LCAS, [address], [place], [place] [address] [phone], Private pay only                                                         SUPPORT GROUPS  Overdose  Loss Support Group (336) I1640051 or www.hospicegso.org *Contact Kimberly Grove(937 249 7813 or kgrove@hospicegso .org) or Mary Easton(226-494-8015 or measton@hospicegso .org) for date, time, location, and more information about support group  [facility] 406-315-9519 or FraudPod.com.pt [address], [place], [place] [address] *Peer  support for students and collegiate recovery community, NO TREATMENT, SUPPORT ONLY FOR STUDENTS AND FUTURE STUDENTS  **All information above is for informational purposes only, and is subject to change, please contact agencies and/or providers to find out any updated information      [facility] for [facility] at [facility]  [address], [place], [place] [address]  [phone]  Center for [facility] Healthcare at [facility]  [address], [place], [place] [address]  [phone]  Center for [facility] at [facility] [address] [place], [place] [address]  [phone]  Center for [facility] Healthcare at [facility]  [address] [place], [place] [address]  [phone]  Center for [facility] at [facility]  [address] (First floor), [place], [place] [address]  [phone]  Center for [facility] [address], [place], [place] [address] [phone]  Center for [facility] at [facility]  [address], [place], [place] [address]  [phone]  [facility]  [address], [place], [place] [address]  [phone]  [facility]  [address] [place], [place], [place] [address]  [phone]  [facility]  [address] [place], [place] [address]  [phone]  [facility]  [address] [place], [place] [address]  [phone]  [facility]  [address], [place], [place] [address]  [phone]  [facility]   [address] [place], [place] [address]  [phone]  [facility]  [address], [place], [place] [address]   [phone]  [facility]  [address], [place], [place] [address]  [phone]

## 2020-09-16 NOTE — MAU Note (Signed)
Pain in lower abd, off and on for weeks.  Cramping. No bleeding. Having nausea- mostly, diarrhea not so often (last was 2 days ago). Denies urinary issues.

## 2020-09-17 LAB — GC/CHLAMYDIA PROBE AMP (~~LOC~~) NOT AT ARMC
Chlamydia: NEGATIVE
Comment: NEGATIVE
Comment: NORMAL
Neisseria Gonorrhea: NEGATIVE

## 2020-09-29 ENCOUNTER — Emergency Department (HOSPITAL_COMMUNITY)
Admission: EM | Admit: 2020-09-29 | Discharge: 2020-09-29 | Disposition: A | Discharge status: Home / Self Care | Payer: Medicaid Other | Attending: Emergency Medicine | Admitting: Emergency Medicine

## 2020-09-29 ENCOUNTER — Other Ambulatory Visit: Payer: Self-pay

## 2020-09-29 ENCOUNTER — Encounter (HOSPITAL_COMMUNITY): Payer: Self-pay

## 2020-09-29 DIAGNOSIS — L0291 Cutaneous abscess, unspecified: Secondary | ICD-10-CM

## 2020-09-29 DIAGNOSIS — R21 Rash and other nonspecific skin eruption: Secondary | ICD-10-CM | POA: Diagnosis present

## 2020-09-29 DIAGNOSIS — Z3A00 Weeks of gestation of pregnancy not specified: Secondary | ICD-10-CM | POA: Diagnosis not present

## 2020-09-29 DIAGNOSIS — O99719 Diseases of the skin and subcutaneous tissue complicating pregnancy, unspecified trimester: Secondary | ICD-10-CM | POA: Insufficient documentation

## 2020-09-29 DIAGNOSIS — F17210 Nicotine dependence, cigarettes, uncomplicated: Secondary | ICD-10-CM | POA: Insufficient documentation

## 2020-09-29 DIAGNOSIS — T402X5A Adverse effect of other opioids, initial encounter: Secondary | ICD-10-CM | POA: Insufficient documentation

## 2020-09-29 DIAGNOSIS — L02413 Cutaneous abscess of right upper limb: Secondary | ICD-10-CM | POA: Diagnosis not present

## 2020-09-29 DIAGNOSIS — F1990 Other psychoactive substance use, unspecified, uncomplicated: Secondary | ICD-10-CM | POA: Diagnosis not present

## 2020-09-29 DIAGNOSIS — B9689 Other specified bacterial agents as the cause of diseases classified elsewhere: Secondary | ICD-10-CM | POA: Insufficient documentation

## 2020-09-29 DIAGNOSIS — O9A219 Injury, poisoning and certain other consequences of external causes complicating pregnancy, unspecified trimester: Secondary | ICD-10-CM | POA: Insufficient documentation

## 2020-09-29 DIAGNOSIS — T7840XA Allergy, unspecified, initial encounter: Secondary | ICD-10-CM

## 2020-09-29 LAB — COMPREHENSIVE METABOLIC PANEL
ALT: 13 U/L (ref 0–44)
AST: 19 U/L (ref 15–41)
Albumin: 4 g/dL (ref 3.5–5.0)
Alkaline Phosphatase: 82 U/L (ref 38–126)
Anion gap: 13 (ref 5–15)
BUN: 13 mg/dL (ref 6–20)
CO2: 24 mmol/L (ref 22–32)
Calcium: 9.5 mg/dL (ref 8.9–10.3)
Chloride: 96 mmol/L — ABNORMAL LOW (ref 98–111)
Creatinine, Ser: 0.55 mg/dL (ref 0.44–1.00)
GFR, Estimated: >60 mL/min (ref >60)
Glucose, Bld: 106 mg/dL — ABNORMAL HIGH (ref 70–99)
Potassium: 3.4 mmol/L — ABNORMAL LOW (ref 3.5–5.1)
Sodium: 133 mmol/L — ABNORMAL LOW (ref 135–145)
Total Bilirubin: 0.6 mg/dL (ref 0.3–1.2)
Total Protein: 8.3 g/dL — ABNORMAL HIGH (ref 6.5–8.1)

## 2020-09-29 LAB — CBC
HCT: 36.6 % (ref 36.0–46.0)
Hemoglobin: 11.3 g/dL — ABNORMAL LOW (ref 12.0–15.0)
MCH: 24.6 pg — ABNORMAL LOW (ref 26.0–34.0)
MCHC: 30.9 g/dL (ref 30.0–36.0)
MCV: 79.6 fL — ABNORMAL LOW (ref 80.0–100.0)
Platelets: 408 10*3/uL — ABNORMAL HIGH (ref 150–400)
RBC: 4.6 MIL/uL (ref 3.87–5.11)
RDW: 15.4 % (ref 11.5–15.5)
WBC: 9.9 10*3/uL (ref 4.0–10.5)
nRBC: 0 % (ref 0.0–0.2)

## 2020-09-29 MED ORDER — FAMOTIDINE IN NACL 20-0.9 MG/50ML-% IV SOLN
20.0000 mg | Freq: Once | INTRAVENOUS | Status: AC
  Administered 2020-09-29: 20 mg via INTRAVENOUS
  Filled 2020-09-29: qty 50

## 2020-09-29 MED ORDER — PREDNISONE 20 MG PO TABS: 40.0000 mg | ORAL_TABLET | Freq: Every day | ORAL | 0 refills | Status: DC

## 2020-09-29 MED ORDER — DIPHENHYDRAMINE HCL 50 MG/ML IJ SOLN
50.0000 mg | Freq: Once | INTRAMUSCULAR | Status: AC
  Administered 2020-09-29: 50 mg via INTRAVENOUS
  Filled 2020-09-29: qty 1

## 2020-09-29 MED ORDER — EPINEPHRINE 0.3 MG/0.3ML IJ SOAJ: 0.3000 mg | INTRAMUSCULAR | 0 refills | Status: DC | PRN

## 2020-09-29 MED ORDER — LIDOCAINE HCL (PF) 1 % IJ SOLN
30.0000 mL | Freq: Once | INTRAMUSCULAR | Status: AC
  Administered 2020-09-29: 30 mL
  Filled 2020-09-29: qty 30

## 2020-09-29 MED ORDER — CEPHALEXIN 500 MG PO CAPS: 500.0000 mg | ORAL_CAPSULE | Freq: Four times a day (QID) | ORAL | 0 refills | Status: AC

## 2020-09-29 MED ORDER — SODIUM CHLORIDE 0.9 % IV BOLUS
1000.0000 mL | Freq: Once | INTRAVENOUS | Status: AC
  Administered 2020-09-29: 1000 mL via INTRAVENOUS

## 2020-09-29 MED ORDER — METHYLPREDNISOLONE SODIUM SUCC 125 MG IJ SOLR
125.0000 mg | Freq: Once | INTRAMUSCULAR | Status: AC
  Administered 2020-09-29: 125 mg via INTRAVENOUS
  Filled 2020-09-29: qty 2

## 2020-09-29 NOTE — ED Triage Notes (Signed)
Pt presents with c/o rash on her body, pt unsure of where it is coming from. Pt reports itching all over her body. Pt reports she just left jail 2 nights ago. Pt also reports she recently found out she was pregnant, unsure of how far along she is.

## 2020-09-29 NOTE — Discharge Instructions (Addendum)
You were evaluated in the Emergency Department and after careful evaluation, we did not find any emergent condition requiring admission or further testing in the hospital.  Your exam/testing today was overall reassuring.  Symptoms seem to be due to an allergic reaction.  We recommend continued Benadryl every 4-6 hours for itching or rash.  We also recommend the prednisone medication starting tomorrow as directed.  If you experience severe worsening facial/lip/throat swelling we recommend use of the EpiPen provided.  We discussed management options of your abscess on your arm.  Please use warm compresses to try expel the pus and please take the antibiotics as directed.  We recommend follow-up with an obstetrics doctor given your pregnancy.  We recommend following up with outpatient counseling or substance abuse resources provided.  Please return to the Emergency Department if you experience any worsening of your condition.  Thank you for allowing [place] to be a part of your care.

## 2020-09-29 NOTE — ED Notes (Signed)
In patient's room to remove IV for discharge.  After completing asked patient if there was anything else she needed.  Patient stated that her lips were now swollen.  Bero, EDP made aware and in room to assess patient.  Patient has been eating and drink food/drink she bought in with her.  Funyuns and gushers are all over the floor and bed.

## 2020-09-29 NOTE — ED Provider Notes (Addendum)
[facility]ERGENCY DEPT [facility] Emergency Department Provider Note MRN:  [id number]  Arrival date & time: 09/29/20     Chief Complaint   Rash   History of Present Illness   Meghan Bullock is a 29 y.o. year-old female with a history of bipolar disorder, heroin abuse presenting to the ED with chief complaint of rash.  Patient was sober for 6 months but recently relapsed, tried a new drug bender last night, was told that the substance contained fentanyl. Over the night she began experiencing diffuse itching and rash to her whole body. Denies sores in the mouth, no nausea vomiting, no trouble breathing, no sensation of throat closure. No other new exposures or foods or soaps. Also endorsing a abscess to her right wrist present for the past 3 to 4 days, painful to the touch.  Review of Systems  A complete 10 system review of systems was obtained and all systems are negative except as noted in the HPI and PMH.   Patient's Health History    Past Medical History:  Diagnosis Date  . Anemia   . Anxiety   . Bipolar 1 disorder (HCC)   . Chlamydia   . Hepatitis   . Heroin abuse (HCC)   . Mononucleosis   . Ovarian cyst   . Polysubstance abuse [facility])     Past Surgical History:  Procedure Laterality Date  . HAND SURGERY    . I & D EXTREMITY Left 09/16/2019   Procedure: IRRIGATION AND DEBRIDEMENT EXTREMITY;  Surgeon: Ernest Mallick, MD;  Location: [facility] OR;  Service: Orthopedics;  Laterality: Left;    History reviewed. No pertinent family history.  Social History   Socioeconomic History  . Marital status: Legally Separated    Spouse name: Not on file  . Number of children: Not on file  . Years of education: Not on file  . Highest education level: Not on file  Occupational History  . Not on file  Tobacco Use  . Smoking status: Current Every Day Smoker    Packs/day: 1.50    Years: 5.00    Pack years: 7.50    Types: Cigarettes  . Smokeless tobacco: Never Used  Vaping Use    . Vaping Use: Never used  Substance and Sexual Activity  . Alcohol use: No  . Drug use: Yes    Types: Heroin, Marijuana    Comment:  heroin    . Sexual activity: Yes    Birth control/protection: None  Other Topics Concern  . Not on file  Social History Narrative  . Not on file   Social Determinants of Health   Financial Resource Strain:   . Difficulty of Paying Living Expenses: Not on file  Food Insecurity:   . Worried About Programme researcher, broadcasting/film/video in the Last Year: Not on file  . Ran Out of Food in the Last Year: Not on file  Transportation Needs:   . Lack of Transportation (Medical): Not on file  . Lack of Transportation (Non-Medical): Not on file  Physical Activity:   . Days of Exercise per Week: Not on file  . Minutes of Exercise per Session: Not on file  Stress:   . Feeling of Stress : Not on file  Social Connections:   . Frequency of Communication with Friends and Family: Not on file  . Frequency of Social Gatherings with Friends and Family: Not on file  . Attends Religious Services: Not on file  . Active Member of Clubs or  Organizations: Not on file  . Attends Banker Meetings: Not on file  . Marital Status: Not on file  Intimate Partner Violence:   . Fear of Current or Ex-Partner: Not on file  . Emotionally Abused: Not on file  . Physically Abused: Not on file  . Sexually Abused: Not on file     Physical Exam   Vitals:   09/29/20 1234 09/29/20 1245  BP: (!) 104/54 (!) 101/54  Pulse: 73 78  Resp: 16 16  Temp:    SpO2: 100% 99%    CONSTITUTIONAL: Well-appearing, NAD NEURO:  Alert and oriented x 3, no focal deficits EYES:  eyes equal and reactive ENT/NECK:  no LAD, no JVD CARDIO: Regular rate, well-perfused, normal S1 and S2 PULM:  CTAB no wheezing or rhonchi GI/GU:  normal bowel sounds, non-distended, non-tender MSK/SPINE:  No gross deformities, no edema SKIN: Diffuse macular rash to the face, arms, legs, torso, blanching, sparing of the  palms and soles, no mucosal involvement; raised 4 cm fluctuant nodule to the right dorsal wrist PSYCH:  Appropriate speech and behavior  *Additional and/or pertinent findings included in MDM below  Diagnostic and Interventional Summary    EKG Interpretation  Date/Time:    Ventricular Rate:    PR Interval:    QRS Duration:   QT Interval:    QTC Calculation:   R Axis:     Text Interpretation:        Labs Reviewed  CBC - Abnormal; Notable for the following components:      Result Value   Hemoglobin 11.3 (*)    MCV 79.6 (*)    MCH 24.6 (*)    Platelets 408 (*)    All other components within normal limits  COMPREHENSIVE METABOLIC PANEL - Abnormal; Notable for the following components:   Sodium 133 (*)    Potassium 3.4 (*)    Chloride 96 (*)    Glucose, Bld 106 (*)    Total Protein 8.3 (*)    All other components within normal limits    No orders to display    Medications  diphenhydrAMINE (BENADRYL) injection 50 mg (50 mg Intravenous Given 09/29/20 1056)  famotidine (PEPCID) IVPB 20 mg premix (0 mg Intravenous Stopped 09/29/20 1132)  sodium chloride 0.9 % bolus 1,000 mL (0 mLs Intravenous Stopped 09/29/20 1209)  lidocaine (PF) (XYLOCAINE) 1 % injection 30 mL (30 mLs Other Given 09/29/20 1058)     Procedures  /  Critical Care Procedures  ED Course and Medical Decision Making  I have reviewed the triage vital signs, the nursing notes, and pertinent available records from the EMR.  Listed above are laboratory and imaging tests that I personally ordered, reviewed, and interpreted and then considered in my medical decision making (see below for details).  Suspect rash due to allergic reaction, with only new exposure being new batch of fentanyl. Suspect allergy to contaminant or fentanyl. Also with wrist abscess with surrounding cellulitis which we will drain and treat with Bactrim. Overall patient is with normal vital signs, no signs of sepsis or systemic infection. She denies  SI or HI, she is upset about her relapse and would like some help quitting. She is reportedly pregnant based on recent testing in an emergency department.     Patient's rash is improving with Benadryl and Pepcid, labs overall reassuring, vital signs normal.  She is calm and resting comfortably.  She has deferred I&D procedure today, as she does not feel that the abscess  is ready to be expressed.  I made it clear that I recommended incision and drainage but she wishes to keep an eye on it and trial antibiotics.  Providing Keflex, also providing resources and peers support given her substance use disorder.  2:20 PM update: While awaiting shelter placement, patient began experiencing lower lip swelling.  On my repeat evaluation there is considerable worsening to her lower lip swelling, though her rash is improved.  No airway compromise, normal vital signs.  Given this progression, will keep in the ED for a few more hours, provide second dose of Benadryl and also provide IV Solu-Medrol and monitor closely.  Patient is pregnant but the risks and benefits of Solu-Medrol were considered and given the facial swelling this was thought to be the best course of management.  Overall, if patient's exam is stable after 2 or 3 more hours of observation I feel she is appropriate for discharge with EpiPen.  Signed out to default provider shift change.  Elmer Sow. Pilar Plate, MD [facility] [email or web address]  Final Clinical Impressions(s) / ED Diagnoses     ICD-10-CM   1. Abscess  L02.91   2. Allergic reaction, initial encounter  T78.40XA   3. Rash  R21   4. Substance use disorder  F19.90     ED Discharge Orders         Ordered    cephALEXin (KEFLEX) 500 MG capsule  4 times daily        09/29/20 1313           Discharge Instructions Discussed with and Provided to Patient:       Sabas Sous, MD 09/29/20 1316    Sabas Sous, MD 09/29/20 1426

## 2020-09-30 ENCOUNTER — Ambulatory Visit: Admission: RE | Admit: 2020-09-30 | Payer: Self-pay | Source: Ambulatory Visit

## 2020-10-02 ENCOUNTER — Emergency Department (HOSPITAL_COMMUNITY): Payer: Medicaid Other

## 2020-10-02 ENCOUNTER — Encounter (HOSPITAL_COMMUNITY): Payer: Self-pay

## 2020-10-02 ENCOUNTER — Other Ambulatory Visit: Payer: Self-pay

## 2020-10-02 ENCOUNTER — Emergency Department (HOSPITAL_COMMUNITY)
Admission: EM | Admit: 2020-10-02 | Discharge: 2020-10-02 | Disposition: A | Discharge status: Home / Self Care | Payer: Medicaid Other | Attending: Emergency Medicine | Admitting: Emergency Medicine

## 2020-10-02 DIAGNOSIS — L03113 Cellulitis of right upper limb: Secondary | ICD-10-CM | POA: Diagnosis not present

## 2020-10-02 DIAGNOSIS — L509 Urticaria, unspecified: Secondary | ICD-10-CM | POA: Insufficient documentation

## 2020-10-02 DIAGNOSIS — L02413 Cutaneous abscess of right upper limb: Secondary | ICD-10-CM | POA: Insufficient documentation

## 2020-10-02 DIAGNOSIS — Z20822 Contact with and (suspected) exposure to covid-19: Secondary | ICD-10-CM | POA: Diagnosis not present

## 2020-10-02 DIAGNOSIS — Z3A00 Weeks of gestation of pregnancy not specified: Secondary | ICD-10-CM | POA: Diagnosis not present

## 2020-10-02 DIAGNOSIS — Z3491 Encounter for supervision of normal pregnancy, unspecified, first trimester: Secondary | ICD-10-CM

## 2020-10-02 DIAGNOSIS — L0291 Cutaneous abscess, unspecified: Secondary | ICD-10-CM

## 2020-10-02 DIAGNOSIS — O99711 Diseases of the skin and subcutaneous tissue complicating pregnancy, first trimester: Secondary | ICD-10-CM | POA: Insufficient documentation

## 2020-10-02 DIAGNOSIS — Z3490 Encounter for supervision of normal pregnancy, unspecified, unspecified trimester: Secondary | ICD-10-CM

## 2020-10-02 LAB — RAPID URINE DRUG SCREEN, HOSP PERFORMED
Amphetamines: POSITIVE — AB
Barbiturates: NOT DETECTED
Benzodiazepines: POSITIVE — AB
Cocaine: NOT DETECTED
Opiates: NOT DETECTED
Tetrahydrocannabinol: POSITIVE — AB

## 2020-10-02 LAB — CBC WITH DIFFERENTIAL/PLATELET
Abs Immature Granulocytes: 0.19 10*3/uL — ABNORMAL HIGH (ref 0.00–0.07)
Basophils Absolute: 0 10*3/uL (ref 0.0–0.1)
Basophils Relative: 0 %
Eosinophils Absolute: 0 10*3/uL (ref 0.0–0.5)
Eosinophils Relative: 0 %
HCT: 35 % — ABNORMAL LOW (ref 36.0–46.0)
Hemoglobin: 10.7 g/dL — ABNORMAL LOW (ref 12.0–15.0)
Immature Granulocytes: 1 %
Lymphocytes Relative: 10 %
Lymphs Abs: 1.7 10*3/uL (ref 0.7–4.0)
MCH: 24.2 pg — ABNORMAL LOW (ref 26.0–34.0)
MCHC: 30.6 g/dL (ref 30.0–36.0)
MCV: 79.2 fL — ABNORMAL LOW (ref 80.0–100.0)
Monocytes Absolute: 0.2 10*3/uL (ref 0.1–1.0)
Monocytes Relative: 1 %
Neutro Abs: 14.7 10*3/uL — ABNORMAL HIGH (ref 1.7–7.7)
Neutrophils Relative %: 88 %
Platelets: 429 10*3/uL — ABNORMAL HIGH (ref 150–400)
RBC: 4.42 MIL/uL (ref 3.87–5.11)
RDW: 14.6 % (ref 11.5–15.5)
WBC: 16.8 10*3/uL — ABNORMAL HIGH (ref 4.0–10.5)
nRBC: 0 % (ref 0.0–0.2)

## 2020-10-02 LAB — RESPIRATORY PANEL BY RT PCR (FLU A&B, COVID)
Influenza A by PCR: NEGATIVE
Influenza B by PCR: NEGATIVE
SARS Coronavirus 2 by RT PCR: NEGATIVE

## 2020-10-02 LAB — URINALYSIS, ROUTINE W REFLEX MICROSCOPIC
Bilirubin Urine: NEGATIVE
Glucose, UA: NEGATIVE mg/dL
Hgb urine dipstick: NEGATIVE
Ketones, ur: 5 mg/dL — AB
Leukocytes,Ua: NEGATIVE
Nitrite: NEGATIVE
Protein, ur: 30 mg/dL — AB
Specific Gravity, Urine: 1.032 — ABNORMAL HIGH (ref 1.005–1.030)
pH: 5 (ref 5.0–8.0)

## 2020-10-02 LAB — COMPREHENSIVE METABOLIC PANEL
ALT: 9 U/L (ref 0–44)
AST: 16 U/L (ref 15–41)
Albumin: 3.3 g/dL — ABNORMAL LOW (ref 3.5–5.0)
Alkaline Phosphatase: 64 U/L (ref 38–126)
Anion gap: 11 (ref 5–15)
BUN: 9 mg/dL (ref 6–20)
CO2: 25 mmol/L (ref 22–32)
Calcium: 8.4 mg/dL — ABNORMAL LOW (ref 8.9–10.3)
Chloride: 93 mmol/L — ABNORMAL LOW (ref 98–111)
Creatinine, Ser: 0.63 mg/dL (ref 0.44–1.00)
GFR, Estimated: >60 mL/min (ref >60)
Glucose, Bld: 154 mg/dL — ABNORMAL HIGH (ref 70–99)
Potassium: 3.2 mmol/L — ABNORMAL LOW (ref 3.5–5.1)
Sodium: 129 mmol/L — ABNORMAL LOW (ref 135–145)
Total Bilirubin: 0.6 mg/dL (ref 0.3–1.2)
Total Protein: 7.1 g/dL (ref 6.5–8.1)

## 2020-10-02 LAB — HIV ANTIBODY (ROUTINE TESTING W REFLEX): HIV Screen 4th Generation wRfx: NONREACTIVE

## 2020-10-02 LAB — LACTIC ACID, PLASMA: Lactic Acid, Venous: 1.8 mmol/L (ref 0.5–1.9)

## 2020-10-02 LAB — HCG, QUANTITATIVE, PREGNANCY: hCG, Beta Chain, Quant, S: 44133 m[IU]/mL — ABNORMAL HIGH (ref <5)

## 2020-10-02 MED ORDER — DIPHENHYDRAMINE HCL 50 MG/ML IJ SOLN
50.0000 mg | Freq: Once | INTRAMUSCULAR | Status: AC
  Administered 2020-10-02: 50 mg via INTRAVENOUS
  Filled 2020-10-02: qty 1

## 2020-10-02 MED ORDER — SODIUM CHLORIDE 0.9 % IV BOLUS
1000.0000 mL | Freq: Once | INTRAVENOUS | Status: AC
  Administered 2020-10-02: 1000 mL via INTRAVENOUS

## 2020-10-02 MED ORDER — HYDROXYZINE HCL 25 MG PO TABS: 25.0000 mg | ORAL_TABLET | Freq: Four times a day (QID) | ORAL | 0 refills | Status: DC

## 2020-10-02 MED ORDER — LIDOCAINE-EPINEPHRINE (PF) 2 %-1:200000 IJ SOLN
10.0000 mL | Freq: Once | INTRAMUSCULAR | Status: AC
  Administered 2020-10-02: 10 mL
  Filled 2020-10-02: qty 20

## 2020-10-02 MED ORDER — POTASSIUM CHLORIDE 10 MEQ/100ML IV SOLN
10.0000 meq | Freq: Once | INTRAVENOUS | Status: AC
  Administered 2020-10-02: 10 meq via INTRAVENOUS
  Filled 2020-10-02: qty 100

## 2020-10-02 MED ORDER — METHYLPREDNISOLONE SODIUM SUCC 125 MG IJ SOLR
80.0000 mg | Freq: Once | INTRAMUSCULAR | Status: AC
  Administered 2020-10-02: 80 mg via INTRAVENOUS
  Filled 2020-10-02: qty 2

## 2020-10-02 MED ORDER — LACTATED RINGERS IV BOLUS
1000.0000 mL | Freq: Once | INTRAVENOUS | Status: AC
  Administered 2020-10-02: 1000 mL via INTRAVENOUS

## 2020-10-02 MED ORDER — HYDROXYZINE HCL 25 MG PO TABS
50.0000 mg | ORAL_TABLET | Freq: Once | ORAL | Status: AC
  Administered 2020-10-02: 50 mg via ORAL
  Filled 2020-10-02: qty 2

## 2020-10-02 MED ORDER — FAMOTIDINE IN NACL 20-0.9 MG/50ML-% IV SOLN
20.0000 mg | Freq: Once | INTRAVENOUS | Status: AC
  Administered 2020-10-02: 20 mg via INTRAVENOUS
  Filled 2020-10-02: qty 50

## 2020-10-02 MED ORDER — CLINDAMYCIN HCL 300 MG PO CAPS: 300.0000 mg | ORAL_CAPSULE | Freq: Three times a day (TID) | ORAL | 0 refills | Status: AC

## 2020-10-02 MED ORDER — PREDNISONE 10 MG (21) PO TBPK: ORAL_TABLET | ORAL | 0 refills | Status: DC

## 2020-10-02 MED ORDER — CLINDAMYCIN PHOSPHATE 600 MG/50ML IV SOLN
600.0000 mg | Freq: Once | INTRAVENOUS | Status: AC
  Administered 2020-10-02: 600 mg via INTRAVENOUS
  Filled 2020-10-02: qty 50

## 2020-10-02 NOTE — ED Provider Notes (Signed)
[facility]   CSN: [id number] Arrival date & time: 10/02/20  1518     History Chief Complaint  Patient presents with  . Abscess  . Allergic Reaction    Meghan Bullock is a 29 y.o. female.  HPI      Meghan Bullock is a 29 y.o. female, with a history of IV heroin and fentanyl abuse, pregnancy, anxiety, anemia, bipolar, hepatitis, presenting to the ED with multiple complaints.   Complains of swelling, flunctuance, and erythema to the dorsal right wrist for about the past week.  She states this area coincides with injection site for IV drug use.  Endorses subjective fever and muscle aches.  She was seen in the emergency department November 2 and this issue was evaluated.  I&D was recommended, but patient declined.  She was prescribed Keflex, but states she did not fill this prescription because she could not afford it and is homeless.  Around November 1, she began experiencing diffuse pruritic rash.  She states she had some improvement with treatment given in the emergency department during her visit on November 2, however, she did not fill the prescriptions written for her upon discharge. She denies shortness of breath, throat swelling, chest pain/tightness, diarrhea, dizziness, generalized joint pain.  Patient also states she is pregnant.  She thinks her LMP was November 11. Ultrasound performed on October 20 indicated possible 5-week gestation.  No fetal pole or cardiac activity was visualized at that time, however, they suspected they were seeing an intrauterine gestational sac.  It was recommended for the patient to have repeat hCG quant and ultrasound in 14 days.  Patient has not yet followed up.  Patient states she has had intermittent nausea and vomiting for the past several weeks.  She has been using heroin and fentanyl intravenously on a daily basis for the past several weeks.  Her last use of either was injection of fentanyl early  this morning. She has been COVID vaccinated.  Denies use of other illicit drugs or alcohol. She denies abdominal pain, vaginal bleeding, syncope, or any other complaints.      Past Medical History:  Diagnosis Date  . Anemia   . Anxiety   . Bipolar 1 disorder (HCC)   . Chlamydia   . Hepatitis   . Heroin abuse (HCC)   . Mononucleosis   . Ovarian cyst   . Polysubstance abuse [facility](HCC)     Patient Active Problem List   Diagnosis Date Noted  . Abscess of wrist 03/11/2020  . Substance use disorder 03/11/2020  . Cellulitis and abscess of hand   . 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HCC)   . Normal labor 06/17/2018  . Postpartum care following vaginal delivery 06/17/2018  . Encounter for induction of labor 06/15/2018  . Iron deficiency anemia 04/30/2018  . Supervision of high risk pregnancy, antepartum 10/25/2017  . Substance abuse affecting pregnancy, antepartum 10/25/2017  . H/O preterm delivery, currently pregnant, first trimester 10/25/2017  . Hepatitis C 10/23/2017  . Preterm premature rupture of membranes (PPROM) with unknown onset of labor 05/12/2014  . Encephalopathy, toxic 10/03/2013  . Benzodiazepine abuse (HCC) 10/03/2013  . Heroin abuse [facility](HCC)     Past Surgical History:  Procedure Laterality Date  . HAND SURGERY    . I & D EXTREMITY Left 09/16/2019   Procedure: IRRIGATION AND DEBRIDEMENT EXTREMITY;  Surgeon: Ernest Mallickreighton, James J III, MD;  Location: [facility] OR;  Service: Orthopedics;  Laterality: Left;     OB  History    Gravida  5   Para  2   Term  1   Preterm  1   AB  2   Living  2     SAB  2   TAB      Ectopic      Multiple  0   Live Births  2           History reviewed. No pertinent family history.  Social History   Tobacco Use  . Smoking status: Current Every Day Smoker    Packs/day: 1.50    Years: 5.00    Pack years: 7.50    Types: Cigarettes  . Smokeless tobacco: Never Used  Vaping Use  . Vaping Use: Never used  Substance Use Topics  . Alcohol use:  No  . Drug use: Yes    Types: Heroin, Marijuana    Comment:  heroin      Home Medications Prior to Admission medications   Medication Sig Start Date End Date Taking? Authorizing Provider  cephALEXin (KEFLEX) 500 MG capsule Take 1 capsule (500 mg total) by mouth 4 (four) times daily for 7 days. 09/29/20 10/06/20  Sabas Sous, MD  clindamycin (CLEOCIN) 300 MG capsule Take 1 capsule (300 mg total) by mouth 3 (three) times daily for 7 days. 10/02/20 10/09/20  Takila Kronberg C, PA-C  EPINEPHrine 0.3 mg/0.3 mL IJ SOAJ injection Inject 0.3 mg into the muscle as needed for anaphylaxis. 09/29/20   Sabas Sous, MD  hydrOXYzine (ATARAX/VISTARIL) 25 MG tablet Take 1 tablet (25 mg total) by mouth every 6 (six) hours. 10/02/20   Catheryne Deford C, PA-C  predniSONE (STERAPRED UNI-PAK 21 TAB) 10 MG (21) TBPK tablet Take 6 tabs (60mg ) day 1, 5 tabs (50mg ) day 2, 4 tabs (40mg ) day 3, 3 tabs (30mg ) day 4, 2 tabs (20mg ) day 5, and 1 tab (10mg ) day 6. 10/02/20   Dietrich Samuelson C, PA-C  promethazine (PHENERGAN) 25 MG tablet Take 1 tablet (25 mg total) by mouth every 6 (six) hours as needed for nausea or vomiting. 09/16/20   , NP    Allergies    Acetaminophen  Review of Systems   Review of Systems  Constitutional: Positive for fever.  HENT: Negative for dental problem, sore throat, trouble swallowing and voice change.   Respiratory: Negative for cough, chest tightness and shortness of breath.   Cardiovascular: Negative for chest pain.  Gastrointestinal: Positive for nausea and vomiting. Negative for abdominal pain, blood in stool and diarrhea.  Genitourinary: Negative for dysuria, hematuria, vaginal bleeding and vaginal discharge.  Musculoskeletal: Positive for joint swelling and myalgias.  Skin: Positive for rash.  Neurological: Negative for dizziness, syncope and weakness.  All other systems reviewed and are negative.   Physical Exam Updated Vital Signs BP 140/90 (BP Location: Left Arm)   Pulse  (!) 122   Temp 97.7 F (36.5 C) (Oral)   Resp 16   LMP 08/08/2020   SpO2 99%   Physical Exam Vitals and nursing note reviewed.  Constitutional:      General: She is not in acute distress.    Appearance: She is well-developed. She is not diaphoretic.  HENT:     Head: Normocephalic and atraumatic.     Mouth/Throat:     Mouth: Mucous membranes are moist.     Pharynx: Oropharynx is clear.     Comments: No noted area of intraoral swelling.  No trismus or noted abnormal phonation.  Mouth opening  to at least 3 finger widths.  Handles oral secretions without difficulty.  No noted facial swelling.  No sublingual swelling or tongue elevation.  No swelling or tenderness to the submental or submandibular regions.  No swelling or tenderness into the soft tissues of the neck. Eyes:     Conjunctiva/sclera: Conjunctivae normal.  Cardiovascular:     Rate and Rhythm: Regular rhythm. Tachycardia present.     Pulses: Normal pulses.          Radial pulses are 2+ on the right side and 2+ on the left side.       Posterior tibial pulses are 2+ on the right side and 2+ on the left side.     Heart sounds: Normal heart sounds.     Comments: Tactile temperature in the extremities appropriate and equal bilaterally. Pulmonary:     Effort: Pulmonary effort is normal. No respiratory distress.     Breath sounds: Normal breath sounds.  Abdominal:     Palpations: Abdomen is soft.     Tenderness: There is no abdominal tenderness. There is no guarding.  Musculoskeletal:     Cervical back: Neck supple.     Right lower leg: No edema.     Left lower leg: No edema.     Comments: Tender, erythematous, fluctuant mass to the dorsal right wrist, as shown in the photos.  Surrounding erythema and tenderness.  Pain with attempted movement of the right wrist.   Lymphadenopathy:     Cervical: No cervical adenopathy.  Skin:    General: Skin is warm and dry.     Comments: Diffuse, urticarial type rash across the extremities,  thorax, and neck.  Neurological:     Mental Status: She is alert.  Psychiatric:        Mood and Affect: Mood and affect normal.        Speech: Speech normal.        Behavior: Behavior normal.                    ED Results / Procedures / Treatments   Labs (all labs ordered are listed, but only abnormal results are displayed) Labs Reviewed  COMPREHENSIVE METABOLIC PANEL - Abnormal; Notable for the following components:      Result Value   Sodium 129 (*)    Potassium 3.2 (*)    Chloride 93 (*)    Glucose, Bld 154 (*)    Calcium 8.4 (*)    Albumin 3.3 (*)    All other components within normal limits  CBC WITH DIFFERENTIAL/PLATELET - Abnormal; Notable for the following components:   WBC 16.8 (*)    Hemoglobin 10.7 (*)    HCT 35.0 (*)    MCV 79.2 (*)    MCH 24.2 (*)    Platelets 429 (*)    Neutro Abs 14.7 (*)    Abs Immature Granulocytes 0.19 (*)    All other components within normal limits  URINALYSIS, ROUTINE W REFLEX MICROSCOPIC - Abnormal; Notable for the following components:   Color, Urine AMBER (*)    APPearance HAZY (*)    Specific Gravity, Urine 1.032 (*)    Ketones, ur 5 (*)    Protein, ur 30 (*)    Bacteria, UA FEW (*)    All other components within normal limits  RAPID URINE DRUG SCREEN, HOSP PERFORMED - Abnormal; Notable for the following components:   Benzodiazepines POSITIVE (*)    Amphetamines POSITIVE (*)    Tetrahydrocannabinol  POSITIVE (*)    All other components within normal limits  HCG, QUANTITATIVE, PREGNANCY - Abnormal; Notable for the following components:   hCG, Beta Chain, Quant, S 44,133 (*)    All other components within normal limits  RESPIRATORY PANEL BY RT PCR (FLU A&B, COVID)  CULTURE, BLOOD (ROUTINE X 2)  CULTURE, BLOOD (ROUTINE X 2)  LACTIC ACID, PLASMA  HIV ANTIBODY (ROUTINE TESTING W REFLEX)    EKG None  Radiology DG Wrist Complete Right  Result Date: 10/02/2020 CLINICAL DATA:  Wrist pain swelling tenderness  EXAM: RIGHT WRIST - COMPLETE 3+ VIEW COMPARISON:  None. FINDINGS: There is no evidence of fracture or dislocation. There is no evidence of arthropathy or other focal bone abnormality. Focal soft tissue swelling seen along the dorsal aspect of the wrist. IMPRESSION: No acute osseous abnormality. Focal soft tissue swelling along the dorsal aspect of the wrist. Electronically Signed   By: Jonna Clark M.D.   On: 10/02/2020 17:45   US OB Transvaginal  Result Date: 10/02/2020 CLINICAL DATA:  Discomfort EXAM: TRANSVAGINAL OB ULTRASOUND TECHNIQUE: Transvaginal ultrasound was performed for complete evaluation of the gestation as well as the maternal uterus, adnexal regions, and pelvic cul-de-sac. COMPARISON:  September 16, 2020 FINDINGS: Intrauterine gestational sac: Single Yolk sac:  Visualized. Embryo:  Visualized. Cardiac Activity: Visualized Heart Rate: 160 bpm CRL:   11.0 mm   7 w 2 d                  [place] EDC: 05/19/2021 Subchorionic hemorrhage:  None visualized. Maternal uterus/adnexae: Normal appearing ovaries. IMPRESSION: Single live intrauterine pregnancy measuring 7 weeks 2 days with interval appropriate growth from the prior exam. Electronically Signed   By: Jonna Clark M.D.   On: 10/02/2020 17:43    Procedures Ultrasound ED Soft Tissue  Date/Time: 10/02/2020 5:00 PM Performed by: Anselm Pancoast, PA-C Authorized by: Anselm Pancoast, PA-C   Procedure details:    Indications: localization of abscess and evaluate for cellulitis     Transverse view:  Visualized   Longitudinal view:  Visualized   Images: archived   Location:    Location: upper extremity     Location comment:  Dorsal wrist   Side:  Right Findings:     abscess present    cellulitis present  .Marland KitchenIncision and Drainage  Date/Time: 10/02/2020 6:15 PM Performed by: Anselm Pancoast, PA-C Authorized by: Anselm Pancoast, PA-C   Consent:    Consent obtained:  Verbal   Consent given by:  Patient   Risks discussed:  Bleeding, incomplete  drainage, pain, infection and damage to other organs Location:    Type:  Abscess   Size:  4 cm   Location:  Upper extremity   Upper extremity location:  Wrist   Wrist location:  R wrist Pre-procedure details:    Skin preparation:  Betadine Anesthesia (see MAR for exact dosages):    Anesthesia method:  Local infiltration   Local anesthetic:  Lidocaine 2% WITH epi Procedure type:    Complexity:  Complex Procedure details:    Incision types:  Single straight   Incision depth:  Subcutaneous   Scalpel blade:  11   Wound management:  Probed and deloculated, irrigated with saline and extensive cleaning   Drainage:  Purulent   Drainage amount:  Copious   Wound treatment:  Wound left open Post-procedure details:    Patient tolerance of procedure:  Tolerated well, no immediate complications   (including critical care time)  Medications Ordered in ED Medications  lactated ringers bolus 1,000 mL (0 mLs Intravenous Stopped 10/02/20 1850)  diphenhydrAMINE (BENADRYL) injection 50 mg (50 mg Intravenous Given 10/02/20 1642)  clindamycin (CLEOCIN) IVPB 600 mg (0 mg Intravenous Stopped 10/02/20 1818)  famotidine (PEPCID) IVPB 20 mg premix (0 mg Intravenous Stopped 10/02/20 1850)  lidocaine-EPINEPHrine (XYLOCAINE W/EPI) 2 %-1:200000 (PF) injection 10 mL (10 mLs Infiltration Given by Other 10/02/20 1740)  potassium chloride 10 mEq in 100 mL IVPB (0 mEq Intravenous Stopped 10/02/20 2000)  sodium chloride 0.9 % bolus 1,000 mL (0 mLs Intravenous Stopped 10/02/20 2000)  methylPREDNISolone sodium succinate (SOLU-MEDROL) 125 mg/2 mL injection 80 mg (80 mg Intravenous Given 10/02/20 1858)  hydrOXYzine (ATARAX/VISTARIL) tablet 50 mg (50 mg Oral Given 10/02/20 2107)    ED Course  I have reviewed the triage vital signs and the nursing notes.  Pertinent labs & imaging results that were available during my care of the patient were reviewed by me and considered in my medical decision making (see chart for  details).  Clinical Course as of Oct 03 2235  Fri Oct 02, 2020  1618 Spoke with Jill Alexanders, ED pharmacist for recommendations on medication therapy for her hives/allergic reaction as well as treatment of her cellulitis, all in the setting of pregnancy.   [SJ]  1640 Justin, pharmacist, got back to me with recommendations on medication therapy for this patient. He states we may use Pepcid and can use Solu-Medrol, if needed. For antibiotic therapy for cellulitis in the setting of pregnancy, recommends Rocephin and clindamycin for MRSA coverage.  Recommends avoiding vancomycin.   [SJ]  2019 Spoke with Burna Mortimer, Sports coach. She will work to help the patient obtain her medications in the event she is discharged.  She will facilitate having vouchers [facility] [address].   [SJ]  2046 Spoke with Christiane Ha from social work. He states the peer support person will be in around 11 PM this evening.  He can work on getting patient to a detox program. Patient will need to complete a phone interview with the representative from the [place] detox program.  The in-house physician from the program will also need to clear the patient since she is pregnant.   [SJ]    Clinical Course User Index [SJ] Slevin Gunby, Hillard Danker, PA-C   MDM Rules/Calculators/A&P                          Patient presents with pain to the right wrist.  Tachycardic, but nontoxic-appearing, afebrile, not hypotensive. Evidence of abscess on bedside ultrasound as well as surrounding cellulitis. Abscess drained at bedside. Cellulitis present and will require management with antibiotics.  I suspect this will be appropriate for management with oral antibiotics.  Patient also complains of pruritic rash that appears consistent with urticaria. She was treated for this.  She does not seem to have progression in her symptoms.  No evidence of anaphylaxis.  Patient pregnant at a visit in October.  She did not have recommended  follow-up to confirm IUP.  IUP was confirmed on ultrasound during today's visit.  Findings and plan of care discussed with Alvira Monday, MD. Dr. Dalene Seltzer personally evaluated and examined this patient.  Plan for patient to have a phone interview with staff from [place] detox [facility], to be facilitated by Peer Support after 11pm. [facility] should have openings. If they accept the patient, she will likely go there in the morning. She is otherwise cleared  for discharge.   Case discussed with Fayrene Helper, PA-C, oncoming provider.    Vitals:   10/02/20 2130 10/02/20 2145 10/02/20 2200 10/02/20 2211  BP:    (!) 114/55  Pulse: 97 93 87 88  Resp: 18 (!) Temp:      TempSrc:      SpO2: 100% 99% 98% 99%  Weight:      Height:         Final Clinical Impression(s) / ED Diagnoses Final diagnoses:  Abscess  Cellulitis of right upper extremity  Urticaria  First trimester pregnancy    Rx / DC Orders ED Discharge Orders         Ordered    clindamycin (CLEOCIN) 300 MG capsule  3 times daily        10/02/20 2236    predniSONE (STERAPRED UNI-PAK 21 TAB) 10 MG (21) TBPK tablet        10/02/20 2236    hydrOXYzine (ATARAX/VISTARIL) 25 MG tablet  Every 6 hours        10/02/20 2236           Anselm Pancoast, PA-C 10/02/20 2237    Alvira Monday, MD 10/04/20 Salley Hews    Alvira Monday, MD 10/04/20 0006

## 2020-10-02 NOTE — ED Triage Notes (Signed)
Pt arrived via walk in,c/o rash to entire body. Abcess on right arm she believes is from drug use. Also states she was told she was pregnant a month ago, has had no follow up. Requesting detox from heroin and fentanyl.

## 2020-10-02 NOTE — Progress Notes (Signed)
CSW updated the pt who was exceedingly desirous of leaving quickly although the pt accepted the offered resources before quickly asking for sandwiches from the CSW and immediately exited the ED and went straight to the bathroom in the waiting room.  CSW waited to provide pt with her D/C paperwork.  Per the EDP, pt is not supposed to be eating the ED sandwiches as the pt is pregnant.  Please reconsult if future social work needs arise.  CSW signing off, as social work intervention is no longer needed.  Dorothe Pea. Edilia Ghuman  MSW, LCSW, LCAS, CCS Transitions of Care Clinical Social Worker Care Coordination Department Ph: [phone]

## 2020-10-02 NOTE — Progress Notes (Signed)
   10/02/20 2234  TOC ED Mini Assessment  TOC Time spent with patient (minutes): 25  PING Used in TOC Assessment Yes  Admission or Readmission Diverted Yes  What brought you to the Emergency Department?  Patient  has rash  wanting detox  Patient is uninsured and needs antibiotics for discharge as per EDP. Patient [facility] [address]

## 2020-10-02 NOTE — Progress Notes (Addendum)
CSW received a call from the RN CM who states pt's needs for meds were satisfied, but is requesting assistance with detox and homelessness.  CSW spoke to Peer Support who will arrive for his 11pm shift and will be able to speak to the family and states [facility] may have some detox beds available.  CSW called [facility] at ph: [phone] who states that if the pt calls back after 11pm for a telephone assessment and completes it and the pt's information is faxed to them at [phone] then a MD will need to review pt's referral and agree to the pt's acceptance due to the pt's first trimester pregnancy and if accepted would likely be able to gain admittance on Saturday 10/03/20.  8:58 PM Pt agreeable to being referred to detox and speaking to Peer Support after 11pm if Peer Support is available and provided verbal permission for the CSW to fax over pt's referral in case pt is still in the ED after 11pm  Pt was strongly encouraged to call contacts, and stated pt's only contact, her Mom, states pt cannot come there and pt hopes to spend the night in the ED waiting room overnight and states she was allowed to do that once before.  Peer Support aware and will speak to pt and follow up with [facility] after 11pm to facilitate the telephone assessment.  EDP states pt will be possibly considered for an overnight stay if the referral is accepted for review and passed onto the MD at [facility] to sign off on it for a likely stay on 10/03/20.  CSW to fax referral now and wil follow up with peer support who is coming in at 11pm.  EPD updated.  CSW will continue to follow for D/C needs.  Dorothe Pea. Koleson Reifsteck  MSW, LCSW, LCAS, CCS Transitions of Care Clinical Social Worker Care Coordination Department Ph: [phone]

## 2020-10-02 NOTE — ED Notes (Signed)
While this RN was in another patients room, MD provided patient with discharge papers.  Discharge vitals were not obtained bc patient left ED before this RN could reassess them. IV was removed by patient from pts LAC and visualized in trash.

## 2020-10-02 NOTE — Care Management (Signed)
  MATCH Medication Assistance Card Name: Rianne Degraaf ID (MRN): [id number] [address] [employer]: [id number] RX Group: [id number] Discharge Date: 10/02/2020 Expiration Date:10/12/2020                                           (must be filled within 7 days of discharge)   Dear   : Meghan Bullock  You have been approved to have the prescriptions written by your discharging physician filled through our [facility] (Medication Assistance Through [facility]) program. This program allows for a one-time (no refills) 34-day supply of selected medications for a low copay amount.  The copay is $3.00 per prescription. For instance, if you have one prescription, you will pay $3.00; for two prescriptions, you pay $6.00; for three prescriptions, you pay $9.00; and so on.  Only certain pharmacies are participating in this program with [facility]. You will need to select one of the pharmacies from the attached list and take your prescriptions, this letter, and your photo ID to one of the participating pharmacies.   We are excited that you are able to use the [facility] program to get your medications. These prescriptions must be filled within 7 days of hospital discharge or they will no longer be valid for the [facility] program. Should you have any problems with your prescriptions please contact your case management team member at [phone] for /[place].  Thank you, [facility] Care Management

## 2020-10-02 NOTE — Progress Notes (Signed)
CSW sent pt's referral only to be told by a more current [facility] employee that [facility] does not accept pregnant clients as the pt is in her first trimester.  CSW updated EDP and stated the CSW and Peer Support can offer pt detox/[place]/homelessness resources and sandwhiches and a blanket and and request that the CN consider allowing the pt to remain in the ED Waiting room until morning until the bus's start running and the pt can then tilize the bus pass the CSW can provide the pt tonight.  EDP voiced understanding this was what the CSW could offer and stated this plan was agreeable.  CSW will continue to follow for D/C needs.  Dorothe Pea. Tyreek Clabo  MSW, LCSW, LCAS, CCS Transitions of Care Clinical Social Worker Care Coordination Department Ph: [phone]

## 2020-10-02 NOTE — Discharge Instructions (Addendum)
  Acetaminophen: May take acetaminophen (generic for Tylenol), as needed, for pain. Your daily total maximum amount of acetaminophen from all sources should be limited to 4000mg /day for persons without liver problems, or 2000mg /day for those with liver problems.  Please take all of your antibiotics until finished!   You may develop abdominal discomfort or diarrhea from the antibiotic.  You may help offset this with probiotics which you can buy or get in yogurt. Do not eat or take the probiotics until 2 hours after your antibiotic.   We recommend following up with a primary care provider and OB/GYN.

## 2020-10-07 LAB — CULTURE, BLOOD (ROUTINE X 2)
Culture: NO GROWTH
Culture: NO GROWTH

## 2020-10-27 ENCOUNTER — Other Ambulatory Visit: Payer: Self-pay

## 2020-10-27 ENCOUNTER — Inpatient Hospital Stay (HOSPITAL_COMMUNITY)
Admission: EM | Admit: 2020-10-27 | Discharge: 2020-10-27 | Disposition: A | Discharge status: Home / Self Care | Payer: Medicaid Other | Attending: Obstetrics & Gynecology | Admitting: Obstetrics & Gynecology

## 2020-10-27 ENCOUNTER — Encounter (HOSPITAL_COMMUNITY): Payer: Self-pay | Admitting: Obstetrics & Gynecology

## 2020-10-27 DIAGNOSIS — O26891 Other specified pregnancy related conditions, first trimester: Secondary | ICD-10-CM

## 2020-10-27 DIAGNOSIS — F17210 Nicotine dependence, cigarettes, uncomplicated: Secondary | ICD-10-CM | POA: Insufficient documentation

## 2020-10-27 DIAGNOSIS — R12 Heartburn: Secondary | ICD-10-CM | POA: Insufficient documentation

## 2020-10-27 DIAGNOSIS — Z3A11 11 weeks gestation of pregnancy: Secondary | ICD-10-CM | POA: Diagnosis not present

## 2020-10-27 DIAGNOSIS — O219 Vomiting of pregnancy, unspecified: Secondary | ICD-10-CM | POA: Diagnosis not present

## 2020-10-27 DIAGNOSIS — O99611 Diseases of the digestive system complicating pregnancy, first trimester: Secondary | ICD-10-CM | POA: Diagnosis not present

## 2020-10-27 DIAGNOSIS — O99331 Smoking (tobacco) complicating pregnancy, first trimester: Secondary | ICD-10-CM | POA: Diagnosis not present

## 2020-10-27 DIAGNOSIS — O210 Mild hyperemesis gravidarum: Secondary | ICD-10-CM | POA: Diagnosis not present

## 2020-10-27 LAB — URINALYSIS, ROUTINE W REFLEX MICROSCOPIC
Bilirubin Urine: NEGATIVE
Glucose, UA: NEGATIVE mg/dL
Hgb urine dipstick: NEGATIVE
Ketones, ur: 20 mg/dL — AB
Leukocytes,Ua: NEGATIVE
Nitrite: NEGATIVE
Protein, ur: 100 mg/dL — AB
Specific Gravity, Urine: 1.028 (ref 1.005–1.030)
pH: 6 (ref 5.0–8.0)

## 2020-10-27 MED ORDER — FAMOTIDINE IN NACL 20-0.9 MG/50ML-% IV SOLN
20.0000 mg | Freq: Once | INTRAVENOUS | Status: AC
  Administered 2020-10-27: 20 mg via INTRAVENOUS
  Filled 2020-10-27: qty 50

## 2020-10-27 MED ORDER — SODIUM CHLORIDE 0.9 % IV SOLN
25.0000 mg | Freq: Once | INTRAVENOUS | Status: AC
  Administered 2020-10-27: 25 mg via INTRAVENOUS
  Filled 2020-10-27: qty 1

## 2020-10-27 NOTE — Discharge Instructions (Signed)
Hyperemesis Gravidarum Hyperemesis gravidarum is a severe form of nausea and vomiting that happens during pregnancy. Hyperemesis is worse than morning sickness. It may cause you to have nausea or vomiting all day for many days. It may keep you from eating and drinking enough food and liquids, which can lead to dehydration, malnutrition, and weight loss. Hyperemesis usually occurs during the first half (the first 20 weeks) of pregnancy. It often goes away once a woman is in her second half of pregnancy. However, sometimes hyperemesis continues through an entire pregnancy. What are the causes? The cause of this condition is not known. It may be related to changes in chemicals (hormones) in the body during pregnancy, such as the high level of pregnancy hormone (human chorionic gonadotropin) or the increase in the female sex hormone (estrogen). What are the signs or symptoms? Symptoms of this condition include:  Nausea that does not go away.  Vomiting that does not allow you to keep any food down.  Weight loss.  Body fluid loss (dehydration).  Having no desire to eat, or not liking food that you have previously enjoyed. How is this diagnosed? This condition may be diagnosed based on:  A physical exam.  Your medical history.  Your symptoms.  Blood tests.  Urine tests. How is this treated? This condition is managed by controlling symptoms. This may include:  Following an eating plan. This can help lessen nausea and vomiting.  Taking prescription medicines. An eating plan and medicines are often used together to help control symptoms. If medicines do not help relieve nausea and vomiting, you may need to receive fluids through an IV at the hospital. Follow these instructions at home: Eating and drinking   Avoid the following: ? Drinking fluids with meals. Try not to drink anything during the 30 minutes before and after your meals. ? Drinking more than 1 cup of fluid at a  time. ? Eating foods that trigger your symptoms. These may include spicy foods, coffee, high-fat foods, very sweet foods, and acidic foods. ? Skipping meals. Nausea can be more intense on an empty stomach. If you cannot tolerate food, do not force it. Try sucking on ice chips or other frozen items and make up for missed calories later. ? Lying down within 2 hours after eating. ? Being exposed to environmental triggers. These may include food smells, smoky rooms, closed spaces, rooms with strong smells, warm or humid places, overly loud and noisy rooms, and rooms with motion or flickering lights. Try eating meals in a well-ventilated area that is free of strong smells. ? Quick and sudden changes in your movement. ? Taking iron pills and multivitamins that contain iron. If you take prescription iron pills, do not stop taking them unless your health care provider approves. ? Preparing food. The smell of food can spoil your appetite or trigger nausea.  To help relieve your symptoms: ? Listen to your body. Everyone is different and has different preferences. Find what works best for you. ? Eat and drink slowly. ? Eat 5-6 small meals daily instead of 3 large meals. Eating small meals and snacks can help you avoid an empty stomach. ? In the morning, before getting out of bed, eat a couple of crackers to avoid moving around on an empty stomach. ? Try eating starchy foods as these are usually tolerated well. Examples include cereal, toast, bread, potatoes, pasta, rice, and pretzels. ? Include at least 1 serving of protein with your meals and snacks. Protein options include   lean meats, poultry, seafood, beans, nuts, nut butters, eggs, cheese, and yogurt. ? Try eating a protein-rich snack before bed. Examples of a protein-rick snack include cheese and crackers or a peanut butter sandwich made with 1 slice of whole-wheat bread and 1 tsp (5 g) of peanut butter. ? Eat or suck on things that have ginger in them.  It may help relieve nausea. Add  tsp ground ginger to hot tea or choose ginger tea. ? Try drinking 100% fruit juice or an electrolyte drink. An electrolyte drink contains sodium, potassium, and chloride. ? Drink fluids that are cold, clear, and carbonated or sour. Examples include lemonade, ginger ale, lemon-lime soda, ice water, and sparkling water. ? Brush your teeth or use a mouth rinse after meals. ? Talk with your health care provider about starting a supplement of vitamin B6. General instructions  Take over-the-counter and prescription medicines only as told by your health care provider.  Follow instructions from your health care provider about eating or drinking restrictions.  Continue to take your prenatal vitamins as told by your health care provider. If you are having trouble taking your prenatal vitamins, talk with your health care provider about different options.  Keep all follow-up and pre-birth (prenatal) visits as told by your health care provider. This is important. Contact a health care provider if:  You have pain in your abdomen.  You have a severe headache.  You have vision problems.  You are losing weight.  You feel weak or dizzy. Get help right away if:  You cannot drink fluids without vomiting.  You vomit blood.  You have constant nausea and vomiting.  You are very weak.  You faint.  You have a fever and your symptoms suddenly get worse. Summary  Hyperemesis gravidarum is a severe form of nausea and vomiting that happens during pregnancy.  Making some changes to your eating habits may help relieve nausea and vomiting.  This condition may be managed with medicine.  If medicines do not help relieve nausea and vomiting, you may need to receive fluids through an IV at the hospital. This information is not intended to replace advice given to you by your health care provider. Make sure you discuss any questions you have with your health care  provider. Document Revised: 12/04/2017 Document Reviewed: 07/13/2016 Elsevier Patient Education  2020 Elsevier Inc[employer]  [place] [facility] Providers    [facility]      Phone: [phone]  [facility] [place]   Phone: [phone]  [facility] [place]  Phone: [phone]  [facility] at [employer]  Phone: [phone]  [facility] at [place]  Phone: [phone]  [facility]   Phone: [phone]  [facility]       Phone: [phone]  [facility]    Phone: [phone]   [facility]    Phone: [phone]  [facility]    Phone: [phone]  [facility]    Phone: [phone]  [facility]       Phone: [phone]   [facility]  Phone: [phone]  [facility]    Phone: [phone]  [facility]   Phone: [phone]  [facility]      Phone: [phone]  [facility]    Phone: [phone]

## 2020-10-27 NOTE — MAU Note (Signed)
Can't keep anything down, "has thrown up cup and trash cans of stomach acid". abd is sore from throwing up so much. Not taking any medication for vomiting.

## 2020-10-27 NOTE — MAU Provider Note (Addendum)
History     CSN: [id number]  Arrival date and time: 10/27/20 1039   None     Chief Complaint  Patient presents with   Emesis   Nausea   29 yr old [id number] at [redacted]w[redacted]d presents with two day history of worsening nausea and vomiting. Reports vomiting brownish clear fluid that now results in a burning sensation after she vomits. Denies hematemesis. Reports generalized abdominal pain and weakness attributed to multiple episodes of emesis. She has been unable to tolerate any oral intake, including both solids and liquids. States that she has not felt like this previously but does have a history of worsening reflux with prior pregnancies. Has tried ginger ale, saltine crackers, chicken noodle soup and yogurt but has been unsuccessful in being able to keep any of these items down. Nothing alleviates the symptoms while oral intake is the only thing that makes it worse. Endorses tobacco use during this pregnancy but unable to smoke the last 2 days due to her emetic symptoms. Also reports current marijuana use, previously chronic heroin user that she has stopped use since her last visit to the MAU last month. Denies alcohol use during this pregnancy.    OB History     Gravida  5   Para  2   Term  1   Preterm  1   AB  2   Living  2      SAB  2   TAB      Ectopic      Multiple  0   Live Births  2           Past Medical History:  Diagnosis Date   Anemia    Anxiety    Bipolar 1 disorder (HCC)    Chlamydia    Hepatitis    Hepatitis C   Heroin abuse (HCC)    Mononucleosis    Ovarian cyst    Polysubstance abuse (HCC)     Past Surgical History:  Procedure Laterality Date   HAND SURGERY     I & D EXTREMITY Left 09/16/2019   Procedure: IRRIGATION AND DEBRIDEMENT EXTREMITY;  Surgeon: Ernest Mallick, MD;  Location: [facility] OR;  Service: Orthopedics;  Laterality: Left;    Family History  Problem Relation Age of Onset   COPD Mother    Anxiety disorder Mother     Emphysema Mother     Social History   Tobacco Use   Smoking status: Current Every Day Smoker    Packs/day: 1.50    Years: 5.00    Pack years: 7.50    Types: Cigarettes   Smokeless tobacco: Never Used  Vaping Use   Vaping Use: Never used  Substance Use Topics   Alcohol use: No   Drug use: Yes    Types: Heroin, Marijuana    Comment: fentanyl-heroin 09/29/2020    Allergies:  Allergies  Allergen Reactions   Fentanyl Hives   Acetaminophen Itching    No medications prior to admission.    Review of Systems  Constitutional: Positive for chills and diaphoresis. Negative for appetite change and fever.  HENT: Negative for congestion and rhinorrhea.   Eyes: Negative for visual disturbance.  Respiratory: Positive for cough. Negative for shortness of breath.   Cardiovascular: Negative for chest pain and leg swelling.  Gastrointestinal: Positive for abdominal pain, nausea and vomiting. Negative for diarrhea.  Genitourinary: Negative for dysuria, vaginal bleeding and vaginal discharge.  Neurological: Positive for weakness. Negative for dizziness and headaches.  Physical Exam   Blood pressure 126/76, pulse 87, temperature 98.2 F (36.8 C), temperature source Oral, resp. rate 20, height 5\' 4"  (1.626 m), weight 72.2 kg, last menstrual period 08/08/2020, SpO2 100 %.  Physical Exam Constitutional:      General: She is not in acute distress.    Appearance: Normal appearance.  HENT:     Head: Normocephalic and atraumatic.     Nose: No congestion or rhinorrhea.  Eyes:     General:        Right eye: No discharge.        Left eye: No discharge.  Cardiovascular:     Rate and Rhythm: Normal rate and regular rhythm.     Pulses: Normal pulses.     Heart sounds: No murmur heard.  No gallop.   Pulmonary:     Effort: Pulmonary effort is normal.     Breath sounds: Normal breath sounds.  Abdominal:     General: Bowel sounds are normal.     Palpations: Abdomen is soft.     Tenderness:  There is no abdominal tenderness.  Musculoskeletal:     Cervical back: Neck supple.     Right lower leg: No edema.     Left lower leg: No edema.  Skin:    General: Skin is warm and dry.  Neurological:     Mental Status: She is alert.  Psychiatric:        Mood and Affect: Mood normal.     Comments: Anxious-appearing      Patient Vitals for the past 24 hrs:  BP Temp Temp src Pulse Resp SpO2 Height Weight  10/27/20 1753 126/76 -- -- 87 -- -- -- --  10/27/20 1425 110/64 98.2 F (36.8 C) Oral 95 20 100 % -- --  10/27/20 1227 135/79 98.6 F (37 C) Oral 96 18 98 % 5\' 4"  (1.626 m) 72.2 kg  10/27/20 1118 -- -- -- -- -- -- 5\' 4"  (1.626 m) 76.7 kg  10/27/20 1117 123/78 98.1 F (36.7 C) Oral (!) 106 16 97 % -- --     No results found.    MAU Course  Procedures  MDM 29 yr old at [redacted]w[redacted]d presents with worsening nausea and vomiting. History and labs reviewed. Patient given pepcid and phenergan, observed in the MAU with symptoms that improved.   Orders Placed This Encounter  Procedures   Urinalysis, Routine w reflex microscopic Urine, Clean Catch    Standing Status:   Standing    Number of Occurrences:   1   Rapid urine drug screen (hospital performed)    Standing Status:   Standing    Number of Occurrences:   1   Discharge patient    Order Specific Question:   Discharge disposition    Answer:   01-Home or Self Care [1]    Order Specific Question:   Discharge patient date    Answer:   10/27/2020   Meds ordered this encounter  Medications   promethazine (PHENERGAN) 25 mg in sodium chloride 0.9 % 1,000 mL infusion   famotidine (PEPCID) IVPB 20 mg premix    Assessment and Plan  Nausea and vomiting in pregnancy Heartburn  Patient discharged home with return precautions given and prescriptions for phenergan and pepcid Pt to establish routine OB care.  [id number] 10/27/2020, 7:03 PM   Attestation of Supervision of Student:  I confirm that I have verified the  information documented in the medical student's note and that I  have also personally performed the history, physical exam and all medical decision making activities.  I have verified that all services and findings are accurately documented in this student's note; and I agree with management and plan as outlined in the documentation. I have also made any necessary editorial changes.  Bernerd Limbo, CNM [facility] [employer] [facility] 10/29/2020 7:00 PM

## 2020-10-27 NOTE — ED Triage Notes (Signed)
Emergency Medicine Provider OB Triage Evaluation Note  Meghan Bullock is a 29 y.o. female, [id number], at [redacted]w[redacted]d gestation who presents to the emergency department with complaints of vomiting.  Review of  Systems  Positive: vomiting Negative: abd pain  Physical Exam  LMP 08/08/2020  General: Awake, no distress  HEENT: Atraumatic  Resp: Normal effort  Cardiac: Normal rate Abd: Nondistended, nontender  MSK: Moves all extremities without difficulty Neuro: Speech clear  Medical Decision Making  Pt evaluated for pregnancy concern and is stable for transfer to MAU. Pt is in agreement with plan for transfer.  11:09 AM Discussed with MAU APP, Edd Arbour, CNM, who accepts patient in transfer.  Clinical Impression  No diagnosis found.     Arthor Captain, PA-C 10/27/20 1113

## 2020-10-27 NOTE — ED Notes (Signed)
PA Harris cleared patient for MAU transfer, transport called to take patient over.

## 2020-11-26 ENCOUNTER — Encounter: Payer: Self-pay | Admitting: Obstetrics and Gynecology

## 2020-11-26 ENCOUNTER — Telehealth: Payer: Self-pay | Admitting: Obstetrics and Gynecology

## 2020-11-26 NOTE — Telephone Encounter (Signed)
Pt did not come to the appt today.  I called pt and left a voicemail with pt to call back to resch. thanks

## 2020-11-28 NOTE — L&D Delivery Note (Signed)
OB/GYN Faculty Practice Delivery Note  Meghan Bullock is a 30 y.o. [id number] s/p SVD at [redacted]w[redacted]d. She was admitted for PPROM.   ROM: 19h 38m with clear fluid GBS Status:  Negative/-- (05/17 1123) Maximum Maternal Temperature: 98.4 F  Labor Progress: . Patient presented to L&D for PPROM. Initial SVE: 1.5cm/80%/-3. Labor course was uncomplicated. She then progressed to complete.   Delivery Date/Time:  Delivery: Called to room and patient was complete and pushing. Head position was LOA and delivered with ease over the perineum. No nuchal cord present. Shoulder and body delivered in usual fashion. Infant with spontaneous cry, placed on mother's abdomen, dried and stimulated. Cord clamped x 2 after 1-minute delay, and cut by FOB. Cord blood drawn. Placenta delivered spontaneously with gentle cord traction. Fundus firm with massage and pitocin started. Labia, perineum, vagina, and cervix inspected and significant for mild bilateral abrasions, no lacerations.  Baby Weight: pending  Cord: central insertion, 3 vessel Placenta: Sent to L&D Complications: None Lacerations:None EBL: 100cc Analgesia: Epidural  Infant: APGAR (1 MIN): 8 APGAR (5 MINS): 9   Will Meghan Molner MD, PGY-1 OBGYN Faculty Teaching Service  04/20/2021, 8:13 PM

## 2020-11-28 NOTE — L&D Delivery Note (Deleted)
Labor Progress Note Meghan Bullock is a 30 y.o. G4P1112 at [redacted]w[redacted]d presented for PPROM, found to have preeclampsia without severe features on admission.   S: Patient laying comfortably, tired. Denies HA, change is vision, RUQ pain, CP, SOB. 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 She was noted to have multiple severe range SBP (160s), and therefore discussed with patient regarding beginning magnesium gtt and BP meds.   O:  BP 132/84   Pulse 73   Temp 98.4 F (36.9 C) (Axillary)   Resp 18   Ht 5' 4" (1.626 m)   Wt 80.3 kg   LMP 08/08/2020   SpO2 94%   BMI 30.38 kg/m  EFM: 120/mod var/+accels, no decels  CVE: Dilation: 3 Effacement (%): 80 Cervical Position: Anterior Station: -2 Presentation: Vertex Exam by:: S Moyer RN   A&P: 30 y.o. G4P1112 [redacted]w[redacted]d here for PPROM and preeclampsia, now with severe features (2/2 SBP 160s).  #Labor: Progressing well. Continue pitocin #Pain: Epidural #FWB: Cat I #GBS negative #PreE: Begin magnesium gtt, IV labetalol prn for severe range pressures   Makhia Vosler, DO 10:07 AM  

## 2020-11-29 ENCOUNTER — Emergency Department (HOSPITAL_COMMUNITY)
Admission: EM | Admit: 2020-11-29 | Discharge: 2020-11-29 | Disposition: A | Discharge status: Home / Self Care | Payer: Medicaid Other | Attending: Emergency Medicine | Admitting: Emergency Medicine

## 2020-11-29 ENCOUNTER — Encounter (HOSPITAL_COMMUNITY): Payer: Self-pay | Admitting: *Deleted

## 2020-11-29 ENCOUNTER — Other Ambulatory Visit: Payer: Self-pay

## 2020-11-29 DIAGNOSIS — E876 Hypokalemia: Secondary | ICD-10-CM

## 2020-11-29 DIAGNOSIS — F17210 Nicotine dependence, cigarettes, uncomplicated: Secondary | ICD-10-CM | POA: Diagnosis not present

## 2020-11-29 DIAGNOSIS — T400X1A Poisoning by opium, accidental (unintentional), initial encounter: Secondary | ICD-10-CM | POA: Diagnosis not present

## 2020-11-29 DIAGNOSIS — O9A212 Injury, poisoning and certain other consequences of external causes complicating pregnancy, second trimester: Secondary | ICD-10-CM | POA: Diagnosis not present

## 2020-11-29 DIAGNOSIS — O99332 Smoking (tobacco) complicating pregnancy, second trimester: Secondary | ICD-10-CM | POA: Diagnosis not present

## 2020-11-29 DIAGNOSIS — Z3A18 18 weeks gestation of pregnancy: Secondary | ICD-10-CM | POA: Diagnosis not present

## 2020-11-29 DIAGNOSIS — T40601A Poisoning by unspecified narcotics, accidental (unintentional), initial encounter: Secondary | ICD-10-CM

## 2020-11-29 LAB — CBC WITH DIFFERENTIAL/PLATELET
Abs Immature Granulocytes: 0.1 10*3/uL — ABNORMAL HIGH (ref 0.00–0.07)
Basophils Absolute: 0 10*3/uL (ref 0.0–0.1)
Basophils Relative: 0 %
Eosinophils Absolute: 0 10*3/uL (ref 0.0–0.5)
Eosinophils Relative: 0 %
HCT: 33 % — ABNORMAL LOW (ref 36.0–46.0)
Hemoglobin: 10.2 g/dL — ABNORMAL LOW (ref 12.0–15.0)
Immature Granulocytes: 1 %
Lymphocytes Relative: 6 %
Lymphs Abs: 0.6 10*3/uL — ABNORMAL LOW (ref 0.7–4.0)
MCH: 23.3 pg — ABNORMAL LOW (ref 26.0–34.0)
MCHC: 30.9 g/dL (ref 30.0–36.0)
MCV: 75.3 fL — ABNORMAL LOW (ref 80.0–100.0)
Monocytes Absolute: 0.7 10*3/uL (ref 0.1–1.0)
Monocytes Relative: 6 %
Neutro Abs: 9.1 10*3/uL — ABNORMAL HIGH (ref 1.7–7.7)
Neutrophils Relative %: 87 %
Platelets: 267 10*3/uL (ref 150–400)
RBC: 4.38 MIL/uL (ref 3.87–5.11)
RDW: 15.9 % — ABNORMAL HIGH (ref 11.5–15.5)
WBC: 10.6 10*3/uL — ABNORMAL HIGH (ref 4.0–10.5)
nRBC: 0 % (ref 0.0–0.2)

## 2020-11-29 LAB — BASIC METABOLIC PANEL
Anion gap: 14 (ref 5–15)
BUN: 9 mg/dL (ref 6–20)
CO2: 21 mmol/L — ABNORMAL LOW (ref 22–32)
Calcium: 8.9 mg/dL (ref 8.9–10.3)
Chloride: 96 mmol/L — ABNORMAL LOW (ref 98–111)
Creatinine, Ser: 0.6 mg/dL (ref 0.44–1.00)
GFR, Estimated: >60 mL/min (ref >60)
Glucose, Bld: 93 mg/dL (ref 70–99)
Potassium: 2.6 mmol/L — CL (ref 3.5–5.1)
Sodium: 131 mmol/L — ABNORMAL LOW (ref 135–145)

## 2020-11-29 LAB — ETHANOL: Alcohol, Ethyl (B): <10 mg/dL (ref <10)

## 2020-11-29 MED ORDER — POTASSIUM CHLORIDE CRYS ER 20 MEQ PO TBCR
40.0000 meq | EXTENDED_RELEASE_TABLET | Freq: Once | ORAL | Status: AC
  Administered 2020-11-29: 40 meq via ORAL
  Filled 2020-11-29: qty 2

## 2020-11-29 MED ORDER — SODIUM CHLORIDE 0.9 % IV BOLUS
1000.0000 mL | Freq: Once | INTRAVENOUS | Status: AC
  Administered 2020-11-29: 1000 mL via INTRAVENOUS

## 2020-11-29 MED ORDER — POTASSIUM CHLORIDE 10 MEQ/100ML IV SOLN
10.0000 meq | Freq: Once | INTRAVENOUS | Status: AC
  Administered 2020-11-29: 10 meq via INTRAVENOUS
  Filled 2020-11-29: qty 100

## 2020-11-29 NOTE — ED Triage Notes (Signed)
The pt arrived by [facility] home  The pt was found by her mother on the floor of the house with lips and fingernails cyanotic  Fire arrived they gave  Her  4mg  of narcan im   When ems arrived the pt was alert and oriented  Ib arrival to the ed the pt is alert and oriented.  She reports that she died tonight x 2  Using fentanyl iv   Had not used since November   She is 4-5 months pregnant no answer to when was lmp scaring on both arms over her veins

## 2020-11-29 NOTE — Discharge Instructions (Addendum)
Follow-up with your primary doctor and return to the ER if you experience new and/or concerning issues.

## 2020-11-29 NOTE — ED Provider Notes (Signed)
[facility] EMERGENCY DEPARTMENT Provider Note   CSN: [id number] Arrival date & time: 11/29/20  0130     History Chief Complaint  Patient presents with  . Drug Overdose    Meghan Bullock is a 30 y.o. female.  Patient is a 30 year old female with history of bipolar, anxiety, polysubstance abuse.  She is currently pregnant at approximately [redacted] weeks gestation.  She is brought by EMS after being found unresponsive on the floor of her home by her mother.  Patient apparently used fentanyl on 2 occasions this evening.  Patient was given Narcan by EMS, then regained consciousness.  Patient denies any complaints at present.  She tells me she has been clean since the beginning of November, but relapsed today.  The history is provided by the patient.  Drug Overdose The problem occurs constantly. The problem has been gradually improving. Pertinent negatives include no chest pain and no headaches. Nothing aggravates the symptoms. Nothing relieves the symptoms. She has tried nothing for the symptoms.       Past Medical History:  Diagnosis Date  . Anemia   . Anxiety   . Bipolar 1 disorder (HCC)   . Chlamydia   . Hepatitis    Hepatitis C  . Heroin abuse (HCC)   . Mononucleosis   . Ovarian cyst   . Polysubstance abuse [facility])     Patient Active Problem List   Diagnosis Date Noted  . Abscess of wrist 03/11/2020  . Substance use disorder 03/11/2020  . Cellulitis and abscess of hand   . 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HCC)   . Normal labor 06/17/2018  . Postpartum care following vaginal delivery 06/17/2018  . Encounter for induction of labor 06/15/2018  . Iron deficiency anemia 04/30/2018  . Supervision of high risk pregnancy, antepartum 10/25/2017  . Substance abuse affecting pregnancy, antepartum 10/25/2017  . H/O preterm delivery, currently pregnant, first trimester 10/25/2017  . Hepatitis C 10/23/2017  . Preterm premature rupture of membranes (PPROM) with unknown onset of labor  05/12/2014  . Encephalopathy, toxic 10/03/2013  . Benzodiazepine abuse (HCC) 10/03/2013  . Heroin abuse [facility])     Past Surgical History:  Procedure Laterality Date  . HAND SURGERY    . I & D EXTREMITY Left 09/16/2019   Procedure: IRRIGATION AND DEBRIDEMENT EXTREMITY;  Surgeon: Ernest Mallick, MD;  Location: [facility] OR;  Service: Orthopedics;  Laterality: Left;     OB History    Gravida  5   Para  2   Term  1   Preterm  1   AB  2   Living  2     SAB  2   IAB      Ectopic      Multiple  0   Live Births  2           Family History  Problem Relation Age of Onset  . COPD Mother   . Anxiety disorder Mother   . Emphysema Mother     Social History   Tobacco Use  . Smoking status: Current Every Day Smoker    Packs/day: 1.50    Years: 5.00    Pack years: 7.50    Types: Cigarettes  . Smokeless tobacco: Never Used  Vaping Use  . Vaping Use: Never used  Substance Use Topics  . Alcohol use: No  . Drug use: Yes    Types: Heroin, Marijuana    Comment: fentanyl-heroin 09/29/2020    Home Medications Prior to  Admission medications   Medication Sig Start Date End Date Taking? Authorizing Provider  EPINEPHrine 0.3 mg/0.3 mL IJ SOAJ injection Inject 0.3 mg into the muscle as needed for anaphylaxis. 09/29/20   Sabas Sous, MD  promethazine (PHENERGAN) 25 MG tablet Take 1 tablet (25 mg total) by mouth every 6 (six) hours as needed for nausea or vomiting. 09/16/20   Judeth Horn, NP    Allergies    Fentanyl and Acetaminophen  Review of Systems   Review of Systems  Cardiovascular: Negative for chest pain.  Neurological: Negative for headaches.  All other systems reviewed and are negative.   Physical Exam Updated Vital Signs Ht 5\' 4"  (1.626 m)   Wt 80.2 kg   LMP 08/08/2020   BMI 30.35 kg/m   Physical Exam Vitals and nursing note reviewed.  Constitutional:      General: She is not in acute distress.    Appearance: She is well-developed and  well-nourished. She is not diaphoretic.  HENT:     Head: Normocephalic and atraumatic.  Eyes:     Extraocular Movements: Extraocular movements intact.     Pupils: Pupils are equal, round, and reactive to light.  Cardiovascular:     Rate and Rhythm: Normal rate and regular rhythm.     Heart sounds: No murmur heard. No friction rub. No gallop.   Pulmonary:     Effort: Pulmonary effort is normal. No respiratory distress.     Breath sounds: Normal breath sounds. No wheezing.  Abdominal:     General: Bowel sounds are normal. There is no distension.     Palpations: Abdomen is soft.     Tenderness: There is no abdominal tenderness.  Musculoskeletal:        General: Normal range of motion.     Cervical back: Normal range of motion and neck supple.  Skin:    General: Skin is warm and dry.  Neurological:     Mental Status: She is alert and oriented to person, place, and time.     ED Results / Procedures / Treatments   Labs (all labs ordered are listed, but only abnormal results are displayed) Labs Reviewed  RAPID URINE DRUG SCREEN, HOSP PERFORMED  BASIC METABOLIC PANEL  CBC WITH DIFFERENTIAL/PLATELET  ETHANOL    EKG EKG Interpretation  Date/Time:  Sunday November 29 2020 01:18:04 EST Ventricular Rate:  121 PR Interval:    QRS Duration: 87 QT Interval:  324 QTC Calculation: 460 R Axis:   90 Text Interpretation: Sinus tachycardia Borderline right axis deviation No significant change since 10/02/2020 Confirmed by 13/03/2020 (Geoffery Lyons) on 11/29/2020 1:35:28 AM   Radiology No results found.  Procedures Procedures (including critical care time)  Medications Ordered in ED Medications  sodium chloride 0.9 % bolus 1,000 mL (has no administration in time range)    ED Course  I have reviewed the triage vital signs and the nursing notes.  Pertinent labs & imaging results that were available during my care of the patient were reviewed by me and considered in my medical decision  making (see chart for details).    MDM Rules/Calculators/A&P  Patient with history of polysubstance abuse brought for evaluation of overdose.  Patient reports using which she believes now is fentanyl.  She apparently stopped breathing and turned blue.  She was found by her mother on the floor who called 911.  She was given Narcan by EMS and is now awake, alert, and back to baseline.  She has been observed  for several hours and remains awake.  Laboratory studies unremarkable with the exception of a potassium of 2.6.  Patient given IV and oral potassium replacement.  At this point, discharge seems appropriate with as needed return.  Final Clinical Impression(s) / ED Diagnoses Final diagnoses:  None    Rx / DC Orders ED Discharge Orders    None       Veryl Speak, MD 11/29/20 [phone]

## 2020-12-14 ENCOUNTER — Telehealth: Payer: Self-pay | Admitting: Obstetrics and Gynecology

## 2020-12-24 ENCOUNTER — Ambulatory Visit (INDEPENDENT_AMBULATORY_CARE_PROVIDER_SITE_OTHER): Payer: Self-pay | Admitting: Obstetrics and Gynecology

## 2020-12-24 ENCOUNTER — Encounter: Payer: Self-pay | Admitting: Obstetrics and Gynecology

## 2020-12-24 ENCOUNTER — Other Ambulatory Visit: Payer: Self-pay

## 2020-12-24 ENCOUNTER — Other Ambulatory Visit (HOSPITAL_COMMUNITY)
Admission: RE | Admit: 2020-12-24 | Discharge: 2020-12-24 | Disposition: A | Discharge status: Home / Self Care | Payer: Medicaid Other | Source: Ambulatory Visit | Attending: Obstetrics and Gynecology | Admitting: Obstetrics and Gynecology

## 2020-12-24 VITALS — Wt 165.0 lb

## 2020-12-24 DIAGNOSIS — Z3A19 19 weeks gestation of pregnancy: Secondary | ICD-10-CM

## 2020-12-24 DIAGNOSIS — O99320 Drug use complicating pregnancy, unspecified trimester: Secondary | ICD-10-CM

## 2020-12-24 DIAGNOSIS — O0990 Supervision of high risk pregnancy, unspecified, unspecified trimester: Secondary | ICD-10-CM | POA: Insufficient documentation

## 2020-12-24 DIAGNOSIS — F1310 Sedative, hypnotic or anxiolytic abuse, uncomplicated: Secondary | ICD-10-CM

## 2020-12-24 DIAGNOSIS — F1990 Other psychoactive substance use, unspecified, uncomplicated: Secondary | ICD-10-CM

## 2020-12-24 DIAGNOSIS — B182 Chronic viral hepatitis C: Secondary | ICD-10-CM

## 2020-12-24 DIAGNOSIS — F1110 Opioid abuse, uncomplicated: Secondary | ICD-10-CM

## 2020-12-24 DIAGNOSIS — O09892 Supervision of other high risk pregnancies, second trimester: Secondary | ICD-10-CM

## 2020-12-24 NOTE — Progress Notes (Signed)
INITIAL PRENATAL VISIT NOTE  Subjective:  Meghan Bullock is a 30 y.o. [id number] at [redacted]w[redacted]d by 7.2 week uiltrasound being seen today for her initial prenatal visit. This is a unplanned pregnancy.  She was using nothing for birth control previously. She has an obstetric history significant for PROM with delivery at 36 weeks. She has a medical history significant for Hepatitis C, polysubstance abuse with recent overdose this month along with several social issues.    The patient is currently not taking suboxone, subutex or methadone.  Even though she had an overdose 11/29/20, she states the only drugs she is currently using is marijuana.  Patient reports no complaints.  Contractions: Not present. Vag. Bleeding: None.  Movement: Present. Denies leaking of fluid.    Past Medical History:  Diagnosis Date  . Anemia   . Anxiety   . Bipolar 1 disorder (HCC)   . Chlamydia   . Hepatitis    Hepatitis C  . Heroin abuse (HCC)   . Mononucleosis   . Ovarian cyst   . Polysubstance abuse [facility])     Past Surgical History:  Procedure Laterality Date  . HAND SURGERY    . I & D EXTREMITY Left 09/16/2019   Procedure: IRRIGATION AND DEBRIDEMENT EXTREMITY;  Surgeon: Ernest Mallick, MD;  Location: [facility] OR;  Service: Orthopedics;  Laterality: Left;    OB History  Gravida Para Term Preterm AB Living  4 2 1 1 1 2   SAB IAB Ectopic Multiple Live Births  1     0 2    # Outcome Date GA Lbr Len/2nd Weight Sex Delivery Anes PTL Lv  4 Current           3 Term 06/15/18 [redacted]w[redacted]d / 00:07 8 lb 3.2 oz (3.72 kg) M Vag-Spont EPI  LIV  2 Preterm 05/13/14 [redacted]w[redacted]d 35:35 / 00:40 7 lb 3.2 oz (3.265 kg) M Vag-Spont EPI  LIV     Birth Comments: WNL  1 SAB              Birth Comments: System Generated. Please review and update pregnancy details.    Social History   Socioeconomic History  . Marital status: Legally Separated    Spouse name: Not on file  . Number of children: Not on file  . Years of education: Not on  file  . Highest education level: Not on file  Occupational History  . Not on file  Tobacco Use  . Smoking status: Current Every Day Smoker    Packs/day: 1.50    Years: 5.00    Pack years: 7.50    Types: Cigarettes  . Smokeless tobacco: Never Used  Vaping Use  . Vaping Use: Never used  Substance and Sexual Activity  . Alcohol use: No  . Drug use: Yes    Types: Heroin, Marijuana    Comment: fentanyl-heroin 09/29/2020  . Sexual activity: Yes    Birth control/protection: None  Other Topics Concern  . Not on file  Social History Narrative  . Not on file   Social Determinants of Health   Financial Resource Strain: Not on file  Food Insecurity: Not on file  Transportation Needs: Not on file  Physical Activity: Not on file  Stress: Not on file  Social Connections: Not on file    Family History  Problem Relation Age of Onset  . COPD Mother   . Anxiety disorder Mother   . Emphysema Mother      Current Outpatient  Medications:  .  EPINEPHrine 0.3 mg/0.3 mL IJ SOAJ injection, Inject 0.3 mg into the muscle as needed for anaphylaxis. (Patient not taking: Reported on 12/24/2020), Disp: 1 each, Rfl: 0 .  promethazine (PHENERGAN) 25 MG tablet, Take 1 tablet (25 mg total) by mouth every 6 (six) hours as needed for nausea or vomiting. (Patient not taking: Reported on 12/24/2020), Disp: 30 tablet, Rfl: 0  Allergies  Allergen Reactions  . Fentanyl Hives  . Acetaminophen Itching    Review of Systems: Negative except for what is mentioned in HPI.  Objective:   Vitals:   12/24/20 1613  Weight: 165 lb (74.8 kg)    Fetal Status: Fetal Heart Rate (bpm): 164   Movement: Present     Physical Exam: Wt 165 lb (74.8 kg)   LMP 08/08/2020   BMI 28.32 kg/m  CONSTITUTIONAL: Well-developed, well-nourished female in no acute distress.  NEUROLOGIC: Alert and oriented to person, place, and time. Normal reflexes, muscle tone coordination. No cranial nerve deficit noted. PSYCHIATRIC: Normal  mood and affect. Normal behavior. Normal judgment and thought content. SKIN: Skin is warm and dry. No rash noted. Not diaphoretic. No erythema. No pallor. HENT:  Normocephalic, atraumatic, External right and left ear normal. Oropharynx is clear and moist EYES: Conjunctivae and EOM are normal. Diminished lower visual fields in the right eye NECK: Normal range of motion, supple, no masses CARDIOVASCULAR: Normal heart rate noted, regular rhythm RESPIRATORY: Effort and breath sounds normal, no problems with respiration noted BREASTS: symmetric, non-tender, no masses palpable ABDOMEN: Soft, nontender, nondistended, gravid. GU: normal appearing external female genitalia, multiparous normal appearing cervix, scant white discharge in vagina, no lesions noted, pap taken Bimanual: 19 weeks sized uterus, no adnexal tenderness or palpable lesions noted MUSCULOSKELETAL: Normal range of motion. EXT:  No edema and no tenderness. 2+ distal pulses.   Assessment and Plan:  Pregnancy: [id number] at [redacted]w[redacted]d by early ultrasound  1. Chronic hepatitis C without hepatic coma (HCC)  - HCV RNA quant  2. Supervision of high risk pregnancy, antepartum  - Genetic Screening - Obstetric Panel, Including HIV - [place] MFM OB DETAIL +14 WK; Future - Culture, OB Urine - Hemoglobin A1c - [facility] AMB BABYSCRIPTS SCHEDULE OPTIMIZATION - Cytology - PAP( [place])  3. Substance use disorder I have discussed this patient in detail with Dr. Adrian Blackwater due to the polysubstance abuse.  Suboxone therapy is ready to be incorporated, but pt declines the intervention.  The patient will be scheduled an in person meeting with Dr. Crissie Reese, Shawnie Pons or [facility] for further discussion of drug treatment while pregnant. Pt will also be scheduled for behavior health for further counseling and/or outpatient detox treatment.    - Urine drugs of abuse scrn w alc, routine ([facility]) - Ambulatory referral to Integrated Behavioral  Health  4. [redacted] weeks gestation of pregnancy   5. History of preterm delivery, currently pregnant in second trimester Pt delivered after 36 weeks, do not believe she is a candidate for makena  6. Substance abuse affecting pregnancy, antepartum   7. Heroin abuse (HCC)   8. Benzodiazepine abuse (HCC)    Preterm labor symptoms and general obstetric precautions including but not limited to vaginal bleeding, contractions, leaking of fluid and fetal movement were reviewed in detail with the patient.  Please refer to After Visit Summary for other counseling recommendations.   Return in about 4 weeks (around 01/21/2021) for [facility], in person. Substance abuse appointment in 1-2 weeks  Warden Fillers 12/24/2020  5:38 PM

## 2020-12-24 NOTE — Patient Instructions (Signed)
Second Trimester of Pregnancy  The second trimester of pregnancy is from week 13 through week 27. This is also called months 4 through 6 of pregnancy. This is often the time when you feel your best. During the second trimester:  Morning sickness is less or has stopped.  You may have more energy.  You may feel hungry more often. At this time, your unborn baby (fetus) is growing very fast. At the end of the sixth month, the unborn baby may be up to 12 inches long and weigh about 1 pounds. You will likely start to feel the baby move between 16 and 20 weeks of pregnancy. Body changes during your second trimester Your body continues to go through many changes during this time. The changes vary and generally return to normal after the baby is born. Physical changes  You will gain more weight.  You may start to get stretch marks on your hips, belly (abdomen), and breasts.  Your breasts will grow and may hurt.  Dark spots or blotches may develop on your face.  A dark line from your belly button to the pubic area (linea nigra) may appear.  You may have changes in your hair. Health changes  You may have headaches.  You may have heartburn.  You may have trouble pooping (constipation).  You may have hemorrhoids or swollen, bulging veins (varicose veins).  Your gums may bleed.  You may pee (urinate) more often.  You may have back pain. Follow these instructions at home: Medicines  Take over-the-counter and prescription medicines only as told by your doctor. Some medicines are not safe during pregnancy.  Take a prenatal vitamin that contains at least 600 micrograms (mcg) of folic acid. Eating and drinking  Eat healthy meals that include: ? Fresh fruits and vegetables. ? Whole grains. ? Good sources of protein, such as meat, eggs, or tofu. ? Low-fat dairy products.  Avoid raw meat and unpasteurized juice, milk, and cheese.  You may need to take these actions to prevent or  treat trouble pooping: ? Drink enough fluids to keep your pee (urine) pale yellow. ? Eat foods that are high in fiber. These include beans, whole grains, and fresh fruits and vegetables. ? Limit foods that are high in fat and sugar. These include fried or sweet foods. Activity  Exercise only as told by your doctor. Most people can do their usual exercise during pregnancy. Try to exercise for 30 minutes at least 5 days a week.  Stop exercising if you have pain or cramps in your belly or lower back.  Do not exercise if it is too hot or too humid, or if you are in a place of great height (high altitude).  Avoid heavy lifting.  If you choose to, you may have sex unless your doctor tells you not to. Relieving pain and discomfort  Wear a good support bra if your breasts are sore.  Take warm water baths (sitz baths) to soothe pain or discomfort caused by hemorrhoids. Use hemorrhoid cream if your doctor approves.  Rest with your legs raised (elevated) if you have leg cramps or low back pain.  If you develop bulging veins in your legs: ? Wear support hose as told by your doctor. ? Raise your feet for 15 minutes, 3-4 times a day. ? Limit salt in your food. Safety  Wear your seat belt at all times when you are in a car.  Talk with your doctor if someone is hurting you or yelling   at you a lot. Lifestyle  Do not use hot tubs, steam rooms, or saunas.  Do not douche. Do not use tampons or scented sanitary pads.  Avoid cat litter boxes and soil used by cats. These carry germs that can harm your baby and can cause a loss of your baby by miscarriage or stillbirth.  Do not use herbal medicines, illegal drugs, or medicines that are not approved by your doctor. Do not drink alcohol.  Do not smoke or use any products that contain nicotine or tobacco. If you need help quitting, ask your doctor. General instructions  Keep all follow-up visits. This is important.  Ask your doctor about local  prenatal classes.  Ask your doctor about the right foods to eat or for help finding a counselor. Where to find more information  American Pregnancy Association: americanpregnancy.org  American College of Obstetricians and Gynecologists: www.acog.org  Office on Women's Health: womenshealth.gov/pregnancy Contact a doctor if:  You have a headache that does not go away when you take medicine.  You have changes in how you see, or you see spots in front of your eyes.  You have mild cramps, pressure, or pain in your lower belly.  You continue to feel like you may vomit (nauseous), you vomit, or you have watery poop (diarrhea).  You have bad-smelling fluid coming from your vagina.  You have pain when you pee or your pee smells bad.  You have very bad swelling of your face, hands, ankles, feet, or legs.  You have a fever. Get help right away if:  You are leaking fluid from your vagina.  You have spotting or bleeding from your vagina.  You have very bad belly cramping or pain.  You have trouble breathing.  You have chest pain.  You faint.  You have not felt your baby move for the time period told by your doctor.  You have new or increased pain, swelling, or redness in an arm or leg. Summary  The second trimester of pregnancy is from week 13 through week 27 (months 4 through 6).  Eat healthy meals.  Exercise as told by your doctor. Most people can do their usual exercise during pregnancy.  Do not use herbal medicines, illegal drugs, or medicines that are not approved by your doctor. Do not drink alcohol.  Call your doctor if you get sick or if you notice anything unusual about your pregnancy. This information is not intended to replace advice given to you by your health care provider. Make sure you discuss any questions you have with your health care provider. Document Revised: 04/22/2020 Document Reviewed: 02/27/2020 Elsevier Patient Education  2021 Elsevier Inc.  

## 2020-12-25 LAB — OBSTETRIC PANEL, INCLUDING HIV
Antibody Screen: NEGATIVE
Basophils Absolute: 0.1 10*3/uL (ref 0.0–0.2)
Basos: 0 %
EOS (ABSOLUTE): 0.1 10*3/uL (ref 0.0–0.4)
Eos: 1 %
HIV Screen 4th Generation wRfx: NONREACTIVE
Hematocrit: 33.9 % — ABNORMAL LOW (ref 34.0–46.6)
Hemoglobin: 10.6 g/dL — ABNORMAL LOW (ref 11.1–15.9)
Hepatitis B Surface Ag: NEGATIVE
Immature Grans (Abs): 0.1 10*3/uL (ref 0.0–0.1)
Immature Granulocytes: 1 %
Lymphocytes Absolute: 2.3 10*3/uL (ref 0.7–3.1)
Lymphs: 19 %
MCH: 23.5 pg — ABNORMAL LOW (ref 26.6–33.0)
MCHC: 31.3 g/dL — ABNORMAL LOW (ref 31.5–35.7)
MCV: 75 fL — ABNORMAL LOW (ref 79–97)
Monocytes Absolute: 0.8 10*3/uL (ref 0.1–0.9)
Monocytes: 7 %
Neutrophils Absolute: 8.8 10*3/uL — ABNORMAL HIGH (ref 1.4–7.0)
Neutrophils: 72 %
Platelets: 363 10*3/uL (ref 150–450)
RBC: 4.52 x10E6/uL (ref 3.77–5.28)
RDW: 17.4 % — ABNORMAL HIGH (ref 11.7–15.4)
RPR Ser Ql: NONREACTIVE
Rh Factor: POSITIVE
Rubella Antibodies, IGG: 5.33 index (ref >0.99)
WBC: 12.2 10*3/uL — ABNORMAL HIGH (ref 3.4–10.8)

## 2020-12-25 LAB — HCV RNA QUANT: Hepatitis C Quantitation: NOT DETECTED IU/mL

## 2020-12-25 LAB — HEMOGLOBIN A1C
Est. average glucose Bld gHb Est-mCnc: 105 mg/dL
Hgb A1c MFr Bld: 5.3 % (ref 4.8–5.6)

## 2020-12-26 LAB — CULTURE, OB URINE

## 2020-12-26 LAB — URINE CULTURE, OB REFLEX

## 2020-12-29 LAB — CYTOLOGY - PAP
Chlamydia: NEGATIVE
Comment: NEGATIVE
Comment: NEGATIVE
Comment: NORMAL
Diagnosis: UNDETERMINED — AB
High risk HPV: POSITIVE — AB
Neisseria Gonorrhea: NEGATIVE

## 2020-12-31 ENCOUNTER — Encounter: Payer: Self-pay | Admitting: Family Medicine

## 2021-01-01 ENCOUNTER — Telehealth: Payer: Self-pay

## 2021-01-01 NOTE — Telephone Encounter (Addendum)
-----   Message from Warden Fillers, MD sent at 12/30/2020 12:01 PM EST ----- ASCUS pap with high risk HPV, pt needs colpo  Pt notified of abnormal pap smear and the need to f/u with a colpo.  Colpo explained to the pt and that the provider may decide to do it at her visit on 01/05/21.  Pt verbalized understanding with no further questions.   Meghan Bullock  01/01/21

## 2021-01-05 ENCOUNTER — Ambulatory Visit (INDEPENDENT_AMBULATORY_CARE_PROVIDER_SITE_OTHER): Payer: Self-pay | Admitting: Family Medicine

## 2021-01-05 ENCOUNTER — Other Ambulatory Visit: Payer: Self-pay

## 2021-01-05 ENCOUNTER — Encounter: Payer: Self-pay | Admitting: Family Medicine

## 2021-01-05 VITALS — BP 118/75 | HR 89 | Wt 162.9 lb

## 2021-01-05 DIAGNOSIS — O99320 Drug use complicating pregnancy, unspecified trimester: Secondary | ICD-10-CM

## 2021-01-05 DIAGNOSIS — O0990 Supervision of high risk pregnancy, unspecified, unspecified trimester: Secondary | ICD-10-CM

## 2021-01-05 DIAGNOSIS — B182 Chronic viral hepatitis C: Secondary | ICD-10-CM

## 2021-01-05 DIAGNOSIS — O09892 Supervision of other high risk pregnancies, second trimester: Secondary | ICD-10-CM

## 2021-01-05 MED ORDER — BUPRENORPHINE HCL-NALOXONE HCL 2-0.5 MG SL FILM: 2.0000 mg | ORAL_FILM | Freq: Four times a day (QID) | SUBLINGUAL | 0 refills | Status: DC | PRN

## 2021-01-05 NOTE — Progress Notes (Signed)
Subjective:  Meghan Bullock is a 30 y.o. B7S2831 at [redacted]w[redacted]d being seen today for ongoing prenatal care.  She is currently monitored for the following issues for this high-risk pregnancy and has Heroin abuse (HCC); Encephalopathy, toxic; Benzodiazepine abuse (HCC); Preterm premature rupture of membranes (PPROM) with unknown onset of labor; Hepatitis C; Substance abuse affecting pregnancy, antepartum; History of preterm delivery, currently pregnant in second trimester; Iron deficiency anemia; Encounter for induction of labor; Normal labor; Postpartum care following vaginal delivery; Cellulitis and abscess of hand; 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HCC); Abscess of wrist; Substance use disorder; [redacted] weeks gestation of pregnancy; and Supervision of high risk pregnancy, antepartum on their problem list.  Patient reports no complaints.  Contractions: Not present. Vag. Bleeding: None.  Movement: Present. Denies leaking of fluid.   The following portions of the patient's history were reviewed and updated as appropriate: allergies, current medications, past family history, past medical history, past social history, past surgical history and problem list. Problem list updated.  Objective:   Vitals:   01/05/21 1623  BP: 118/75  Pulse: 89  Weight: 162 lb 14.4 oz (73.9 kg)    Fetal Status: Fetal Heart Rate (bpm): 155   Movement: Present     General:  Alert, oriented and cooperative. Patient is in no acute distress.  Skin: Skin is warm and dry. No rash noted.   Cardiovascular: Normal heart rate noted  Respiratory: Normal respiratory effort, no problems with respiration noted  Abdomen: Soft, gravid, appropriate for gestational age. Pain/Pressure: Present     Pelvic: Vag. Bleeding: None     Cervical exam deferred        Extremities: Normal range of motion.  Edema: None  Mental Status: Normal mood and affect. Normal behavior. Normal judgment and thought content.   Urinalysis:      Assessment and Plan:  Pregnancy:  D1V6160 at [redacted]w[redacted]d  1. Supervision of high risk pregnancy, antepartum FHR and BP normal  2. Substance abuse affecting pregnancy, antepartum Patient scheduled with me to discuss opioid use disorder, other pregnancy issues not addressed at this visit Reports that she had been abstinent from substances late last year but then had a relapse and a near fatal overdose on new years Since that time she has again been abstinent and is hesitant to use suboxone as she does not want to switch one drug for another Long discussion had with patient regarding evidence around substance use disorder and pregnancy, with emphasis on the positive outcomes associated with MAT compared to abstinence only After discussion patient is amenable to starting suboxone She is concerned about covering the cost, she will need to activate pregnancy medicaid first before picking up the medication Instructed on home induction, can take 2mg  film every hour up to 8mg  total in one day, given rx for 28 films and instructed to bring meds to next visit I will see her back in one week to re-assess, encouraged her to call or message if she has any issues Planning to return with her mother to next visit as she feels she does not understand her struggle with OUD or the importance/rationale of MAT, I encouraged her to do so  3. History of preterm delivery, currently pregnant in second trimester Not on makena, PPROM w delivery at 36 weeks per chart review  4. Chronic hepatitis C without hepatic coma (HCC) Viral load negative  Preterm labor symptoms and general obstetric precautions including but not limited to vaginal bleeding, contractions, leaking of fluid and fetal  movement were reviewed in detail with the patient. Please refer to After Visit Summary for other counseling recommendations.  Return in 2 weeks (on 01/19/2021) for [facility], ob visit, needs MD.   Venora Maples, MD

## 2021-01-05 NOTE — Patient Instructions (Signed)
 Contraception Choices Contraception, also called birth control, refers to methods or devices that prevent pregnancy. Hormonal methods Contraceptive implant A contraceptive implant is a thin, plastic tube that contains a hormone that prevents pregnancy. It is different from an intrauterine device (IUD). It is inserted into the upper part of the arm by a health care provider. Implants can be effective for up to 3 years. Progestin-only injections Progestin-only injections are injections of progestin, a synthetic form of the hormone progesterone. They are given every 3 months by a health care provider. Birth control pills Birth control pills are pills that contain hormones that prevent pregnancy. They must be taken once a day, preferably at the same time each day. A prescription is needed to use this method of contraception. Birth control patch The birth control patch contains hormones that prevent pregnancy. It is placed on the skin and must be changed once a week for three weeks and removed on the fourth week. A prescription is needed to use this method of contraception. Vaginal ring A vaginal ring contains hormones that prevent pregnancy. It is placed in the vagina for three weeks and removed on the fourth week. After that, the process is repeated with a new ring. A prescription is needed to use this method of contraception. Emergency contraceptive Emergency contraceptives prevent pregnancy after unprotected sex. They come in pill form and can be taken up to 5 days after sex. They work best the sooner they are taken after having sex. Most emergency contraceptives are available without a prescription. This method should not be used as your only form of birth control.   Barrier methods Female condom A female condom is a thin sheath that is worn over the penis during sex. Condoms keep sperm from going inside a woman's body. They can be used with a sperm-killing substance (spermicide) to increase their  effectiveness. They should be thrown away after one use. Female condom A female condom is a soft, loose-fitting sheath that is put into the vagina before sex. The condom keeps sperm from going inside a woman's body. They should be thrown away after one use. Diaphragm A diaphragm is a soft, dome-shaped barrier. It is inserted into the vagina before sex, along with a spermicide. The diaphragm blocks sperm from entering the uterus, and the spermicide kills sperm. A diaphragm should be left in the vagina for 6-8 hours after sex and removed within 24 hours. A diaphragm is prescribed and fitted by a health care provider. A diaphragm should be replaced every 1-2 years, after giving birth, after gaining more than 15 lb (6.8 kg), and after pelvic surgery. Cervical cap A cervical cap is a round, soft latex or plastic cup that fits over the cervix. It is inserted into the vagina before sex, along with spermicide. It blocks sperm from entering the uterus. The cap should be left in place for 6-8 hours after sex and removed within 48 hours. A cervical cap must be prescribed and fitted by a health care provider. It should be replaced every 2 years. Sponge A sponge is a soft, circular piece of polyurethane foam with spermicide in it. The sponge helps block sperm from entering the uterus, and the spermicide kills sperm. To use it, you make it wet and then insert it into the vagina. It should be inserted before sex, left in for at least 6 hours after sex, and removed and thrown away within 30 hours. Spermicides Spermicides are chemicals that kill or block sperm from entering the   cervix and uterus. They can come as a cream, jelly, suppository, foam, or tablet. A spermicide should be inserted into the vagina with an applicator at least 10-15 minutes before sex to allow time for it to work. The process must be repeated every time you have sex. Spermicides do not require a prescription.   Intrauterine  contraception Intrauterine device (IUD) An IUD is a T-shaped device that is put in a woman's uterus. There are two types:  Hormone IUD.This type contains progestin, a synthetic form of the hormone progesterone. This type can stay in place for 3-5 years.  Copper IUD.This type is wrapped in copper wire. It can stay in place for 10 years. Permanent methods of contraception Female tubal ligation In this method, a woman's fallopian tubes are sealed, tied, or blocked during surgery to prevent eggs from traveling to the uterus. Hysteroscopic sterilization In this method, a small, flexible insert is placed into each fallopian tube. The inserts cause scar tissue to form in the fallopian tubes and block them, so sperm cannot reach an egg. The procedure takes about 3 months to be effective. Another form of birth control must be used during those 3 months. Female sterilization This is a procedure to tie off the tubes that carry sperm (vasectomy). After the procedure, the man can still ejaculate fluid (semen). Another form of birth control must be used for 3 months after the procedure. Natural planning methods Natural family planning In this method, a couple does not have sex on days when the woman could become pregnant. Calendar method In this method, the woman keeps track of the length of each menstrual cycle, identifies the days when pregnancy can happen, and does not have sex on those days. Ovulation method In this method, a couple avoids sex during ovulation. Symptothermal method This method involves not having sex during ovulation. The woman typically checks for ovulation by watching changes in her temperature and in the consistency of cervical mucus. Post-ovulation method In this method, a couple waits to have sex until after ovulation. Where to find more information  Centers for Disease Control and Prevention: www.cdc.gov Summary  Contraception, also called birth control, refers to methods or  devices that prevent pregnancy.  Hormonal methods of contraception include implants, injections, pills, patches, vaginal rings, and emergency contraceptives.  Barrier methods of contraception can include female condoms, female condoms, diaphragms, cervical caps, sponges, and spermicides.  There are two types of IUDs (intrauterine devices). An IUD can be put in a woman's uterus to prevent pregnancy for 3-5 years.  Permanent sterilization can be done through a procedure for males and females. Natural family planning methods involve nothaving sex on days when the woman could become pregnant. This information is not intended to replace advice given to you by your health care provider. Make sure you discuss any questions you have with your health care provider. Document Revised: 04/20/2020 Document Reviewed: 04/20/2020 Elsevier Patient Education  2021 Else[employer] Inc.   Breastfeeding  Choosing to breastfeed is one of the best decisions you can make for yourself and your baby. A change in hormones during pregnancy causes your breasts to make breast milk in your milk-producing glands. Hormones prevent breast milk from being released before your baby is born. They also prompt milk flow after birth. Once breastfeeding has begun, thoughts of your baby, as well as his or her sucking or crying, can stimulate the release of milk from your milk-producing glands. Benefits of breastfeeding Research shows that breastfeeding offers many health benefits   for infants and mothers. It also offers a cost-free and convenient way to feed your baby. For your baby  Your first milk (colostrum) helps your baby's digestive system to function better.  Special cells in your milk (antibodies) help your baby to fight off infections.  Breastfed babies are less likely to develop asthma, allergies, obesity, or type 2 diabetes. They are also at lower risk for sudden infant death syndrome (SIDS).  Nutrients in breast milk are better  able to meet your baby's needs compared to infant formula.  Breast milk improves your baby's brain development. For you  Breastfeeding helps to create a very special bond between you and your baby.  Breastfeeding is convenient. Breast milk costs nothing and is always available at the correct temperature.  Breastfeeding helps to burn calories. It helps you to lose the weight that you gained during pregnancy.  Breastfeeding makes your uterus return faster to its size before pregnancy. It also slows bleeding (lochia) after you give birth.  Breastfeeding helps to lower your risk of developing type 2 diabetes, osteoporosis, rheumatoid arthritis, cardiovascular disease, and breast, ovarian, uterine, and endometrial cancer later in life. Breastfeeding basics Starting breastfeeding  Find a comfortable place to sit or lie down, with your neck and back well-supported.  Place a pillow or a rolled-up blanket under your baby to bring him or her to the level of your breast (if you are seated). Nursing pillows are specially designed to help support your arms and your baby while you breastfeed.  Make sure that your baby's tummy (abdomen) is facing your abdomen.  Gently massage your breast. With your fingertips, massage from the outer edges of your breast inward toward the nipple. This encourages milk flow. If your milk flows slowly, you may need to continue this action during the feeding.  Support your breast with 4 fingers underneath and your thumb above your nipple (make the letter "C" with your hand). Make sure your fingers are well away from your nipple and your baby's mouth.  Stroke your baby's lips gently with your finger or nipple.  When your baby's mouth is open wide enough, quickly bring your baby to your breast, placing your entire nipple and as much of the areola as possible into your baby's mouth. The areola is the colored area around your nipple. ? More areola should be visible above your  baby's upper lip than below the lower lip. ? Your baby's lips should be opened and extended outward (flanged) to ensure an adequate, comfortable latch. ? Your baby's tongue should be between his or her lower gum and your breast.  Make sure that your baby's mouth is correctly positioned around your nipple (latched). Your baby's lips should create a seal on your breast and be turned out (everted).  It is common for your baby to suck about 2-3 minutes in order to start the flow of breast milk. Latching Teaching your baby how to latch onto your breast properly is very important. An improper latch can cause nipple pain, decreased milk supply, and poor weight gain in your baby. Also, if your baby is not latched onto your nipple properly, he or she may swallow some air during feeding. This can make your baby fussy. Burping your baby when you switch breasts during the feeding can help to get rid of the air. However, teaching your baby to latch on properly is still the best way to prevent fussiness from swallowing air while breastfeeding. Signs that your baby has successfully latched onto   your nipple  Silent tugging or silent sucking, without causing you pain. Infant's lips should be extended outward (flanged).  Swallowing heard between every 3-4 sucks once your milk has started to flow (after your let-down milk reflex occurs).  Muscle movement above and in front of his or her ears while sucking. Signs that your baby has not successfully latched onto your nipple  Sucking sounds or smacking sounds from your baby while breastfeeding.  Nipple pain. If you think your baby has not latched on correctly, slip your finger into the corner of your baby's mouth to break the suction and place it between your baby's gums. Attempt to start breastfeeding again. Signs of successful breastfeeding Signs from your baby  Your baby will gradually decrease the number of sucks or will completely stop sucking.  Your baby  will fall asleep.  Your baby's body will relax.  Your baby will retain a small amount of milk in his or her mouth.  Your baby will let go of your breast by himself or herself. Signs from you  Breasts that have increased in firmness, weight, and size 1-3 hours after feeding.  Breasts that are softer immediately after breastfeeding.  Increased milk volume, as well as a change in milk consistency and color by the fifth day of breastfeeding.  Nipples that are not sore, cracked, or bleeding. Signs that your baby is getting enough milk  Wetting at least 1-2 diapers during the first 24 hours after birth.  Wetting at least 5-6 diapers every 24 hours for the first week after birth. The urine should be clear or pale yellow by the age of 5 days.  Wetting 6-8 diapers every 24 hours as your baby continues to grow and develop.  At least 3 stools in a 24-hour period by the age of 5 days. The stool should be soft and yellow.  At least 3 stools in a 24-hour period by the age of 7 days. The stool should be seedy and yellow.  No loss of weight greater than 10% of birth weight during the first 3 days of life.  Average weight gain of 4-7 oz (113-198 g) per week after the age of 4 days.  Consistent daily weight gain by the age of 5 days, without weight loss after the age of 2 weeks. After a feeding, your baby may spit up a small amount of milk. This is normal. Breastfeeding frequency and duration Frequent feeding will help you make more milk and can prevent sore nipples and extremely full breasts (breast engorgement). Breastfeed when you feel the need to reduce the fullness of your breasts or when your baby shows signs of hunger. This is called "breastfeeding on demand." Signs that your baby is hungry include:  Increased alertness, activity, or restlessness.  Movement of the head from side to side.  Opening of the mouth when the corner of the mouth or cheek is stroked (rooting).  Increased  sucking sounds, smacking lips, cooing, sighing, or squeaking.  Hand-to-mouth movements and sucking on fingers or hands.  Fussing or crying. Avoid introducing a pacifier to your baby in the first 4-6 weeks after your baby is born. After this time, you may choose to use a pacifier. Research has shown that pacifier use during the first year of a baby's life decreases the risk of sudden infant death syndrome (SIDS). Allow your baby to feed on each breast as long as he or she wants. When your baby unlatches or falls asleep while feeding from the   first breast, offer the second breast. Because newborns are often sleepy in the first few weeks of life, you may need to awaken your baby to get him or her to feed. Breastfeeding times will vary from baby to baby. However, the following rules can serve as a guide to help you make sure that your baby is properly fed:  Newborns (babies 4 weeks of age or younger) may breastfeed every 1-3 hours.  Newborns should not go without breastfeeding for longer than 3 hours during the day or 5 hours during the night.  You should breastfeed your baby a minimum of 8 times in a 24-hour period. Breast milk pumping Pumping and storing breast milk allows you to make sure that your baby is exclusively fed your breast milk, even at times when you are unable to breastfeed. This is especially important if you go back to work while you are still breastfeeding, or if you are not able to be present during feedings. Your lactation consultant can help you find a method of pumping that works best for you and give you guidelines about how long it is safe to store breast milk.      Caring for your breasts while you breastfeed Nipples can become dry, cracked, and sore while breastfeeding. The following recommendations can help keep your breasts moisturized and healthy:  Avoid using soap on your nipples.  Wear a supportive bra designed especially for nursing. Avoid wearing underwire-style  bras or extremely tight bras (sports bras).  Air-dry your nipples for 3-4 minutes after each feeding.  Use only cotton bra pads to absorb leaked breast milk. Leaking of breast milk between feedings is normal.  Use lanolin on your nipples after breastfeeding. Lanolin helps to maintain your skin's normal moisture barrier. Pure lanolin is not harmful (not toxic) to your baby. You may also hand express a few drops of breast milk and gently massage that milk into your nipples and allow the milk to air-dry. In the first few weeks after giving birth, some women experience breast engorgement. Engorgement can make your breasts feel heavy, warm, and tender to the touch. Engorgement peaks within 3-5 days after you give birth. The following recommendations can help to ease engorgement:  Completely empty your breasts while breastfeeding or pumping. You may want to start by applying warm, moist heat (in the shower or with warm, water-soaked hand towels) just before feeding or pumping. This increases circulation and helps the milk flow. If your baby does not completely empty your breasts while breastfeeding, pump any extra milk after he or she is finished.  Apply ice packs to your breasts immediately after breastfeeding or pumping, unless this is too uncomfortable for you. To do this: ? Put ice in a plastic bag. ? Place a towel between your skin and the bag. ? Leave the ice on for 20 minutes, 2-3 times a day.  Make sure that your baby is latched on and positioned properly while breastfeeding. If engorgement persists after 48 hours of following these recommendations, contact your health care provider or a lactation consultant. Overall health care recommendations while breastfeeding  Eat 3 healthy meals and 3 snacks every day. Well-nourished mothers who are breastfeeding need an additional 450-500 calories a day. You can meet this requirement by increasing the amount of a balanced diet that you eat.  Drink  enough water to keep your urine pale yellow or clear.  Rest often, relax, and continue to take your prenatal vitamins to prevent fatigue, stress, and low   vitamin and mineral levels in your body (nutrient deficiencies).  Do not use any products that contain nicotine or tobacco, such as cigarettes and e-cigarettes. Your baby may be harmed by chemicals from cigarettes that pass into breast milk and exposure to secondhand smoke. If you need help quitting, ask your health care provider.  Avoid alcohol.  Do not use illegal drugs or marijuana.  Talk with your health care provider before taking any medicines. These include over-the-counter and prescription medicines as well as vitamins and herbal supplements. Some medicines that may be harmful to your baby can pass through breast milk.  It is possible to become pregnant while breastfeeding. If birth control is desired, ask your health care provider about options that will be safe while breastfeeding your baby. Where to find more information: [employer]: www.llli.org Contact a health care provider if:  You feel like you want to stop breastfeeding or have become frustrated with breastfeeding.  Your nipples are cracked or bleeding.  Your breasts are red, tender, or warm.  You have: ? Painful breasts or nipples. ? A swollen area on either breast. ? A fever or chills. ? Nausea or vomiting. ? Drainage other than breast milk from your nipples.  Your breasts do not become full before feedings by the fifth day after you give birth.  You feel sad and depressed.  Your baby is: ? Too sleepy to eat well. ? Having trouble sleeping. ? More than 1 week old and wetting fewer than 6 diapers in a 24-hour period. ? Not gaining weight by 5 days of age.  Your baby has fewer than 3 stools in a 24-hour period.  Your baby's skin or the white parts of his or her eyes become yellow. Get help right away if:  Your baby is overly tired  (lethargic) and does not want to wake up and feed.  Your baby develops an unexplained fever. Summary  Breastfeeding offers many health benefits for infant and mothers.  Try to breastfeed your infant when he or she shows early signs of hunger.  Gently tickle or stroke your baby's lips with your finger or nipple to allow the baby to open his or her mouth. Bring the baby to your breast. Make sure that much of the areola is in your baby's mouth. Offer one side and burp the baby before you offer the other side.  Talk with your health care provider or lactation consultant if you have questions or you face problems as you breastfeed. This information is not intended to replace advice given to you by your health care provider. Make sure you discuss any questions you have with your health care provider. Document Revised: 02/08/2018 Document Reviewed: 12/16/2016 Elsevier Patient Education  2021 Elsevier Inc.  

## 2021-01-08 ENCOUNTER — Ambulatory Visit: Payer: MEDICAID

## 2021-01-08 ENCOUNTER — Other Ambulatory Visit: Payer: Self-pay | Admitting: *Deleted

## 2021-01-08 ENCOUNTER — Ambulatory Visit: Payer: Medicaid Other | Attending: Obstetrics and Gynecology

## 2021-01-08 ENCOUNTER — Ambulatory Visit: Payer: Medicaid Other | Admitting: *Deleted

## 2021-01-08 ENCOUNTER — Encounter: Payer: Self-pay | Admitting: *Deleted

## 2021-01-08 ENCOUNTER — Other Ambulatory Visit: Payer: Self-pay

## 2021-01-08 DIAGNOSIS — O0990 Supervision of high risk pregnancy, unspecified, unspecified trimester: Secondary | ICD-10-CM

## 2021-01-08 DIAGNOSIS — F1120 Opioid dependence, uncomplicated: Secondary | ICD-10-CM

## 2021-01-08 DIAGNOSIS — O99320 Drug use complicating pregnancy, unspecified trimester: Secondary | ICD-10-CM

## 2021-01-09 LAB — URINE DRUGS OF ABUSE SCREEN W ALC, ROUTINE (REF LAB)
Amphetamines, Urine: NEGATIVE ng/mL
Barbiturate Quant, Ur: NEGATIVE ng/mL
Cocaine (Metab.): NEGATIVE ng/mL
Ethanol, Urine: NEGATIVE %
Methadone Screen, Urine: NEGATIVE ng/mL
Opiate Quant, Ur: NEGATIVE ng/mL
PCP Quant, Ur: NEGATIVE ng/mL
Propoxyphene: NEGATIVE ng/mL

## 2021-01-09 LAB — DRUG PROFILE 799031
BENZODIAZEPINES: POSITIVE — AB
HYDROXYALPRAZOLAM: NEGATIVE
NORDIAZEPAM: NEGATIVE
Oxazepam GC/MS Conf: 594 ng/mL
Oxazepam: POSITIVE — AB

## 2021-01-09 LAB — PANEL 799049
CARBOXY THC GC/MS CONF: 522 ng/mL
Cannabinoid GC/MS, Ur: POSITIVE — AB

## 2021-01-11 LAB — BUPRENORPHINE + DRUG SCREEN, UR
6-Acetylmorphine: NEGATIVE ng/mL
Amphetamines: NEGATIVE ng/mL
Barbiturates: NEGATIVE ng/mL
Benzodiazepines: POSITIVE ng/mL — AB
Buprenorphine: NEGATIVE ng/mL
Carisoprodol: NEGATIVE ng/mL
Cocaine Metabolite: NEGATIVE ng/mL
Creatinine: 129 mg/dL (ref >=20)
Ethyl Glucuronide: NEGATIVE ng/mL
Fentanyl: NEGATIVE ng/mL
Gabapentin: 370 ug/mL — ABNORMAL HIGH
Marijuana MTB (THC): POSITIVE ng/mL — AB
Methadone: NEGATIVE ng/mL
Nitrites: NEGATIVE ug/mL (ref <200)
Opiates: NEGATIVE ng/mL
Oxycodone: NEGATIVE ng/mL
Phencyclidine (PCP): NEGATIVE ng/mL
Propoxyphene: NEGATIVE ng/mL
Tapentadol: NEGATIVE ng/mL
Tramadol: NEGATIVE ng/mL
Urine pH: 6.2 (ref 4.5–8.9)

## 2021-01-11 LAB — CARBOXY-THC NORMALIZED RATIO
Carboxy-THC: 432 ng/mL
THC/CR Ratio: 335 ng/mg creat

## 2021-01-11 LAB — EXPANDED BENZODIAZEPINE CONF
7-Aminoclonazepam: NEGATIVE ng/mL
Alpha-Hydroxyalprazolam: NEGATIVE ng/mL
Alpha-Hydrozymidazolam: NEGATIVE ng/mL
Alpha-Hydrozytriazolam: NEGATIVE ng/mL
Desmethyldiazepam: 210 ng/mL
Hydroxymethylflurazepam: NEGATIVE ng/mL
Lorazepam: NEGATIVE ng/mL
Oxazepam: 308 ng/mL
Temazepam: 675 ng/mL

## 2021-01-12 ENCOUNTER — Telehealth: Payer: Self-pay | Admitting: Family Medicine

## 2021-01-12 ENCOUNTER — Encounter: Payer: Self-pay | Admitting: Family Medicine

## 2021-01-12 NOTE — Telephone Encounter (Signed)
Spoke to [employer] about patient missing her appointment today, He said he will give the patient a call today

## 2021-01-14 NOTE — BH Specialist Note (Deleted)
Integrated Behavioral Health via Telemedicine Visit  01/14/2021 Meghan Bullock [id number]  Number of Integrated Behavioral Health visits: *** Session Start time: 3:45***  Session End time: 4:45*** Total time: {IBH Total Time:21014050}  Referring Provider: *** Patient/Family location: Home*** [facility] Provider location: [facility]  All persons participating in visit: Patient *** and [facility] Jamie McMannes ***  Types of Service: {CHL AMB TYPE OF SERVICE:[phone]}  I connected with Meghan Bullock and/or Meghan Bullock's {family members:20773} by Telephone  (Video is Caregility application) and verified that I am speaking with the correct person using two identifiers.Discussed confidentiality: {YES/NO:21197}  I discussed the limitations of telemedicine and the availability of in person appointments.  Discussed there is a possibility of technology failure and discussed alternative modes of communication if that failure occurs.  I discussed that engaging in this telemedicine visit, they consent to the provision of behavioral healthcare and the services will be billed under their insurance.  Patient and/or legal guardian expressed understanding and consented to Telemedicine visit: {YES/NO:21197}  Presenting Concerns: Patient and/or family reports the following symptoms/concerns: *** Duration of problem: ***; Severity of problem: {Mild/Moderate/Severe:20260}  Patient and/or Family's Strengths/Protective Factors: {CHL AMB BH PROTECTIVE FACTORS:[phone]}  Goals Addressed: Patient will: 1.  Reduce symptoms of: {IBH Symptoms:21014056}  2.  Increase knowledge and/or ability of: {IBH Patient Tools:21014057}  3.  Demonstrate ability to: {IBH Goals:21014053}  Progress towards Goals: {CHL AMB BH PROGRESS TOWARDS [phone]}  Interventions: Interventions utilized:  {IBH Interventions:21014054} Standardized Assessments completed: {IBH  Screening Tools:21014051}  Patient and/or Family Response: ***  Assessment: Patient currently experiencing ***.   Patient may benefit from ***.  Plan: 1. Follow up with behavioral health clinician on : *** 2. Behavioral recommendations: *** 3. Referral(s): {IBH Referrals:21014055}  I discussed the assessment and treatment plan with the patient and/or parent/guardian. They were provided an opportunity to ask questions and all were answered. They agreed with the plan and demonstrated an understanding of the instructions.   They were advised to call back or seek an in-person evaluation if the symptoms worsen or if the condition fails to improve as anticipated.  Valetta Close McMannes, LCSW

## 2021-01-22 ENCOUNTER — Encounter: Payer: Self-pay | Admitting: Family Medicine

## 2021-01-22 ENCOUNTER — Telehealth (INDEPENDENT_AMBULATORY_CARE_PROVIDER_SITE_OTHER): Payer: Medicaid Other

## 2021-01-22 ENCOUNTER — Encounter: Payer: Self-pay | Admitting: *Deleted

## 2021-01-22 MED ORDER — CITALOPRAM HYDROBROMIDE 20 MG PO TABS: 20.0000 mg | ORAL_TABLET | Freq: Every day | ORAL | 12 refills | Status: DC

## 2021-01-22 MED ORDER — BUPRENORPHINE HCL-NALOXONE HCL 4-1 MG SL FILM: 1.0000 | ORAL_FILM | Freq: Two times a day (BID) | SUBLINGUAL | 0 refills | Status: AC

## 2021-01-22 NOTE — Telephone Encounter (Signed)
Missed appt today. Running out of medication. Has been stretching medication to make it last, but would need to take 8mg  daily - divided doses. Will give suboxone 4mg  BID.  Additionally, having panic attacks. Will trial SSRI. Start celexa 20mg  daily.

## 2021-01-22 NOTE — Telephone Encounter (Signed)
Pt called and left VM on nurse line requesting a call back. Pt states she called office yesterday before closing to let us know she would not be able to make it to appt this AM. Pt is requesting a call back from provider.

## 2021-01-26 ENCOUNTER — Encounter: Payer: Medicaid Other | Admitting: Family Medicine

## 2021-01-27 ENCOUNTER — Encounter: Payer: Medicaid Other | Admitting: Family Medicine

## 2021-01-29 ENCOUNTER — Encounter: Payer: Self-pay | Admitting: *Deleted

## 2021-02-02 ENCOUNTER — Encounter: Payer: Self-pay | Admitting: Family Medicine

## 2021-02-02 ENCOUNTER — Ambulatory Visit (INDEPENDENT_AMBULATORY_CARE_PROVIDER_SITE_OTHER): Payer: Medicaid Other | Admitting: Family Medicine

## 2021-02-02 ENCOUNTER — Other Ambulatory Visit: Payer: Self-pay

## 2021-02-02 VITALS — BP 128/91 | HR 90 | Wt 164.8 lb

## 2021-02-02 DIAGNOSIS — O1403 Mild to moderate pre-eclampsia, third trimester: Secondary | ICD-10-CM | POA: Insufficient documentation

## 2021-02-02 DIAGNOSIS — F419 Anxiety disorder, unspecified: Secondary | ICD-10-CM | POA: Insufficient documentation

## 2021-02-02 DIAGNOSIS — O09892 Supervision of other high risk pregnancies, second trimester: Secondary | ICD-10-CM

## 2021-02-02 DIAGNOSIS — K219 Gastro-esophageal reflux disease without esophagitis: Secondary | ICD-10-CM

## 2021-02-02 DIAGNOSIS — O99619 Diseases of the digestive system complicating pregnancy, unspecified trimester: Secondary | ICD-10-CM

## 2021-02-02 DIAGNOSIS — O1413 Severe pre-eclampsia, third trimester: Secondary | ICD-10-CM | POA: Insufficient documentation

## 2021-02-02 DIAGNOSIS — O0990 Supervision of high risk pregnancy, unspecified, unspecified trimester: Secondary | ICD-10-CM

## 2021-02-02 DIAGNOSIS — B182 Chronic viral hepatitis C: Secondary | ICD-10-CM

## 2021-02-02 DIAGNOSIS — F1990 Other psychoactive substance use, unspecified, uncomplicated: Secondary | ICD-10-CM

## 2021-02-02 DIAGNOSIS — R030 Elevated blood-pressure reading, without diagnosis of hypertension: Secondary | ICD-10-CM

## 2021-02-02 DIAGNOSIS — Z8759 Personal history of other complications of pregnancy, childbirth and the puerperium: Secondary | ICD-10-CM | POA: Insufficient documentation

## 2021-02-02 MED ORDER — ONDANSETRON 4 MG PO TBDP: 4.0000 mg | ORAL_TABLET | Freq: Three times a day (TID) | ORAL | 0 refills | Status: DC | PRN

## 2021-02-02 MED ORDER — PANTOPRAZOLE SODIUM 20 MG PO TBEC: 20.0000 mg | DELAYED_RELEASE_TABLET | Freq: Every day | ORAL | 5 refills | Status: DC

## 2021-02-02 MED ORDER — SERTRALINE HCL 50 MG PO TABS: 50.0000 mg | ORAL_TABLET | Freq: Every day | ORAL | 5 refills | Status: DC

## 2021-02-02 MED ORDER — BUPRENORPHINE HCL-NALOXONE HCL 8-2 MG SL FILM: 1.0000 | ORAL_FILM | Freq: Every day | SUBLINGUAL | 0 refills | Status: AC

## 2021-02-02 NOTE — Patient Instructions (Signed)
 Contraception Choices Contraception, also called birth control, refers to methods or devices that prevent pregnancy. Hormonal methods Contraceptive implant A contraceptive implant is a thin, plastic tube that contains a hormone that prevents pregnancy. It is different from an intrauterine device (IUD). It is inserted into the upper part of the arm by a health care provider. Implants can be effective for up to 3 years. Progestin-only injections Progestin-only injections are injections of progestin, a synthetic form of the hormone progesterone. They are given every 3 months by a health care provider. Birth control pills Birth control pills are pills that contain hormones that prevent pregnancy. They must be taken once a day, preferably at the same time each day. A prescription is needed to use this method of contraception. Birth control patch The birth control patch contains hormones that prevent pregnancy. It is placed on the skin and must be changed once a week for three weeks and removed on the fourth week. A prescription is needed to use this method of contraception. Vaginal ring A vaginal ring contains hormones that prevent pregnancy. It is placed in the vagina for three weeks and removed on the fourth week. After that, the process is repeated with a new ring. A prescription is needed to use this method of contraception. Emergency contraceptive Emergency contraceptives prevent pregnancy after unprotected sex. They come in pill form and can be taken up to 5 days after sex. They work best the sooner they are taken after having sex. Most emergency contraceptives are available without a prescription. This method should not be used as your only form of birth control.   Barrier methods Female condom A female condom is a thin sheath that is worn over the penis during sex. Condoms keep sperm from going inside a woman's body. They can be used with a sperm-killing substance (spermicide) to increase their  effectiveness. They should be thrown away after one use. Female condom A female condom is a soft, loose-fitting sheath that is put into the vagina before sex. The condom keeps sperm from going inside a woman's body. They should be thrown away after one use. Diaphragm A diaphragm is a soft, dome-shaped barrier. It is inserted into the vagina before sex, along with a spermicide. The diaphragm blocks sperm from entering the uterus, and the spermicide kills sperm. A diaphragm should be left in the vagina for 6-8 hours after sex and removed within 24 hours. A diaphragm is prescribed and fitted by a health care provider. A diaphragm should be replaced every 1-2 years, after giving birth, after gaining more than 15 lb (6.8 kg), and after pelvic surgery. Cervical cap A cervical cap is a round, soft latex or plastic cup that fits over the cervix. It is inserted into the vagina before sex, along with spermicide. It blocks sperm from entering the uterus. The cap should be left in place for 6-8 hours after sex and removed within 48 hours. A cervical cap must be prescribed and fitted by a health care provider. It should be replaced every 2 years. Sponge A sponge is a soft, circular piece of polyurethane foam with spermicide in it. The sponge helps block sperm from entering the uterus, and the spermicide kills sperm. To use it, you make it wet and then insert it into the vagina. It should be inserted before sex, left in for at least 6 hours after sex, and removed and thrown away within 30 hours. Spermicides Spermicides are chemicals that kill or block sperm from entering the   cervix and uterus. They can come as a cream, jelly, suppository, foam, or tablet. A spermicide should be inserted into the vagina with an applicator at least 10-15 minutes before sex to allow time for it to work. The process must be repeated every time you have sex. Spermicides do not require a prescription.   Intrauterine  contraception Intrauterine device (IUD) An IUD is a T-shaped device that is put in a woman's uterus. There are two types:  Hormone IUD.This type contains progestin, a synthetic form of the hormone progesterone. This type can stay in place for 3-5 years.  Copper IUD.This type is wrapped in copper wire. It can stay in place for 10 years. Permanent methods of contraception Female tubal ligation In this method, a woman's fallopian tubes are sealed, tied, or blocked during surgery to prevent eggs from traveling to the uterus. Hysteroscopic sterilization In this method, a small, flexible insert is placed into each fallopian tube. The inserts cause scar tissue to form in the fallopian tubes and block them, so sperm cannot reach an egg. The procedure takes about 3 months to be effective. Another form of birth control must be used during those 3 months. Female sterilization This is a procedure to tie off the tubes that carry sperm (vasectomy). After the procedure, the man can still ejaculate fluid (semen). Another form of birth control must be used for 3 months after the procedure. Natural planning methods Natural family planning In this method, a couple does not have sex on days when the woman could become pregnant. Calendar method In this method, the woman keeps track of the length of each menstrual cycle, identifies the days when pregnancy can happen, and does not have sex on those days. Ovulation method In this method, a couple avoids sex during ovulation. Symptothermal method This method involves not having sex during ovulation. The woman typically checks for ovulation by watching changes in her temperature and in the consistency of cervical mucus. Post-ovulation method In this method, a couple waits to have sex until after ovulation. Where to find more information  Centers for Disease Control and Prevention: www.cdc.gov Summary  Contraception, also called birth control, refers to methods or  devices that prevent pregnancy.  Hormonal methods of contraception include implants, injections, pills, patches, vaginal rings, and emergency contraceptives.  Barrier methods of contraception can include female condoms, female condoms, diaphragms, cervical caps, sponges, and spermicides.  There are two types of IUDs (intrauterine devices). An IUD can be put in a woman's uterus to prevent pregnancy for 3-5 years.  Permanent sterilization can be done through a procedure for males and females. Natural family planning methods involve nothaving sex on days when the woman could become pregnant. This information is not intended to replace advice given to you by your health care provider. Make sure you discuss any questions you have with your health care provider. Document Revised: 04/20/2020 Document Reviewed: 04/20/2020 Elsevier Patient Education  2021 Else[employer] Inc.   Breastfeeding  Choosing to breastfeed is one of the best decisions you can make for yourself and your baby. A change in hormones during pregnancy causes your breasts to make breast milk in your milk-producing glands. Hormones prevent breast milk from being released before your baby is born. They also prompt milk flow after birth. Once breastfeeding has begun, thoughts of your baby, as well as his or her sucking or crying, can stimulate the release of milk from your milk-producing glands. Benefits of breastfeeding Research shows that breastfeeding offers many health benefits   for infants and mothers. It also offers a cost-free and convenient way to feed your baby. For your baby  Your first milk (colostrum) helps your baby's digestive system to function better.  Special cells in your milk (antibodies) help your baby to fight off infections.  Breastfed babies are less likely to develop asthma, allergies, obesity, or type 2 diabetes. They are also at lower risk for sudden infant death syndrome (SIDS).  Nutrients in breast milk are better  able to meet your baby's needs compared to infant formula.  Breast milk improves your baby's brain development. For you  Breastfeeding helps to create a very special bond between you and your baby.  Breastfeeding is convenient. Breast milk costs nothing and is always available at the correct temperature.  Breastfeeding helps to burn calories. It helps you to lose the weight that you gained during pregnancy.  Breastfeeding makes your uterus return faster to its size before pregnancy. It also slows bleeding (lochia) after you give birth.  Breastfeeding helps to lower your risk of developing type 2 diabetes, osteoporosis, rheumatoid arthritis, cardiovascular disease, and breast, ovarian, uterine, and endometrial cancer later in life. Breastfeeding basics Starting breastfeeding  Find a comfortable place to sit or lie down, with your neck and back well-supported.  Place a pillow or a rolled-up blanket under your baby to bring him or her to the level of your breast (if you are seated). Nursing pillows are specially designed to help support your arms and your baby while you breastfeed.  Make sure that your baby's tummy (abdomen) is facing your abdomen.  Gently massage your breast. With your fingertips, massage from the outer edges of your breast inward toward the nipple. This encourages milk flow. If your milk flows slowly, you may need to continue this action during the feeding.  Support your breast with 4 fingers underneath and your thumb above your nipple (make the letter "C" with your hand). Make sure your fingers are well away from your nipple and your baby's mouth.  Stroke your baby's lips gently with your finger or nipple.  When your baby's mouth is open wide enough, quickly bring your baby to your breast, placing your entire nipple and as much of the areola as possible into your baby's mouth. The areola is the colored area around your nipple. ? More areola should be visible above your  baby's upper lip than below the lower lip. ? Your baby's lips should be opened and extended outward (flanged) to ensure an adequate, comfortable latch. ? Your baby's tongue should be between his or her lower gum and your breast.  Make sure that your baby's mouth is correctly positioned around your nipple (latched). Your baby's lips should create a seal on your breast and be turned out (everted).  It is common for your baby to suck about 2-3 minutes in order to start the flow of breast milk. Latching Teaching your baby how to latch onto your breast properly is very important. An improper latch can cause nipple pain, decreased milk supply, and poor weight gain in your baby. Also, if your baby is not latched onto your nipple properly, he or she may swallow some air during feeding. This can make your baby fussy. Burping your baby when you switch breasts during the feeding can help to get rid of the air. However, teaching your baby to latch on properly is still the best way to prevent fussiness from swallowing air while breastfeeding. Signs that your baby has successfully latched onto   your nipple  Silent tugging or silent sucking, without causing you pain. Infant's lips should be extended outward (flanged).  Swallowing heard between every 3-4 sucks once your milk has started to flow (after your let-down milk reflex occurs).  Muscle movement above and in front of his or her ears while sucking. Signs that your baby has not successfully latched onto your nipple  Sucking sounds or smacking sounds from your baby while breastfeeding.  Nipple pain. If you think your baby has not latched on correctly, slip your finger into the corner of your baby's mouth to break the suction and place it between your baby's gums. Attempt to start breastfeeding again. Signs of successful breastfeeding Signs from your baby  Your baby will gradually decrease the number of sucks or will completely stop sucking.  Your baby  will fall asleep.  Your baby's body will relax.  Your baby will retain a small amount of milk in his or her mouth.  Your baby will let go of your breast by himself or herself. Signs from you  Breasts that have increased in firmness, weight, and size 1-3 hours after feeding.  Breasts that are softer immediately after breastfeeding.  Increased milk volume, as well as a change in milk consistency and color by the fifth day of breastfeeding.  Nipples that are not sore, cracked, or bleeding. Signs that your baby is getting enough milk  Wetting at least 1-2 diapers during the first 24 hours after birth.  Wetting at least 5-6 diapers every 24 hours for the first week after birth. The urine should be clear or pale yellow by the age of 5 days.  Wetting 6-8 diapers every 24 hours as your baby continues to grow and develop.  At least 3 stools in a 24-hour period by the age of 5 days. The stool should be soft and yellow.  At least 3 stools in a 24-hour period by the age of 7 days. The stool should be seedy and yellow.  No loss of weight greater than 10% of birth weight during the first 3 days of life.  Average weight gain of 4-7 oz (113-198 g) per week after the age of 4 days.  Consistent daily weight gain by the age of 5 days, without weight loss after the age of 2 weeks. After a feeding, your baby may spit up a small amount of milk. This is normal. Breastfeeding frequency and duration Frequent feeding will help you make more milk and can prevent sore nipples and extremely full breasts (breast engorgement). Breastfeed when you feel the need to reduce the fullness of your breasts or when your baby shows signs of hunger. This is called "breastfeeding on demand." Signs that your baby is hungry include:  Increased alertness, activity, or restlessness.  Movement of the head from side to side.  Opening of the mouth when the corner of the mouth or cheek is stroked (rooting).  Increased  sucking sounds, smacking lips, cooing, sighing, or squeaking.  Hand-to-mouth movements and sucking on fingers or hands.  Fussing or crying. Avoid introducing a pacifier to your baby in the first 4-6 weeks after your baby is born. After this time, you may choose to use a pacifier. Research has shown that pacifier use during the first year of a baby's life decreases the risk of sudden infant death syndrome (SIDS). Allow your baby to feed on each breast as long as he or she wants. When your baby unlatches or falls asleep while feeding from the   first breast, offer the second breast. Because newborns are often sleepy in the first few weeks of life, you may need to awaken your baby to get him or her to feed. Breastfeeding times will vary from baby to baby. However, the following rules can serve as a guide to help you make sure that your baby is properly fed:  Newborns (babies 4 weeks of age or younger) may breastfeed every 1-3 hours.  Newborns should not go without breastfeeding for longer than 3 hours during the day or 5 hours during the night.  You should breastfeed your baby a minimum of 8 times in a 24-hour period. Breast milk pumping Pumping and storing breast milk allows you to make sure that your baby is exclusively fed your breast milk, even at times when you are unable to breastfeed. This is especially important if you go back to work while you are still breastfeeding, or if you are not able to be present during feedings. Your lactation consultant can help you find a method of pumping that works best for you and give you guidelines about how long it is safe to store breast milk.      Caring for your breasts while you breastfeed Nipples can become dry, cracked, and sore while breastfeeding. The following recommendations can help keep your breasts moisturized and healthy:  Avoid using soap on your nipples.  Wear a supportive bra designed especially for nursing. Avoid wearing underwire-style  bras or extremely tight bras (sports bras).  Air-dry your nipples for 3-4 minutes after each feeding.  Use only cotton bra pads to absorb leaked breast milk. Leaking of breast milk between feedings is normal.  Use lanolin on your nipples after breastfeeding. Lanolin helps to maintain your skin's normal moisture barrier. Pure lanolin is not harmful (not toxic) to your baby. You may also hand express a few drops of breast milk and gently massage that milk into your nipples and allow the milk to air-dry. In the first few weeks after giving birth, some women experience breast engorgement. Engorgement can make your breasts feel heavy, warm, and tender to the touch. Engorgement peaks within 3-5 days after you give birth. The following recommendations can help to ease engorgement:  Completely empty your breasts while breastfeeding or pumping. You may want to start by applying warm, moist heat (in the shower or with warm, water-soaked hand towels) just before feeding or pumping. This increases circulation and helps the milk flow. If your baby does not completely empty your breasts while breastfeeding, pump any extra milk after he or she is finished.  Apply ice packs to your breasts immediately after breastfeeding or pumping, unless this is too uncomfortable for you. To do this: ? Put ice in a plastic bag. ? Place a towel between your skin and the bag. ? Leave the ice on for 20 minutes, 2-3 times a day.  Make sure that your baby is latched on and positioned properly while breastfeeding. If engorgement persists after 48 hours of following these recommendations, contact your health care provider or a lactation consultant. Overall health care recommendations while breastfeeding  Eat 3 healthy meals and 3 snacks every day. Well-nourished mothers who are breastfeeding need an additional 450-500 calories a day. You can meet this requirement by increasing the amount of a balanced diet that you eat.  Drink  enough water to keep your urine pale yellow or clear.  Rest often, relax, and continue to take your prenatal vitamins to prevent fatigue, stress, and low   vitamin and mineral levels in your body (nutrient deficiencies).  Do not use any products that contain nicotine or tobacco, such as cigarettes and e-cigarettes. Your baby may be harmed by chemicals from cigarettes that pass into breast milk and exposure to secondhand smoke. If you need help quitting, ask your health care provider.  Avoid alcohol.  Do not use illegal drugs or marijuana.  Talk with your health care provider before taking any medicines. These include over-the-counter and prescription medicines as well as vitamins and herbal supplements. Some medicines that may be harmful to your baby can pass through breast milk.  It is possible to become pregnant while breastfeeding. If birth control is desired, ask your health care provider about options that will be safe while breastfeeding your baby. Where to find more information: [employer]: www.llli.org Contact a health care provider if:  You feel like you want to stop breastfeeding or have become frustrated with breastfeeding.  Your nipples are cracked or bleeding.  Your breasts are red, tender, or warm.  You have: ? Painful breasts or nipples. ? A swollen area on either breast. ? A fever or chills. ? Nausea or vomiting. ? Drainage other than breast milk from your nipples.  Your breasts do not become full before feedings by the fifth day after you give birth.  You feel sad and depressed.  Your baby is: ? Too sleepy to eat well. ? Having trouble sleeping. ? More than 1 week old and wetting fewer than 6 diapers in a 24-hour period. ? Not gaining weight by 5 days of age.  Your baby has fewer than 3 stools in a 24-hour period.  Your baby's skin or the white parts of his or her eyes become yellow. Get help right away if:  Your baby is overly tired  (lethargic) and does not want to wake up and feed.  Your baby develops an unexplained fever. Summary  Breastfeeding offers many health benefits for infant and mothers.  Try to breastfeed your infant when he or she shows early signs of hunger.  Gently tickle or stroke your baby's lips with your finger or nipple to allow the baby to open his or her mouth. Bring the baby to your breast. Make sure that much of the areola is in your baby's mouth. Offer one side and burp the baby before you offer the other side.  Talk with your health care provider or lactation consultant if you have questions or you face problems as you breastfeed. This information is not intended to replace advice given to you by your health care provider. Make sure you discuss any questions you have with your health care provider. Document Revised: 02/08/2018 Document Reviewed: 12/16/2016 Elsevier Patient Education  2021 Elsevier Inc.  

## 2021-02-02 NOTE — Progress Notes (Signed)
Subjective:  Meghan Bullock is a 30 y.o. [id number] at [redacted]w[redacted]d being seen today for ongoing prenatal care.  She is currently monitored for the following issues for this high-risk pregnancy and has Heroin abuse (HCC); Encephalopathy, toxic; Benzodiazepine abuse (HCC); Preterm premature rupture of membranes (PPROM) with unknown onset of labor; Hepatitis C; Substance abuse affecting pregnancy, antepartum; History of preterm delivery, currently pregnant in second trimester; Iron deficiency anemia; Cellulitis and abscess of hand; 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HCC); Abscess of wrist; Substance use disorder; and Supervision of high risk pregnancy, antepartum on their problem list.  Patient reports severe anxiety..  Contractions: Not present. Vag. Bleeding: None.  Movement: Present. Denies leaking of fluid.   The following portions of the patient's history were reviewed and updated as appropriate: allergies, current medications, past family history, past medical history, past social history, past surgical history and problem list. Problem list updated.  Objective:   Vitals:   02/02/21 1410  BP: (!) 128/91  Pulse: 90  Weight: 164 lb 12.8 oz (74.8 kg)    Fetal Status: Fetal Heart Rate (bpm): 136   Movement: Present     General:  Alert, oriented and cooperative. Patient is in no acute distress.  Skin: Skin is warm and dry. No rash noted.   Cardiovascular: Normal heart rate noted  Respiratory: Normal respiratory effort, no problems with respiration noted  Abdomen: Soft, gravid, appropriate for gestational age. Pain/Pressure: Absent     Pelvic: Vag. Bleeding: None     Cervical exam deferred        Extremities: Normal range of motion.  Edema: None  Mental Status: Normal mood and affect. Normal behavior. Normal judgment and thought content.   Urinalysis:      Assessment and Plan:  Pregnancy: [id number] at [redacted]w[redacted]d  1. Supervision of high risk pregnancy, antepartum BP mild range, see below FHR normal Anatomy US  unremarkable, aware that she has a follow up US later this week  2. Substance use disorder Patient has not been seen in clinic since we initially discussed starting suboxone on 01/05/2021 At that time I had given her a weeks supply, she subsequently no showed and then called and was given refill on 01/22/2021 by Dr. Adrian Blackwater. Also given Celexa  States that she has been taking suboxone PRN as opposed to daily as prescribed "to make it last", expressed frustration that she is dependent on a medication Long discussion regarding the negative connotations of this view point, reinforced that she has a chronic disease with a high rate of morbidity/mortality and that it has an effective and life saving treatment in the form of MAT Reports she has been using MJ and street valiums but denies any other drug use since her overdose at New Years Unclear to me exactly how and how often she is taking suboxone, appears to be anywhere from 4-8 mg daily but as she was spreading it out it's not clear Reports she was on up to 20mg  previously We settled on a trial of 8mg  daily, she will call if this is insufficient UDS today  3. History of preterm delivery, currently pregnant in second trimester Not on Makena, PPROM w del at 36 wks previously  4. Chronic hepatitis C without hepatic coma (HCC) Viral load negative at new OB  5. Elevated blood pressure without diagnosis of hypertension Patient's BP mild range today Recommended we collect baseline labs but did not want to as she reported she was hungry and nauseous Encouraged her to have some zofran  and crackers at the clinic but declined, will collect at her next visit  6. Anxiety Reports crippling anxiety with multiple panic attacks over the past several weeks Felt that the celexa made her feel weird, does not want to take it After discussion accepts trial of Zoloft, stressed that it can take several weeks to be fully effective Has tried vistaril/atarax in the past,  not interested Discussed that I am not comfortable mixing benzo's with her suboxone and in the larger setting of her use disorders, though it does sound like she may be a candidate for such medications I recommended she seek care at the [facility] services office across the street so that she can establish care with a psychiatrist who is more comfortable with the management of severe anxiety and benzodiazapenes  Preterm labor symptoms and general obstetric precautions including but not limited to vaginal bleeding, contractions, leaking of fluid and fetal movement were reviewed in detail with the patient. Please refer to After Visit Summary for other counseling recommendations.  Return in 2 weeks (on 02/16/2021).   Venora Maples, MD

## 2021-02-02 NOTE — BH Specialist Note (Signed)
Pt answered the phone, and prefers to reschedule; pt requests a call-back later today, as she is currently driving; ; left MyChart message for patient.

## 2021-02-03 ENCOUNTER — Telehealth: Payer: Self-pay | Admitting: Family Medicine

## 2021-02-03 NOTE — Telephone Encounter (Signed)
Spoke w [facility], clarified that they did indeed have the rx and that they could fill it, issue had to do both with patient having a different legal name right now and Suboxone was originally run as a generic instead of brand name which Medicaid will not cover. Rx now available and should only be $3.  Admin pool please call patient and update her legal name in Epic.

## 2021-02-03 NOTE — Telephone Encounter (Signed)
Spoke with Pharmacist at [facility] who reports they did not receive the prescription for SubOxone and would like for it to be resent. Records review shows prescription was sent and receiuved by pharmacy.   Pharmacist indicates patient is now going by the last name of Toure and they reports last SubOxone prescription was refilled on 2/26.   Will forward to Dr. Crissie Reese for review and reorder.

## 2021-02-03 NOTE — Telephone Encounter (Signed)
Hello, pt called and stated that the pharmacy said that they did not get prescription for Buprenorphine HCl-Naloxone HCl (SUBOXONE) 8-2 MG FILM And she said that she was able to get the other meds but needs this one asap. Pt request that you [facility] [address] [facility] # [phone]. thanks

## 2021-02-05 ENCOUNTER — Ambulatory Visit: Payer: Medicaid Other | Attending: Obstetrics and Gynecology

## 2021-02-05 ENCOUNTER — Ambulatory Visit: Payer: Medicaid Other | Admitting: *Deleted

## 2021-02-05 ENCOUNTER — Other Ambulatory Visit: Payer: Self-pay

## 2021-02-05 ENCOUNTER — Encounter: Payer: Self-pay | Admitting: *Deleted

## 2021-02-05 DIAGNOSIS — O99320 Drug use complicating pregnancy, unspecified trimester: Secondary | ICD-10-CM | POA: Insufficient documentation

## 2021-02-05 DIAGNOSIS — O0990 Supervision of high risk pregnancy, unspecified, unspecified trimester: Secondary | ICD-10-CM

## 2021-02-05 DIAGNOSIS — B182 Chronic viral hepatitis C: Secondary | ICD-10-CM

## 2021-02-05 DIAGNOSIS — Z3A25 25 weeks gestation of pregnancy: Secondary | ICD-10-CM

## 2021-02-05 DIAGNOSIS — F1910 Other psychoactive substance abuse, uncomplicated: Secondary | ICD-10-CM | POA: Diagnosis not present

## 2021-02-05 DIAGNOSIS — F1120 Opioid dependence, uncomplicated: Secondary | ICD-10-CM | POA: Insufficient documentation

## 2021-02-05 DIAGNOSIS — O99322 Drug use complicating pregnancy, second trimester: Secondary | ICD-10-CM

## 2021-02-05 DIAGNOSIS — O98412 Viral hepatitis complicating pregnancy, second trimester: Secondary | ICD-10-CM

## 2021-02-05 DIAGNOSIS — Z369 Encounter for antenatal screening, unspecified: Secondary | ICD-10-CM

## 2021-02-08 ENCOUNTER — Other Ambulatory Visit: Payer: Self-pay | Admitting: *Deleted

## 2021-02-08 DIAGNOSIS — O99322 Drug use complicating pregnancy, second trimester: Secondary | ICD-10-CM

## 2021-02-08 DIAGNOSIS — F1120 Opioid dependence, uncomplicated: Secondary | ICD-10-CM

## 2021-02-11 ENCOUNTER — Ambulatory Visit: Payer: Medicaid Other | Admitting: Clinical

## 2021-02-11 DIAGNOSIS — Z91199 Patient's noncompliance with other medical treatment and regimen due to unspecified reason: Secondary | ICD-10-CM

## 2021-02-11 DIAGNOSIS — Z5329 Procedure and treatment not carried out because of patient's decision for other reasons: Secondary | ICD-10-CM

## 2021-02-16 ENCOUNTER — Encounter: Payer: Medicaid Other | Admitting: Family Medicine

## 2021-02-18 ENCOUNTER — Telehealth: Payer: Self-pay | Admitting: Family Medicine

## 2021-02-18 DIAGNOSIS — F1990 Other psychoactive substance use, unspecified, uncomplicated: Secondary | ICD-10-CM

## 2021-02-18 DIAGNOSIS — O99320 Drug use complicating pregnancy, unspecified trimester: Secondary | ICD-10-CM

## 2021-02-18 MED ORDER — BUPRENORPHINE HCL 8 MG SL SUBL: 8.0000 mg | SUBLINGUAL_TABLET | Freq: Two times a day (BID) | SUBLINGUAL | 0 refills | Status: DC

## 2021-02-18 MED ORDER — NALOXONE HCL 4 MG/0.1ML NA LIQD: NASAL | 0 refills | Status: DC

## 2021-02-18 MED ORDER — ONDANSETRON 4 MG PO TBDP
4.0000 mg | ORAL_TABLET | Freq: Three times a day (TID) | ORAL | 2 refills | Status: DC | PRN
Start: 2021-02-18 — End: 2021-03-16

## 2021-02-18 NOTE — Telephone Encounter (Signed)
Notified by clinical staff that patient is running out of suboxone and feeling unwell.   Called patient and she reports she has two 8 mg tabs left. She has been trying not to take them. We discussed that she will feel better and be more stable in many regards if she takes her suboxone regularly. Will send her a refill for 8 tabs which will get her through until Tuesday afternoon when she is scheduled to see me. Emphasized importance of coming to that appointment.   Would recommend no more refills on suboxone until patient presents to care in person and leaves UDS (with her consent). Patient has been inconsistently attending appointments and I suspect from our recent appointments and interactions that she has relapsed.

## 2021-02-23 ENCOUNTER — Encounter: Payer: Medicaid Other | Admitting: Family Medicine

## 2021-02-23 ENCOUNTER — Telehealth: Payer: Self-pay

## 2021-02-23 NOTE — Telephone Encounter (Signed)
Called pt at appt time today; unable to leave VM due to full VM box. Called pt's mother who states she does not know where pt is. I requested mother notify pt I called and that her VM is full if they talk in the near future.

## 2021-03-05 ENCOUNTER — Ambulatory Visit: Payer: Self-pay

## 2021-03-05 ENCOUNTER — Ambulatory Visit: Payer: Medicaid Other

## 2021-03-10 ENCOUNTER — Ambulatory Visit (INDEPENDENT_AMBULATORY_CARE_PROVIDER_SITE_OTHER): Payer: Medicaid Other | Admitting: Family Medicine

## 2021-03-10 ENCOUNTER — Inpatient Hospital Stay (HOSPITAL_BASED_OUTPATIENT_CLINIC_OR_DEPARTMENT_OTHER): Payer: Medicaid Other

## 2021-03-10 ENCOUNTER — Inpatient Hospital Stay (HOSPITAL_COMMUNITY)
Admission: AD | Admit: 2021-03-10 | Discharge: 2021-03-16 | DRG: 832 | Disposition: A | Discharge status: Rehabilitation Facility | Payer: Medicaid Other | Attending: Obstetrics & Gynecology | Admitting: Obstetrics & Gynecology

## 2021-03-10 ENCOUNTER — Other Ambulatory Visit: Payer: Self-pay

## 2021-03-10 VITALS — BP 113/80 | HR 112 | Wt 148.5 lb

## 2021-03-10 DIAGNOSIS — F1120 Opioid dependence, uncomplicated: Secondary | ICD-10-CM | POA: Diagnosis present

## 2021-03-10 DIAGNOSIS — Z8759 Personal history of other complications of pregnancy, childbirth and the puerperium: Secondary | ICD-10-CM | POA: Diagnosis present

## 2021-03-10 DIAGNOSIS — O99283 Endocrine, nutritional and metabolic diseases complicating pregnancy, third trimester: Secondary | ICD-10-CM

## 2021-03-10 DIAGNOSIS — E440 Moderate protein-calorie malnutrition: Secondary | ICD-10-CM | POA: Diagnosis present

## 2021-03-10 DIAGNOSIS — E876 Hypokalemia: Secondary | ICD-10-CM

## 2021-03-10 DIAGNOSIS — O0990 Supervision of high risk pregnancy, unspecified, unspecified trimester: Secondary | ICD-10-CM

## 2021-03-10 DIAGNOSIS — O1413 Severe pre-eclampsia, third trimester: Secondary | ICD-10-CM | POA: Diagnosis present

## 2021-03-10 DIAGNOSIS — O99891 Other specified diseases and conditions complicating pregnancy: Secondary | ICD-10-CM | POA: Diagnosis not present

## 2021-03-10 DIAGNOSIS — Z20822 Contact with and (suspected) exposure to covid-19: Secondary | ICD-10-CM | POA: Diagnosis present

## 2021-03-10 DIAGNOSIS — Z3A31 31 weeks gestation of pregnancy: Secondary | ICD-10-CM | POA: Diagnosis not present

## 2021-03-10 DIAGNOSIS — R030 Elevated blood-pressure reading, without diagnosis of hypertension: Secondary | ICD-10-CM | POA: Diagnosis not present

## 2021-03-10 DIAGNOSIS — O99333 Smoking (tobacco) complicating pregnancy, third trimester: Secondary | ICD-10-CM | POA: Diagnosis present

## 2021-03-10 DIAGNOSIS — Z23 Encounter for immunization: Secondary | ICD-10-CM | POA: Diagnosis not present

## 2021-03-10 DIAGNOSIS — F1513 Other stimulant abuse with withdrawal: Secondary | ICD-10-CM

## 2021-03-10 DIAGNOSIS — F419 Anxiety disorder, unspecified: Secondary | ICD-10-CM

## 2021-03-10 DIAGNOSIS — F1310 Sedative, hypnotic or anxiolytic abuse, uncomplicated: Secondary | ICD-10-CM | POA: Diagnosis present

## 2021-03-10 DIAGNOSIS — O99323 Drug use complicating pregnancy, third trimester: Secondary | ICD-10-CM

## 2021-03-10 DIAGNOSIS — R634 Abnormal weight loss: Secondary | ICD-10-CM | POA: Diagnosis not present

## 2021-03-10 DIAGNOSIS — F1910 Other psychoactive substance abuse, uncomplicated: Secondary | ICD-10-CM | POA: Diagnosis not present

## 2021-03-10 DIAGNOSIS — Z362 Encounter for other antenatal screening follow-up: Secondary | ICD-10-CM | POA: Diagnosis not present

## 2021-03-10 DIAGNOSIS — F1990 Other psychoactive substance use, unspecified, uncomplicated: Secondary | ICD-10-CM

## 2021-03-10 DIAGNOSIS — F17210 Nicotine dependence, cigarettes, uncomplicated: Secondary | ICD-10-CM | POA: Diagnosis present

## 2021-03-10 DIAGNOSIS — F19139 Other psychoactive substance abuse with withdrawal, unspecified: Secondary | ICD-10-CM | POA: Diagnosis not present

## 2021-03-10 DIAGNOSIS — Z3A30 30 weeks gestation of pregnancy: Secondary | ICD-10-CM | POA: Diagnosis not present

## 2021-03-10 DIAGNOSIS — O98413 Viral hepatitis complicating pregnancy, third trimester: Secondary | ICD-10-CM | POA: Diagnosis not present

## 2021-03-10 DIAGNOSIS — F1110 Opioid abuse, uncomplicated: Secondary | ICD-10-CM | POA: Diagnosis present

## 2021-03-10 DIAGNOSIS — D509 Iron deficiency anemia, unspecified: Secondary | ICD-10-CM

## 2021-03-10 DIAGNOSIS — B1920 Unspecified viral hepatitis C without hepatic coma: Secondary | ICD-10-CM

## 2021-03-10 DIAGNOSIS — O99343 Other mental disorders complicating pregnancy, third trimester: Secondary | ICD-10-CM | POA: Diagnosis not present

## 2021-03-10 DIAGNOSIS — O1403 Mild to moderate pre-eclampsia, third trimester: Principal | ICD-10-CM | POA: Diagnosis present

## 2021-03-10 DIAGNOSIS — O2513 Malnutrition in pregnancy, third trimester: Secondary | ICD-10-CM

## 2021-03-10 DIAGNOSIS — O99320 Drug use complicating pregnancy, unspecified trimester: Secondary | ICD-10-CM

## 2021-03-10 DIAGNOSIS — O09893 Supervision of other high risk pregnancies, third trimester: Secondary | ICD-10-CM

## 2021-03-10 DIAGNOSIS — F1191 Opioid use, unspecified, in remission: Secondary | ICD-10-CM | POA: Diagnosis present

## 2021-03-10 DIAGNOSIS — O212 Late vomiting of pregnancy: Secondary | ICD-10-CM | POA: Diagnosis not present

## 2021-03-10 DIAGNOSIS — O99013 Anemia complicating pregnancy, third trimester: Secondary | ICD-10-CM

## 2021-03-10 DIAGNOSIS — O1490 Unspecified pre-eclampsia, unspecified trimester: Secondary | ICD-10-CM

## 2021-03-10 DIAGNOSIS — O1493 Unspecified pre-eclampsia, third trimester: Secondary | ICD-10-CM | POA: Diagnosis not present

## 2021-03-10 HISTORY — DX: Unspecified viral hepatitis C without hepatic coma: B19.20

## 2021-03-10 LAB — CBC
HCT: 32.7 % — ABNORMAL LOW (ref 36.0–46.0)
Hemoglobin: 10.2 g/dL — ABNORMAL LOW (ref 12.0–15.0)
MCH: 22.2 pg — ABNORMAL LOW (ref 26.0–34.0)
MCHC: 31.2 g/dL (ref 30.0–36.0)
MCV: 71.1 fL — ABNORMAL LOW (ref 80.0–100.0)
Platelets: 162 10*3/uL (ref 150–400)
RBC: 4.6 MIL/uL (ref 3.87–5.11)
RDW: 16.1 % — ABNORMAL HIGH (ref 11.5–15.5)
WBC: 13.4 10*3/uL — ABNORMAL HIGH (ref 4.0–10.5)
nRBC: 0.2 % (ref 0.0–0.2)

## 2021-03-10 LAB — COMPREHENSIVE METABOLIC PANEL
ALT: 13 U/L (ref 0–44)
AST: 20 U/L (ref 15–41)
Albumin: 2.1 g/dL — ABNORMAL LOW (ref 3.5–5.0)
Alkaline Phosphatase: 124 U/L (ref 38–126)
Anion gap: 10 (ref 5–15)
BUN: 8 mg/dL (ref 6–20)
CO2: 35 mmol/L — ABNORMAL HIGH (ref 22–32)
Calcium: 8.2 mg/dL — ABNORMAL LOW (ref 8.9–10.3)
Chloride: 85 mmol/L — ABNORMAL LOW (ref 98–111)
Creatinine, Ser: 0.78 mg/dL (ref 0.44–1.00)
GFR, Estimated: >60 mL/min (ref >60)
Glucose, Bld: 103 mg/dL — ABNORMAL HIGH (ref 70–99)
Potassium: 2.2 mmol/L — CL (ref 3.5–5.1)
Sodium: 130 mmol/L — ABNORMAL LOW (ref 135–145)
Total Bilirubin: 1.1 mg/dL (ref 0.3–1.2)
Total Protein: 6.3 g/dL — ABNORMAL LOW (ref 6.5–8.1)

## 2021-03-10 LAB — RAPID URINE DRUG SCREEN, HOSP PERFORMED
Amphetamines: NOT DETECTED
Barbiturates: NOT DETECTED
Benzodiazepines: POSITIVE — AB
Cocaine: NOT DETECTED
Opiates: NOT DETECTED
Tetrahydrocannabinol: POSITIVE — AB

## 2021-03-10 LAB — TYPE AND SCREEN
ABO/RH(D): O POS
Antibody Screen: NEGATIVE

## 2021-03-10 LAB — RAPID HIV SCREEN (HIV 1/2 AB+AG)
HIV 1/2 Antibodies: NONREACTIVE
HIV-1 P24 Antigen - HIV24: NONREACTIVE

## 2021-03-10 LAB — HEMOGLOBIN A1C
Hgb A1c MFr Bld: 5.1 % (ref 4.8–5.6)
Mean Plasma Glucose: 99.67 mg/dL

## 2021-03-10 LAB — SARS CORONAVIRUS 2 (TAT 6-24 HRS): SARS Coronavirus 2: NEGATIVE

## 2021-03-10 MED ORDER — ONDANSETRON HCL 4 MG/2ML IJ SOLN
4.0000 mg | Freq: Four times a day (QID) | INTRAMUSCULAR | Status: DC | PRN
  Administered 2021-03-10 – 2021-03-12 (×4): 4 mg via INTRAVENOUS
  Filled 2021-03-10 (×4): qty 2

## 2021-03-10 MED ORDER — POTASSIUM CHLORIDE 10 MEQ/100ML IV SOLN
10.0000 meq | INTRAVENOUS | Status: AC
  Administered 2021-03-10 – 2021-03-11 (×3): 10 meq via INTRAVENOUS
  Filled 2021-03-10 (×3): qty 100

## 2021-03-10 MED ORDER — CALCIUM CARBONATE ANTACID 500 MG PO CHEW
2.0000 | CHEWABLE_TABLET | ORAL | Status: DC | PRN
  Administered 2021-03-11 – 2021-03-14 (×2): 400 mg via ORAL
  Filled 2021-03-10 (×2): qty 2

## 2021-03-10 MED ORDER — CEPHALEXIN 500 MG PO CAPS
500.0000 mg | ORAL_CAPSULE | Freq: Three times a day (TID) | ORAL | Status: DC
  Administered 2021-03-10 – 2021-03-15 (×16): 500 mg via ORAL
  Filled 2021-03-10 (×17): qty 1

## 2021-03-10 MED ORDER — LOPERAMIDE HCL 2 MG PO CAPS: 2.0000 mg | ORAL_CAPSULE | ORAL | Status: DC | PRN

## 2021-03-10 MED ORDER — ZOLPIDEM TARTRATE 5 MG PO TABS
5.0000 mg | ORAL_TABLET | Freq: Every evening | ORAL | Status: DC | PRN
  Administered 2021-03-10 – 2021-03-15 (×6): 5 mg via ORAL
  Filled 2021-03-10 (×6): qty 1

## 2021-03-10 MED ORDER — LACTATED RINGERS IV BOLUS
1000.0000 mL | Freq: Once | INTRAVENOUS | Status: AC
  Administered 2021-03-11: 1000 mL via INTRAVENOUS

## 2021-03-10 MED ORDER — ALUM & MAG HYDROXIDE-SIMETH 200-200-20 MG/5ML PO SUSP
15.0000 mL | ORAL | Status: DC | PRN
  Administered 2021-03-11 – 2021-03-13 (×3): 15 mL via ORAL
  Filled 2021-03-10 (×3): qty 30

## 2021-03-10 MED ORDER — NICOTINE 14 MG/24HR TD PT24
14.0000 mg | MEDICATED_PATCH | Freq: Every day | TRANSDERMAL | Status: DC
  Administered 2021-03-10 – 2021-03-11 (×2): 14 mg via TRANSDERMAL
  Filled 2021-03-10 (×5): qty 1

## 2021-03-10 MED ORDER — PRENATAL MULTIVITAMIN CH
1.0000 | ORAL_TABLET | Freq: Every day | ORAL | Status: DC
  Administered 2021-03-11 – 2021-03-14 (×4): 1 via ORAL
  Filled 2021-03-10 (×4): qty 1

## 2021-03-10 MED ORDER — DOCUSATE SODIUM 100 MG PO CAPS
100.0000 mg | ORAL_CAPSULE | Freq: Every day | ORAL | Status: DC
  Administered 2021-03-12 – 2021-03-13 (×2): 100 mg via ORAL
  Filled 2021-03-10 (×5): qty 1

## 2021-03-10 MED ORDER — ENSURE ENLIVE PO LIQD
237.0000 mL | Freq: Two times a day (BID) | ORAL | Status: DC
  Administered 2021-03-11 – 2021-03-14 (×4): 237 mL via ORAL
  Filled 2021-03-10 (×15): qty 237

## 2021-03-10 MED ORDER — ACETAMINOPHEN 325 MG PO TABS: 650.0000 mg | ORAL_TABLET | ORAL | Status: DC | PRN

## 2021-03-10 MED ORDER — SODIUM CHLORIDE 0.9 % IV SOLN
12.5000 mg | Freq: Four times a day (QID) | INTRAVENOUS | Status: DC | PRN
  Administered 2021-03-11 (×2): 12.5 mg via INTRAVENOUS
  Filled 2021-03-10 (×3): qty 0.5

## 2021-03-10 MED ORDER — TETANUS-DIPHTH-ACELL PERTUSSIS 5-2.5-18.5 LF-MCG/0.5 IM SUSY
0.5000 mL | PREFILLED_SYRINGE | Freq: Once | INTRAMUSCULAR | Status: AC
  Administered 2021-03-10: 0.5 mL via INTRAMUSCULAR
  Filled 2021-03-10: qty 0.5

## 2021-03-10 MED ORDER — PANTOPRAZOLE SODIUM 40 MG IV SOLR
40.0000 mg | INTRAVENOUS | Status: DC
  Administered 2021-03-10 – 2021-03-13 (×4): 40 mg via INTRAVENOUS
  Filled 2021-03-10 (×4): qty 40

## 2021-03-10 MED ORDER — METHADONE HCL 10 MG PO TABS
10.0000 mg | ORAL_TABLET | ORAL | Status: AC | PRN
  Administered 2021-03-10 (×4): 10 mg via ORAL
  Filled 2021-03-10 (×4): qty 1

## 2021-03-10 NOTE — Progress Notes (Signed)
Initial Nutrition Assessment  DOCUMENTATION CODES:   Non-severe (moderate) malnutrition in context of acute illness/injury  INTERVENTION:  Regular diet, w/ double protein portions and snacks TID  Ensure Enlive po BID, each supplement provides 350 kcal and 20 grams of protein Promote small frequent meals/snacks  Monitor tolerance to regular diet If N/V occur, decrease diet to C/L and supplement to [employer] IVF w/ MVI     NUTRITION DIAGNOSIS:   Moderate Malnutrition related to nausea,vomiting as evidenced by percent weight loss.  GOAL:  Weight gain   MONITOR:  Weight trends  REASON FOR ASSESSMENT:   Consult Hyperemesis  ASSESSMENT:   Adm at 30 4/7 weeks w/ hyperemesis. Chart review indicates concerns for hyperemesis entire pregancy, but with significant weight loss over tthe past month. 16 lb weight loss ( 10% of usual ) since 02/02/21  Given social history, this pt may not be a candidate for  repletion with tubefeedings   Diet Order:   Diet Order            Diet regular Room service appropriate? Yes; Fluid consistency: Thin  Diet effective now                 EDUCATION NEEDS:   No education needs have been identified at this time  Skin:  Skin Assessment: Reviewed RN Assessment   Height:   Ht Readings from Last 1 Encounters:  03/10/21 5\' 4"  (1.626 m)    Weight:   Wt Readings from Last 1 Encounters:  03/10/21 67.1 kg    Ideal Body Weight:   120 lbs  BMI:  Body mass index is 25.4 kg/m.  Estimated Nutritional Needs:   Kcal:  2100-2300  Protein:  95-105 g  Fluid:  2.4 L

## 2021-03-10 NOTE — Progress Notes (Signed)
Subjective:  Meghan Bullock is a 30 y.o. [id number] at [redacted]w[redacted]d being seen today for ongoing prenatal care.  She is currently monitored for the following issues for this high-risk pregnancy and has Heroin abuse (HCC); Encephalopathy, toxic; Benzodiazepine abuse (HCC); Preterm premature rupture of membranes (PPROM) with unknown onset of labor; Hepatitis C; Substance abuse affecting pregnancy, antepartum; History of preterm delivery, currently pregnant in second trimester; Iron deficiency anemia; Cellulitis and abscess of hand; 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HCC); Abscess of wrist; Substance use disorder; Supervision of high risk pregnancy, antepartum; Elevated blood-pressure reading without diagnosis of hypertension; and Anxiety on their problem list.  Patient reports withdrawal symptoms.  Contractions: Not present. Vag. Bleeding: None.  Movement: Present. Denies leaking of fluid.   The following portions of the patient's history were reviewed and updated as appropriate: allergies, current medications, past family history, past medical history, past social history, past surgical history and problem list. Problem list updated.  Objective:   Vitals:   03/10/21 1445  BP: 113/80  Pulse: (!) 112  Weight: 148 lb 8 oz (67.4 kg)    Fetal Status: Fetal Heart Rate (bpm): 148   Movement: Present     General:  Alert, oriented and cooperative. Patient is in no acute distress.  Skin: Skin is warm and dry. No rash noted.   Cardiovascular: Normal heart rate noted  Respiratory: Normal respiratory effort, no problems with respiration noted  Abdomen: Soft, gravid, appropriate for gestational age. Pain/Pressure: Absent     Pelvic: Vag. Bleeding: None     Cervical exam deferred        Extremities: Normal range of motion.  Edema: None  Mental Status: Normal mood and affect. Normal behavior. Normal judgment and thought content.   Urinalysis:      Wt Readings from Last 3 Encounters:  03/10/21 148 lb 8 oz (67.4 kg)   02/02/21 164 lb 12.8 oz (74.8 kg)  01/05/21 162 lb 14.4 oz (73.9 kg)    Assessment and Plan:  Pregnancy: [id number] at [redacted]w[redacted]d  1. Supervision of high risk pregnancy, antepartum Patient presented without appointment and asked to be seen Has done this multiple times in the past few weeks I have not seen her consistently or been able to address her pregnancy issues to any great degree since first meeting her in February Patient has relapsed with opioids and despite attempts to start outpatient suboxone induction patient has continued to spiral Patient has also lost 14 pounds since last being seen over a month ago and reports severe n/v and inability to take PO Given her severe opioid use disorder and HG/weight loss I recommended admission to the patient to address both these issues Patient amenable with this plan. Discussed with Dr. Debroah Loop who accepts the patient for direct admission. Patient reports active withdrawal symptoms at present, last used fentanyl intranasally two days ago Discussed with her that since suboxone has not been effective (and I have high suspicion for misuse/diversion) that we should consider methadone for a more structured approach to her OUD She is in agreement with this plan Recommend screening ECG on admission to evaluate for QT abnormalities and then place on COWS scoring system, when scores >8 give 10mg  methadone q1hr up to 40 mg in the first 24 hours Following day can give AM dose of total from previous day and then given an additional 10mg  PRN Subsequently would not increase methadone for another 72 hours to allow time to reach steady state Patient will also need third trimester labs  performed as well as TDaP HG/weight loss care per Dr. Debroah Loop - Pain Mgt [facility]rn ([facility]), Ur  2. Substance abuse affecting pregnancy, antepartum See above  Preterm labor symptoms and general obstetric precautions including but not limited to vaginal bleeding, contractions, leaking of  fluid and fetal movement were reviewed in detail with the patient. Please refer to After Visit Summary for other counseling recommendations.  No follow-ups on file.   Venora Maples, MD

## 2021-03-10 NOTE — H&P (Signed)
FACULTY PRACTICE ANTEPARTUM ADMISSION HISTORY AND PHYSICAL NOTE   History of Present Illness: Meghan Bullock is a 30 y.o. [id number] at [redacted]w[redacted]d admitted for weight loss and uncontrolled severe opioid use disorder.    Patient is well known to me from clinic. She presented to clinic earlier today without an appointment and asked to be seen as she had run out of Suboxone. She has presented to clinic in a similar fashion several times. She reported she had relapsed and resumed using opioids, last using fentanyl intranasally two days ago. She endorses currently feeling symptoms of withdrawal. Denies any other substance use. Endorses good fetal movement, denies vaginal bleeding or fluid per vagina. She desires to stop using opioids.    Also endorses significant nausea, vomiting, and acid reflux symptoms such that she has been having difficulty eating and drinking. Feels that she has lost weight.    Patient Active Problem List   Diagnosis Date Noted  . Malnutrition of moderate degree 03/10/2021  . Elevated blood-pressure reading without diagnosis of hypertension 02/02/2021  . Anxiety 02/02/2021  . Supervision of high risk pregnancy, antepartum 12/24/2020  . Abscess of wrist 03/11/2020  . Substance use disorder 03/11/2020  . Cellulitis and abscess of hand   . 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HCC)   . Iron deficiency anemia 04/30/2018  . Substance abuse affecting pregnancy, antepartum 10/25/2017  . History of preterm delivery, currently pregnant in second trimester 10/25/2017  . Hepatitis C 10/23/2017  . Preterm premature rupture of membranes (PPROM) with unknown onset of labor 05/12/2014  . Encephalopathy, toxic 10/03/2013  . Benzodiazepine abuse (HCC) 10/03/2013  . Heroin abuse [facility])     Past Medical History:  Diagnosis Date  . Anemia   . Anxiety   . Bipolar 1 disorder (HCC)   . Chlamydia   . Encounter for induction of labor 06/15/2018  . Hepatitis    Hepatitis C  . Heroin abuse (HCC)   .  Mononucleosis   . Normal labor 06/17/2018  . Ovarian cyst   . Polysubstance abuse (HCC)   . Postpartum care following vaginal delivery 06/17/2018    Past Surgical History:  Procedure Laterality Date  . HAND SURGERY    . I & D EXTREMITY Left 09/16/2019   Procedure: IRRIGATION AND DEBRIDEMENT EXTREMITY;  Surgeon: Ernest Mallick, MD;  Location: [facility] OR;  Service: Orthopedics;  Laterality: Left;    OB History  Gravida Para Term Preterm AB Living  4 2 1 1 1 2   SAB IAB Ectopic Multiple Live Births  1     0 2    # Outcome Date GA Lbr Len/2nd Weight Sex Delivery Anes PTL Lv  4 Current           3 Term 06/15/18 [redacted]w[redacted]d / 00:07 3720 g M Vag-Spont EPI  LIV  2 Preterm 05/13/14 [redacted]w[redacted]d 35:35 / 00:40 3265 g M Vag-Spont EPI  LIV     Birth Comments: WNL  1 SAB              Birth Comments: System Generated. Please review and update pregnancy details.    Social History   Socioeconomic History  . Marital status: Legally Separated    Spouse name: Not on file  . Number of children: Not on file  . Years of education: Not on file  . Highest education level: Not on file  Occupational History  . Not on file  Tobacco Use  . Smoking status: Current Every Day Smoker  Packs/day: 1.50    Years: 5.00    Pack years: 7.50    Types: Cigarettes  . Smokeless tobacco: Never Used  Vaping Use  . Vaping Use: Never used  Substance and Sexual Activity  . Alcohol use: No  . Drug use: Yes    Types: Heroin, Marijuana    Comment: fentanyl-heroin 09/29/2020  . Sexual activity: Yes    Birth control/protection: None  Other Topics Concern  . Not on file  Social History Narrative  . Not on file   Social Determinants of Health   Financial Resource Strain: Not on file  Food Insecurity: Food Insecurity Present  . Worried About Programme researcher, broadcasting/film/video in the Last Year: Sometimes true  . Ran Out of Food in the Last Year: Never true  Transportation Needs: Unmet Transportation Needs  . Lack of Transportation  (Medical): Yes  . Lack of Transportation (Non-Medical): Yes  Physical Activity: Not on file  Stress: Not on file  Social Connections: Not on file    Family History  Problem Relation Age of Onset  . COPD Mother   . Anxiety disorder Mother   . Emphysema Mother     Allergies  Allergen Reactions  . Fentanyl Hives  . Acetaminophen Itching    Medications Prior to Admission  Medication Sig Dispense Refill Last Dose  . buprenorphine (SUBUTEX) 8 MG SUBL SL tablet Place 1 tablet (8 mg total) under the tongue 2 (two) times daily. (Patient not taking: Reported on 03/10/2021) 8 tablet 0   . citalopram (CELEXA) 20 MG tablet Take 20 mg by mouth daily. (Patient not taking: Reported on 03/10/2021)     . naloxone (NARCAN) nasal spray 4 mg/0.1 mL Use in case of opioid overdose (Patient not taking: Reported on 03/10/2021) 2 each 0   . ondansetron (ZOFRAN ODT) 4 MG disintegrating tablet Take 1 tablet (4 mg total) by mouth every 8 (eight) hours as needed for nausea or vomiting. (Patient not taking: Reported on 03/10/2021) 30 tablet 2   . pantoprazole (PROTONIX) 20 MG tablet Take 1 tablet (20 mg total) by mouth daily. 30 tablet 5   . sertraline (ZOLOFT) 50 MG tablet Take 1 tablet (50 mg total) by mouth daily. (Patient not taking: Reported on 03/10/2021) 30 tablet 5     Pertinent positives and negative per HPI, all others reviewed and negative  Vitals:  BP 111/70 (BP Location: Right Arm)   Pulse 90   Temp 98.6 F (37 C) (Oral)   Resp 18   Ht 5\' 4"  (1.626 m)   Wt 67.1 kg   LMP 08/08/2020   SpO2 99%   BMI 25.40 kg/m  Physical Examination: CONSTITUTIONAL: Disheveled, appears older than stated age.  HENT:  Normocephalic, atraumatic EYES: Conjunctivae and EOM are normal. No scleral icterus.  NECK: Normal range of motion, supple, no masses SKIN: Skin is warm and dry. No rash noted on limited exam. Not diaphoretic. No erythema. No pallor. NEUROLGIC: Alert and oriented to person, place, and time.   PSYCHIATRIC: Anxious affect. Normal behavior. Normal judgment and thought content. RESPIRATORY: Effort normal, no problems with respiration noted  Cervix: Not evaluated Fetal Monitoring:doppler 155  Labs:  Results for orders placed or performed during the hospital encounter of 03/10/21 (from the past 24 hour(s))  Type and screen [facility]   Collection Time: 03/10/21  5:39 PM  Result Value Ref Range   ABO/RH(D) O POS    Antibody Screen NEG    Sample Expiration  03/13/2021,2359 Performed at [facility], [address], [place], [place] [address]   CBC on admission   Collection Time: 03/10/21  5:43 PM  Result Value Ref Range   WBC 13.4 (H) 4.0 - 10.5 K/uL   RBC 4.60 3.87 - 5.11 MIL/uL   Hemoglobin 10.2 (L) 12.0 - 15.0 g/dL   HCT 42.7 (L) 06.2 - 37.6 %   MCV 71.1 (L) 80.0 - 100.0 fL   MCH 22.2 (L) 26.0 - 34.0 pg   MCHC 31.2 30.0 - 36.0 g/dL   RDW 28.3 (H) 15.1 - 76.1 %   Platelets 162 150 - 400 K/uL   nRBC 0.2 0.0 - 0.2 %  Comprehensive metabolic panel   Collection Time: 03/10/21  5:43 PM  Result Value Ref Range   Sodium 130 (L) 135 - 145 mmol/L   Potassium 2.2 (LL) 3.5 - 5.1 mmol/L   Chloride 85 (L) 98 - 111 mmol/L   CO2 35 (H) 22 - 32 mmol/L   Glucose, Bld 103 (H) 70 - 99 mg/dL   BUN 8 6 - 20 mg/dL   Creatinine, Ser 6.07 0.44 - 1.00 mg/dL   Calcium 8.2 (L) 8.9 - 10.3 mg/dL   Total Protein 6.3 (L) 6.5 - 8.1 g/dL   Albumin 2.1 (L) 3.5 - 5.0 g/dL   AST 20 15 - 41 U/L   ALT 13 0 - 44 U/L   Alkaline Phosphatase 124 38 - 126 U/L   Total Bilirubin 1.1 0.3 - 1.2 mg/dL   GFR, Estimated >37 >10 mL/min   Anion gap 10 5 - 15  Rapid HIV screen (HIV 1/2 Ab+Ag)   Collection Time: 03/10/21  5:43 PM  Result Value Ref Range   HIV-1 P24 Antigen - HIV24 NON REACTIVE NON REACTIVE   HIV 1/2 Antibodies NON REACTIVE NON REACTIVE   Interpretation (HIV Ag Ab)      A non reactive test result means that HIV 1 or HIV 2 antibodies and HIV 1 p24 antigen were not  detected in the specimen.  Hemoglobin A1c   Collection Time: 03/10/21  5:43 PM  Result Value Ref Range   Hgb A1c MFr Bld 5.1 4.8 - 5.6 %   Mean Plasma Glucose 99.67 mg/dL  Urine rapid drug screen (hosp performed)   Collection Time: 03/10/21  6:22 PM  Result Value Ref Range   Opiates NONE DETECTED NONE DETECTED   Cocaine NONE DETECTED NONE DETECTED   Benzodiazepines POSITIVE (A) NONE DETECTED   Amphetamines NONE DETECTED NONE DETECTED   Tetrahydrocannabinol POSITIVE (A) NONE DETECTED   Barbiturates NONE DETECTED NONE DETECTED    Imaging Studies: No results found.   Assessment and Plan: Patient Active Problem List   Diagnosis Date Noted  . Malnutrition of moderate degree 03/10/2021  . Elevated blood-pressure reading without diagnosis of hypertension 02/02/2021  . Anxiety 02/02/2021  . Supervision of high risk pregnancy, antepartum 12/24/2020  . Abscess of wrist 03/11/2020  . Substance use disorder 03/11/2020  . Cellulitis and abscess of hand   . 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HCC)   . Iron deficiency anemia 04/30/2018  . Substance abuse affecting pregnancy, antepartum 10/25/2017  . History of preterm delivery, currently pregnant in second trimester 10/25/2017  . Hepatitis C 10/23/2017  . Preterm premature rupture of membranes (PPROM) with unknown onset of labor 05/12/2014  . Encephalopathy, toxic 10/03/2013  . Benzodiazepine abuse (HCC) 10/03/2013  . Heroin abuse (HCC)    Admit to Antenatal Routine antenatal care  #Severe opioid use  disorder #Polysubstance use disorder Patient presented to clinic without an appointment with symptoms of withdrawal. I have attempted to engage patient in care and stabilize her on suboxone as an outpatient without success for the past two months. Patient insightful that her OUD is not well controlled. She is at extremely high risk for overdose and death in her current state having already had a near fatal overdose earlier this year and as such requires  inpatient management. She acknowledges that suboxone has not been effective and we discussed a trial of methadone as the more regimented structure of these clinics may be beneficial for her. She is in agreement with this plan.  - Obtain UDS - Start COWS scoring system - Methadone 10 mg PO q1hr PRN up to 40 mg in the first 24 hours - Tomorrow can give AM dose equal to amount she received the day prior, as well as an additional 10 mg PO for symptoms of withdrawal - will subsequently need to wait 72 hours for dose increases to allow time for methadone to reach a steady state and avoid excessive sedation - obtain ECG to screen for QT disorder - consult TOC for assistance with enrollment in methadone clinic - comfort meds PRN for withdrawal (zofran, immodium)  #Weight loss #N/v 14 lb weight loss since I last saw her in clinic. Multifactorial in setting of possible hyperemesis and uncontrolled OUD. - nutrition consult - Clear liquid diet, advance diet as tolerated - IV PPI daily  #Hypokalemia Secondary to n/v and poor nutrition, will replenish IV.  #Supervision of high risk pregnancy Has not been engaging in regular prenatal care in clinic. - third trimester labs ordered - A1c normal, consider formal 2hr GTT pending clinical course - TDaP ordered - ordered for repeat growth scan which has been completed. Review of images shows subjectively low AFI through total is low normal at 8cm, will give IVF overnight  Venora MaplesMatthew M Briyanna Billingham, MD/MPH Attending Family Medicine Physician, [facility], [facility]

## 2021-03-11 ENCOUNTER — Encounter (HOSPITAL_COMMUNITY): Payer: Self-pay | Admitting: Family Medicine

## 2021-03-11 DIAGNOSIS — O98413 Viral hepatitis complicating pregnancy, third trimester: Secondary | ICD-10-CM | POA: Diagnosis not present

## 2021-03-11 DIAGNOSIS — Z362 Encounter for other antenatal screening follow-up: Secondary | ICD-10-CM | POA: Diagnosis not present

## 2021-03-11 DIAGNOSIS — F1910 Other psychoactive substance abuse, uncomplicated: Secondary | ICD-10-CM | POA: Diagnosis not present

## 2021-03-11 DIAGNOSIS — B182 Chronic viral hepatitis C: Secondary | ICD-10-CM

## 2021-03-11 DIAGNOSIS — O99323 Drug use complicating pregnancy, third trimester: Secondary | ICD-10-CM

## 2021-03-11 DIAGNOSIS — Z3A30 30 weeks gestation of pregnancy: Secondary | ICD-10-CM

## 2021-03-11 DIAGNOSIS — F19139 Other psychoactive substance abuse with withdrawal, unspecified: Secondary | ICD-10-CM | POA: Diagnosis not present

## 2021-03-11 DIAGNOSIS — F1513 Other stimulant abuse with withdrawal: Secondary | ICD-10-CM | POA: Diagnosis not present

## 2021-03-11 DIAGNOSIS — F1120 Opioid dependence, uncomplicated: Secondary | ICD-10-CM | POA: Diagnosis present

## 2021-03-11 DIAGNOSIS — O2513 Malnutrition in pregnancy, third trimester: Secondary | ICD-10-CM | POA: Diagnosis not present

## 2021-03-11 LAB — PAIN MGT SCRN (14 DRUGS), UR
Amphetamine Scrn, Ur: NEGATIVE ng/mL
BARBITURATE SCREEN URINE: NEGATIVE ng/mL
BENZODIAZEPINE SCREEN, URINE: POSITIVE ng/mL — AB
Buprenorphine, Urine: NEGATIVE ng/mL
CANNABINOIDS UR QL SCN: POSITIVE ng/mL — AB
Cocaine (Metab) Scrn, Ur: NEGATIVE ng/mL
Creatinine(Crt), U: 58.9 mg/dL (ref 20.0–300.0)
Fentanyl, Urine: POSITIVE pg/mL — AB
Meperidine Screen, Urine: NEGATIVE ng/mL
Methadone Screen, Urine: NEGATIVE ng/mL
OXYCODONE+OXYMORPHONE UR QL SCN: NEGATIVE ng/mL
Opiate Scrn, Ur: NEGATIVE ng/mL
Ph of Urine: 7.2 (ref 4.5–8.9)
Phencyclidine Qn, Ur: NEGATIVE ng/mL
Propoxyphene Scrn, Ur: NEGATIVE ng/mL
Tramadol Screen, Urine: NEGATIVE ng/mL

## 2021-03-11 LAB — COMPREHENSIVE METABOLIC PANEL
ALT: 13 U/L (ref 0–44)
AST: 16 U/L (ref 15–41)
Albumin: 2 g/dL — ABNORMAL LOW (ref 3.5–5.0)
Alkaline Phosphatase: 121 U/L (ref 38–126)
Anion gap: 8 (ref 5–15)
BUN: 5 mg/dL — ABNORMAL LOW (ref 6–20)
CO2: 30 mmol/L (ref 22–32)
Calcium: 8.4 mg/dL — ABNORMAL LOW (ref 8.9–10.3)
Chloride: 96 mmol/L — ABNORMAL LOW (ref 98–111)
Creatinine, Ser: 0.76 mg/dL (ref 0.44–1.00)
GFR, Estimated: >60 mL/min (ref >60)
Glucose, Bld: 124 mg/dL — ABNORMAL HIGH (ref 70–99)
Potassium: 3.2 mmol/L — ABNORMAL LOW (ref 3.5–5.1)
Sodium: 134 mmol/L — ABNORMAL LOW (ref 135–145)
Total Bilirubin: 0.7 mg/dL (ref 0.3–1.2)
Total Protein: 6.1 g/dL — ABNORMAL LOW (ref 6.5–8.1)

## 2021-03-11 LAB — RPR: RPR Ser Ql: NONREACTIVE

## 2021-03-11 LAB — PROTEIN / CREATININE RATIO, URINE
Creatinine, Urine: 58.57 mg/dL
Protein Creatinine Ratio: 0.41 mg/mg{Cre} — ABNORMAL HIGH (ref 0.00–0.15)
Total Protein, Urine: 24 mg/dL

## 2021-03-11 MED ORDER — METHADONE HCL 10 MG/ML PO CONC
20.0000 mg | Freq: Two times a day (BID) | ORAL | Status: DC
  Administered 2021-03-11 (×2): 20 mg via ORAL
  Filled 2021-03-11 (×2): qty 2

## 2021-03-11 MED ORDER — ACETAMINOPHEN 500 MG PO TABS
1000.0000 mg | ORAL_TABLET | Freq: Four times a day (QID) | ORAL | Status: DC | PRN
  Administered 2021-03-11 – 2021-03-15 (×3): 1000 mg via ORAL
  Filled 2021-03-11 (×3): qty 2

## 2021-03-11 MED ORDER — METHADONE HCL 10 MG PO TABS
10.0000 mg | ORAL_TABLET | ORAL | Status: DC | PRN
Start: 2021-03-11 — End: 2021-03-11

## 2021-03-11 MED ORDER — POTASSIUM CHLORIDE 10 MEQ/100ML IV SOLN
INTRAVENOUS | Status: AC
  Administered 2021-03-11: 10 meq via INTRAVENOUS
  Filled 2021-03-11: qty 100

## 2021-03-11 MED ORDER — METHADONE HCL 10 MG/ML PO CONC
10.0000 mg | Freq: Once | ORAL | Status: AC | PRN
  Administered 2021-03-12: 10 mg via ORAL
  Filled 2021-03-11: qty 1

## 2021-03-11 MED ORDER — POTASSIUM CHLORIDE 10 MEQ/100ML IV SOLN: 10.0000 meq | Freq: Once | INTRAVENOUS | Status: DC

## 2021-03-11 MED ORDER — METHADONE HCL 10 MG/ML PO CONC
40.0000 mg | Freq: Every day | ORAL | Status: DC
  Filled 2021-03-11: qty 4

## 2021-03-11 MED ORDER — METHADONE HCL 10 MG PO TABS
40.0000 mg | ORAL_TABLET | Freq: Every day | ORAL | Status: DC
  Filled 2021-03-11 (×2): qty 4

## 2021-03-11 NOTE — Progress Notes (Signed)
Pt was given 40mg  oral methadone tablets and vomited 500cc emesis 5 minutes after. RN unable to locate tablets in patient's emesis which was mixed with breakfast from this morning. Dr. made aware and methadone suspensions ordered, doses spread apart to prevent overdose. Pharmacy made aware of situation and since no physical pills were retrieved from patient's vomit, none were wasted in the pyxis.
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
## 2021-03-12 ENCOUNTER — Encounter (HOSPITAL_COMMUNITY): Payer: Self-pay | Admitting: Family Medicine

## 2021-03-12 DIAGNOSIS — F1120 Opioid dependence, uncomplicated: Secondary | ICD-10-CM

## 2021-03-12 DIAGNOSIS — F1990 Other psychoactive substance use, unspecified, uncomplicated: Secondary | ICD-10-CM

## 2021-03-12 DIAGNOSIS — O1403 Mild to moderate pre-eclampsia, third trimester: Principal | ICD-10-CM

## 2021-03-12 MED ORDER — METHADONE HCL 5 MG/5ML PO SOLN
50.0000 mg | Freq: Every day | ORAL | Status: DC
Start: 2021-03-13 — End: 2021-03-12

## 2021-03-12 MED ORDER — HYDROXYZINE HCL 50 MG PO TABS
50.0000 mg | ORAL_TABLET | Freq: Four times a day (QID) | ORAL | Status: DC | PRN
  Administered 2021-03-12 – 2021-03-15 (×9): 50 mg via ORAL
  Filled 2021-03-12 (×9): qty 1

## 2021-03-12 MED ORDER — METHADONE HCL 10 MG/ML PO CONC
40.0000 mg | Freq: Once | ORAL | Status: AC
  Administered 2021-03-12: 40 mg via ORAL
  Filled 2021-03-12: qty 4

## 2021-03-12 MED ORDER — METHADONE HCL 10 MG/ML PO CONC
50.0000 mg | Freq: Every day | ORAL | Status: DC
  Administered 2021-03-13 – 2021-03-14 (×2): 50 mg via ORAL
  Filled 2021-03-12 (×2): qty 5

## 2021-03-12 NOTE — Progress Notes (Addendum)
Pt reports feeling increased anxiety, agitation, and irritability and would like medication to treat those symptoms.  RN notes increased restlessness compared to earlier this morning.  Pt states that Valium and Klonopin have worked for her in the past.  Phoned Dr. Macon Large with above information.  She will review MAR and decide what medication may be a good option at this time.

## 2021-03-12 NOTE — Progress Notes (Signed)
Talked Dr. Crissie Reese about patient's symptoms.  She is currently on Methadone 50 mg daily.  He is hesitant to increase dosage of this given its long half-life and risk of overdosing.  If patient exhibits withdrawal symptoms not alleviated by Vistaril, can consider Clonidine 0.1 mg po bid prn withdrawal symptoms. If further management is needed, can consider short acting opioid such as Dilaudid 1-2 mg IV/PO q6h as needed.  But we should try to maintain dosage of Methadone at 50 mg until at least 03/15/21.  Appreciate the expertise from Dr. Crissie Reese with the management of this patient.   Jaynie Collins, MD, FACOG Obstetrician & Gynecologist, [facility] for [employer], [facility]

## 2021-03-12 NOTE — Progress Notes (Signed)
CSW met with patient and provided a 31 day bus pass. CSW assisted patient in registering 31 day bus pass.  CSW provided patient with contact information for [facility] for [facility] to call and reschedule appointment with behavioral health specialist. CSW obtained patient's signature on release of information form to provide requested documents to [facility]. CSW inquired about any additional needs/concerns, patient reported none and thanked CSW.   Abundio Miu, [place] Worker [facility] Cell#: [phone]

## 2021-03-12 NOTE — Progress Notes (Signed)
FACULTY PRACTICE ANTEPARTUM COMPREHENSIVE PROGRESS NOTE  Meghan Bullock is a 30 y.o. [id number] at [redacted]w[redacted]d who is admitted for weight loss and uncontrolled severe opioid use disorder.  Estimated Date of Delivery: 05/15/21 Fetal presentation is cephalic.  Length of Stay:  2 Days. Admitted 03/10/2021  Subjective: She reports she feels significantly better today with methadone. She took extra 10 mg dose of Methadone overnight. Feels like she needs to go up on the dose of methadone.   Patient reports good fetal movement.  She reports no uterine contractions, no bleeding and no loss of fluid per vagina.  Vitals:  Blood pressure 130/76, pulse (!) 58, temperature 97.6 F (36.4 C), temperature source Oral, resp. rate 17, height 5\' 4"  (1.626 m), weight 67.1 kg, last menstrual period 08/08/2020, SpO2 97 %. Physical Examination: CONSTITUTIONAL: Disheveled, appears older than stated age.  HENT:  Normocephalic, atraumatic EYES: Conjunctivae and EOM are normal. No scleral icterus.  NECK: Normal range of motion, supple, no masses SKIN: Skin is warm and dry. No rash noted on limited exam. Not diaphoretic. No erythema. No pallor. NEUROLGIC: Alert and oriented to person, place, and time.  PSYCHIATRIC: Anxious affect. Normal behavior. Normal judgment and thought content. RESPIRATORY: Effort normal, no problems with respiration noted   Cervix: Not evaluated Fetal monitoring:  Fetal Heart Rate A   Mode External filed at 03/11/2021 1159  Baseline Rate (A) 125 bpm filed at 03/11/2021 1159  Variability 6-25 BPM filed at 03/11/2021 1159  Accelerations 15 x 15 filed at 03/11/2021 1159  Decelerations None filed at 03/11/2021 1159  Reactive, Category I  Labs: Results for orders placed or performed during the hospital encounter of 03/10/21 (from the past 48 hour(s))  SARS CORONAVIRUS 2 (TAT 6-24 HRS) Nasopharyngeal Nasopharyngeal Swab     Status: None   Collection Time: 03/10/21  5:09 PM   Specimen:  Nasopharyngeal Swab  Result Value Ref Range   SARS Coronavirus 2 NEGATIVE NEGATIVE    Comment: (NOTE) SARS-CoV-2 target nucleic acids are NOT DETECTED.  The SARS-CoV-2 RNA is generally detectable in upper and lower respiratory specimens during the acute phase of infection. Negative results do not preclude SARS-CoV-2 infection, do not rule out co-infections with other pathogens, and should not be used as the sole basis for treatment or other patient management decisions. Negative results must be combined with clinical observations, patient history, and epidemiological information. The expected result is Negative.  Fact Sheet for Patients: 03/12/21  Fact Sheet for Healthcare Providers: HairSlick.no  This test is not yet approved or cleared by the quierodirigir.com FDA and  has been authorized for detection and/or diagnosis of SARS-CoV-2 by FDA under an Emergency Use Authorization (EUA). This EUA will remain  in effect (meaning this test can be used) for the duration of the COVID-19 declaration under Se ction 564(b)(1) of the Act, 21 U.S.C. section 360bbb-3(b)(1), unless the authorization is terminated or revoked sooner.  Performed at [facility], [address], [place], [place]   Type and screen [facility]     Status: None   Collection Time: 03/10/21  5:39 PM  Result Value Ref Range   ABO/RH(D) O POS    Antibody Screen NEG    Sample Expiration      03/13/2021,2359 Performed at [facility], [address], [place], [place]   CBC on admission     Status: Abnormal   Collection Time: 03/10/21  5:43 PM  Result Value Ref Range  WBC 13.4 (H) 4.0 - 10.5 K/uL   RBC 4.60 3.87 - 5.11 MIL/uL   Hemoglobin 10.2 (L) 12.0 - 15.0 g/dL   HCT 16.1 (L) 09.6 - 04.5 %   MCV 71.1 (L) 80.0 - 100.0 fL   MCH 22.2 (L) 26.0 - 34.0 pg   MCHC 31.2 30.0 - 36.0 g/dL   RDW 40.9 (H) 81.1 -  15.5 %   Platelets 162 150 - 400 K/uL    Comment: REPEATED TO VERIFY   nRBC 0.2 0.0 - 0.2 %    Comment: Performed at [facility], [address], [place], [place] [address]  Comprehensive metabolic panel     Status: Abnormal   Collection Time: 03/10/21  5:43 PM  Result Value Ref Range   Sodium 130 (L) 135 - 145 mmol/L   Potassium 2.2 (LL) 3.5 - 5.1 mmol/L    Comment: CRITICAL RESULT CALLED TO, READ BACK BY AND VERIFIED WITHJudie Petit [facility] RN [phone] K FORSYTH    Chloride 85 (L) 98 - 111 mmol/L   CO2 35 (H) 22 - 32 mmol/L   Glucose, Bld 103 (H) 70 - 99 mg/dL    Comment: Glucose reference range applies only to samples taken after fasting for at least 8 hours.   BUN 8 6 - 20 mg/dL   Creatinine, Ser 3.08 0.44 - 1.00 mg/dL   Calcium 8.2 (L) 8.9 - 10.3 mg/dL   Total Protein 6.3 (L) 6.5 - 8.1 g/dL   Albumin 2.1 (L) 3.5 - 5.0 g/dL   AST 20 15 - 41 U/L   ALT 13 0 - 44 U/L   Alkaline Phosphatase 124 38 - 126 U/L   Total Bilirubin 1.1 0.3 - 1.2 mg/dL   GFR, Estimated >65 >78 mL/min    Comment: (NOTE) Calculated using the CKD-EPI Creatinine Equation (2021)    Anion gap 10 5 - 15    Comment: Performed at [facility], [address] [place], [place] [address]  RPR     Status: None   Collection Time: 03/10/21  5:43 PM  Result Value Ref Range   RPR Ser Ql NON REACTIVE NON REACTIVE    Comment: Performed at [facility], [address], [place], [place] [address]  Rapid HIV screen (HIV 1/2 Ab+Ag)     Status: None   Collection Time: 03/10/21  5:43 PM  Result Value Ref Range   HIV-1 P24 Antigen - HIV24 NON REACTIVE NON REACTIVE    Comment: (NOTE) Detection of p24 may be inhibited by biotin in the sample, causing false negative results in acute infection.    HIV 1/2 Antibodies NON REACTIVE NON REACTIVE   Interpretation (HIV Ag Ab)      A non reactive test result means that HIV 1 or HIV 2 antibodies and HIV 1 p24 antigen were not detected in the specimen.     Comment: Performed at [facility], [address] [place], [place] [address]  Hemoglobin A1c     Status: None   Collection Time: 03/10/21  5:43 PM  Result Value Ref Range   Hgb A1c MFr Bld 5.1 4.8 - 5.6 %    Comment: (NOTE) Pre diabetes:          5.7%-6.4%  Diabetes:              >6.4%  Glycemic control for   <7.0% adults with diabetes    Mean Plasma Glucose 99.67 mg/dL  Comment: Performed at [facility], [address], [place], [place] [address]  Urine rapid drug screen (hosp performed)     Status: Abnormal   Collection Time: 03/10/21  6:22 PM  Result Value Ref Range   Opiates NONE DETECTED NONE DETECTED   Cocaine NONE DETECTED NONE DETECTED   Benzodiazepines POSITIVE (A) NONE DETECTED   Amphetamines NONE DETECTED NONE DETECTED   Tetrahydrocannabinol POSITIVE (A) NONE DETECTED   Barbiturates NONE DETECTED NONE DETECTED    Comment: (NOTE) DRUG SCREEN FOR MEDICAL PURPOSES ONLY.  IF CONFIRMATION IS NEEDED FOR ANY PURPOSE, NOTIFY LAB WITHIN 5 DAYS.  LOWEST DETECTABLE LIMITS FOR URINE DRUG SCREEN Drug Class                     Cutoff (ng/mL) Amphetamine and metabolites    1000 Barbiturate and metabolites    200 Benzodiazepine                 200 Tricyclics and metabolites     300 Opiates and metabolites        300 Cocaine and metabolites        300 THC                            50 Performed at [facility], [address], [place], [place] [address]   Comprehensive metabolic panel     Status: Abnormal   Collection Time: 03/11/21  8:17 AM  Result Value Ref Range   Sodium 134 (L) 135 - 145 mmol/L   Potassium 3.2 (L) 3.5 - 5.1 mmol/L   Chloride 96 (L) 98 - 111 mmol/L   CO2 30 22 - 32 mmol/L   Glucose, Bld 124 (H) 70 - 99 mg/dL    Comment: Glucose reference range applies only to samples taken after fasting for at least 8 hours.   BUN 5 (L) 6 - 20 mg/dL   Creatinine, Ser 2.77 0.44 - 1.00 mg/dL   Calcium 8.4 (L) 8.9 - 10.3 mg/dL   Total Protein  6.1 (L) 6.5 - 8.1 g/dL   Albumin 2.0 (L) 3.5 - 5.0 g/dL   AST 16 15 - 41 U/L   ALT 13 0 - 44 U/L   Alkaline Phosphatase 121 38 - 126 U/L   Total Bilirubin 0.7 0.3 - 1.2 mg/dL   GFR, Estimated >82 >42 mL/min    Comment: (NOTE) Calculated using the CKD-EPI Creatinine Equation (2021)    Anion gap 8 5 - 15    Comment: Performed at [facility], [address], [place], [place] [address]  Protein / creatinine ratio, urine     Status: Abnormal   Collection Time: 03/11/21  9:52 AM  Result Value Ref Range   Creatinine, Urine 58.57 mg/dL   Total Protein, Urine 24 mg/dL    Comment: NO NORMAL RANGE ESTABLISHED FOR THIS TEST   Protein Creatinine Ratio 0.41 (H) 0.00 - 0.15 mg/mg[Cre]    Comment: Performed at [facility], [address], [place], [place] [address]    [place] MFM FETAL BPP WO NON STRESS  Result Date: 03/11/2021 ----------------------------------------------------------------------  OBSTETRICS REPORT                       (Signed Final 03/11/2021 10:53 am) ---------------------------------------------------------------------- Patient Info  ID #:       [id number]  D.O.B.:  01/02/1991 (29 yrs)  Name:       DAROLYN DOUBLE             Visit Date: 03/10/2021 05:22 pm ---------------------------------------------------------------------- Performed By  Attending:        Lin Landsman      Ref. [facility]  Performed By:     Percell Boston          [facility]  Referred By:      Warden Fillers MD ---------------------------------------------------------------------- Orders  #  Description                           Code        Ordered By  1  [place] MFM OB FOLLOW UP                    76816.01    MATTHEW ECKSTAT  2  [place] MFM FETAL BPP WO NON               76819.01    MATTHEW ECKSTAT     STRESS ----------------------------------------------------------------------  #  Order #                     Accession #                Episode #  1  [id number]                   [id number]                 [id number]  2  [id number]                   [id number]                 [id number] ---------------------------------------------------------------------- Indications  Substance abuse affecting pregnancy,           O99.320 F19.10  antepartum(Heroin/Benzo)  [redacted] weeks gestation of pregnancy                Z3A.30  Chronic Hepatitis  C complicating pregnancy,    O98.419 B18.2  antepartum  Suboxone use                                   O99.320  Encounter for other antenatal screening        Z36.2  follow-up ---------------------------------------------------------------------- Fetal Evaluation  Num Of Fetuses:         1  Fetal Heart Rate(bpm):  145  Cardiac Activity:       Observed  Presentation:           Cephalic  Placenta:               Anterior  P. Cord Insertion:      Previously Visualized  Amniotic Fluid  AFI FV:      Subjectively decreased  AFI Sum(cm)     %Tile       Largest Pocket(cm)  8.6             4.3         2.5  RUQ(cm)       RLQ(cm)       LUQ(cm)        LLQ(cm)  1.9           2.4           1.8            2.5 ---------------------------------------------------------------------- Biophysical Evaluation  Amniotic F.V:   Pocket => 2 cm             F. Tone:        Observed  F. Movement:    Observed                   Score:          8/8  F. Breathing:   Observed ---------------------------------------------------------------------- Biometry  BPD:      76.3  mm     G. Age:  30w 4d         39  %    CI:        72.82   %    70 - 86                                                          FL/HC:      19.9   %    19.3 - 21.3  HC:      284.3  mm     G. Age:  31w 1d         31  %    HC/AC:      1.07        0.96 - 1.17  AC:       265.2  mm     G. Age:  30w 4d         48  %    FL/BPD:     74.2   %    71 - 87  FL:       56.6  mm     G. Age:  29w 5d         15  %    FL/AC:      21.3   %    20 -  24  Est. FW:    1566  gm      3 lb 7 oz     32  % ---------------------------------------------------------------------- OB History  Gravidity:    4         Term:   1        Prem:   1        SAB:   1  Living:       2 ---------------------------------------------------------------------- Gestational Age  LMP:           30w 4d        Date:  08/08/20                 EDD:   05/15/21  Clinical EDD:  30w 4d                                        EDD:   05/15/21  U/S Today:     30w 4d                                        EDD:   05/15/21  Best:          30w 4d     Det. By:  LMP  (08/08/20)          EDD:   05/15/21 ---------------------------------------------------------------------- Anatomy  Cranium:               Appears normal         Aortic Arch:            Appears normal  Cavum:                 Appears normal         Ductal Arch:            Previously seen  Ventricles:            Appears normal         Diaphragm:              Appears normal  Choroid Plexus:        Previously seen        Stomach:                Appears normal, left                                                                        sided  Cerebellum:            Appears normal         Abdomen:                Appears normal  Posterior Fossa:       Appears normal         Abdominal Wall:         Previously seen  Nuchal Fold:           Previously seen        Cord Vessels:  Previously seen  Face:                  Orbits nl; profile not Kidneys:                Previously seen                         well visualized  Lips:                  Appears normal         Bladder:                Appears normal  Thoracic:              Appears normal         Spine:                  Ltd views no                                                                        intracranial signs of                                                                         NTD  Heart:                 Not well visualized    Upper Extremities:      Appears normal  RVOT:                  Not well visualized    Lower Extremities:      Appears normal  LVOT:                  Appears normal  Other:  Appears to be female. Technically difficult due to maternal habitus and          fetal position. ---------------------------------------------------------------------- Cervix Uterus Adnexa  Cervix  Not visualized (advanced GA >24wks)  Uterus  No abnormality visualized.  Right Ovary  No adnexal mass visualized.  Left Ovary  No adnexal mass visualized.  Cul De Sac  No free fluid seen.  Adnexa  No abnormality visualized. ---------------------------------------------------------------------- Impression  Follow up growth due to substance abuse with recent opioid  relapse.  Normal interval growth with measurements consistent with  dates  Good fetal movement and objectively normal amniotic fluid  volume, but subjectively decreased amniotic fluid.  Biophysical profile 8/8 ---------------------------------------------------------------------- Recommendations  Follow up growth in 4 weeks. ----------------------------------------------------------------------               CoreLin Landsmannthian Booker, MD Electronically Signed Final Report   03/11/2021 10:53 am ----------------------------------------------------------------------  US MFM OB FOLLOW UP  Result Date: 03/11/2021 ----------------------------------------------------------------------  OBSTETRICS REPORT                       (Signed Final 03/11/2021 10:53 am) ---------------------------------------------------------------------- Patient Info  ID #:  [id number]                          D.O.B.:  [birth year] (29 yrs)  Name:       Meghan Bullock             Visit Date: 03/10/2021 05:22 pm ---------------------------------------------------------------------- Performed By  Attending:         Lin Landsman      Ref. Address[facility]     [facility]  Performed By:     Percell Boston          [facility]  Referred By:      Warden Fillers MD ---------------------------------------------------------------------- Orders  #  Description                           Code        Ordered By  1  [place] MFM OB FOLLOW UP                   76816.01    MATTHEW ECKSTAT  2  [place] MFM FETAL BPP WO NON               40981.19    MATTHEW ECKSTAT     STRESS ----------------------------------------------------------------------  #  Order #                     Accession #                Episode #  1  [id number]                   [id number]                 [id number]  2  [id number]                   [id number]                 [id number] ---------------------------------------------------------------------- Indications  Substance abuse affecting pregnancy,           O99.320 F19.10  antepartum(Heroin/Benzo)  [redacted] weeks gestation of pregnancy                Z3A.30  Chronic Hepatitis C complicating pregnancy,    O98.419 B18.2  antepartum  Suboxone use                                   O99.320  Encounter for other antenatal screening        Z36.2  follow-up ---------------------------------------------------------------------- Fetal Evaluation  Num Of Fetuses:         1  Fetal Heart Rate(bpm):  145  Cardiac Activity:       Observed  Presentation:           Cephalic  Placenta:               Anterior  P. Cord Insertion:      Previously Visualized  Amniotic Fluid  AFI FV:      Subjectively decreased  AFI Sum(cm)     %Tile       Largest Pocket(cm)  8.6             4.3         2.5  RUQ(cm)       RLQ(cm)       LUQ(cm)        LLQ(cm)  1.9           2.4           1.8            2.5  ---------------------------------------------------------------------- Biophysical Evaluation  Amniotic F.V:   Pocket => 2 cm             F. Tone:        Observed  F. Movement:    Observed                   Score:          8/8  F. Breathing:   Observed ---------------------------------------------------------------------- Biometry  BPD:      76.3  mm     G. Age:  30w 4d         39  %    CI:        72.82   %    70 - 86                                                          FL/HC:      19.9   %    19.3 - 21.3  HC:      284.3  mm     G. Age:  31w 1d         31  %    HC/AC:      1.07        0.96 - 1.17  AC:      265.2  mm     G. Age:  30w 4d         48  %    FL/BPD:     74.2   %    71 - 87  FL:       56.6  mm     G. Age:  29w 5d  15  %    FL/AC:      21.3   %    20 - 24  Est. FW:    1566  gm      3 lb 7 oz     32  % ---------------------------------------------------------------------- OB History  Gravidity:    4         Term:   1        Prem:   1        SAB:   1  Living:       2 ---------------------------------------------------------------------- Gestational Age  LMP:           30w 4d        Date:  08/08/20                 EDD:   05/15/21  Clinical EDD:  30w 4d                                        EDD:   05/15/21  U/S Today:     30w 4d                                        EDD:   05/15/21  Best:          30w 4d     Det. By:  LMP  (08/08/20)          EDD:   05/15/21 ---------------------------------------------------------------------- Anatomy  Cranium:               Appears normal         Aortic Arch:            Appears normal  Cavum:                 Appears normal         Ductal Arch:            Previously seen  Ventricles:            Appears normal         Diaphragm:              Appears normal  Choroid Plexus:        Previously seen        Stomach:                Appears normal, left                                                                        sided  Cerebellum:            Appears normal          Abdomen:                Appears normal  Posterior Fossa:       Appears normal         Abdominal Wall:         Previously seen  Nuchal Fold:  Previously seen        Cord Vessels:           Previously seen  Face:                  Orbits nl; profile not Kidneys:                Previously seen                         well visualized  Lips:                  Appears normal         Bladder:                Appears normal  Thoracic:              Appears normal         Spine:                  Ltd views no                                                                        intracranial signs of                                                                        NTD  Heart:                 Not well visualized    Upper Extremities:      Appears normal  RVOT:                  Not well visualized    Lower Extremities:      Appears normal  LVOT:                  Appears normal  Other:  Appears to be female. Technically difficult due to maternal habitus and          fetal position. ---------------------------------------------------------------------- Cervix Uterus Adnexa  Cervix  Not visualized (advanced GA >24wks)  Uterus  No abnormality visualized.  Right Ovary  No adnexal mass visualized.  Left Ovary  No adnexal mass visualized.  Cul De Sac  No free fluid seen.  Adnexa  No abnormality visualized. ---------------------------------------------------------------------- Impression  Follow up growth due to substance abuse with recent opioid  relapse.  Normal interval growth with measurements consistent with  dates  Good fetal movement and objectively normal amniotic fluid  volume, but subjectively decreased amniotic fluid.  Biophysical profile 8/8 ---------------------------------------------------------------------- Recommendations  Follow up growth in 4 weeks. ----------------------------------------------------------------------               Lin Landsman, MD Electronically Signed Final Report   03/11/2021  10:53 am ----------------------------------------------------------------------   Current scheduled medications . cephALEXin  500 mg Oral Q8H  . docusate sodium  100 mg Oral Daily  . feeding supplement  237 mL Oral BID BM  . [START ON 03/13/2021] methadone  50 mg Oral Daily  . nicotine  14 mg Transdermal Daily  . pantoprazole (PROTONIX) IV  40 mg Intravenous Q24H  . prenatal multivitamin  1 tablet Oral Q1200    I have reviewed the patient's current medications.  ASSESSMENT: Active Problems:   Malnutrition of moderate degree   Opioid use disorder, severe, dependence (HCC)   PLAN: #Severe opioid use disorder #Polysubstance use disorder - Managed by Dr. Crissie Reese, please refer to the note on 03/11/21 for further details. Methadone dose increased to 50 mg daily. As per Dr. Crissie Reese, this dose needs to be maintained until 4/18 - If patient requires increase on 4/18 can go up by 5-10 mg that day - TOC team has coordinated clinical intake at [facility] clinic on 4/19, needs to receive her daily dose of methadone and be discharged at ~0600 so that she can get there before 0700 for her intake - Continue comfort meds PRN for withdrawal (zofran, immodium) #PreE w/o SF Multiple mild range BP's since admission, CBC/CMP unremarkable, UPCR elevated to 0.41, will need to start weekly antenatal testing. BPP 8/8 on admission and on 03/10/21. Discussed with patient, she is aware of need for increased testing and IOL at 37 weeks.   #Weight loss #N/v #Esophagitis 14 lb weight loss, multifactorial in setting of possible hyperemesis and uncontrolled OUD. Significantly improved. - Continue to advance diet as tolerated - IV PPI daily   #Hypokalemia Improved to 3.2 yesterday, will recheck labs and replete accordingly   #Supervision of high risk pregnancy Has not been engaging in regular prenatal care in clinic. - third trimester labs unremarkable with exception of 2hr GTT - A1c normal, will do formal  2hr GTT or 1 hr GTT prior to discharge - TDaP ordered - 03/10/21 ultrasound showed EFW 1566g/32%, AFI 8.6 cm, cephalic, BPP 8/8  Continue routine antenatal care.  Dispo: inpatient, plan for discharge at 0600 on 03/16/2021 to [facility] for methadone clinic intake   Jaynie Collins, MD, FACOG Obstetrician & Gynecologist, [facility] for [employer], [facility]

## 2021-03-12 NOTE — Progress Notes (Signed)
Vistaril prescribed for now, for her anxiety, agitation, and irritability. Will monitor response.  Will also contact Dr. Crissie Reese, in case further increase to Methadone dosage is needed.  Reassuring FHR tracing, no acute obstetric concerns.     Jaynie Collins, MD, FACOG Obstetrician & Gynecologist, [facility] for [employer], [facility]

## 2021-03-13 DIAGNOSIS — Z3A31 31 weeks gestation of pregnancy: Secondary | ICD-10-CM

## 2021-03-13 DIAGNOSIS — O99323 Drug use complicating pregnancy, third trimester: Secondary | ICD-10-CM | POA: Diagnosis not present

## 2021-03-13 DIAGNOSIS — F1120 Opioid dependence, uncomplicated: Secondary | ICD-10-CM | POA: Diagnosis not present

## 2021-03-13 LAB — CBC WITH DIFFERENTIAL/PLATELET
Abs Immature Granulocytes: 0.82 10*3/uL — ABNORMAL HIGH (ref 0.00–0.07)
Basophils Absolute: 0.1 10*3/uL (ref 0.0–0.1)
Basophils Relative: 0 %
Eosinophils Absolute: 0.2 10*3/uL (ref 0.0–0.5)
Eosinophils Relative: 2 %
HCT: 31.4 % — ABNORMAL LOW (ref 36.0–46.0)
Hemoglobin: 9.4 g/dL — ABNORMAL LOW (ref 12.0–15.0)
Immature Granulocytes: 7 %
Lymphocytes Relative: 32 %
Lymphs Abs: 3.6 10*3/uL (ref 0.7–4.0)
MCH: 22.3 pg — ABNORMAL LOW (ref 26.0–34.0)
MCHC: 29.9 g/dL — ABNORMAL LOW (ref 30.0–36.0)
MCV: 74.4 fL — ABNORMAL LOW (ref 80.0–100.0)
Monocytes Absolute: 0.5 10*3/uL (ref 0.1–1.0)
Monocytes Relative: 5 %
Neutro Abs: 6.2 10*3/uL (ref 1.7–7.7)
Neutrophils Relative %: 54 %
Platelets: 213 10*3/uL (ref 150–400)
RBC: 4.22 MIL/uL (ref 3.87–5.11)
RDW: 16.2 % — ABNORMAL HIGH (ref 11.5–15.5)
WBC: 11.4 10*3/uL — ABNORMAL HIGH (ref 4.0–10.5)
nRBC: 0.4 % — ABNORMAL HIGH (ref 0.0–0.2)

## 2021-03-13 LAB — COMPREHENSIVE METABOLIC PANEL
ALT: 15 U/L (ref 0–44)
AST: 18 U/L (ref 15–41)
Albumin: 1.9 g/dL — ABNORMAL LOW (ref 3.5–5.0)
Alkaline Phosphatase: 124 U/L (ref 38–126)
Anion gap: 5 (ref 5–15)
BUN: <5 mg/dL — ABNORMAL LOW (ref 6–20)
CO2: 28 mmol/L (ref 22–32)
Calcium: 7.9 mg/dL — ABNORMAL LOW (ref 8.9–10.3)
Chloride: 101 mmol/L (ref 98–111)
Creatinine, Ser: 0.64 mg/dL (ref 0.44–1.00)
GFR, Estimated: >60 mL/min (ref >60)
Glucose, Bld: 85 mg/dL (ref 70–99)
Potassium: 3.4 mmol/L — ABNORMAL LOW (ref 3.5–5.1)
Sodium: 134 mmol/L — ABNORMAL LOW (ref 135–145)
Total Bilirubin: 0.6 mg/dL (ref 0.3–1.2)
Total Protein: 5.4 g/dL — ABNORMAL LOW (ref 6.5–8.1)

## 2021-03-13 LAB — MAGNESIUM: Magnesium: 1.5 mg/dL — ABNORMAL LOW (ref 1.7–2.4)

## 2021-03-13 MED ORDER — POTASSIUM CHLORIDE 10 MEQ/100ML IV SOLN
INTRAVENOUS | Status: AC
  Administered 2021-03-13: 10 meq via INTRAVENOUS
  Filled 2021-03-13: qty 100

## 2021-03-13 MED ORDER — POTASSIUM CHLORIDE 10 MEQ/100ML IV SOLN
10.0000 meq | INTRAVENOUS | Status: AC
  Administered 2021-03-13: 10 meq via INTRAVENOUS
  Filled 2021-03-13: qty 100

## 2021-03-13 MED ORDER — SODIUM CHLORIDE 0.9 % IV SOLN: INTRAVENOUS | Status: DC | PRN

## 2021-03-13 MED ORDER — MAGNESIUM SULFATE 2 GM/50ML IV SOLN
2.0000 g | Freq: Once | INTRAVENOUS | Status: AC
  Administered 2021-03-13: 2 g via INTRAVENOUS
  Filled 2021-03-13: qty 50

## 2021-03-13 NOTE — Progress Notes (Signed)
FACULTY PRACTICE ANTEPARTUM COMPREHENSIVE PROGRESS NOTE  Meghan Bullock is a 30 y.o. [id number] at [redacted]w[redacted]d who is admitted for weight loss and uncontrolled severe opioid use disorder.  Estimated Date of Delivery: 05/15/21 Fetal presentation is cephalic.  Length of Stay:  3 Days. Admitted 03/10/2021  Subjective: She reports she feels significantly better ; received Vistaril 50 mg x 2 yesterday for anxiety and feels this helped a lot.   Patient reports good fetal movement.  She reports no uterine contractions, no bleeding and no loss of fluid per vagina.  Vitals:  Blood pressure 139/84, pulse 61, temperature 97.7 F (36.5 C), temperature source Oral, resp. rate 18, height 5\' 4"  (1.626 m), weight 67.1 kg, last menstrual period 08/08/2020, SpO2 100 %. Physical Examination: CONSTITUTIONAL: No apparent distress.  HENT:  Normocephalic, atraumatic EYES: Conjunctivae and EOM are normal. No scleral icterus.  NECK: Normal range of motion, supple, no masses SKIN: Skin is warm and dry. No rash noted on limited exam. Not diaphoretic. No erythema. No pallor. NEUROLGIC: Alert and oriented to person, place, and time.  PSYCHIATRIC: Anxious affect. Normal behavior. Normal judgment and thought content. RESPIRATORY: Effort normal, no problems with respiration noted   Cervix: Not evaluated Fetal monitoring:  Fetal Heart Rate A   Mode External filed at 03/12/2021 1220  Baseline Rate (A) 110 bpm filed at 03/12/2021 1220  Variability 6-25 BPM filed at 03/12/2021 1220  Accelerations 15 x 15 filed at 03/12/2021 1220  Decelerations Variable filed at 03/12/2021 1220  Reactive, Category I  Labs: Results for orders placed or performed during the hospital encounter of 03/10/21 (from the past 48 hour(s))  Comprehensive metabolic panel     Status: Abnormal   Collection Time: 03/11/21  8:17 AM  Result Value Ref Range   Sodium 134 (L) 135 - 145 mmol/L   Potassium 3.2 (L) 3.5 - 5.1 mmol/L   Chloride 96 (L) 98 -  111 mmol/L   CO2 30 22 - 32 mmol/L   Glucose, Bld 124 (H) 70 - 99 mg/dL    Comment: Glucose reference range applies only to samples taken after fasting for at least 8 hours.   BUN 5 (L) 6 - 20 mg/dL   Creatinine, Ser 03/13/21 0.44 - 1.00 mg/dL   Calcium 8.4 (L) 8.9 - 10.3 mg/dL   Total Protein 6.1 (L) 6.5 - 8.1 g/dL   Albumin 2.0 (L) 3.5 - 5.0 g/dL   AST 16 15 - 41 U/L   ALT 13 0 - 44 U/L   Alkaline Phosphatase 121 38 - 126 U/L   Total Bilirubin 0.7 0.3 - 1.2 mg/dL   GFR, Estimated 3.26 >71 mL/min    Comment: (NOTE) Calculated using the CKD-EPI Creatinine Equation (2021)    Anion gap 8 5 - 15    Comment: Performed at [facility], [address] [place], [place]  Protein / creatinine ratio, urine     Status: Abnormal   Collection Time: 03/11/21  9:52 AM  Result Value Ref Range   Creatinine, Urine 58.57 mg/dL   Total Protein, Urine 24 mg/dL    Comment: NO NORMAL RANGE ESTABLISHED FOR THIS TEST   Protein Creatinine Ratio 0.41 (H) 0.00 - 0.15 mg/mg[Cre]    Comment: Performed at [facility], [address], [place], [place]  Comprehensive metabolic panel     Status: Abnormal   Collection Time: 03/13/21  4:15 AM  Result Value Ref Range   Sodium 134 (L) 135 -  145 mmol/L   Potassium 3.4 (L) 3.5 - 5.1 mmol/L   Chloride 101 98 - 111 mmol/L   CO2 28 22 - 32 mmol/L   Glucose, Bld 85 70 - 99 mg/dL    Comment: Glucose reference range applies only to samples taken after fasting for at least 8 hours.   BUN <5 (L) 6 - 20 mg/dL   Creatinine, Ser 5.36 0.44 - 1.00 mg/dL   Calcium 7.9 (L) 8.9 - 10.3 mg/dL   Total Protein 5.4 (L) 6.5 - 8.1 g/dL   Albumin 1.9 (L) 3.5 - 5.0 g/dL   AST 18 15 - 41 U/L   ALT 15 0 - 44 U/L   Alkaline Phosphatase 124 38 - 126 U/L   Total Bilirubin 0.6 0.3 - 1.2 mg/dL   GFR, Estimated >64 >40 mL/min    Comment: (NOTE) Calculated using the CKD-EPI Creatinine Equation (2021)    Anion gap 5 5 - 15    Comment: Performed at [facility], [address] [place], [place] [address]  CBC with Differential/Platelet     Status: Abnormal   Collection Time: 03/13/21  4:15 AM  Result Value Ref Range   WBC 11.4 (H) 4.0 - 10.5 K/uL   RBC 4.22 3.87 - 5.11 MIL/uL   Hemoglobin 9.4 (L) 12.0 - 15.0 g/dL   HCT 59.5 (L) 63.8 - 75.6 %   MCV 74.4 (L) 80.0 - 100.0 fL   MCH 22.3 (L) 26.0 - 34.0 pg   MCHC 29.9 (L) 30.0 - 36.0 g/dL   RDW 43.3 (H) 29.5 - 18.8 %   Platelets 213 150 - 400 K/uL   nRBC 0.4 (H) 0.0 - 0.2 %   Neutrophils Relative % 54 %   Neutro Abs 6.2 1.7 - 7.7 K/uL   Lymphocytes Relative 32 %   Lymphs Abs 3.6 0.7 - 4.0 K/uL   Monocytes Relative 5 %   Monocytes Absolute 0.5 0.1 - 1.0 K/uL   Eosinophils Relative 2 %   Eosinophils Absolute 0.2 0.0 - 0.5 K/uL   Basophils Relative 0 %   Basophils Absolute 0.1 0.0 - 0.1 K/uL   Immature Granulocytes 7 %   Abs Immature Granulocytes 0.82 (H) 0.00 - 0.07 K/uL    Comment: Performed at [facility], [address] [place], [place] [address]  Magnesium     Status: Abnormal   Collection Time: 03/13/21  4:15 AM  Result Value Ref Range   Magnesium 1.5 (L) 1.7 - 2.4 mg/dL    Comment: Performed at [facility], [address] [place], [place] [address]    No results found.  Current scheduled medications . cephALEXin  500 mg Oral Q8H  . docusate sodium  100 mg Oral Daily  . feeding supplement  237 mL Oral BID BM  . methadone  50 mg Oral Daily  . nicotine  14 mg Transdermal Daily  . pantoprazole (PROTONIX) IV  40 mg Intravenous Q24H  . prenatal multivitamin  1 tablet Oral Q1200    I have reviewed the patient's current medications.  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 Crissie Reese, please refer to the note on 03/11/21 for further details. Methadone dose increased to 50 mg daily yesterday. As per Dr. Audie Clear note, this dose needs to be maintained until 4/18 - If patient requires increase on 4/18 can go up by 5-10 mg that day - TOC team has coordinated clinical intake at [facility] on 4/19, needs to receive her daily dose of methadone and be discharged at ~0600 so that she can get there before 0700 for her intake - Continue comfort meds PRN for withdrawal (Vistaril etc)  #PreE w/o SF Multiple mild range BP's since admission, CBC/CMP unremarkable, UPCR elevated to 0.41, will need to start weekly antenatal testing. BPP 8/8 on admission and on 03/10/21. Discussed with patient, she is aware of need for increased testing and IOL at 37 weeks.   #Weight loss #N/v #Esophagitis 14 lb weight loss, multifactorial in setting of possible hyperemesis and uncontrolled OUD. Significantly improved. - Continue to advance diet as tolerated - IV PPI daily   #Hypokalemia Improved to 3.4 today, will recheck labs and replete accordingly   #Supervision of high risk pregnancy Has not been engaging in regular prenatal care in clinic. - third trimester labs unremarkable with exception of 2hr GTT - A1c normal, will do formal 2hr GTT or 1 hr GTT prior to discharge - TDaP ordered - 03/10/21 ultrasound showed EFW 1566g/32%, AFI 8.6 cm, cephalic, BPP 8/8  Continue routine antenatal care.  Dispo: inpatient, plan for discharge at 0600 on 03/16/2021 to [facility] for methadone clinic intake   Jaynie Collins, MD, FACOG Obstetrician & Gynecologist, [facility] for [employer], [facility]

## 2021-03-13 NOTE — Progress Notes (Signed)
Patient returned to room after leaving hospital property.  Patient extremely drowsy and speech somewhat slurred.

## 2021-03-13 NOTE — Progress Notes (Signed)
Patient continuing to leave hospital property with IV access.  Dr. Charlotta Newton aware.  Patient educated not to leave hospital property while being treated.

## 2021-03-14 DIAGNOSIS — O212 Late vomiting of pregnancy: Secondary | ICD-10-CM

## 2021-03-14 DIAGNOSIS — F1310 Sedative, hypnotic or anxiolytic abuse, uncomplicated: Secondary | ICD-10-CM

## 2021-03-14 LAB — COMPREHENSIVE METABOLIC PANEL
ALT: 15 U/L (ref 0–44)
AST: 17 U/L (ref 15–41)
Albumin: 1.9 g/dL — ABNORMAL LOW (ref 3.5–5.0)
Alkaline Phosphatase: 132 U/L — ABNORMAL HIGH (ref 38–126)
Anion gap: 7 (ref 5–15)
BUN: <5 mg/dL — ABNORMAL LOW (ref 6–20)
CO2: 25 mmol/L (ref 22–32)
Calcium: 8.3 mg/dL — ABNORMAL LOW (ref 8.9–10.3)
Chloride: 104 mmol/L (ref 98–111)
Creatinine, Ser: 0.57 mg/dL (ref 0.44–1.00)
GFR, Estimated: >60 mL/min (ref >60)
Glucose, Bld: 93 mg/dL (ref 70–99)
Potassium: 3.5 mmol/L (ref 3.5–5.1)
Sodium: 136 mmol/L (ref 135–145)
Total Bilirubin: 0.4 mg/dL (ref 0.3–1.2)
Total Protein: 5.4 g/dL — ABNORMAL LOW (ref 6.5–8.1)

## 2021-03-14 LAB — GLUCOSE, FASTING: Glucose, Fasting: 92 mg/dL (ref 70–99)

## 2021-03-14 LAB — GLUCOSE, 2 HOUR: Glucose, 2 hour: 80 mg/dL (ref 70–139)

## 2021-03-14 MED ORDER — METHADONE HCL 10 MG/ML PO CONC
10.0000 mg | Freq: Once | ORAL | Status: AC
Start: 2021-03-14 — End: 2021-03-14
  Administered 2021-03-14: 10 mg via ORAL
  Filled 2021-03-14: qty 1

## 2021-03-14 MED ORDER — PANTOPRAZOLE SODIUM 20 MG PO TBEC
20.0000 mg | DELAYED_RELEASE_TABLET | Freq: Two times a day (BID) | ORAL | Status: DC
  Administered 2021-03-14 – 2021-03-15 (×4): 20 mg via ORAL
  Filled 2021-03-14 (×4): qty 1

## 2021-03-14 MED ORDER — METHADONE HCL 10 MG/ML PO CONC
60.0000 mg | Freq: Every day | ORAL | Status: DC
  Administered 2021-03-15: 60 mg via ORAL
  Filled 2021-03-14: qty 6

## 2021-03-14 NOTE — Progress Notes (Addendum)
Patient stated that she vomited immediately after drinking her Glucola. MD Notified

## 2021-03-14 NOTE — Progress Notes (Signed)
Patient left unit with IV access. Patient educated on risks of leaving unit. Dr. Macon Large notified. Orders received and IV removed. Patient refused fetal monitoring at this time.

## 2021-03-14 NOTE — Progress Notes (Signed)
FACULTY PRACTICE ANTEPARTUM(COMPREHENSIVE) NOTE   Meghan Bullock is a 30 y.o. [phone] with Estimated Date of Delivery: 05/15/21   By  best clinical estimate [redacted]w[redacted]d  who is admitted for weight loss/hyperemesis and opioid use disorder.    Fetal presentation is cephalic. Length of Stay:  4  Days  Date of admission:03/10/2021  Subjective: Reports no acute issues overnight.  Denies nausea/vomiting.  Tolerating gen diet.  No headache, no blurry vision, no RUQ pain. Patient reports the fetal movement as active. Patient reports uterine contraction  activity as none. Patient reports  vaginal bleeding as none. Patient describes fluid per vagina as None.  Vitals:  Blood pressure 135/84, pulse 77, temperature 97.6 F (36.4 C), temperature source Oral, resp. rate 16, height  (1.626 m), weight 67.1 kg, last menstrual period 08/08/2020, SpO2 99 %. Vitals:   03/13/21 1656 03/13/21 1949 03/14/21 0013 03/14/21 0430  BP: 127/79 (!) 156/90  135/84  Pulse: 78 72 71 77  Resp: 16 16    Temp: 97.8 F (36.6 C) 98.1 F (36.7 C)  97.6 F (36.4 C)  TempSrc: Oral Oral  Oral  SpO2: 99% 99%  99%  Weight:      Height:       Physical Examination:  General appearance - alert, well appearing, and in no distress Mental status - normal mood and behavior Chest - clear to auscultation, no wheezes, rales or rhonchi, symmetric air entry Heart - normal rate and regular rhythm Abdomen - gravid, soft and non-tender Extremities - no pedal edema noted, no calf tenderness bilaterally Skin - warm and dry   Fetal Monitoring:  Baseline: 135 bpm, Variability: moderate, Accelerations: +accels, and Decelerations: Absent    reactive  Labs:  No results found for this or any previous visit (from the past 24 hour(s)).  Imaging Studies:    [place] MFM FETAL BPP WO NON STRESS  Result Date: 03/11/2021 ----------------------------------------------------------------------  OBSTETRICS REPORT                       (Signed Final  03/11/2021 10:53 am) ---------------------------------------------------------------------- Patient Info  ID #:       [id number]                          D.O.B.:  Apr 11, 1991 (29 yrs)  Name:       Meghan Bullock             Visit Date: 03/10/2021 05:22 pm ---------------------------------------------------------------------- Performed By  Attending:        Lin Landsman      Ref. Address:     [facility]                                                             [facility]                                                             [facility]  Performed By:     Percell Boston          [facility]  Referred By:      Warden Fillers MD ---------------------------------------------------------------------- Orders  #  Description                           Code        Ordered By  1  [place] MFM OB FOLLOW UP                   76816.01    MATTHEW ECKSTAT  2  [place] MFM FETAL BPP WO NON               76819.01    MATTHEW ECKSTAT     STRESS ----------------------------------------------------------------------  #  Order #                     Accession #                Episode #  1  [id number]                   [id number]                 [id number]  2  [id number]                   [id number]                 [id number] ---------------------------------------------------------------------- Indications  Substance abuse affecting pregnancy,           O99.320 F19.10  antepartum(Heroin/Benzo)  [redacted] weeks gestation of pregnancy                Z3A.30  Chronic Hepatitis C complicating pregnancy,    O98.419 B18.2  antepartum  Suboxone use                                   O99.320  Encounter for other antenatal screening        Z36.2  follow-up ---------------------------------------------------------------------- Fetal Evaluation  Num Of Fetuses:         1  Fetal Heart Rate(bpm):  145  Cardiac Activity:        Observed  Presentation:           Cephalic  Placenta:               Anterior  P. Cord Insertion:      Previously Visualized  Amniotic Fluid  AFI FV:      Subjectively decreased  AFI Sum(cm)     %Tile       Largest Pocket(cm)  8.6             4.3         2.5  RUQ(cm)       RLQ(cm)  LUQ(cm)        LLQ(cm)  1.9           2.4           1.8            2.5 ---------------------------------------------------------------------- Biophysical Evaluation  Amniotic F.V:   Pocket => 2 cm             F. Tone:        Observed  F. Movement:    Observed                   Score:          8/8  F. Breathing:   Observed ---------------------------------------------------------------------- Biometry  BPD:      76.3  mm     G. Age:  30w 4d         39  %    CI:        72.82   %    70 - 86                                                          FL/HC:      19.9   %    19.3 - 21.3  HC:      284.3  mm     G. Age:  31w 1d         31  %    HC/AC:      1.07        0.96 - 1.17  AC:      265.2  mm     G. Age:  30w 4d         48  %    FL/BPD:     74.2   %    71 - 87  FL:       56.6  mm     G. Age:  29w 5d         15  %    FL/AC:      21.3   %    20 - 24  Est. FW:    1566  gm      3 lb 7 oz     32  % ---------------------------------------------------------------------- OB History  Gravidity:    4         Term:   1        Prem:   1        SAB:   1  Living:       2 ---------------------------------------------------------------------- Gestational Age  LMP:           30w 4d        Date:  08/08/20                 EDD:   05/15/21  Clinical EDD:  30w 4d                                        EDD:   05/15/21  U/S Today:     30w 4d  EDD:   05/15/21  Best:          30w 4d     Det. By:  LMP  (08/08/20)          EDD:   05/15/21 ---------------------------------------------------------------------- Anatomy  Cranium:               Appears normal         Aortic Arch:            Appears normal  Cavum:                  Appears normal         Ductal Arch:            Previously seen  Ventricles:            Appears normal         Diaphragm:              Appears normal  Choroid Plexus:        Previously seen        Stomach:                Appears normal, left                                                                        sided  Cerebellum:            Appears normal         Abdomen:                Appears normal  Posterior Fossa:       Appears normal         Abdominal Wall:         Previously seen  Nuchal Fold:           Previously seen        Cord Vessels:           Previously seen  Face:                  Orbits nl; profile not Kidneys:                Previously seen                         well visualized  Lips:                  Appears normal         Bladder:                Appears normal  Thoracic:              Appears normal         Spine:                  Ltd views no  intracranial signs of                                                                        NTD  Heart:                 Not well visualized    Upper Extremities:      Appears normal  RVOT:                  Not well visualized    Lower Extremities:      Appears normal  LVOT:                  Appears normal  Other:  Appears to be female. Technically difficult due to maternal habitus and          fetal position. ---------------------------------------------------------------------- Cervix Uterus Adnexa  Cervix  Not visualized (advanced GA >24wks)  Uterus  No abnormality visualized.  Right Ovary  No adnexal mass visualized.  Left Ovary  No adnexal mass visualized.  Cul De Sac  No free fluid seen.  Adnexa  No abnormality visualized. ---------------------------------------------------------------------- Impression  Follow up growth due to substance abuse with recent opioid  relapse.  Normal interval growth with measurements consistent with  dates  Good fetal movement and objectively normal  amniotic fluid  volume, but subjectively decreased amniotic fluid.  Biophysical profile 8/8 ---------------------------------------------------------------------- Recommendations  Follow up growth in 4 weeks. ----------------------------------------------------------------------               Lin Landsman, MD Electronically Signed Final Report   03/11/2021 10:53 am ----------------------------------------------------------------------  [place] MFM OB FOLLOW UP  Result Date: 03/11/2021 ----------------------------------------------------------------------  OBSTETRICS REPORT                       (Signed Final 03/11/2021 10:53 am) ---------------------------------------------------------------------- Patient Info  ID #:       [id number]                          D.O.B.:  September 09, 1991 (29 yrs)  Name:       MILIA WARTH             Visit Date: 03/10/2021 05:22 pm ---------------------------------------------------------------------- Performed By  Attending:        Lin Landsman      Ref[facility]  Performed By:     Percell Boston          [facility]  Referred By:      Warden Fillers MD ---------------------------------------------------------------------- Orders  #  Description                           Code        Ordered By  1  [place] MFM OB FOLLOW UP                   76816.01    MATTHEW ECKSTAT  2  [place] MFM FETAL BPP WO NON               76819.01    MATTHEW ECKSTAT     STRESS ----------------------------------------------------------------------  #  Order #                     Accession #                Episode #  1  [id number]                   [id number]                 [id number]  2  [id number]                   [id number]                  [id number] ---------------------------------------------------------------------- Indications  Substance abuse affecting pregnancy,           O99.320 F19.10  antepartum(Heroin/Benzo)  [redacted] weeks gestation of pregnancy                Z3A.30  Chronic Hepatitis C complicating pregnancy,    O98.419 B18.2  antepartum  Suboxone use                                   O99.320  Encounter for other antenatal screening        Z36.2  follow-up ---------------------------------------------------------------------- Fetal Evaluation  Num Of Fetuses:         1  Fetal Heart Rate(bpm):  145  Cardiac Activity:       Observed  Presentation:           Cephalic  Placenta:               Anterior  P. Cord Insertion:      Previously Visualized  Amniotic Fluid  AFI FV:      Subjectively decreased  AFI Sum(cm)     %Tile       Largest Pocket(cm)  8.6             4.3         2.5  RUQ(cm)       RLQ(cm)  LUQ(cm)        LLQ(cm)  1.9           2.4           1.8            2.5 ---------------------------------------------------------------------- Biophysical Evaluation  Amniotic F.V:   Pocket => 2 cm             F. Tone:        Observed  F. Movement:    Observed                   Score:          8/8  F. Breathing:   Observed ---------------------------------------------------------------------- Biometry  BPD:      76.3  mm     G. Age:  30w 4d         39  %    CI:        72.82   %    70 - 86                                                          FL/HC:      19.9   %    19.3 - 21.3  HC:      284.3  mm     G. Age:  31w 1d         31  %    HC/AC:      1.07        0.96 - 1.17  AC:      265.2  mm     G. Age:  30w 4d         48  %    FL/BPD:     74.2   %    71 - 87  FL:       56.6  mm     G. Age:  29w 5d         15  %    FL/AC:      21.3   %    20 - 24  Est. FW:    1566  gm      3 lb 7 oz     32  % ---------------------------------------------------------------------- OB History  Gravidity:    4         Term:   1        Prem:   1        SAB:    1  Living:       2 ---------------------------------------------------------------------- Gestational Age  LMP:           30w 4d        Date:  08/08/20                 EDD:   05/15/21  Clinical EDD:  30w 4d                                        EDD:   05/15/21  U/S Today:     30w 4d  EDD:   05/15/21  Best:          30w 4d     Det. By:  LMP  (08/08/20)          EDD:   05/15/21 ---------------------------------------------------------------------- Anatomy  Cranium:               Appears normal         Aortic Arch:            Appears normal  Cavum:                 Appears normal         Ductal Arch:            Previously seen  Ventricles:            Appears normal         Diaphragm:              Appears normal  Choroid Plexus:        Previously seen        Stomach:                Appears normal, left                                                                        sided  Cerebellum:            Appears normal         Abdomen:                Appears normal  Posterior Fossa:       Appears normal         Abdominal Wall:         Previously seen  Nuchal Fold:           Previously seen        Cord Vessels:           Previously seen  Face:                  Orbits nl; profile not Kidneys:                Previously seen                         well visualized  Lips:                  Appears normal         Bladder:                Appears normal  Thoracic:              Appears normal         Spine:                  Ltd views no  intracranial signs of                                                                        NTD  Heart:                 Not well visualized    Upper Extremities:      Appears normal  RVOT:                  Not well visualized    Lower Extremities:      Appears normal  LVOT:                  Appears normal  Other:  Appears to be female. Technically difficult due to maternal habitus and           fetal position. ---------------------------------------------------------------------- Cervix Uterus Adnexa  Cervix  Not visualized (advanced GA >24wks)  Uterus  No abnormality visualized.  Right Ovary  No adnexal mass visualized.  Left Ovary  No adnexal mass visualized.  Cul De Sac  No free fluid seen.  Adnexa  No abnormality visualized. ---------------------------------------------------------------------- Impression  Follow up growth due to substance abuse with recent opioid  relapse.  Normal interval growth with measurements consistent with  dates  Good fetal movement and objectively normal amniotic fluid  volume, but subjectively decreased amniotic fluid.  Biophysical profile 8/8 ---------------------------------------------------------------------- Recommendations  Follow up growth in 4 weeks. ----------------------------------------------------------------------               Lin Landsman, MD Electronically Signed Final Report   03/11/2021 10:53 am ----------------------------------------------------------------------    Medications:  Scheduled . cephALEXin  500 mg Oral Q8H  . docusate sodium  100 mg Oral Daily  . feeding supplement  237 mL Oral BID BM  . methadone  50 mg Oral Daily  . nicotine  14 mg Transdermal Daily  . pantoprazole (PROTONIX) IV  40 mg Intravenous Q24H  . prenatal multivitamin  1 tablet Oral Q1200   I have reviewed the patient's current medications.  ASSESSMENT: [id number] [redacted]w[redacted]d Estimated Date of Delivery: 05/15/21  Principal Problem:   Methadone maintenance treatment affecting pregnancy in third trimester [facility]) Active Problems:   Heroin abuse (HCC)   Benzodiazepine abuse (HCC)   Hepatitis C   Substance abuse affecting pregnancy, antepartum   Pre-eclampsia, mild, antepartum, third trimester   Malnutrition of moderate degree   Opioid use disorder, severe, dependence (HCC)   [redacted] weeks gestation of pregnancy   PLAN: #Severe opioid use disorder #Polysubstance  use disorder - Managed by Dr. Crissie Reese, please refer to the note on 03/11/21 for further details -continued on Methadone 50mg  daily- dose to be maintained until 4/18 - If patient requires increase on 4/18 can go up by 5-10 mg that day -doing well with vistaril twice daily - TOC team has coordinated clinical intake at [facility] on 4/19, needs to receive her daily dose of methadone and be discharged at ~0600 so that she can get there before 0700 for her intake - Continue comfort meds PRN for withdrawal (Vistaril etc)  #PreE w/o SF -BP remains stable, pt asymptomatic -continue weekly antepartum testing  #Weight loss/Hyperemesis -tolerating regular diet -protonix daily- will transition to oral  #  Hypokalemia Repleated on 4/16, CMP pending this am  #Supervision of high risk pregnancy Has not been engaging in regular prenatal care in clinic. - 2hr glucola to be completed today, A1c normal - TDaP ordered - 03/10/21 ultrasound showed EFW 1566g/32%, AFI 8.6 cm, cephalic, BPP 8/8  Continue routine antenatal care  Dispo: inpatient, plan for discharge at 0600 on 03/16/2021 to [facility] for methadone clinic intake  Myna Hidalgo, DO Attending Obstetrician & Gynecologist, [facility], [facility]

## 2021-03-15 ENCOUNTER — Inpatient Hospital Stay (HOSPITAL_COMMUNITY): Payer: Medicaid Other

## 2021-03-15 ENCOUNTER — Other Ambulatory Visit (HOSPITAL_COMMUNITY): Payer: Self-pay

## 2021-03-15 ENCOUNTER — Inpatient Hospital Stay (HOSPITAL_BASED_OUTPATIENT_CLINIC_OR_DEPARTMENT_OTHER): Payer: Medicaid Other

## 2021-03-15 DIAGNOSIS — O98413 Viral hepatitis complicating pregnancy, third trimester: Secondary | ICD-10-CM | POA: Diagnosis not present

## 2021-03-15 DIAGNOSIS — F1910 Other psychoactive substance abuse, uncomplicated: Secondary | ICD-10-CM

## 2021-03-15 DIAGNOSIS — O99323 Drug use complicating pregnancy, third trimester: Secondary | ICD-10-CM | POA: Diagnosis not present

## 2021-03-15 DIAGNOSIS — B182 Chronic viral hepatitis C: Secondary | ICD-10-CM

## 2021-03-15 DIAGNOSIS — O1493 Unspecified pre-eclampsia, third trimester: Secondary | ICD-10-CM

## 2021-03-15 DIAGNOSIS — F1120 Opioid dependence, uncomplicated: Secondary | ICD-10-CM

## 2021-03-15 LAB — BASIC METABOLIC PANEL
Anion gap: 8 (ref 5–15)
BUN: 5 mg/dL — ABNORMAL LOW (ref 6–20)
CO2: 23 mmol/L (ref 22–32)
Calcium: 9 mg/dL (ref 8.9–10.3)
Chloride: 102 mmol/L (ref 98–111)
Creatinine, Ser: 0.51 mg/dL (ref 0.44–1.00)
GFR, Estimated: >60 mL/min (ref >60)
Glucose, Bld: 97 mg/dL (ref 70–99)
Potassium: 4.9 mmol/L (ref 3.5–5.1)
Sodium: 133 mmol/L — ABNORMAL LOW (ref 135–145)

## 2021-03-15 LAB — RAPID URINE DRUG SCREEN, HOSP PERFORMED
Amphetamines: NOT DETECTED
Barbiturates: NOT DETECTED
Benzodiazepines: POSITIVE — AB
Cocaine: NOT DETECTED
Opiates: NOT DETECTED
Tetrahydrocannabinol: POSITIVE — AB

## 2021-03-15 MED ORDER — BACITRACIN ZINC 500 UNIT/GM EX OINT: TOPICAL_OINTMENT | Freq: Two times a day (BID) | CUTANEOUS | Status: DC

## 2021-03-15 MED ORDER — ONDANSETRON HCL 4 MG PO TABS
4.0000 mg | ORAL_TABLET | Freq: Four times a day (QID) | ORAL | Status: DC | PRN
  Administered 2021-03-15 (×2): 4 mg via ORAL
  Filled 2021-03-15 (×2): qty 1

## 2021-03-15 MED ORDER — ESCITALOPRAM OXALATE 5 MG PO TABS
5.0000 mg | ORAL_TABLET | Freq: Every day | ORAL | 6 refills | Status: DC
  Filled 2021-03-15: qty 30, 30d supply, fill #0

## 2021-03-15 MED ORDER — CEPHALEXIN 500 MG PO CAPS
500.0000 mg | ORAL_CAPSULE | Freq: Three times a day (TID) | ORAL | 0 refills | Status: AC
  Filled 2021-03-15: qty 21, 7d supply, fill #0

## 2021-03-15 MED ORDER — HYDROXYZINE HCL 25 MG PO TABS
50.0000 mg | ORAL_TABLET | Freq: Three times a day (TID) | ORAL | 3 refills | Status: AC | PRN
  Filled 2021-03-15: qty 60, 10d supply, fill #0

## 2021-03-15 MED ORDER — BACITRACIN ZINC 500 UNIT/GM EX OINT
TOPICAL_OINTMENT | Freq: Two times a day (BID) | CUTANEOUS | Status: DC
  Filled 2021-03-15: qty 28.4

## 2021-03-15 MED ORDER — ESCITALOPRAM OXALATE 10 MG PO TABS
5.0000 mg | ORAL_TABLET | Freq: Every day | ORAL | Status: DC
  Administered 2021-03-15: 5 mg via ORAL
  Filled 2021-03-15: qty 1

## 2021-03-15 MED ORDER — PANTOPRAZOLE SODIUM 20 MG PO TBEC
20.0000 mg | DELAYED_RELEASE_TABLET | Freq: Two times a day (BID) | ORAL | 3 refills | Status: DC
  Filled 2021-03-15: qty 60, 30d supply, fill #0

## 2021-03-15 MED ORDER — ONDANSETRON HCL 4 MG PO TABS
4.0000 mg | ORAL_TABLET | Freq: Three times a day (TID) | ORAL | 0 refills | Status: AC | PRN
  Filled 2021-03-15: qty 20, 7d supply, fill #0

## 2021-03-15 NOTE — Progress Notes (Signed)
Pt ask for meal then left unit and returned 7 minutes later to eat meal

## 2021-03-15 NOTE — Discharge Summary (Signed)
Physician Discharge Summary  Patient ID: Meghan Bullock MRN: [id number] DOB/AGE: 28/28/1992 28 y.o.  Admit date: 03/10/2021 Discharge date: 03/16/2021  Admission Diagnoses: Weight loss Nausea and vomiting in pregnancy Preeclampsia, mild Opioid Use Disorder Intrauterine pregnancy @ 31wks Hypokalemia  Discharge Diagnoses:  Principal Problem:   Methadone maintenance treatment affecting pregnancy in third trimester [facility]) Active Problems:   Heroin abuse ([place])   Benzodiazepine abuse ([place])   Hepatitis C   Substance abuse affecting pregnancy, antepartum   Pre-eclampsia, mild, antepartum, third trimester   Malnutrition of moderate degree   Opioid use disorder, severe, dependence ([place])   [redacted] weeks gestation of pregnancy   Discharged Condition: stable  Hospital Course: 30yo [id number]@ 38w4dadmitted due to nausea/vomiting, weight loss and opioid use disorder. Additionally, BP elevated and PC ratio 0.4- consistent with preeclampsia.  Treated with IV anti-emetics and transitioned to methadone for opioid use.   Symptomatically, she improved and was able to tolerate oral mediation and po diet.  Also noted to have cellulitis on her hand- treated with Keflex.  Plan with assistance of social work was made for outpatient methadone treatment.  Pt to have weekly antepartum testing and close outpatient follow up.   Consults: None  Significant Diagnostic Studies:  Recent Results (from the past 2160 hour(s))  Obstetric Panel, Including HIV     Status: Abnormal   Collection Time: 12/24/20  4:32 PM  Result Value Ref Range   Hepatitis B Surface Ag Negative Negative   RPR Ser Ql Non Reactive Non Reactive   Rubella Antibodies, IGG 5.33 Immune >0.99 index    Comment:                                 Non-immune       <0.90                                 Equivocal  0.90 - 0.99                                 Immune           >0.99    ABO Grouping O    Rh Factor Positive     Comment: Please note:  Prior records for this patient's ABO / Rh type are not available for additional verification.    Antibody Screen Negative Negative   HIV Screen 4th Generation wRfx Non Reactive Non Reactive    Comment: HIV Negative HIV-1/HIV-2 antibodies and HIV-1 p24 antigen were NOT detected. There is no laboratory evidence of HIV infection.    WBC 12.2 (H) 3.4 - 10.8 x10E3/uL   RBC 4.52 3.77 - 5.28 x10E6/uL   Hemoglobin 10.6 (L) 11.1 - 15.9 g/dL   Hematocrit 33.9 (L) 34.0 - 46.6 %   MCV 75 (L) 79 - 97 fL   MCH 23.5 (L) 26.6 - 33.0 pg   MCHC 31.3 (L) 31.5 - 35.7 g/dL   RDW 17.4 (H) 11.7 - 15.4 %   Platelets 363 150 - 450 x10E3/uL   Neutrophils 72 Not Estab. %   Lymphs 19 Not Estab. %   Monocytes 7 Not Estab. %   Eos 1 Not Estab. %   Basos 0 Not Estab. %   Neutrophils Absolute 8.8 (H) 1.4 - 7.0 x10E3/uL   Lymphocytes  Absolute 2.3 0.7 - 3.1 x10E3/uL   Monocytes Absolute 0.8 0.1 - 0.9 x10E3/uL   EOS (ABSOLUTE) 0.1 0.0 - 0.4 x10E3/uL   Basophils Absolute 0.1 0.0 - 0.2 x10E3/uL   Immature Granulocytes 1 Not Estab. %   Immature Grans (Abs) 0.1 0.0 - 0.1 x10E3/uL  Hemoglobin A1c     Status: None   Collection Time: 12/24/20  4:32 PM  Result Value Ref Range   Hgb A1c MFr Bld 5.3 4.8 - 5.6 %    Comment:          Prediabetes: 5.7 - 6.4          Diabetes: >6.4          Glycemic control for adults with diabetes: <7.0    Est. average glucose Bld gHb Est-mCnc 105 mg/dL  HCV RNA quant     Status: None   Collection Time: 12/24/20  4:36 PM  Result Value Ref Range   Hepatitis C Quantitation HCV Not Detected IU/mL   Test Information Comment     Comment: The quantitative range of this assay is 15 IU/mL to 100 million IU/mL.  Urine drugs of abuse scrn w alc, routine ([facility])     Status: None   Collection Time: 12/24/20  4:40 PM  Result Value Ref Range   Amphetamines, Urine Negative Cutoff=1000 ng/mL    Comment: Amphetamine test includes Amphetamine and Methamphetamine.    Barbiturate Quant, Ur Negative Cutoff=300 ng/mL   Benzodiazepine Quant, Ur See Final Results Cutoff=300 ng/mL   Cannabinoid Quant, Ur See Final Results Cutoff=50 ng/mL   Cocaine (Metab.) Negative Cutoff=300 ng/mL   Opiate Quant, Ur Negative Cutoff=300 ng/mL    Comment: Opiate test includes Codeine and Morphine only.   PCP Quant, Ur Negative Cutoff=25 ng/mL   Methadone Screen, Urine Negative Cutoff=300 ng/mL   Propoxyphene Negative Cutoff=300 ng/mL   Ethanol, Urine Negative Cutoff=0.020 %  Drug Profile [id number]     Status: Abnormal   Collection Time: 12/24/20  4:40 PM  Result Value Ref Range   BENZODIAZEPINES Positive (A) Cutoff=300   NORDIAZEPAM Negative Cutoff=300   Oxazepam Positive (A)    Oxazepam GC/MS Conf 594 Cutoff=300 ng/mL   HYDROXYALPRAZOLAM Negative Cutoff=300  Panel [id number]     Status: Abnormal   Collection Time: 12/24/20  4:40 PM  Result Value Ref Range   Cannabinoid GC/MS, Ur Positive (A) Cutoff=50   CARBOXY THC GC/MS CONF 522 Cutoff=10 ng/mL  Culture, OB Urine     Status: None   Collection Time: 12/24/20  4:44 PM   Specimen: Urine   UR  Result Value Ref Range   Urine Culture, OB Final report   Urine Culture, OB Reflex     Status: None   Collection Time: 12/24/20  4:44 PM  Result Value Ref Range   Organism ID, Bacteria Comment     Comment: Mixed urogenital flora 10,000-25,000 colony forming units per mL   Cytology - PAP( [place])     Status: Abnormal   Collection Time: 12/24/20  4:57 PM  Result Value Ref Range   Chlamydia Negative    Neisseria Gonorrhea Negative    High risk HPV Positive (A)    Adequacy      Satisfactory for evaluation; transformation zone component PRESENT.   Diagnosis (A)     - Atypical squamous cells of undetermined significance (ASC-US)   Comment Normal Reference Ranger Chlamydia - Negative    Comment Normal Reference Range HPV - Negative  Comment      Normal Reference Range Neisseria Gonorrhea - Negative  BUPRENORPHINE + Drug  Screen, Ur     Status: Abnormal   Collection Time: 01/05/21  4:30 PM  Result Value Ref Range   Buprenorphine Negative Cutoff=5 ng/mL   Ethyl Glucuronide Negative Cutoff=500 ng/mL   Amphetamines Negative Cutoff=500 ng/mL   Barbiturates Negative Cutoff=200 ng/mL   Benzodiazepines ++POSITIVE++ (A) Cutoff=200 ng/mL   Cocaine Metabolite Negative Cutoff=150 ng/mL   Phencyclidine (PCP) Negative Cutoff=25 ng/mL   Marijuana MTB (THC) ++POSITIVE++ (A) Cutoff=20 ng/mL   6-Acetylmorphine Negative Cutoff=10 ng/mL   Opiates Negative Cutoff=300 ng/mL   Oxycodone Negative Cutoff=100 ng/mL   Tapentadol Negative Cutoff=200 ng/mL   Fentanyl Negative Cutoff=2.0 ng/mL   Methadone Negative Cutoff=300 ng/mL   Propoxyphene Negative Cutoff=300 ng/mL   Tramadol Negative Cutoff=200 ng/mL   Carisoprodol Negative Cutoff=100 ng/mL   Gabapentin 370.0 (H) Cutoff=1.5 ug/mL    Comment: Note:  Gabapentin analysis is performed by immunoassay; positive findings are not confirmed by a second method. If results do not support the expected clinical finding, confirmation by an alternate methodology is recommended.    Creatinine 129.0 >=20 mg/dL   Urine pH 6.2 4.5 - 8.9   Nitrites Negative <200 ug/mL    Comment:  Some components of this panel were developed and performance characteristics determined by [facility]. They have not been cleared or approved by the Food and Drug Administration: EtG, Carisoprodol, Fentanyl, Tapentadol and Gabapentin.   Expanded Benzodiazepine Conf     Status: None   Collection Time: 01/05/21  4:30 PM  Result Value Ref Range   Desmethyldiazepam 210 Cutoff=100 ng/mL   Oxazepam 308 Cutoff=100 ng/mL   Temazepam 675 Cutoff=100 ng/mL   Alpha-Hydroxyalprazolam Negative Cutoff=100 ng/mL   Hydroxymethylflurazepam Negative Cutoff=100 ng/mL   Lorazepam Negative Cutoff=100 ng/mL   Alpha-Hydrozytriazolam Negative Cutoff=100 ng/mL   Alpha-Hydrozymidazolam Negative Cutoff=100 ng/mL   7-Aminoclonazepam  Negative Cutoff=100 ng/mL  Carboxy-Thc Normalized Ratio     Status: None   Collection Time: 01/05/21  4:30 PM  Result Value Ref Range   Carboxy-THC 432 Cutoff=10 ng/mL   THC/CR Ratio 335 ng/mg creat  Pain Mgt Scrn (14 Drugs), Ur     Status: Abnormal   Collection Time: 03/10/21  2:50 PM  Result Value Ref Range   Amphetamine Scrn, Ur Negative Cutoff=1000 ng/mL   BARBITURATE SCREEN URINE Negative Cutoff=200 ng/mL   BENZODIAZEPINE SCREEN, URINE Positive (A) Cutoff=200 ng/mL   CANNABINOIDS UR QL SCN Positive (A) Cutoff=20 ng/mL   Cocaine (Metab) Scrn, Ur Negative Cutoff=300 ng/mL   Opiate Scrn, Ur Negative Cutoff=300 ng/mL    Comment: Opiate test includes Codeine, Morphine, Hydromorphone, Hydrocodone.   OXYCODONE+OXYMORPHONE UR QL SCN Negative Cutoff=100 ng/mL    Comment: Test includes Oxycodone and Oxymorphone   Phencyclidine Qn, Ur Negative Cutoff=25 ng/mL   Methadone Screen, Urine Negative Cutoff=300 ng/mL   Propoxyphene Scrn, Ur Negative Cutoff=300 ng/mL   Meperidine Screen, Urine Negative Cutoff=200 ng/mL    Comment: This test was developed and its performance characteristics determined by [facility]. It has not been cleared or approved by the Food and Drug Administration.    Fentanyl, Urine Positive (A) Cutoff=2000 pg/mL    Comment: Test includes Fentanyl and Norfentanyl This test was developed and its performance characteristics determined by [facility]. It has not been cleared or approved by the Food and Drug Administration.    Tramadol Screen, Urine Negative Cutoff=200 ng/mL   Buprenorphine, Urine Negative Cutoff=10 ng/mL   Creatinine(Crt), U 58.9 20.0 - 300.0 mg/dL  Ph of Urine 7.2 4.5 - 8.9   PLEASE NOTE: Comment     Comment: This assay provides a preliminary unconfirmed analytical test result that may be suitable for clinical management of patients in certain situations. Drug-test results should be interpreted in the context of clinical information. Patient  metabolic variables, specific drug chemistry, and specimen characteristics can affect test outcome. Technical consultation is available if a test result is inconsistent with an expected outcome. ([email or web address] or call toll-free [phone])   SARS CORONAVIRUS 2 (TAT 6-24 HRS) Nasopharyngeal Nasopharyngeal Swab     Status: None   Collection Time: 03/10/21  5:09 PM   Specimen: Nasopharyngeal Swab  Result Value Ref Range   SARS Coronavirus 2 NEGATIVE NEGATIVE    Comment: (NOTE) SARS-CoV-2 target nucleic acids are NOT DETECTED.  The SARS-CoV-2 RNA is generally detectable in upper and lower respiratory specimens during the acute phase of infection. Negative results do not preclude SARS-CoV-2 infection, do not rule out co-infections with other pathogens, and should not be used as the sole basis for treatment or other patient management decisions. Negative results must be combined with clinical observations, patient history, and epidemiological information. The expected result is Negative.  Fact Sheet for Patients: SugarRoll.be  Fact Sheet for Healthcare Providers: https://www.woods-mathews.com/  This test is not yet approved or cleared by the [place] FDA and  has been authorized for detection and/or diagnosis of SARS-CoV-2 by FDA under an Emergency Use Authorization (EUA). This EUA will remain  in effect (meaning this test can be used) for the duration of the COVID-19 declaration under Se ction 564(b)(1) of the Act, 21 U.S.C. section 360bbb-3(b)(1), unless the authorization is terminated or revoked sooner.  Performed at [facility], [place] [address], [place], [place] [address]   Type and screen [place]     Status: None   Collection Time: 03/10/21  5:39 PM  Result Value Ref Range   ABO/RH(D) O POS    Antibody Screen NEG    Sample Expiration      03/13/2021,2359 Performed at [facility], [place] [address] [place], [place] [address]   CBC on admission     Status: Abnormal   Collection Time: 03/10/21  5:43 PM  Result Value Ref Range   WBC 13.4 (H) 4.0 - 10.5 K/uL   RBC 4.60 3.87 - 5.11 MIL/uL   Hemoglobin 10.2 (L) 12.0 - 15.0 g/dL   HCT 32.7 (L) 36.0 - 46.0 %   MCV 71.1 (L) 80.0 - 100.0 fL   MCH 22.2 (L) 26.0 - 34.0 pg   MCHC 31.2 30.0 - 36.0 g/dL   RDW 16.1 (H) 11.5 - 15.5 %   Platelets 162 150 - 400 K/uL    Comment: REPEATED TO VERIFY   nRBC 0.2 0.0 - 0.2 %    Comment: Performed at [facility], [place] [address], [place], [place] [address]  Comprehensive metabolic panel     Status: Abnormal   Collection Time: 03/10/21  5:43 PM  Result Value Ref Range   Sodium 130 (L) 135 - 145 mmol/L   Potassium 2.2 (LL) 3.5 - 5.1 mmol/L    Comment: CRITICAL RESULT CALLED TO, READ BACK BY AND VERIFIED WITHJerilynn Mages [facility] RN [phone] K FORSYTH    Chloride 85 (L) 98 - 111 mmol/L   CO2 35 (H) 22 - 32 mmol/L   Glucose, Bld 103 (H) 70 - 99 mg/dL    Comment: Glucose reference range applies  only to samples taken after fasting for at least 8 hours.   BUN 8 6 - 20 mg/dL   Creatinine, Ser 0.78 0.44 - 1.00 mg/dL   Calcium 8.2 (L) 8.9 - 10.3 mg/dL   Total Protein 6.3 (L) 6.5 - 8.1 g/dL   Albumin 2.1 (L) 3.5 - 5.0 g/dL   AST 20 15 - 41 U/L   ALT 13 0 - 44 U/L   Alkaline Phosphatase 124 38 - 126 U/L   Total Bilirubin 1.1 0.3 - 1.2 mg/dL   GFR, Estimated >60 >60 mL/min    Comment: (NOTE) Calculated using the CKD-EPI Creatinine Equation (2021)    Anion gap 10 5 - 15    Comment: Performed at [facility], [place] [address], [place], [place] [address]  RPR     Status: None   Collection Time: 03/10/21  5:43 PM  Result Value Ref Range   RPR Ser Ql NON REACTIVE NON REACTIVE    Comment: Performed at [facility], [place] [address], [place], [place] [address]  Rapid HIV screen (HIV 1/2 Ab+Ag)     Status: None   Collection Time: 03/10/21  5:43 PM  Result Value Ref Range    HIV-1 P24 Antigen - HIV24 NON REACTIVE NON REACTIVE    Comment: (NOTE) Detection of p24 may be inhibited by biotin in the sample, causing false negative results in acute infection.    HIV 1/2 Antibodies NON REACTIVE NON REACTIVE   Interpretation (HIV Ag Ab)      A non reactive test result means that HIV 1 or HIV 2 antibodies and HIV 1 p24 antigen were not detected in the specimen.    Comment: Performed at [facility], [place] [address] [place], [place] [address]  Hemoglobin A1c     Status: None   Collection Time: 03/10/21  5:43 PM  Result Value Ref Range   Hgb A1c MFr Bld 5.1 4.8 - 5.6 %    Comment: (NOTE) Pre diabetes:          5.7%-6.4%  Diabetes:              >6.4%  Glycemic control for   <7.0% adults with diabetes    Mean Plasma Glucose 99.67 mg/dL    Comment: Performed at [facility] [address], [place], [place] [address]  Urine rapid drug screen (hosp performed)     Status: Abnormal   Collection Time: 03/10/21  6:22 PM  Result Value Ref Range   Opiates NONE DETECTED NONE DETECTED   Cocaine NONE DETECTED NONE DETECTED   Benzodiazepines POSITIVE (A) NONE DETECTED   Amphetamines NONE DETECTED NONE DETECTED   Tetrahydrocannabinol POSITIVE (A) NONE DETECTED   Barbiturates NONE DETECTED NONE DETECTED    Comment: (NOTE) DRUG SCREEN FOR MEDICAL PURPOSES ONLY.  IF CONFIRMATION IS NEEDED FOR ANY PURPOSE, NOTIFY LAB WITHIN 5 DAYS.  LOWEST DETECTABLE LIMITS FOR URINE DRUG SCREEN Drug Class                     Cutoff (ng/mL) Amphetamine and metabolites    1000 Barbiturate and metabolites    200 Benzodiazepine                 013 Tricyclics and metabolites     300 Opiates and metabolites        300 Cocaine and metabolites        300 THC  50 Performed at [facility], [place] [address] [place], [place] [address]   Comprehensive metabolic panel     Status: Abnormal   Collection Time: 03/11/21  8:17 AM  Result Value Ref Range    Sodium 134 (L) 135 - 145 mmol/L   Potassium 3.2 (L) 3.5 - 5.1 mmol/L   Chloride 96 (L) 98 - 111 mmol/L   CO2 30 22 - 32 mmol/L   Glucose, Bld 124 (H) 70 - 99 mg/dL    Comment: Glucose reference range applies only to samples taken after fasting for at least 8 hours.   BUN 5 (L) 6 - 20 mg/dL   Creatinine, Ser 0.76 0.44 - 1.00 mg/dL   Calcium 8.4 (L) 8.9 - 10.3 mg/dL   Total Protein 6.1 (L) 6.5 - 8.1 g/dL   Albumin 2.0 (L) 3.5 - 5.0 g/dL   AST 16 15 - 41 U/L   ALT 13 0 - 44 U/L   Alkaline Phosphatase 121 38 - 126 U/L   Total Bilirubin 0.7 0.3 - 1.2 mg/dL   GFR, Estimated >60 >60 mL/min    Comment: (NOTE) Calculated using the CKD-EPI Creatinine Equation (2021)    Anion gap 8 5 - 15    Comment: Performed at [facility], [place] [address], [place], [place] [address]  Protein / creatinine ratio, urine     Status: Abnormal   Collection Time: 03/11/21  9:52 AM  Result Value Ref Range   Creatinine, Urine 58.57 mg/dL   Total Protein, Urine 24 mg/dL    Comment: NO NORMAL RANGE ESTABLISHED FOR THIS TEST   Protein Creatinine Ratio 0.41 (H) 0.00 - 0.15 mg/mg[Cre]    Comment: Performed at [facility], [place] [address] [place], [place] [address]  Comprehensive metabolic panel     Status: Abnormal   Collection Time: 03/13/21  4:15 AM  Result Value Ref Range   Sodium 134 (L) 135 - 145 mmol/L   Potassium 3.4 (L) 3.5 - 5.1 mmol/L   Chloride 101 98 - 111 mmol/L   CO2 28 22 - 32 mmol/L   Glucose, Bld 85 70 - 99 mg/dL    Comment: Glucose reference range applies only to samples taken after fasting for at least 8 hours.   BUN <5 (L) 6 - 20 mg/dL   Creatinine, Ser 0.64 0.44 - 1.00 mg/dL   Calcium 7.9 (L) 8.9 - 10.3 mg/dL   Total Protein 5.4 (L) 6.5 - 8.1 g/dL   Albumin 1.9 (L) 3.5 - 5.0 g/dL   AST 18 15 - 41 U/L   ALT 15 0 - 44 U/L   Alkaline Phosphatase 124 38 - 126 U/L   Total Bilirubin 0.6 0.3 - 1.2 mg/dL   GFR, Estimated >60 >60 mL/min    Comment: (NOTE) Calculated using the  CKD-EPI Creatinine Equation (2021)    Anion gap 5 5 - 15    Comment: Performed at [facility], [place] [address], [place], [place] [address]  CBC with Differential/Platelet     Status: Abnormal   Collection Time: 03/13/21  4:15 AM  Result Value Ref Range   WBC 11.4 (H) 4.0 - 10.5 K/uL   RBC 4.22 3.87 - 5.11 MIL/uL   Hemoglobin 9.4 (L) 12.0 - 15.0 g/dL   HCT 31.4 (L) 36.0 - 46.0 %   MCV 74.4 (L) 80.0 - 100.0 fL   MCH 22.3 (L) 26.0 - 34.0 pg   MCHC 29.9 (L) 30.0 - 36.0  g/dL   RDW 16.2 (H) 11.5 - 15.5 %   Platelets 213 150 - 400 K/uL   nRBC 0.4 (H) 0.0 - 0.2 %   Neutrophils Relative % 54 %   Neutro Abs 6.2 1.7 - 7.7 K/uL   Lymphocytes Relative 32 %   Lymphs Abs 3.6 0.7 - 4.0 K/uL   Monocytes Relative 5 %   Monocytes Absolute 0.5 0.1 - 1.0 K/uL   Eosinophils Relative 2 %   Eosinophils Absolute 0.2 0.0 - 0.5 K/uL   Basophils Relative 0 %   Basophils Absolute 0.1 0.0 - 0.1 K/uL   Immature Granulocytes 7 %   Abs Immature Granulocytes 0.82 (H) 0.00 - 0.07 K/uL    Comment: Performed at [facility] [address] [place], [place] [address]  Magnesium     Status: Abnormal   Collection Time: 03/13/21  4:15 AM  Result Value Ref Range   Magnesium 1.5 (L) 1.7 - 2.4 mg/dL    Comment: Performed at [facility] [address] [place], [place] [address]  Comprehensive metabolic panel     Status: Abnormal   Collection Time: 03/14/21  5:54 AM  Result Value Ref Range   Sodium 136 135 - 145 mmol/L   Potassium 3.5 3.5 - 5.1 mmol/L   Chloride 104 98 - 111 mmol/L   CO2 25 22 - 32 mmol/L   Glucose, Bld 93 70 - 99 mg/dL    Comment: Glucose reference range applies only to samples taken after fasting for at least 8 hours.   BUN <5 (L) 6 - 20 mg/dL   Creatinine, Ser 0.57 0.44 - 1.00 mg/dL   Calcium 8.3 (L) 8.9 - 10.3 mg/dL   Total Protein 5.4 (L) 6.5 - 8.1 g/dL   Albumin 1.9 (L) 3.5 - 5.0 g/dL   AST 17 15 - 41 U/L   ALT 15 0 - 44 U/L   Alkaline Phosphatase 132 (H) 38 - 126  U/L   Total Bilirubin 0.4 0.3 - 1.2 mg/dL   GFR, Estimated >60 >60 mL/min    Comment: (NOTE) Calculated using the CKD-EPI Creatinine Equation (2021)    Anion gap 7 5 - 15    Comment: Performed at [facility], [place] [address] [place], [place] [address]  Glucose, fasting     Status: None   Collection Time: 03/14/21  5:54 AM  Result Value Ref Range   Glucose, Fasting 92 70 - 99 mg/dL    Comment: Performed at [facility] [address], [place], [place] [address]  Glucose, 2 hour     Status: None   Collection Time: 03/14/21  8:09 AM  Result Value Ref Range   Glucose, 2 hour 80 70 - 139 mg/dL    Comment: Performed at [facility] [address], [place], [place] [address]  Basic metabolic panel     Status: Abnormal   Collection Time: 03/15/21  9:20 AM  Result Value Ref Range   Sodium 133 (L) 135 - 145 mmol/L   Potassium 4.9 3.5 - 5.1 mmol/L   Chloride 102 98 - 111 mmol/L   CO2 23 22 - 32 mmol/L   Glucose, Bld 97 70 - 99 mg/dL    Comment: Glucose reference range applies only to samples taken after fasting for at least 8 hours.   BUN 5 (L) 6 - 20 mg/dL   Creatinine, Ser 0.51 0.44 - 1.00 mg/dL   Calcium 9.0 8.9 -  10.3 mg/dL   GFR, Estimated >60 >60 mL/min    Comment: (NOTE) Calculated using the CKD-EPI Creatinine Equation (2021)    Anion gap 8 5 - 15    Comment: Performed at [facility], [place] [address], [place], [place] [address]  Urine rapid drug screen (hosp performed)     Status: Abnormal   Collection Time: 03/15/21 10:13 AM  Result Value Ref Range   Opiates NONE DETECTED NONE DETECTED   Cocaine NONE DETECTED NONE DETECTED   Benzodiazepines POSITIVE (A) NONE DETECTED   Amphetamines NONE DETECTED NONE DETECTED   Tetrahydrocannabinol POSITIVE (A) NONE DETECTED   Barbiturates NONE DETECTED NONE DETECTED    Comment: (NOTE) DRUG SCREEN FOR MEDICAL PURPOSES ONLY.  IF CONFIRMATION IS NEEDED FOR ANY PURPOSE, NOTIFY LAB WITHIN 5 DAYS.  LOWEST  DETECTABLE LIMITS FOR URINE DRUG SCREEN Drug Class                     Cutoff (ng/mL) Amphetamine and metabolites    1000 Barbiturate and metabolites    200 Benzodiazepine                 009 Tricyclics and metabolites     300 Opiates and metabolites        300 Cocaine and metabolites        300 THC                            50 Performed at [facility], [place] [address] [place], [place] [address]     Treatments: IV hydration, antibiotics: Keflex and zofran, phenergan, methadone  Discharge Exam: Blood pressure 135/80, pulse 61, temperature 98.2 F (36.8 C), temperature source Oral, resp. rate 18, height 5' 4"  (1.626 m), weight 67.1 kg, last menstrual period 08/08/2020, SpO2 99 %. General appearance: alert, cooperative and no distress Resp: normal respiratory effort Cardio: regular rate and rhythm GI: gravid, soft and non-tender Skin: warm and dry  Disposition: Discharge disposition: 01-Home or Self Care       Discharge Instructions    Discharge activity:  No Restrictions   Complete by: As directed    No sexual activity restrictions   Complete by: As directed    Notify physician for a general feeling that "something is not right"   Complete by: As directed    Notify physician for increase or change in vaginal discharge   Complete by: As directed    Notify physician for intestinal cramps, with or without diarrhea, sometimes described as "gas pain"   Complete by: As directed    Notify physician for leaking of fluid   Complete by: As directed    Notify physician for low, dull backache, unrelieved by heat or Tylenol   Complete by: As directed    Notify physician for menstrual like cramps   Complete by: As directed    Notify physician for pelvic pressure   Complete by: As directed    Notify physician for uterine contractions.  These may be painless and feel like the uterus is tightening or the baby is  "balling up"   Complete by: As directed    Notify physician  for vaginal bleeding   Complete by: As directed    PRETERM LABOR:  Includes any of the follwing symptoms that occur between 20 - [redacted] weeks gestation.  If these symptoms are not stopped, preterm labor can result in preterm delivery, placing your baby  at risk   Complete by: As directed      Allergies as of 03/15/2021      Reactions   Fentanyl Hives      Medication List    STOP taking these medications   buprenorphine 8 MG Subl SL tablet Commonly known as: SUBUTEX   citalopram 20 MG tablet Commonly known as: CELEXA   naloxone 4 MG/0.1ML Liqd nasal spray kit Commonly known as: NARCAN   ondansetron 4 MG disintegrating tablet Commonly known as: Zofran ODT   sertraline 50 MG tablet Commonly known as: Zoloft     TAKE these medications   cephALEXin 500 MG capsule Commonly known as: KEFLEX Take 1 capsule (500 mg total) by mouth every 8 (eight) hours for 7 days.   escitalopram 5 MG tablet Commonly known as: LEXAPRO Take 1 tablet (5 mg total) by mouth daily. Start taking on: March 16, 2021   hydrOXYzine 25 MG tablet Commonly known as: ATARAX/VISTARIL Take 2 tablets (50 mg total) by mouth every 8 (eight) hours as needed for anxiety.   ondansetron 4 MG tablet Commonly known as: Zofran Take 1 tablet (4 mg total) by mouth every 8 (eight) hours as needed for nausea or vomiting.   pantoprazole 20 MG tablet Commonly known as: PROTONIX Take 1 tablet (20 mg total) by mouth 2 (two) times daily before a meal. What changed: when to take this       [facility] Follow up.   Specialty: Obstetrics and Gynecology Why: As scheduled on 4/27 Contact information: [place] [id number] [phone]              Signed: Annalee Genta 03/15/2021, 4:52 PM

## 2021-03-15 NOTE — Progress Notes (Signed)
Pt to [place] for BPP. Brought back to unit by [place] tech, patient refused due to nausea. Will try again after Zofran admin.

## 2021-03-15 NOTE — Discharge Instructions (Addendum)
Preeclampsia  Preeclampsia is a serious condition that may develop during pregnancy. This condition involves high blood pressure during pregnancy and causes symptoms such as headaches, vision changes, and increased swelling in the legs, hands, and face. Preeclampsia occurs after 20 weeks of pregnancy. Eclampsia is a seizure that happens from worsening preeclampsia. Diagnosing and managing preeclampsia early is important. If not treated early, it can cause serious problems for mother and baby. There is no cure for this condition. However, during pregnancy, delivering the baby may be the best treatment for preeclampsia or eclampsia. For most women, symptoms of preeclampsia and eclampsia go away after giving birth. In rare cases, a woman may develop preeclampsia or eclampsia after giving birth. This usually occurs within 48 hours after childbirth but may occur up to 6 weeks after giving birth. What are the causes? The cause of this condition is not known. What increases the risk? The following factors make you more likely to develop preeclampsia:  Being pregnant for the first time or being pregnant with multiples.  Having had preeclampsia or a condition called hemolysis, elevated liver enzymes, and low platelet count (HELLP)syndrome during a past pregnancy.  Having a family history of preeclampsia.  Being older than age 55.  Being obese.  Becoming pregnant through fertility treatments. Conditions that reduce blood flow or oxygen to your placenta and baby may also increase your risk. These include:  High blood pressure before, during, or immediately following pregnancy.  Kidney disease.  Diabetes.  Blood clotting disorders.  Autoimmune diseases, such as lupus.  Sleep apnea. What are the signs or symptoms? Common symptoms of this condition include:  A severe, throbbing headache that does not go away.  Vision problems, such as blurred or double vision and light sensitivity.  Pain in  the stomach, especially the right upper region.  Pain in the shoulder. Other symptoms that may develop as the condition gets worse include:  Sudden weight gain because of fluid buildup in the body. This causes swelling of the face, hands, legs, and feet.  Severe nausea and vomiting.  Urinating less than usual.  Shortness of breath.  Seizures. How is this diagnosed? Your health care provider will ask you about symptoms and check for signs of preeclampsia during your prenatal visits. You will also have routine tests, including:  Checking your blood pressure.  Urine tests to check for protein.  Blood tests to assess your organ function.  Monitoring your baby's heart rate.  Ultrasounds to check fetal growth.   How is this treated? You and your health care provider will determine the treatment that is best for you. Treatment may include:  Frequent prenatal visits to check for preeclampsia.  Medicine to lower your blood pressure.  Medicine to prevent seizures.  Low-dose aspirin during your pregnancy.  Staying in the hospital, in severe cases. You will be given medicines to control your blood pressure and the amount of fluids in your body.  Delivering your baby. Work with your health care provider to manage any chronic health conditions, such as diabetes or kidney problems. Also, work with your health care provider to manage weight gain during pregnancy. Follow these instructions at home: Eating and drinking  Drink enough fluid to keep your urine pale yellow.  Avoid caffeine. Caffeine may increase blood pressure and heart rate and lead to dehydration.  Reduce the amount of salt that you eat. Lifestyle  Do not use any products that contain nicotine or tobacco. These products include cigarettes, chewing tobacco, and vaping  devices, such as e-cigarettes. If you need help quitting, ask your health care provider.  Do not use alcohol or drugs.  Avoid stress as much as  possible.  Rest and get plenty of sleep. General instructions  Take over-the-counter and prescription medicines only as told by your health care provider.  When lying down, lie on your left side. This keeps pressure off your major blood vessels.  When sitting or lying down, raise (elevate) your feet. Try putting pillows underneath your lower legs.  Exercise regularly. Ask your health care provider what kinds of exercise are best for you.  Check your blood pressure as often as recommended by your health care provider.  Keep all prenatal and follow-up visits. This is important.   Contact a health care provider if:  You have symptoms that may need treatment or closer monitoring. These include: ? Headaches. ? Stomach pain or nausea and vomiting. ? Shoulder pain. ? Vision problems, such as spots in front of your eyes or blurry vision. ? Sudden weight gain or increased swelling in your face, hands, legs, and feet. ? Increased anxiety or feeling of impending doom. ? Signs or symptoms of labor. Get help right away if:  You have any of the following symptoms: ? A seizure. ? Shortness of breath or trouble breathing. ? Trouble speaking or slurred speech. ? Fainting. ? Chest pain. These symptoms may represent a serious problem that is an emergency. Do not wait to see if the symptoms will go away. Get medical help right away. Call your local emergency services (911 in the [place] Do not drive yourself to the hospital. Summary  Preeclampsia is a serious condition that may develop during pregnancy.  Diagnosing and treating preeclampsia early is very important.  Keep all prenatal and follow-up visits. This is important.  Get help right away if you have a seizure, shortness of breath or trouble breathing, trouble speaking or slurred speech, chest pain, or fainting. This information is not intended to replace advice given to you by your health care provider. Make sure you discuss any  questions you have with your health care provider. Document Revised: 08/06/2020 Document Reviewed: 08/06/2020 Elsevier Patient Education  2021 [employer].

## 2021-03-15 NOTE — Progress Notes (Signed)
CSW followed up with [facility] regarding patient's needed UDS. UDS completed at hospital, [facility] staff reported that it would be adequate. [facility] staff member reported that they could not accept patient at this time due to positive UDS for (Benzodiazipines).   CSW updated patient at bedside. Patient reported that she was agreeable to going to another treatment center for methadone treatment. CSW and patient spoke with staff from [facility] and arranged a new patient appointment for 4/19 at 6:00am. Staff reported that patient needed to bring a photo ID, medicaid information and $15, patient agreed to bring. Patient requested documents to take to appointment, CSW agreed to provide requested documents and obtain patient's signature on patient request for access form. Patient reported that she needed transportation to appointment, CSW agreed to provide taxi voucher to patient's RN. Patient confirmed that she still has her 31 day bus pass provided on Friday. CSW inquired about any additional needs/concerns, patient reported none.   CSW updated patient's RN and attending OB provider.   CSW provided taxi voucher to patient's RN. Patient's RN agreed to provide patient with discharge summary once it's completed.  CSW provided patient with requested documents and obtained signature on patient request for access form.   Patient will follow up with [facility] on 4/19 at 6:00am for Methadone Treatment. Taxi to be arranged at 5:15am to ensure patient arrives to appointment on time.   Celso Sickle, LCSW Clinical Social Worker [facility] Cell#: [phone]

## 2021-03-15 NOTE — Progress Notes (Addendum)
FACULTY PRACTICE ANTEPARTUM(COMPREHENSIVE) NOTE  Meghan Bullock is a 30 y.o. [phone] with Estimated Date of Delivery: 05/15/21   By  best clinical estimate [redacted]w[redacted]d  who is admitted for weight loss/hyperemsis and opioid use disorder.    Fetal presentation is cephalic. Length of Stay:  5  Days  Date of admission:03/10/2021  Subjective: Today she notes that her anxiety has been a continued problem.  The vistaril is not helping as much as she would like.  Previously took valium and has tried other medication like zoloft and celexa that did not work.  She has not seen anyone outside of OB/GYN for these concerns.  She is feeling a little better about leaving the hospital as it sounds as though her mom and grandmother are going to be helping her out. She did have some vomiting yesterday due to glucola, but feels better today.  Tolerating gen diet.  Denies nausea/vomiting today.  Ambulating and using the bathroom without difficulties.  Patient reports the fetal movement as active. Patient reports uterine contraction  activity as none. Patient reports  vaginal bleeding as none. Patient describes fluid per vagina as None.  Vitals:  Blood pressure 139/66, pulse 69, temperature 98.2 F (36.8 C), temperature source Oral, resp. rate 18, height 5\' 4"  (1.626 m), weight 67.1 kg, last menstrual period 08/08/2020, SpO2 99 %. Vitals:   03/14/21 2024 03/15/21 0050 03/15/21 0419 03/15/21 0420  BP:  119/61 139/66   Pulse:  65 69   Resp:  16 18   Temp: 97.8 F (36.6 C) 98.2 F (36.8 C) 98.2 F (36.8 C)   TempSrc: Oral Oral Oral   SpO2: 99% 98%  99%  Weight:      Height:       Physical Examination:  General appearance - alert, well appearing, and in no distress Mental status - normal mood and behavior.  Somewhat anxious Chest - clear to auscultation, no wheezes, rales or rhonchi, symmetric air entry Heart - normal rate and regular rhythm Abdomen - gravid, soft and non-tender Extremities - no pedal  edema noted Skin - warm and dry   Fetal Monitoring:  Baseline: 120 bpm, Variability: moderate, Accelerations: present- 15x15 accels, and Decelerations: Absent    Reactive  Labs:  Results for orders placed or performed during the hospital encounter of 03/10/21 (from the past 24 hour(s))  Basic metabolic panel     Status: Abnormal   Collection Time: 03/15/21  9:20 AM  Result Value Ref Range   Sodium 133 (L) 135 - 145 mmol/L   Potassium 4.9 3.5 - 5.1 mmol/L   Chloride 102 98 - 111 mmol/L   CO2 23 22 - 32 mmol/L   Glucose, Bld 97 70 - 99 mg/dL   BUN 5 (L) 6 - 20 mg/dL   Creatinine, Ser 03/17/21 0.44 - 1.00 mg/dL   Calcium 9.0 8.9 - 7.56 mg/dL   GFR, Estimated 43.3 >29 mL/min   Anion gap 8 5 - 15  Urine rapid drug screen (hosp performed)     Status: Abnormal   Collection Time: 03/15/21 10:13 AM  Result Value Ref Range   Opiates NONE DETECTED NONE DETECTED   Cocaine NONE DETECTED NONE DETECTED   Benzodiazepines POSITIVE (A) NONE DETECTED   Amphetamines NONE DETECTED NONE DETECTED   Tetrahydrocannabinol POSITIVE (A) NONE DETECTED   Barbiturates NONE DETECTED NONE DETECTED     Imaging Studies:    No results found.   Medications:  Scheduled . bacitracin   Topical BID  .  cephALEXin  500 mg Oral Q8H  . docusate sodium  100 mg Oral Daily  . escitalopram  5 mg Oral Daily  . feeding supplement  237 mL Oral BID BM  . methadone  60 mg Oral Daily  . nicotine  14 mg Transdermal Daily  . pantoprazole  20 mg Oral BID  . prenatal multivitamin  1 tablet Oral Q1200   I have reviewed the patient's current medications.  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 Crissie Reese, please refer to the note on 03/11/21 for further details -increased to methadone 60mg  daily -continue vistaril twice daily -added Lexapro 5mg  daily to help with anxiety  - TOC team has coordinated clinical intake at [facility] on 4/19, needs to receive her daily dose of methadone and be discharged at ~0600so that she can get there before 0700 for her intake - Continue comfort meds PRN for withdrawal (Vistaril etc)  #PreE w/o SF -BP remains stable without medication- will continue to monitor closely -pt asymptomatic -continue weekly antepartum testing- will complete BPP on 4/19 prior to discharge  #Weight loss/Hyperemesis -tolerating regular diet -protonix  Oral daily  #Hypokalemia -improved- now within normal limits  #Supervision of high risk pregnancy Has not been engaging in regular prenatal care in clinic. - 2hr glucola attempted- within normal limits though vomiting noted.  Prior normal A1c- low suspicion for DM - TDaP given - 03/10/21 ultrasound showed EFW 1566g/32%, AFI 8.6 cm, cephalic, BPP 8/8  Continue routine antenatal care  Dispo: inpatient, plan for discharge at 0600 on 03/16/2021 to [facility] for methadone clinic intake ADDENDUM- reviewed with social work- 03/12/21 confirmed early am discharge tomorrow 03/18/2021 03/15/2021,10:05 AM

## 2021-03-16 NOTE — Plan of Care (Signed)
  Problem: Health Behavior/Discharge Planning: Goal: Ability to manage health-related needs will improve Outcome: Not Applicable   Problem: Clinical Measurements: Goal: Ability to maintain clinical measurements within normal limits will improve Outcome: Not Applicable Goal: Will remain free from infection Outcome: Not Applicable Goal: Diagnostic test results will improve Outcome: Not Applicable Goal: Respiratory complications will improve Outcome: Not Applicable Goal: Cardiovascular complication will be avoided Outcome: Not Applicable   Problem: Activity: Goal: Risk for activity intolerance will decrease Outcome: Not Applicable   Problem: Nutrition: Goal: Adequate nutrition will be maintained Outcome: Not Applicable   Problem: Coping: Goal: Level of anxiety will decrease Outcome: Not Applicable   Problem: Elimination: Goal: Will not experience complications related to bowel motility Outcome: Not Applicable   Problem: Pain Managment: Goal: General experience of comfort will improve Outcome: Not Applicable   Problem: Safety: Goal: Ability to remain free from injury will improve Outcome: Not Applicable   Problem: Skin Integrity: Goal: Risk for impaired skin integrity will decrease Outcome: Not Applicable   Problem: Education: Goal: Knowledge of disease or condition will improve Outcome: Not Applicable Goal: Knowledge of the prescribed therapeutic regimen will improve Outcome: Not Applicable Goal: Individualized Educational Video(s) Outcome: Not Applicable   Problem: Clinical Measurements: Goal: Complications related to the disease process, condition or treatment will be avoided or minimized Outcome: Not Applicable   Problem: Education: Goal: Ability to describe self-care measures that may prevent or decrease complications (Diabetes Survival Skills Education) will improve Outcome: Not Applicable Goal: Individualized Educational Video(s) Outcome: Not  Applicable   Problem: Coping: Goal: Ability to adjust to condition or change in health will improve Outcome: Not Applicable   Problem: Fluid Volume: Goal: Ability to maintain a balanced intake and output will improve Outcome: Not Applicable   Problem: Health Behavior/Discharge Planning: Goal: Ability to identify and utilize available resources and services will improve Outcome: Not Applicable Goal: Ability to manage health-related needs will improve Outcome: Not Applicable   Problem: Metabolic: Goal: Ability to maintain appropriate glucose levels will improve Outcome: Not Applicable   Problem: Nutritional: Goal: Maintenance of adequate nutrition will improve Outcome: Not Applicable Goal: Progress toward achieving an optimal weight will improve Outcome: Not Applicable   Problem: Tissue Perfusion: Goal: Adequacy of tissue perfusion will improve Outcome: Not Applicable

## 2021-03-16 NOTE — Progress Notes (Signed)
Patient discharged, ambulatory to [facility] with belongings. Questions asked and answered.  Discharge papers given to patient.  Escorted by [employer], NT with taxi voucher.

## 2021-03-24 ENCOUNTER — Other Ambulatory Visit: Payer: Self-pay

## 2021-03-24 ENCOUNTER — Telehealth: Payer: Self-pay | Admitting: Family Medicine

## 2021-03-24 ENCOUNTER — Encounter: Payer: Self-pay | Admitting: Family Medicine

## 2021-03-24 NOTE — Telephone Encounter (Signed)
Patient NO-SOWED today's Visit, spoke to Dr.Eckstat and he said he will follow up with the patient, he stated he sent her several messages.

## 2021-03-26 ENCOUNTER — Ambulatory Visit (INDEPENDENT_AMBULATORY_CARE_PROVIDER_SITE_OTHER): Payer: Medicaid Other

## 2021-03-26 ENCOUNTER — Encounter: Payer: Self-pay | Admitting: Family Medicine

## 2021-03-26 ENCOUNTER — Other Ambulatory Visit: Payer: Self-pay

## 2021-03-26 ENCOUNTER — Ambulatory Visit (INDEPENDENT_AMBULATORY_CARE_PROVIDER_SITE_OTHER): Payer: Medicaid Other | Admitting: Family Medicine

## 2021-03-26 VITALS — BP 137/90 | HR 80 | Wt 171.1 lb

## 2021-03-26 DIAGNOSIS — O0990 Supervision of high risk pregnancy, unspecified, unspecified trimester: Secondary | ICD-10-CM

## 2021-03-26 DIAGNOSIS — B182 Chronic viral hepatitis C: Secondary | ICD-10-CM

## 2021-03-26 DIAGNOSIS — Z3A32 32 weeks gestation of pregnancy: Secondary | ICD-10-CM | POA: Diagnosis not present

## 2021-03-26 DIAGNOSIS — O99323 Drug use complicating pregnancy, third trimester: Secondary | ICD-10-CM

## 2021-03-26 DIAGNOSIS — F1120 Opioid dependence, uncomplicated: Secondary | ICD-10-CM

## 2021-03-26 DIAGNOSIS — Z23 Encounter for immunization: Secondary | ICD-10-CM

## 2021-03-26 DIAGNOSIS — O1403 Mild to moderate pre-eclampsia, third trimester: Secondary | ICD-10-CM | POA: Diagnosis not present

## 2021-03-26 DIAGNOSIS — O09892 Supervision of other high risk pregnancies, second trimester: Secondary | ICD-10-CM

## 2021-03-26 LAB — POCT URINALYSIS DIP (DEVICE)
Bilirubin Urine: NEGATIVE
Glucose, UA: NEGATIVE mg/dL
Hgb urine dipstick: NEGATIVE
Ketones, ur: NEGATIVE mg/dL
Nitrite: NEGATIVE
Protein, ur: NEGATIVE mg/dL
Specific Gravity, Urine: 1.025 (ref 1.005–1.030)
Urobilinogen, UA: 1 mg/dL (ref 0.0–1.0)
pH: 7.5 (ref 5.0–8.0)

## 2021-03-26 MED ORDER — FERROUS SULFATE 325 (65 FE) MG PO TBEC
325.0000 mg | DELAYED_RELEASE_TABLET | ORAL | 2 refills | Status: DC
Start: 2021-03-26 — End: 2022-02-04

## 2021-03-26 NOTE — Patient Instructions (Signed)
 Contraception Choices Contraception, also called birth control, refers to methods or devices that prevent pregnancy. Hormonal methods Contraceptive implant A contraceptive implant is a thin, plastic tube that contains a hormone that prevents pregnancy. It is different from an intrauterine device (IUD). It is inserted into the upper part of the arm by a health care provider. Implants can be effective for up to 3 years. Progestin-only injections Progestin-only injections are injections of progestin, a synthetic form of the hormone progesterone. They are given every 3 months by a health care provider. Birth control pills Birth control pills are pills that contain hormones that prevent pregnancy. They must be taken once a day, preferably at the same time each day. A prescription is needed to use this method of contraception. Birth control patch The birth control patch contains hormones that prevent pregnancy. It is placed on the skin and must be changed once a week for three weeks and removed on the fourth week. A prescription is needed to use this method of contraception. Vaginal ring A vaginal ring contains hormones that prevent pregnancy. It is placed in the vagina for three weeks and removed on the fourth week. After that, the process is repeated with a new ring. A prescription is needed to use this method of contraception. Emergency contraceptive Emergency contraceptives prevent pregnancy after unprotected sex. They come in pill form and can be taken up to 5 days after sex. They work best the sooner they are taken after having sex. Most emergency contraceptives are available without a prescription. This method should not be used as your only form of birth control.   Barrier methods Female condom A female condom is a thin sheath that is worn over the penis during sex. Condoms keep sperm from going inside a woman's body. They can be used with a sperm-killing substance (spermicide) to increase their  effectiveness. They should be thrown away after one use. Female condom A female condom is a soft, loose-fitting sheath that is put into the vagina before sex. The condom keeps sperm from going inside a woman's body. They should be thrown away after one use. Diaphragm A diaphragm is a soft, dome-shaped barrier. It is inserted into the vagina before sex, along with a spermicide. The diaphragm blocks sperm from entering the uterus, and the spermicide kills sperm. A diaphragm should be left in the vagina for 6-8 hours after sex and removed within 24 hours. A diaphragm is prescribed and fitted by a health care provider. A diaphragm should be replaced every 1-2 years, after giving birth, after gaining more than 15 lb (6.8 kg), and after pelvic surgery. Cervical cap A cervical cap is a round, soft latex or plastic cup that fits over the cervix. It is inserted into the vagina before sex, along with spermicide. It blocks sperm from entering the uterus. The cap should be left in place for 6-8 hours after sex and removed within 48 hours. A cervical cap must be prescribed and fitted by a health care provider. It should be replaced every 2 years. Sponge A sponge is a soft, circular piece of polyurethane foam with spermicide in it. The sponge helps block sperm from entering the uterus, and the spermicide kills sperm. To use it, you make it wet and then insert it into the vagina. It should be inserted before sex, left in for at least 6 hours after sex, and removed and thrown away within 30 hours. Spermicides Spermicides are chemicals that kill or block sperm from entering the   cervix and uterus. They can come as a cream, jelly, suppository, foam, or tablet. A spermicide should be inserted into the vagina with an applicator at least 10-15 minutes before sex to allow time for it to work. The process must be repeated every time you have sex. Spermicides do not require a prescription.   Intrauterine  contraception Intrauterine device (IUD) An IUD is a T-shaped device that is put in a woman's uterus. There are two types:  Hormone IUD.This type contains progestin, a synthetic form of the hormone progesterone. This type can stay in place for 3-5 years.  Copper IUD.This type is wrapped in copper wire. It can stay in place for 10 years. Permanent methods of contraception Female tubal ligation In this method, a woman's fallopian tubes are sealed, tied, or blocked during surgery to prevent eggs from traveling to the uterus. Hysteroscopic sterilization In this method, a small, flexible insert is placed into each fallopian tube. The inserts cause scar tissue to form in the fallopian tubes and block them, so sperm cannot reach an egg. The procedure takes about 3 months to be effective. Another form of birth control must be used during those 3 months. Female sterilization This is a procedure to tie off the tubes that carry sperm (vasectomy). After the procedure, the man can still ejaculate fluid (semen). Another form of birth control must be used for 3 months after the procedure. Natural planning methods Natural family planning In this method, a couple does not have sex on days when the woman could become pregnant. Calendar method In this method, the woman keeps track of the length of each menstrual cycle, identifies the days when pregnancy can happen, and does not have sex on those days. Ovulation method In this method, a couple avoids sex during ovulation. Symptothermal method This method involves not having sex during ovulation. The woman typically checks for ovulation by watching changes in her temperature and in the consistency of cervical mucus. Post-ovulation method In this method, a couple waits to have sex until after ovulation. Where to find more information  Centers for Disease Control and Prevention: www.cdc.gov Summary  Contraception, also called birth control, refers to methods or  devices that prevent pregnancy.  Hormonal methods of contraception include implants, injections, pills, patches, vaginal rings, and emergency contraceptives.  Barrier methods of contraception can include female condoms, female condoms, diaphragms, cervical caps, sponges, and spermicides.  There are two types of IUDs (intrauterine devices). An IUD can be put in a woman's uterus to prevent pregnancy for 3-5 years.  Permanent sterilization can be done through a procedure for males and females. Natural family planning methods involve nothaving sex on days when the woman could become pregnant. This information is not intended to replace advice given to you by your health care provider. Make sure you discuss any questions you have with your health care provider. Document Revised: 04/20/2020 Document Reviewed: 04/20/2020 Elsevier Patient Education  2021 Else[employer] Inc.   Breastfeeding  Choosing to breastfeed is one of the best decisions you can make for yourself and your baby. A change in hormones during pregnancy causes your breasts to make breast milk in your milk-producing glands. Hormones prevent breast milk from being released before your baby is born. They also prompt milk flow after birth. Once breastfeeding has begun, thoughts of your baby, as well as his or her sucking or crying, can stimulate the release of milk from your milk-producing glands. Benefits of breastfeeding Research shows that breastfeeding offers many health benefits   for infants and mothers. It also offers a cost-free and convenient way to feed your baby. For your baby  Your first milk (colostrum) helps your baby's digestive system to function better.  Special cells in your milk (antibodies) help your baby to fight off infections.  Breastfed babies are less likely to develop asthma, allergies, obesity, or type 2 diabetes. They are also at lower risk for sudden infant death syndrome (SIDS).  Nutrients in breast milk are better  able to meet your baby's needs compared to infant formula.  Breast milk improves your baby's brain development. For you  Breastfeeding helps to create a very special bond between you and your baby.  Breastfeeding is convenient. Breast milk costs nothing and is always available at the correct temperature.  Breastfeeding helps to burn calories. It helps you to lose the weight that you gained during pregnancy.  Breastfeeding makes your uterus return faster to its size before pregnancy. It also slows bleeding (lochia) after you give birth.  Breastfeeding helps to lower your risk of developing type 2 diabetes, osteoporosis, rheumatoid arthritis, cardiovascular disease, and breast, ovarian, uterine, and endometrial cancer later in life. Breastfeeding basics Starting breastfeeding  Find a comfortable place to sit or lie down, with your neck and back well-supported.  Place a pillow or a rolled-up blanket under your baby to bring him or her to the level of your breast (if you are seated). Nursing pillows are specially designed to help support your arms and your baby while you breastfeed.  Make sure that your baby's tummy (abdomen) is facing your abdomen.  Gently massage your breast. With your fingertips, massage from the outer edges of your breast inward toward the nipple. This encourages milk flow. If your milk flows slowly, you may need to continue this action during the feeding.  Support your breast with 4 fingers underneath and your thumb above your nipple (make the letter "C" with your hand). Make sure your fingers are well away from your nipple and your baby's mouth.  Stroke your baby's lips gently with your finger or nipple.  When your baby's mouth is open wide enough, quickly bring your baby to your breast, placing your entire nipple and as much of the areola as possible into your baby's mouth. The areola is the colored area around your nipple. ? More areola should be visible above your  baby's upper lip than below the lower lip. ? Your baby's lips should be opened and extended outward (flanged) to ensure an adequate, comfortable latch. ? Your baby's tongue should be between his or her lower gum and your breast.  Make sure that your baby's mouth is correctly positioned around your nipple (latched). Your baby's lips should create a seal on your breast and be turned out (everted).  It is common for your baby to suck about 2-3 minutes in order to start the flow of breast milk. Latching Teaching your baby how to latch onto your breast properly is very important. An improper latch can cause nipple pain, decreased milk supply, and poor weight gain in your baby. Also, if your baby is not latched onto your nipple properly, he or she may swallow some air during feeding. This can make your baby fussy. Burping your baby when you switch breasts during the feeding can help to get rid of the air. However, teaching your baby to latch on properly is still the best way to prevent fussiness from swallowing air while breastfeeding. Signs that your baby has successfully latched onto   your nipple  Silent tugging or silent sucking, without causing you pain. Infant's lips should be extended outward (flanged).  Swallowing heard between every 3-4 sucks once your milk has started to flow (after your let-down milk reflex occurs).  Muscle movement above and in front of his or her ears while sucking. Signs that your baby has not successfully latched onto your nipple  Sucking sounds or smacking sounds from your baby while breastfeeding.  Nipple pain. If you think your baby has not latched on correctly, slip your finger into the corner of your baby's mouth to break the suction and place it between your baby's gums. Attempt to start breastfeeding again. Signs of successful breastfeeding Signs from your baby  Your baby will gradually decrease the number of sucks or will completely stop sucking.  Your baby  will fall asleep.  Your baby's body will relax.  Your baby will retain a small amount of milk in his or her mouth.  Your baby will let go of your breast by himself or herself. Signs from you  Breasts that have increased in firmness, weight, and size 1-3 hours after feeding.  Breasts that are softer immediately after breastfeeding.  Increased milk volume, as well as a change in milk consistency and color by the fifth day of breastfeeding.  Nipples that are not sore, cracked, or bleeding. Signs that your baby is getting enough milk  Wetting at least 1-2 diapers during the first 24 hours after birth.  Wetting at least 5-6 diapers every 24 hours for the first week after birth. The urine should be clear or pale yellow by the age of 5 days.  Wetting 6-8 diapers every 24 hours as your baby continues to grow and develop.  At least 3 stools in a 24-hour period by the age of 5 days. The stool should be soft and yellow.  At least 3 stools in a 24-hour period by the age of 7 days. The stool should be seedy and yellow.  No loss of weight greater than 10% of birth weight during the first 3 days of life.  Average weight gain of 4-7 oz (113-198 g) per week after the age of 4 days.  Consistent daily weight gain by the age of 5 days, without weight loss after the age of 2 weeks. After a feeding, your baby may spit up a small amount of milk. This is normal. Breastfeeding frequency and duration Frequent feeding will help you make more milk and can prevent sore nipples and extremely full breasts (breast engorgement). Breastfeed when you feel the need to reduce the fullness of your breasts or when your baby shows signs of hunger. This is called "breastfeeding on demand." Signs that your baby is hungry include:  Increased alertness, activity, or restlessness.  Movement of the head from side to side.  Opening of the mouth when the corner of the mouth or cheek is stroked (rooting).  Increased  sucking sounds, smacking lips, cooing, sighing, or squeaking.  Hand-to-mouth movements and sucking on fingers or hands.  Fussing or crying. Avoid introducing a pacifier to your baby in the first 4-6 weeks after your baby is born. After this time, you may choose to use a pacifier. Research has shown that pacifier use during the first year of a baby's life decreases the risk of sudden infant death syndrome (SIDS). Allow your baby to feed on each breast as long as he or she wants. When your baby unlatches or falls asleep while feeding from the   first breast, offer the second breast. Because newborns are often sleepy in the first few weeks of life, you may need to awaken your baby to get him or her to feed. Breastfeeding times will vary from baby to baby. However, the following rules can serve as a guide to help you make sure that your baby is properly fed:  Newborns (babies 4 weeks of age or younger) may breastfeed every 1-3 hours.  Newborns should not go without breastfeeding for longer than 3 hours during the day or 5 hours during the night.  You should breastfeed your baby a minimum of 8 times in a 24-hour period. Breast milk pumping Pumping and storing breast milk allows you to make sure that your baby is exclusively fed your breast milk, even at times when you are unable to breastfeed. This is especially important if you go back to work while you are still breastfeeding, or if you are not able to be present during feedings. Your lactation consultant can help you find a method of pumping that works best for you and give you guidelines about how long it is safe to store breast milk.      Caring for your breasts while you breastfeed Nipples can become dry, cracked, and sore while breastfeeding. The following recommendations can help keep your breasts moisturized and healthy:  Avoid using soap on your nipples.  Wear a supportive bra designed especially for nursing. Avoid wearing underwire-style  bras or extremely tight bras (sports bras).  Air-dry your nipples for 3-4 minutes after each feeding.  Use only cotton bra pads to absorb leaked breast milk. Leaking of breast milk between feedings is normal.  Use lanolin on your nipples after breastfeeding. Lanolin helps to maintain your skin's normal moisture barrier. Pure lanolin is not harmful (not toxic) to your baby. You may also hand express a few drops of breast milk and gently massage that milk into your nipples and allow the milk to air-dry. In the first few weeks after giving birth, some women experience breast engorgement. Engorgement can make your breasts feel heavy, warm, and tender to the touch. Engorgement peaks within 3-5 days after you give birth. The following recommendations can help to ease engorgement:  Completely empty your breasts while breastfeeding or pumping. You may want to start by applying warm, moist heat (in the shower or with warm, water-soaked hand towels) just before feeding or pumping. This increases circulation and helps the milk flow. If your baby does not completely empty your breasts while breastfeeding, pump any extra milk after he or she is finished.  Apply ice packs to your breasts immediately after breastfeeding or pumping, unless this is too uncomfortable for you. To do this: ? Put ice in a plastic bag. ? Place a towel between your skin and the bag. ? Leave the ice on for 20 minutes, 2-3 times a day.  Make sure that your baby is latched on and positioned properly while breastfeeding. If engorgement persists after 48 hours of following these recommendations, contact your health care provider or a lactation consultant. Overall health care recommendations while breastfeeding  Eat 3 healthy meals and 3 snacks every day. Well-nourished mothers who are breastfeeding need an additional 450-500 calories a day. You can meet this requirement by increasing the amount of a balanced diet that you eat.  Drink  enough water to keep your urine pale yellow or clear.  Rest often, relax, and continue to take your prenatal vitamins to prevent fatigue, stress, and low   vitamin and mineral levels in your body (nutrient deficiencies).  Do not use any products that contain nicotine or tobacco, such as cigarettes and e-cigarettes. Your baby may be harmed by chemicals from cigarettes that pass into breast milk and exposure to secondhand smoke. If you need help quitting, ask your health care provider.  Avoid alcohol.  Do not use illegal drugs or marijuana.  Talk with your health care provider before taking any medicines. These include over-the-counter and prescription medicines as well as vitamins and herbal supplements. Some medicines that may be harmful to your baby can pass through breast milk.  It is possible to become pregnant while breastfeeding. If birth control is desired, ask your health care provider about options that will be safe while breastfeeding your baby. Where to find more information: [employer]: www.llli.org Contact a health care provider if:  You feel like you want to stop breastfeeding or have become frustrated with breastfeeding.  Your nipples are cracked or bleeding.  Your breasts are red, tender, or warm.  You have: ? Painful breasts or nipples. ? A swollen area on either breast. ? A fever or chills. ? Nausea or vomiting. ? Drainage other than breast milk from your nipples.  Your breasts do not become full before feedings by the fifth day after you give birth.  You feel sad and depressed.  Your baby is: ? Too sleepy to eat well. ? Having trouble sleeping. ? More than 1 week old and wetting fewer than 6 diapers in a 24-hour period. ? Not gaining weight by 5 days of age.  Your baby has fewer than 3 stools in a 24-hour period.  Your baby's skin or the white parts of his or her eyes become yellow. Get help right away if:  Your baby is overly tired  (lethargic) and does not want to wake up and feed.  Your baby develops an unexplained fever. Summary  Breastfeeding offers many health benefits for infant and mothers.  Try to breastfeed your infant when he or she shows early signs of hunger.  Gently tickle or stroke your baby's lips with your finger or nipple to allow the baby to open his or her mouth. Bring the baby to your breast. Make sure that much of the areola is in your baby's mouth. Offer one side and burp the baby before you offer the other side.  Talk with your health care provider or lactation consultant if you have questions or you face problems as you breastfeed. This information is not intended to replace advice given to you by your health care provider. Make sure you discuss any questions you have with your health care provider. Document Revised: 02/08/2018 Document Reviewed: 12/16/2016 Elsevier Patient Education  2021 Elsevier Inc.  

## 2021-03-26 NOTE — Progress Notes (Addendum)
   Subjective:  Meghan Bullock is a 30 y.o. [id number] at [redacted]w[redacted]d being seen today for ongoing prenatal care.  She is currently monitored for the following issues for this high-risk pregnancy and has Heroin abuse (HCC); Encephalopathy, toxic; Benzodiazepine abuse (HCC); Hepatitis C; Substance abuse affecting pregnancy, antepartum; History of preterm delivery, currently pregnant in second trimester; Iron deficiency anemia; Cellulitis and abscess of hand; 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HCC); Abscess of wrist; Substance use disorder; Supervision of high risk pregnancy, antepartum; Pre-eclampsia, mild, antepartum, third trimester; Anxiety; Malnutrition of moderate degree; Opioid use disorder, severe, dependence (HCC); [redacted] weeks gestation of pregnancy; and 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  Patient reports no complaints.  Contractions: Irritability. Vag. Bleeding: None.  Movement: Present. Denies leaking of fluid.   The following portions of the patient's history were reviewed and updated as appropriate: allergies, current medications, past family history, past medical history, past social history, past surgical history and problem list. Problem list updated.  Objective:   Vitals:   03/26/21 0921  BP: 137/90  Pulse: 80  Weight: 171 lb 1.6 oz (77.6 kg)    Fetal Status: Fetal Heart Rate (bpm): 128   Movement: Present  Presentation: Vertex  General:  Alert, oriented and cooperative. Patient is in no acute distress.  Skin: Skin is warm and dry. No rash noted.   Cardiovascular: Normal heart rate noted  Respiratory: Normal respiratory effort, no problems with respiration noted  Abdomen: Soft, gravid, appropriate for gestational age. Pain/Pressure: Present     Pelvic: Vag. Bleeding: None     Cervical exam deferred        Extremities: Normal range of motion.  Edema: Trace  Mental Status: Normal mood and affect. Normal behavior. Normal judgment and  thought content.   Urinalysis:      Assessment and Plan:  Pregnancy: [id number] at [redacted]w[redacted]d  1. Supervision of high risk pregnancy, antepartum BP borderline elevated, known PreE w mild features FHR normal Reports she feels she is doing much better since admission. No longer with her partner, has gone back to living with her mother and grandmother who are supporting her and helping her get to her methadone clinic daily.   2. 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 History of preterm delivery, currently pregnant in second trimester   4. Pre-eclampsia, mild, antepartum, third trimester BP mild range Given 32 weeks attempted to perform BPP in clinic which resulted 6/10 for breathing and tone Notably patient just increased methadone this morning and took dose just prior to coming to clinic NST was reactive though shortened given she had to leave  After discussion with Dr. Alysia Penna will have her go to [facility]U tomorrow for repeat BPP in the afternoon, given precautions Will need follow up growth in a few weeks, message sent to schedule  Addendum: After further review of images with RN BPP 8/10 with tone noted. Breathing also seen but not sustained.  5. Chronic hepatitis C without hepatic coma (HCC) Cleared infection, RNA quant neg on initial OB labs  Preterm labor symptoms and general obstetric precautions including but not limited to vaginal bleeding, contractions, leaking of fluid and fetal movement were reviewed in detail with the patient. Please refer to After Visit Summary for other counseling recommendations.  Return in 2 weeks (on 04/09/2021) for [facility], ob visit.   Venora Maples, MD

## 2021-03-28 ENCOUNTER — Telehealth: Payer: Self-pay | Admitting: Student

## 2021-03-28 NOTE — Telephone Encounter (Signed)
Attempted to contact patient about missed [facility] visit. Left voicemail to return call or come to the hospital.  Patient needs BPP/NST  Judeth Horn, NP

## 2021-03-29 ENCOUNTER — Encounter: Payer: Self-pay | Admitting: *Deleted

## 2021-03-29 ENCOUNTER — Other Ambulatory Visit: Payer: Self-pay | Admitting: *Deleted

## 2021-03-29 ENCOUNTER — Inpatient Hospital Stay (HOSPITAL_COMMUNITY)
Admission: AD | Admit: 2021-03-29 | Discharge: 2021-03-29 | Discharge status: Against Medical Advice | Payer: Medicaid Other | Attending: Family Medicine | Admitting: Family Medicine

## 2021-03-29 ENCOUNTER — Other Ambulatory Visit: Payer: Self-pay

## 2021-03-29 DIAGNOSIS — F1910 Other psychoactive substance abuse, uncomplicated: Secondary | ICD-10-CM

## 2021-03-29 DIAGNOSIS — O0990 Supervision of high risk pregnancy, unspecified, unspecified trimester: Secondary | ICD-10-CM

## 2021-03-29 DIAGNOSIS — O09892 Supervision of other high risk pregnancies, second trimester: Secondary | ICD-10-CM

## 2021-03-29 NOTE — Progress Notes (Signed)
[place] ordered and scheduled as requested from Dr. Antony Madura

## 2021-03-29 NOTE — MAU Note (Signed)
Attempted to retrieve patient from lobby at 1035. Patient nowhere to be found. Registration states patient went to the restroom, changed her clothes, took her belongings, and left.   Candelaria Celeste, MD notified.

## 2021-04-01 ENCOUNTER — Telehealth: Payer: Self-pay

## 2021-04-01 NOTE — Telephone Encounter (Signed)
Pt called stating that she has questions about her induction and that she was informed that she would need to come every week.  Called pt @ [phone] and left message that I am returning her call if she continues to have questions or concerns to please give the office a call back.    Addison Naegeli, RN  04/01/21

## 2021-04-05 ENCOUNTER — Telehealth: Payer: Self-pay | Admitting: Lactation Services

## 2021-04-05 NOTE — Telephone Encounter (Signed)
Patient called again and LM that she would like a call back from the nurse.   Attempted to call patient back and received messages that call could not be completed at this time, attempted x 3. Will need to try again at a later time.

## 2021-04-05 NOTE — Telephone Encounter (Signed)
Called patient again and received message that call could not be completed at this time.

## 2021-04-05 NOTE — Telephone Encounter (Signed)
Patient called and LM that she had questions about when she will be seen today. She is concerned she may be having contractions and wants to know if she needs to be seen weekly.   Per Dr. Audie Clear note, patient was to be seen in 2 weeks or around 5/13 and has an appointment scheduled for 5/17. She has an MFM appointment on 5/13 also.   Returned patients call at [phone], per patient. She did not answer. LM for patient to call the office if she has continued questions or concerns. My Chart Message sent.

## 2021-04-07 ENCOUNTER — Other Ambulatory Visit: Payer: Self-pay

## 2021-04-07 ENCOUNTER — Encounter (HOSPITAL_COMMUNITY): Payer: Self-pay | Admitting: Obstetrics and Gynecology

## 2021-04-07 ENCOUNTER — Inpatient Hospital Stay (HOSPITAL_BASED_OUTPATIENT_CLINIC_OR_DEPARTMENT_OTHER): Payer: Medicaid Other

## 2021-04-07 ENCOUNTER — Inpatient Hospital Stay (HOSPITAL_COMMUNITY)
Admission: AD | Admit: 2021-04-07 | Discharge: 2021-04-07 | Discharge status: Against Medical Advice | Payer: Medicaid Other | Attending: Obstetrics and Gynecology | Admitting: Obstetrics and Gynecology

## 2021-04-07 DIAGNOSIS — O99323 Drug use complicating pregnancy, third trimester: Secondary | ICD-10-CM

## 2021-04-07 DIAGNOSIS — F1120 Opioid dependence, uncomplicated: Secondary | ICD-10-CM

## 2021-04-07 DIAGNOSIS — O99333 Smoking (tobacco) complicating pregnancy, third trimester: Secondary | ICD-10-CM | POA: Insufficient documentation

## 2021-04-07 DIAGNOSIS — O1493 Unspecified pre-eclampsia, third trimester: Secondary | ICD-10-CM | POA: Insufficient documentation

## 2021-04-07 DIAGNOSIS — Z3A34 34 weeks gestation of pregnancy: Secondary | ICD-10-CM | POA: Insufficient documentation

## 2021-04-07 DIAGNOSIS — B182 Chronic viral hepatitis C: Secondary | ICD-10-CM

## 2021-04-07 DIAGNOSIS — F17210 Nicotine dependence, cigarettes, uncomplicated: Secondary | ICD-10-CM | POA: Diagnosis not present

## 2021-04-07 DIAGNOSIS — O98413 Viral hepatitis complicating pregnancy, third trimester: Secondary | ICD-10-CM | POA: Diagnosis not present

## 2021-04-07 DIAGNOSIS — Z5329 Procedure and treatment not carried out because of patient's decision for other reasons: Secondary | ICD-10-CM

## 2021-04-07 DIAGNOSIS — F1290 Cannabis use, unspecified, uncomplicated: Secondary | ICD-10-CM

## 2021-04-07 DIAGNOSIS — Z5321 Procedure and treatment not carried out due to patient leaving prior to being seen by health care provider: Secondary | ICD-10-CM | POA: Diagnosis not present

## 2021-04-07 LAB — COMPREHENSIVE METABOLIC PANEL
ALT: 10 U/L (ref 0–44)
AST: 16 U/L (ref 15–41)
Albumin: 2.5 g/dL — ABNORMAL LOW (ref 3.5–5.0)
Alkaline Phosphatase: 136 U/L — ABNORMAL HIGH (ref 38–126)
Anion gap: 6 (ref 5–15)
BUN: 6 mg/dL (ref 6–20)
CO2: 24 mmol/L (ref 22–32)
Calcium: 8.7 mg/dL — ABNORMAL LOW (ref 8.9–10.3)
Chloride: 104 mmol/L (ref 98–111)
Creatinine, Ser: 0.52 mg/dL (ref 0.44–1.00)
GFR, Estimated: >60 mL/min (ref >60)
Glucose, Bld: 67 mg/dL — ABNORMAL LOW (ref 70–99)
Potassium: 3.3 mmol/L — ABNORMAL LOW (ref 3.5–5.1)
Sodium: 134 mmol/L — ABNORMAL LOW (ref 135–145)
Total Bilirubin: 0.3 mg/dL (ref 0.3–1.2)
Total Protein: 6.1 g/dL — ABNORMAL LOW (ref 6.5–8.1)

## 2021-04-07 LAB — CBC
HCT: 27 % — ABNORMAL LOW (ref 36.0–46.0)
Hemoglobin: 7.9 g/dL — ABNORMAL LOW (ref 12.0–15.0)
MCH: 20.8 pg — ABNORMAL LOW (ref 26.0–34.0)
MCHC: 29.3 g/dL — ABNORMAL LOW (ref 30.0–36.0)
MCV: 71.2 fL — ABNORMAL LOW (ref 80.0–100.0)
Platelets: 297 10*3/uL (ref 150–400)
RBC: 3.79 MIL/uL — ABNORMAL LOW (ref 3.87–5.11)
RDW: 17.2 % — ABNORMAL HIGH (ref 11.5–15.5)
WBC: 10.9 10*3/uL — ABNORMAL HIGH (ref 4.0–10.5)
nRBC: 0 % (ref 0.0–0.2)

## 2021-04-07 LAB — PROTEIN / CREATININE RATIO, URINE
Creatinine, Urine: 127.51 mg/dL
Protein Creatinine Ratio: 0.14 mg/mg{Cre} (ref 0.00–0.15)
Total Protein, Urine: 18 mg/dL

## 2021-04-07 NOTE — MAU Provider Note (Signed)
History     CSN: [id number]  Arrival date and time: 04/07/21 1124   Event Date/Time   First Provider Initiated Contact with Patient 04/07/21 1213      Chief Complaint  Patient presents with  . Fetal Monitoring  . Contractions   HPI  Meghan Bullock is a 30 y.o. [id number] at [redacted]w[redacted]d who presents for fetal monitoring. Had BPP in the office on 4/29 due to methadone dose change. That day her BPP was 6/10. She was supposed to come to MAU the next day for repeat BPP but has not been seen since. Reports she is hear today for the repeat BPP.  Reports feeling some intermittent abdominal tightening. Denies pain with these contractions & can't tell how frequent they are.  States she was previously diagnosed with preeclampsia. Has been having headaches & dizziness but currently denies symptoms.  Denies vaginal bleeding or LOF. Reports good fetal movement. Has taken her methadone dose today.   OB History    Gravida  4   Para  2   Term  1   Preterm  1   AB  1   Living  2     SAB  1   IAB      Ectopic      Multiple  0   Live Births  2           Past Medical History:  Diagnosis Date  . Anemia   . Anxiety   . Bipolar 1 disorder (HCC)   . Chlamydia   . Hepatitis C   . Heroin abuse (HCC)   . Mononucleosis   . Ovarian cyst   . Polysubstance abuse (HCC)   . Preterm premature rupture of membranes (PPROM) with unknown onset of labor 05/12/2014    Past Surgical History:  Procedure Laterality Date  . HAND SURGERY    . I & D EXTREMITY Left 09/16/2019   Procedure: IRRIGATION AND DEBRIDEMENT EXTREMITY;  Surgeon: Ernest Mallick, MD;  Location: [facility] OR;  Service: Orthopedics;  Laterality: Left;    Family History  Problem Relation Age of Onset  . COPD Mother   . Anxiety disorder Mother   . Emphysema Mother     Social History   Tobacco Use  . Smoking status: Current Every Day Smoker    Packs/day: 1.50    Years: 5.00    Pack years: 7.50    Types: Cigarettes   . Smokeless tobacco: Never Used  Vaping Use  . Vaping Use: Never used  Substance Use Topics  . Alcohol use: No  . Drug use: Yes    Types: Heroin, Marijuana    Comment: fentanyl-heroin 09/29/2020    Allergies:  Allergies  Allergen Reactions  . Fentanyl Hives    Medications Prior to Admission  Medication Sig Dispense Refill Last Dose  . ferrous sulfate 325 (65 FE) MG EC tablet Take 1 tablet (325 mg total) by mouth every other day. 30 tablet 2 Past Week at Unknown time  . hydrOXYzine (ATARAX/VISTARIL) 25 MG tablet Take 2 tablets (50 mg total) by mouth every 8 (eight) hours as needed for anxiety. 60 tablet 3 04/07/2021 at Unknown time  . ondansetron (ZOFRAN) 4 MG tablet Take 1 tablet (4 mg total) by mouth every 8 (eight) hours as needed for nausea or vomiting. 20 tablet 0 04/07/2021 at Unknown time  . pantoprazole (PROTONIX) 20 MG tablet Take 1 tablet (20 mg total) by mouth 2 (two) times daily before a meal. 60  tablet 3 04/07/2021 at Unknown time    Review of Systems  Constitutional: Negative.   Eyes: Negative.   Gastrointestinal: Negative.   Genitourinary: Negative.   Neurological: Positive for dizziness and headaches.   Physical Exam   Blood pressure 132/83, pulse 83, temperature 98.1 F (36.7 C), temperature source Oral, resp. rate 20, last menstrual period 08/08/2020, SpO2 99 %.  Physical Exam Vitals and nursing note reviewed.  Constitutional:      General: She is not in acute distress.    Appearance: She is not ill-appearing or diaphoretic.  HENT:     Head: Normocephalic.  Eyes:     General: No scleral icterus. Cardiovascular:     Rate and Rhythm: Normal rate and regular rhythm.     Heart sounds: Normal heart sounds.  Pulmonary:     Effort: Pulmonary effort is normal. No respiratory distress.     Breath sounds: Normal breath sounds.  Abdominal:     Tenderness: There is no abdominal tenderness.  Neurological:     Mental Status: She is easily aroused. She is  lethargic.     Deep Tendon Reflexes: Reflexes normal.  Psychiatric:        Behavior: Behavior is slowed. Behavior is not agitated. Behavior is cooperative.        Thought Content: Thought content normal.    NST:  Baseline: 125 bpm, Variability: Good {> 6 bpm), Accelerations: Reactive and Decelerations: Absent  MAU Course  Procedures Results for orders placed or performed during the hospital encounter of 04/07/21 (from the past 24 hour(s))  CBC     Status: Abnormal   Collection Time: 04/07/21 12:40 PM  Result Value Ref Range   WBC 10.9 (H) 4.0 - 10.5 K/uL   RBC 3.79 (L) 3.87 - 5.11 MIL/uL   Hemoglobin 7.9 (L) 12.0 - 15.0 g/dL   HCT 14.9 (L) 70.2 - 63.7 %   MCV 71.2 (L) 80.0 - 100.0 fL   MCH 20.8 (L) 26.0 - 34.0 pg   MCHC 29.3 (L) 30.0 - 36.0 g/dL   RDW 85.8 (H) 85.0 - 27.7 %   Platelets 297 150 - 400 K/uL   nRBC 0.0 0.0 - 0.2 %  Comprehensive metabolic panel     Status: Abnormal   Collection Time: 04/07/21 12:40 PM  Result Value Ref Range   Sodium 134 (L) 135 - 145 mmol/L   Potassium 3.3 (L) 3.5 - 5.1 mmol/L   Chloride 104 98 - 111 mmol/L   CO2 24 22 - 32 mmol/L   Glucose, Bld 67 (L) 70 - 99 mg/dL   BUN 6 6 - 20 mg/dL   Creatinine, Ser 4.12 0.44 - 1.00 mg/dL   Calcium 8.7 (L) 8.9 - 10.3 mg/dL   Total Protein 6.1 (L) 6.5 - 8.1 g/dL   Albumin 2.5 (L) 3.5 - 5.0 g/dL   AST 16 15 - 41 U/L   ALT 10 0 - 44 U/L   Alkaline Phosphatase 136 (H) 38 - 126 U/L   Total Bilirubin 0.3 0.3 - 1.2 mg/dL   GFR, Estimated >87 >86 mL/min   Anion gap 6 5 - 15  Protein / creatinine ratio, urine     Status: None   Collection Time: 04/07/21  1:41 PM  Result Value Ref Range   Creatinine, Urine 127.51 mg/dL   Total Protein, Urine 18 mg/dL   Protein Creatinine Ratio 0.14 0.00 - 0.15 mg/mg[Cre]   No results found.  MDM Patient has reactive fetal tracing in MAU. BPP  performed & is 8/8 with normal AFI.   Elevated BPs in MAU. Per chart review patient has already been diagnosed with preeclampsia.  She is currently asymptomatic. Preeclampsia labs today are reassuring. Had 1 severe range BP.   Irregular contractions on the monitor. Pt describes as tightening, not painful.  Was planning to check patient's cervix but she walked out AMA while her preeclampsia labs were pending. Staff was unaware that patient was gone until it was noticed that she was no longer on fetal monitor & her room was empty.   Assessment and Plan   1. Pre-eclampsia in third trimester   2. 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 [redacted] weeks gestation of pregnancy   4. Left against medical advice      Judeth Horn 04/07/2021, 12:14 PM

## 2021-04-07 NOTE — Progress Notes (Signed)
Pt presents stating she was sent to hospital by MD last week, but couldn't stay to be evaluated d/t issues with her 28 year old, she is now here for evaluation.  States she's here for fetal monitoring secondary a change in Methadone dosing.  She also reports increase in ctxs, irregular.  Denies VB or LOF.  Endorses +FM.

## 2021-04-07 NOTE — Progress Notes (Addendum)
Pt left AMA prior to lab results and  being discharged. Seen  ambulating off unit accompanied with significant other per Webb Silversmith, NT.

## 2021-04-08 ENCOUNTER — Other Ambulatory Visit: Payer: Self-pay

## 2021-04-08 ENCOUNTER — Encounter (HOSPITAL_COMMUNITY): Payer: Self-pay | Admitting: Obstetrics and Gynecology

## 2021-04-08 ENCOUNTER — Inpatient Hospital Stay (HOSPITAL_COMMUNITY)
Admission: AD | Admit: 2021-04-08 | Discharge: 2021-04-08 | Disposition: A | Discharge status: Home / Self Care | Payer: Medicaid Other | Attending: Obstetrics and Gynecology | Admitting: Obstetrics and Gynecology

## 2021-04-08 DIAGNOSIS — Z3A34 34 weeks gestation of pregnancy: Secondary | ICD-10-CM

## 2021-04-08 DIAGNOSIS — O99343 Other mental disorders complicating pregnancy, third trimester: Secondary | ICD-10-CM | POA: Insufficient documentation

## 2021-04-08 DIAGNOSIS — F17210 Nicotine dependence, cigarettes, uncomplicated: Secondary | ICD-10-CM | POA: Diagnosis not present

## 2021-04-08 DIAGNOSIS — O99323 Drug use complicating pregnancy, third trimester: Secondary | ICD-10-CM | POA: Diagnosis not present

## 2021-04-08 DIAGNOSIS — F1290 Cannabis use, unspecified, uncomplicated: Secondary | ICD-10-CM

## 2021-04-08 DIAGNOSIS — F319 Bipolar disorder, unspecified: Secondary | ICD-10-CM | POA: Diagnosis not present

## 2021-04-08 DIAGNOSIS — O1493 Unspecified pre-eclampsia, third trimester: Secondary | ICD-10-CM | POA: Diagnosis not present

## 2021-04-08 DIAGNOSIS — O99333 Smoking (tobacco) complicating pregnancy, third trimester: Secondary | ICD-10-CM | POA: Insufficient documentation

## 2021-04-08 DIAGNOSIS — O0990 Supervision of high risk pregnancy, unspecified, unspecified trimester: Secondary | ICD-10-CM

## 2021-04-08 NOTE — MAU Note (Signed)
Meghan Bullock is a 30 y.o. at [redacted]w[redacted]d here in MAU reporting: contractions for the past 4 days. States they are getting worse. States they are often. States she is having some clear leaking intermittently for the past month. No bleeding. +FM  Onset of complaint: ongoing  Pain score: 6/10  Vitals:   04/08/21 0958  BP: 128/76  Pulse: 79  Resp: 18  Temp: 98.3 F (36.8 C)  SpO2: 97%     FHT: +FM  Lab orders placed from triage: UA

## 2021-04-08 NOTE — Discharge Instructions (Signed)

## 2021-04-08 NOTE — MAU Note (Signed)
PT tried to leave without paperwork. She signed papers in hallway on her way out. Discharge instructions given. RN walked her out to her ride.

## 2021-04-08 NOTE — MAU Provider Note (Signed)
History     CSN: [id number]  Arrival date and time: 04/08/21 7510   Event Date/Time   First Provider Initiated Contact with Patient 04/08/21 1044      Chief Complaint  Patient presents with  . Contractions  . Rupture of Membranes   HPI Meghan Bullock is a 30 y.o. [id number] at [redacted]w[redacted]d. Patient presented to MAU immediately following administration of her Suboxone at a new increased dose. On arrival she is extremely somnolent but awakens and is appropriately responsive to questions. She endorses 4 days of contractions on arrival to MAU but denied contractions on CNM initial assessment and during subsequent talks with CNM at bedside. She also reported leaking of fluid to MAU registration, onset one month ago. She denies leaking of fluid to CNM and throughout period of evaluation in MAU.  Patient receives care with [facility].  OB History    Gravida  4   Para  2   Term  1   Preterm  1   AB  1   Living  2     SAB  1   IAB      Ectopic      Multiple  0   Live Births  2            Past Medical History:  Diagnosis Date  . Anemia   . Anxiety   . Bipolar 1 disorder (HCC)   . Chlamydia   . Hepatitis C   . Heroin abuse (HCC)   . Mononucleosis   . Ovarian cyst   . Polysubstance abuse (HCC)   . Preterm premature rupture of membranes (PPROM) with unknown onset of labor 05/12/2014    Past Surgical History:  Procedure Laterality Date  . HAND SURGERY    . I & D EXTREMITY Left 09/16/2019   Procedure: IRRIGATION AND DEBRIDEMENT EXTREMITY;  Surgeon: Ernest Mallick, MD;  Location: [facility] OR;  Service: Orthopedics;  Laterality: Left;    Family History  Problem Relation Age of Onset  . COPD Mother   . Anxiety disorder Mother   . Emphysema Mother     Social History   Tobacco Use  . Smoking status: Current Every Day Smoker    Packs/day: 1.50    Years: 5.00    Pack years: 7.50    Types: Cigarettes  . Smokeless tobacco: Never Used  Vaping Use  . Vaping Use:  Never used  Substance Use Topics  . Alcohol use: No  . Drug use: Yes    Types: Heroin, Marijuana    Comment: fentanyl-heroin 09/29/2020    Allergies:  Allergies  Allergen Reactions  . Fentanyl Hives    Medications Prior to Admission  Medication Sig Dispense Refill Last Dose  . hydrOXYzine (ATARAX/VISTARIL) 25 MG tablet Take 2 tablets (50 mg total) by mouth every 8 (eight) hours as needed for anxiety. 60 tablet 3 04/07/2021 at Unknown time  . ondansetron (ZOFRAN) 4 MG tablet Take 1 tablet (4 mg total) by mouth every 8 (eight) hours as needed for nausea or vomiting. 20 tablet 0 04/08/2021 at Unknown time  . ferrous sulfate 325 (65 FE) MG EC tablet Take 1 tablet (325 mg total) by mouth every other day. 30 tablet 2   . pantoprazole (PROTONIX) 20 MG tablet Take 1 tablet (20 mg total) by mouth 2 (two) times daily before a meal. 60 tablet 3     Review of Systems  Gastrointestinal: Negative for abdominal pain.  Genitourinary: Negative for vaginal discharge.  All other systems reviewed and are negative.  Physical Exam   Blood pressure 127/78, pulse 75, temperature 98.3 F (36.8 C), temperature source Oral, resp. rate 18, last menstrual period 08/08/2020, SpO2 99 %.  Physical Exam Vitals and nursing note reviewed. Exam conducted with a chaperone present.  Cardiovascular:     Rate and Rhythm: Normal rate.     Pulses: Normal pulses.     Heart sounds: Normal heart sounds.  Pulmonary:     Effort: Pulmonary effort is normal.     Breath sounds: Normal breath sounds.  Abdominal:     Comments: Gravid  Skin:    Capillary Refill: Capillary refill takes less than 2 seconds.  Neurological:     Mental Status: She is alert.     MAU Course  Procedures  --Reactive tracing: baseline 125, mod var, + accels, no decels --Toco: ctx x 1, otherwise quiet --Patient denied pain, LOF to CNM throughout appointment --Patient declined pelvic exam, cervical exam, Amnisure --Patient called out at 1153,  requesting immediate discharge  Patient Vitals for the past 24 hrs:  BP Temp Temp src Pulse Resp SpO2  04/08/21 1202 -- -- -- 75 16 --  04/08/21 1137 127/78 -- -- 75 -- --  04/08/21 1135 -- -- -- -- -- 99 %  04/08/21 1115 -- -- -- -- -- 98 %  04/08/21 1110 -- -- -- -- -- 97 %  04/08/21 1105 -- -- -- -- -- 97 %  04/08/21 1100 -- -- -- -- -- 96 %  04/08/21 1055 -- -- -- -- -- 97 %  04/08/21 1035 -- -- -- -- -- 99 %  04/08/21 1030 -- -- -- -- -- 98 %  04/08/21 1020 134/83 -- -- 83 -- 99 %  04/08/21 0958 128/76 98.3 F (36.8 C) Oral 79 18 97 %    Assessment and Plan  --30 y.o. [id number] at [redacted]w[redacted]d  --Previously diagnosed PEC without severe features --Reactive tracing --Denied complaints once roomed in MAU --Discharge home in stable condition  F/U: --MFM appointment 04/09/2021 --[facility] appointment 04/13/2021   Calvert Cantor, CNM 04/08/2021, 3:25 PM

## 2021-04-09 ENCOUNTER — Ambulatory Visit: Payer: Medicaid Other

## 2021-04-09 ENCOUNTER — Ambulatory Visit: Payer: Medicaid Other | Attending: Family Medicine

## 2021-04-13 ENCOUNTER — Ambulatory Visit (INDEPENDENT_AMBULATORY_CARE_PROVIDER_SITE_OTHER): Payer: Medicaid Other | Admitting: Family Medicine

## 2021-04-13 ENCOUNTER — Encounter: Payer: Self-pay | Admitting: Family Medicine

## 2021-04-13 ENCOUNTER — Telehealth (HOSPITAL_COMMUNITY): Payer: Self-pay | Admitting: *Deleted

## 2021-04-13 ENCOUNTER — Encounter (HOSPITAL_COMMUNITY): Payer: Self-pay

## 2021-04-13 ENCOUNTER — Other Ambulatory Visit (HOSPITAL_COMMUNITY)
Admission: RE | Admit: 2021-04-13 | Discharge: 2021-04-13 | Disposition: A | Discharge status: Home / Self Care | Payer: Medicaid Other | Source: Ambulatory Visit | Attending: Family Medicine | Admitting: Family Medicine

## 2021-04-13 ENCOUNTER — Other Ambulatory Visit: Payer: Self-pay

## 2021-04-13 VITALS — BP 134/100 | HR 63 | Wt 177.0 lb

## 2021-04-13 DIAGNOSIS — O1403 Mild to moderate pre-eclampsia, third trimester: Secondary | ICD-10-CM

## 2021-04-13 DIAGNOSIS — B1920 Unspecified viral hepatitis C without hepatic coma: Secondary | ICD-10-CM

## 2021-04-13 DIAGNOSIS — F1990 Other psychoactive substance use, unspecified, uncomplicated: Secondary | ICD-10-CM

## 2021-04-13 DIAGNOSIS — O09892 Supervision of other high risk pregnancies, second trimester: Secondary | ICD-10-CM

## 2021-04-13 DIAGNOSIS — F419 Anxiety disorder, unspecified: Secondary | ICD-10-CM

## 2021-04-13 DIAGNOSIS — D509 Iron deficiency anemia, unspecified: Secondary | ICD-10-CM

## 2021-04-13 DIAGNOSIS — O0990 Supervision of high risk pregnancy, unspecified, unspecified trimester: Secondary | ICD-10-CM

## 2021-04-13 MED ORDER — BLOOD PRESSURE KIT DEVI: 1.0000 | 0 refills | Status: DC

## 2021-04-13 NOTE — Progress Notes (Signed)
   Subjective:  Meghan Bullock is a 30 y.o. [id number] at [redacted]w[redacted]d being seen today for ongoing prenatal care.  She is currently monitored for the following issues for this high-risk pregnancy and has Heroin abuse (HCC); Encephalopathy, toxic; Benzodiazepine abuse (HCC); Hepatitis C; Substance abuse affecting pregnancy, antepartum; History of preterm delivery, currently pregnant in second trimester; Iron deficiency anemia; Cellulitis and abscess of hand; 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HCC); Abscess of wrist; Substance use disorder; Supervision of high risk pregnancy, antepartum; Pre-eclampsia, mild, antepartum, third trimester; Anxiety; Malnutrition of moderate degree; Opioid use disorder, severe, dependence (HCC); [redacted] weeks gestation of pregnancy; and 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  Patient reports intermittent contractions.  Contractions: Irritability. Vag. Bleeding: None.  Movement: Present. Denies leaking of fluid.   The following portions of the patient's history were reviewed and updated as appropriate: allergies, current medications, past family history, past medical history, past social history, past surgical history and problem list. Problem list updated.  Objective:   Vitals:   04/13/21 0912  BP: (!) 134/100  Pulse: 63  Weight: 177 lb (80.3 kg)    Fetal Status: Fetal Heart Rate (bpm): 128   Movement: Present     General:  Alert, oriented and cooperative. Patient is in no acute distress.  Skin: Skin is warm and dry. No rash noted.   Cardiovascular: Normal heart rate noted  Respiratory: Normal respiratory effort, no problems with respiration noted  Abdomen: Soft, gravid, appropriate for gestational age. Pain/Pressure: Present     Pelvic: Vag. Bleeding: None     Cervical exam deferred        Extremities: Normal range of motion.  Edema: Moderate pitting, indentation subsides rapidly  Mental Status: Normal mood and affect. Normal  behavior. Normal judgment and thought content.   Urinalysis:      Assessment and Plan:  Pregnancy: [id number] at [redacted]w[redacted]d  1. Supervision of high risk pregnancy, antepartum BP and FHR normal Swabs collected today given plan for 37wk IOL  2. Substance use disorder On 130mg  daily, doing well  3. Pre-eclampsia, mild, antepartum, third trimester BP mild range today, some swelling on exam, otherwise asymptomatic Induction form faxed, orders placed for 37 wk IOL Rescheduled for growth and BPP tomorrow w MFM BP cuff [facility] as none available in clinic  4. Hepatitis C virus infection without hepatic coma, unspecified chronicity Neg viral load  5. History of preterm delivery, currently pregnant in second trimester   6. Iron deficiency anemia, unspecified iron deficiency anemia type Too little time for feraheme to be scheduled before delivery, will give in hospital at time of admission  7. Anxiety stable  Preterm labor symptoms and general obstetric precautions including but not limited to vaginal bleeding, contractions, leaking of fluid and fetal movement were reviewed in detail with the patient. Please refer to After Visit Summary for other counseling recommendations.  Return in 1 week (on 04/20/2021) for [facility], ob visit, needs MD.   [facility], MD

## 2021-04-13 NOTE — Patient Instructions (Signed)
 Contraception Choices Contraception, also called birth control, refers to methods or devices that prevent pregnancy. Hormonal methods Contraceptive implant A contraceptive implant is a thin, plastic tube that contains a hormone that prevents pregnancy. It is different from an intrauterine device (IUD). It is inserted into the upper part of the arm by a health care provider. Implants can be effective for up to 3 years. Progestin-only injections Progestin-only injections are injections of progestin, a synthetic form of the hormone progesterone. They are given every 3 months by a health care provider. Birth control pills Birth control pills are pills that contain hormones that prevent pregnancy. They must be taken once a day, preferably at the same time each day. A prescription is needed to use this method of contraception. Birth control patch The birth control patch contains hormones that prevent pregnancy. It is placed on the skin and must be changed once a week for three weeks and removed on the fourth week. A prescription is needed to use this method of contraception. Vaginal ring A vaginal ring contains hormones that prevent pregnancy. It is placed in the vagina for three weeks and removed on the fourth week. After that, the process is repeated with a new ring. A prescription is needed to use this method of contraception. Emergency contraceptive Emergency contraceptives prevent pregnancy after unprotected sex. They come in pill form and can be taken up to 5 days after sex. They work best the sooner they are taken after having sex. Most emergency contraceptives are available without a prescription. This method should not be used as your only form of birth control.   Barrier methods Female condom A female condom is a thin sheath that is worn over the penis during sex. Condoms keep sperm from going inside a woman's body. They can be used with a sperm-killing substance (spermicide) to increase their  effectiveness. They should be thrown away after one use. Female condom A female condom is a soft, loose-fitting sheath that is put into the vagina before sex. The condom keeps sperm from going inside a woman's body. They should be thrown away after one use. Diaphragm A diaphragm is a soft, dome-shaped barrier. It is inserted into the vagina before sex, along with a spermicide. The diaphragm blocks sperm from entering the uterus, and the spermicide kills sperm. A diaphragm should be left in the vagina for 6-8 hours after sex and removed within 24 hours. A diaphragm is prescribed and fitted by a health care provider. A diaphragm should be replaced every 1-2 years, after giving birth, after gaining more than 15 lb (6.8 kg), and after pelvic surgery. Cervical cap A cervical cap is a round, soft latex or plastic cup that fits over the cervix. It is inserted into the vagina before sex, along with spermicide. It blocks sperm from entering the uterus. The cap should be left in place for 6-8 hours after sex and removed within 48 hours. A cervical cap must be prescribed and fitted by a health care provider. It should be replaced every 2 years. Sponge A sponge is a soft, circular piece of polyurethane foam with spermicide in it. The sponge helps block sperm from entering the uterus, and the spermicide kills sperm. To use it, you make it wet and then insert it into the vagina. It should be inserted before sex, left in for at least 6 hours after sex, and removed and thrown away within 30 hours. Spermicides Spermicides are chemicals that kill or block sperm from entering the   cervix and uterus. They can come as a cream, jelly, suppository, foam, or tablet. A spermicide should be inserted into the vagina with an applicator at least 10-15 minutes before sex to allow time for it to work. The process must be repeated every time you have sex. Spermicides do not require a prescription.   Intrauterine  contraception Intrauterine device (IUD) An IUD is a T-shaped device that is put in a woman's uterus. There are two types:  Hormone IUD.This type contains progestin, a synthetic form of the hormone progesterone. This type can stay in place for 3-5 years.  Copper IUD.This type is wrapped in copper wire. It can stay in place for 10 years. Permanent methods of contraception Female tubal ligation In this method, a woman's fallopian tubes are sealed, tied, or blocked during surgery to prevent eggs from traveling to the uterus. Hysteroscopic sterilization In this method, a small, flexible insert is placed into each fallopian tube. The inserts cause scar tissue to form in the fallopian tubes and block them, so sperm cannot reach an egg. The procedure takes about 3 months to be effective. Another form of birth control must be used during those 3 months. Female sterilization This is a procedure to tie off the tubes that carry sperm (vasectomy). After the procedure, the man can still ejaculate fluid (semen). Another form of birth control must be used for 3 months after the procedure. Natural planning methods Natural family planning In this method, a couple does not have sex on days when the woman could become pregnant. Calendar method In this method, the woman keeps track of the length of each menstrual cycle, identifies the days when pregnancy can happen, and does not have sex on those days. Ovulation method In this method, a couple avoids sex during ovulation. Symptothermal method This method involves not having sex during ovulation. The woman typically checks for ovulation by watching changes in her temperature and in the consistency of cervical mucus. Post-ovulation method In this method, a couple waits to have sex until after ovulation. Where to find more information  Centers for Disease Control and Prevention: www.cdc.gov Summary  Contraception, also called birth control, refers to methods or  devices that prevent pregnancy.  Hormonal methods of contraception include implants, injections, pills, patches, vaginal rings, and emergency contraceptives.  Barrier methods of contraception can include female condoms, female condoms, diaphragms, cervical caps, sponges, and spermicides.  There are two types of IUDs (intrauterine devices). An IUD can be put in a woman's uterus to prevent pregnancy for 3-5 years.  Permanent sterilization can be done through a procedure for males and females. Natural family planning methods involve nothaving sex on days when the woman could become pregnant. This information is not intended to replace advice given to you by your health care provider. Make sure you discuss any questions you have with your health care provider. Document Revised: 04/20/2020 Document Reviewed: 04/20/2020 Elsevier Patient Education  2021 Else[employer] Inc.   Breastfeeding  Choosing to breastfeed is one of the best decisions you can make for yourself and your baby. A change in hormones during pregnancy causes your breasts to make breast milk in your milk-producing glands. Hormones prevent breast milk from being released before your baby is born. They also prompt milk flow after birth. Once breastfeeding has begun, thoughts of your baby, as well as his or her sucking or crying, can stimulate the release of milk from your milk-producing glands. Benefits of breastfeeding Research shows that breastfeeding offers many health benefits   for infants and mothers. It also offers a cost-free and convenient way to feed your baby. For your baby  Your first milk (colostrum) helps your baby's digestive system to function better.  Special cells in your milk (antibodies) help your baby to fight off infections.  Breastfed babies are less likely to develop asthma, allergies, obesity, or type 2 diabetes. They are also at lower risk for sudden infant death syndrome (SIDS).  Nutrients in breast milk are better  able to meet your baby's needs compared to infant formula.  Breast milk improves your baby's brain development. For you  Breastfeeding helps to create a very special bond between you and your baby.  Breastfeeding is convenient. Breast milk costs nothing and is always available at the correct temperature.  Breastfeeding helps to burn calories. It helps you to lose the weight that you gained during pregnancy.  Breastfeeding makes your uterus return faster to its size before pregnancy. It also slows bleeding (lochia) after you give birth.  Breastfeeding helps to lower your risk of developing type 2 diabetes, osteoporosis, rheumatoid arthritis, cardiovascular disease, and breast, ovarian, uterine, and endometrial cancer later in life. Breastfeeding basics Starting breastfeeding  Find a comfortable place to sit or lie down, with your neck and back well-supported.  Place a pillow or a rolled-up blanket under your baby to bring him or her to the level of your breast (if you are seated). Nursing pillows are specially designed to help support your arms and your baby while you breastfeed.  Make sure that your baby's tummy (abdomen) is facing your abdomen.  Gently massage your breast. With your fingertips, massage from the outer edges of your breast inward toward the nipple. This encourages milk flow. If your milk flows slowly, you may need to continue this action during the feeding.  Support your breast with 4 fingers underneath and your thumb above your nipple (make the letter "C" with your hand). Make sure your fingers are well away from your nipple and your baby's mouth.  Stroke your baby's lips gently with your finger or nipple.  When your baby's mouth is open wide enough, quickly bring your baby to your breast, placing your entire nipple and as much of the areola as possible into your baby's mouth. The areola is the colored area around your nipple. ? More areola should be visible above your  baby's upper lip than below the lower lip. ? Your baby's lips should be opened and extended outward (flanged) to ensure an adequate, comfortable latch. ? Your baby's tongue should be between his or her lower gum and your breast.  Make sure that your baby's mouth is correctly positioned around your nipple (latched). Your baby's lips should create a seal on your breast and be turned out (everted).  It is common for your baby to suck about 2-3 minutes in order to start the flow of breast milk. Latching Teaching your baby how to latch onto your breast properly is very important. An improper latch can cause nipple pain, decreased milk supply, and poor weight gain in your baby. Also, if your baby is not latched onto your nipple properly, he or she may swallow some air during feeding. This can make your baby fussy. Burping your baby when you switch breasts during the feeding can help to get rid of the air. However, teaching your baby to latch on properly is still the best way to prevent fussiness from swallowing air while breastfeeding. Signs that your baby has successfully latched onto   your nipple  Silent tugging or silent sucking, without causing you pain. Infant's lips should be extended outward (flanged).  Swallowing heard between every 3-4 sucks once your milk has started to flow (after your let-down milk reflex occurs).  Muscle movement above and in front of his or her ears while sucking. Signs that your baby has not successfully latched onto your nipple  Sucking sounds or smacking sounds from your baby while breastfeeding.  Nipple pain. If you think your baby has not latched on correctly, slip your finger into the corner of your baby's mouth to break the suction and place it between your baby's gums. Attempt to start breastfeeding again. Signs of successful breastfeeding Signs from your baby  Your baby will gradually decrease the number of sucks or will completely stop sucking.  Your baby  will fall asleep.  Your baby's body will relax.  Your baby will retain a small amount of milk in his or her mouth.  Your baby will let go of your breast by himself or herself. Signs from you  Breasts that have increased in firmness, weight, and size 1-3 hours after feeding.  Breasts that are softer immediately after breastfeeding.  Increased milk volume, as well as a change in milk consistency and color by the fifth day of breastfeeding.  Nipples that are not sore, cracked, or bleeding. Signs that your baby is getting enough milk  Wetting at least 1-2 diapers during the first 24 hours after birth.  Wetting at least 5-6 diapers every 24 hours for the first week after birth. The urine should be clear or pale yellow by the age of 5 days.  Wetting 6-8 diapers every 24 hours as your baby continues to grow and develop.  At least 3 stools in a 24-hour period by the age of 5 days. The stool should be soft and yellow.  At least 3 stools in a 24-hour period by the age of 7 days. The stool should be seedy and yellow.  No loss of weight greater than 10% of birth weight during the first 3 days of life.  Average weight gain of 4-7 oz (113-198 g) per week after the age of 4 days.  Consistent daily weight gain by the age of 5 days, without weight loss after the age of 2 weeks. After a feeding, your baby may spit up a small amount of milk. This is normal. Breastfeeding frequency and duration Frequent feeding will help you make more milk and can prevent sore nipples and extremely full breasts (breast engorgement). Breastfeed when you feel the need to reduce the fullness of your breasts or when your baby shows signs of hunger. This is called "breastfeeding on demand." Signs that your baby is hungry include:  Increased alertness, activity, or restlessness.  Movement of the head from side to side.  Opening of the mouth when the corner of the mouth or cheek is stroked (rooting).  Increased  sucking sounds, smacking lips, cooing, sighing, or squeaking.  Hand-to-mouth movements and sucking on fingers or hands.  Fussing or crying. Avoid introducing a pacifier to your baby in the first 4-6 weeks after your baby is born. After this time, you may choose to use a pacifier. Research has shown that pacifier use during the first year of a baby's life decreases the risk of sudden infant death syndrome (SIDS). Allow your baby to feed on each breast as long as he or she wants. When your baby unlatches or falls asleep while feeding from the   first breast, offer the second breast. Because newborns are often sleepy in the first few weeks of life, you may need to awaken your baby to get him or her to feed. Breastfeeding times will vary from baby to baby. However, the following rules can serve as a guide to help you make sure that your baby is properly fed:  Newborns (babies 4 weeks of age or younger) may breastfeed every 1-3 hours.  Newborns should not go without breastfeeding for longer than 3 hours during the day or 5 hours during the night.  You should breastfeed your baby a minimum of 8 times in a 24-hour period. Breast milk pumping Pumping and storing breast milk allows you to make sure that your baby is exclusively fed your breast milk, even at times when you are unable to breastfeed. This is especially important if you go back to work while you are still breastfeeding, or if you are not able to be present during feedings. Your lactation consultant can help you find a method of pumping that works best for you and give you guidelines about how long it is safe to store breast milk.      Caring for your breasts while you breastfeed Nipples can become dry, cracked, and sore while breastfeeding. The following recommendations can help keep your breasts moisturized and healthy:  Avoid using soap on your nipples.  Wear a supportive bra designed especially for nursing. Avoid wearing underwire-style  bras or extremely tight bras (sports bras).  Air-dry your nipples for 3-4 minutes after each feeding.  Use only cotton bra pads to absorb leaked breast milk. Leaking of breast milk between feedings is normal.  Use lanolin on your nipples after breastfeeding. Lanolin helps to maintain your skin's normal moisture barrier. Pure lanolin is not harmful (not toxic) to your baby. You may also hand express a few drops of breast milk and gently massage that milk into your nipples and allow the milk to air-dry. In the first few weeks after giving birth, some women experience breast engorgement. Engorgement can make your breasts feel heavy, warm, and tender to the touch. Engorgement peaks within 3-5 days after you give birth. The following recommendations can help to ease engorgement:  Completely empty your breasts while breastfeeding or pumping. You may want to start by applying warm, moist heat (in the shower or with warm, water-soaked hand towels) just before feeding or pumping. This increases circulation and helps the milk flow. If your baby does not completely empty your breasts while breastfeeding, pump any extra milk after he or she is finished.  Apply ice packs to your breasts immediately after breastfeeding or pumping, unless this is too uncomfortable for you. To do this: ? Put ice in a plastic bag. ? Place a towel between your skin and the bag. ? Leave the ice on for 20 minutes, 2-3 times a day.  Make sure that your baby is latched on and positioned properly while breastfeeding. If engorgement persists after 48 hours of following these recommendations, contact your health care provider or a lactation consultant. Overall health care recommendations while breastfeeding  Eat 3 healthy meals and 3 snacks every day. Well-nourished mothers who are breastfeeding need an additional 450-500 calories a day. You can meet this requirement by increasing the amount of a balanced diet that you eat.  Drink  enough water to keep your urine pale yellow or clear.  Rest often, relax, and continue to take your prenatal vitamins to prevent fatigue, stress, and low   vitamin and mineral levels in your body (nutrient deficiencies).  Do not use any products that contain nicotine or tobacco, such as cigarettes and e-cigarettes. Your baby may be harmed by chemicals from cigarettes that pass into breast milk and exposure to secondhand smoke. If you need help quitting, ask your health care provider.  Avoid alcohol.  Do not use illegal drugs or marijuana.  Talk with your health care provider before taking any medicines. These include over-the-counter and prescription medicines as well as vitamins and herbal supplements. Some medicines that may be harmful to your baby can pass through breast milk.  It is possible to become pregnant while breastfeeding. If birth control is desired, ask your health care provider about options that will be safe while breastfeeding your baby. Where to find more information: [employer]: www.llli.org Contact a health care provider if:  You feel like you want to stop breastfeeding or have become frustrated with breastfeeding.  Your nipples are cracked or bleeding.  Your breasts are red, tender, or warm.  You have: ? Painful breasts or nipples. ? A swollen area on either breast. ? A fever or chills. ? Nausea or vomiting. ? Drainage other than breast milk from your nipples.  Your breasts do not become full before feedings by the fifth day after you give birth.  You feel sad and depressed.  Your baby is: ? Too sleepy to eat well. ? Having trouble sleeping. ? More than 1 week old and wetting fewer than 6 diapers in a 24-hour period. ? Not gaining weight by 5 days of age.  Your baby has fewer than 3 stools in a 24-hour period.  Your baby's skin or the white parts of his or her eyes become yellow. Get help right away if:  Your baby is overly tired  (lethargic) and does not want to wake up and feed.  Your baby develops an unexplained fever. Summary  Breastfeeding offers many health benefits for infant and mothers.  Try to breastfeed your infant when he or she shows early signs of hunger.  Gently tickle or stroke your baby's lips with your finger or nipple to allow the baby to open his or her mouth. Bring the baby to your breast. Make sure that much of the areola is in your baby's mouth. Offer one side and burp the baby before you offer the other side.  Talk with your health care provider or lactation consultant if you have questions or you face problems as you breastfeed. This information is not intended to replace advice given to you by your health care provider. Make sure you discuss any questions you have with your health care provider. Document Revised: 02/08/2018 Document Reviewed: 12/16/2016 Elsevier Patient Education  2021 Elsevier Inc.  

## 2021-04-13 NOTE — Addendum Note (Signed)
Addended by: Merian Capron on: 04/13/2021 10:16 AM   Modules accepted: Orders

## 2021-04-13 NOTE — Telephone Encounter (Signed)
Preadmission screen  

## 2021-04-14 ENCOUNTER — Ambulatory Visit: Payer: Medicaid Other | Attending: Family Medicine

## 2021-04-14 ENCOUNTER — Ambulatory Visit: Payer: Medicaid Other

## 2021-04-14 ENCOUNTER — Other Ambulatory Visit: Payer: Self-pay | Admitting: Advanced Practice Midwife

## 2021-04-14 ENCOUNTER — Telehealth (HOSPITAL_COMMUNITY): Payer: Self-pay | Admitting: *Deleted

## 2021-04-14 LAB — CERVICOVAGINAL ANCILLARY ONLY
Chlamydia: NEGATIVE
Comment: NEGATIVE
Comment: NEGATIVE
Comment: NORMAL
Neisseria Gonorrhea: NEGATIVE
Trichomonas: NEGATIVE

## 2021-04-14 NOTE — Telephone Encounter (Signed)
Preadmission screen  

## 2021-04-15 ENCOUNTER — Encounter (HOSPITAL_COMMUNITY): Payer: Self-pay | Admitting: *Deleted

## 2021-04-17 LAB — CULTURE, BETA STREP (GROUP B ONLY): Strep Gp B Culture: NEGATIVE

## 2021-04-20 ENCOUNTER — Inpatient Hospital Stay (HOSPITAL_COMMUNITY)
Admission: AD | Admit: 2021-04-20 | Discharge: 2021-04-23 | DRG: 806 | Disposition: A | Discharge status: Home / Self Care | Payer: Medicaid Other | Attending: Obstetrics and Gynecology | Admitting: Obstetrics and Gynecology

## 2021-04-20 ENCOUNTER — Inpatient Hospital Stay (HOSPITAL_COMMUNITY): Payer: Medicaid Other | Admitting: Anesthesiology

## 2021-04-20 ENCOUNTER — Encounter: Payer: Self-pay | Admitting: Oncology

## 2021-04-20 ENCOUNTER — Other Ambulatory Visit: Payer: Self-pay

## 2021-04-20 ENCOUNTER — Other Ambulatory Visit: Payer: Medicaid Other

## 2021-04-20 ENCOUNTER — Encounter: Payer: Medicaid Other | Admitting: Family Medicine

## 2021-04-20 ENCOUNTER — Encounter (HOSPITAL_COMMUNITY): Payer: Self-pay | Admitting: Obstetrics & Gynecology

## 2021-04-20 DIAGNOSIS — O9903 Anemia complicating the puerperium: Secondary | ICD-10-CM | POA: Diagnosis present

## 2021-04-20 DIAGNOSIS — O99892 Other specified diseases and conditions complicating childbirth: Secondary | ICD-10-CM | POA: Diagnosis present

## 2021-04-20 DIAGNOSIS — Z8619 Personal history of other infectious and parasitic diseases: Secondary | ICD-10-CM

## 2021-04-20 DIAGNOSIS — F17210 Nicotine dependence, cigarettes, uncomplicated: Secondary | ICD-10-CM | POA: Diagnosis present

## 2021-04-20 DIAGNOSIS — O1414 Severe pre-eclampsia complicating childbirth: Secondary | ICD-10-CM | POA: Diagnosis present

## 2021-04-20 DIAGNOSIS — H5461 Unqualified visual loss, right eye, normal vision left eye: Secondary | ICD-10-CM | POA: Diagnosis present

## 2021-04-20 DIAGNOSIS — Z20822 Contact with and (suspected) exposure to covid-19: Secondary | ICD-10-CM | POA: Diagnosis present

## 2021-04-20 DIAGNOSIS — O9081 Anemia of the puerperium: Secondary | ICD-10-CM | POA: Diagnosis not present

## 2021-04-20 DIAGNOSIS — O1404 Mild to moderate pre-eclampsia, complicating childbirth: Secondary | ICD-10-CM

## 2021-04-20 DIAGNOSIS — B182 Chronic viral hepatitis C: Secondary | ICD-10-CM | POA: Diagnosis present

## 2021-04-20 DIAGNOSIS — O99320 Drug use complicating pregnancy, unspecified trimester: Secondary | ICD-10-CM | POA: Diagnosis present

## 2021-04-20 DIAGNOSIS — O1413 Severe pre-eclampsia, third trimester: Secondary | ICD-10-CM | POA: Diagnosis present

## 2021-04-20 DIAGNOSIS — O26893 Other specified pregnancy related conditions, third trimester: Secondary | ICD-10-CM | POA: Diagnosis present

## 2021-04-20 DIAGNOSIS — O42013 Preterm premature rupture of membranes, onset of labor within 24 hours of rupture, third trimester: Secondary | ICD-10-CM

## 2021-04-20 DIAGNOSIS — O99324 Drug use complicating childbirth: Secondary | ICD-10-CM | POA: Diagnosis present

## 2021-04-20 DIAGNOSIS — F1120 Opioid dependence, uncomplicated: Secondary | ICD-10-CM | POA: Diagnosis present

## 2021-04-20 DIAGNOSIS — D62 Acute posthemorrhagic anemia: Secondary | ICD-10-CM | POA: Diagnosis not present

## 2021-04-20 DIAGNOSIS — O42919 Preterm premature rupture of membranes, unspecified as to length of time between rupture and onset of labor, unspecified trimester: Secondary | ICD-10-CM

## 2021-04-20 DIAGNOSIS — O0990 Supervision of high risk pregnancy, unspecified, unspecified trimester: Secondary | ICD-10-CM

## 2021-04-20 DIAGNOSIS — O99334 Smoking (tobacco) complicating childbirth: Secondary | ICD-10-CM | POA: Diagnosis present

## 2021-04-20 DIAGNOSIS — O42913 Preterm premature rupture of membranes, unspecified as to length of time between rupture and onset of labor, third trimester: Secondary | ICD-10-CM | POA: Diagnosis present

## 2021-04-20 DIAGNOSIS — Z8759 Personal history of other complications of pregnancy, childbirth and the puerperium: Secondary | ICD-10-CM | POA: Diagnosis present

## 2021-04-20 DIAGNOSIS — Z3A36 36 weeks gestation of pregnancy: Secondary | ICD-10-CM

## 2021-04-20 DIAGNOSIS — O9902 Anemia complicating childbirth: Secondary | ICD-10-CM

## 2021-04-20 DIAGNOSIS — D509 Iron deficiency anemia, unspecified: Secondary | ICD-10-CM | POA: Diagnosis present

## 2021-04-20 DIAGNOSIS — O9842 Viral hepatitis complicating childbirth: Secondary | ICD-10-CM | POA: Diagnosis present

## 2021-04-20 DIAGNOSIS — O99323 Drug use complicating pregnancy, third trimester: Secondary | ICD-10-CM | POA: Diagnosis present

## 2021-04-20 LAB — COMPREHENSIVE METABOLIC PANEL
ALT: 9 U/L (ref 0–44)
AST: 12 U/L — ABNORMAL LOW (ref 15–41)
Albumin: 2.1 g/dL — ABNORMAL LOW (ref 3.5–5.0)
Alkaline Phosphatase: 117 U/L (ref 38–126)
Anion gap: 8 (ref 5–15)
BUN: 5 mg/dL — ABNORMAL LOW (ref 6–20)
CO2: 24 mmol/L (ref 22–32)
Calcium: 7.9 mg/dL — ABNORMAL LOW (ref 8.9–10.3)
Chloride: 102 mmol/L (ref 98–111)
Creatinine, Ser: 0.41 mg/dL — ABNORMAL LOW (ref 0.44–1.00)
GFR, Estimated: >60 mL/min (ref >60)
Glucose, Bld: 78 mg/dL (ref 70–99)
Potassium: 3.5 mmol/L (ref 3.5–5.1)
Sodium: 134 mmol/L — ABNORMAL LOW (ref 135–145)
Total Bilirubin: 0.4 mg/dL (ref 0.3–1.2)
Total Protein: 5.5 g/dL — ABNORMAL LOW (ref 6.5–8.1)

## 2021-04-20 LAB — CBC
HCT: 26.7 % — ABNORMAL LOW (ref 36.0–46.0)
Hemoglobin: 7.6 g/dL — ABNORMAL LOW (ref 12.0–15.0)
MCH: 20.2 pg — ABNORMAL LOW (ref 26.0–34.0)
MCHC: 28.5 g/dL — ABNORMAL LOW (ref 30.0–36.0)
MCV: 70.8 fL — ABNORMAL LOW (ref 80.0–100.0)
Platelets: 291 10*3/uL (ref 150–400)
RBC: 3.77 MIL/uL — ABNORMAL LOW (ref 3.87–5.11)
RDW: 17.4 % — ABNORMAL HIGH (ref 11.5–15.5)
WBC: 10.2 10*3/uL (ref 4.0–10.5)
nRBC: 0.2 % (ref 0.0–0.2)

## 2021-04-20 LAB — RAPID URINE DRUG SCREEN, HOSP PERFORMED
Amphetamines: NOT DETECTED
Barbiturates: NOT DETECTED
Benzodiazepines: POSITIVE — AB
Cocaine: NOT DETECTED
Opiates: NOT DETECTED
Tetrahydrocannabinol: POSITIVE — AB

## 2021-04-20 LAB — RESP PANEL BY RT-PCR (FLU A&B, COVID) ARPGX2
Influenza A by PCR: NEGATIVE
Influenza B by PCR: NEGATIVE
SARS Coronavirus 2 by RT PCR: NEGATIVE

## 2021-04-20 LAB — PROTEIN / CREATININE RATIO, URINE
Creatinine, Urine: 226.94 mg/dL
Protein Creatinine Ratio: 0.17 mg/mg{Cre} — ABNORMAL HIGH (ref 0.00–0.15)
Total Protein, Urine: 38 mg/dL

## 2021-04-20 LAB — RPR: RPR Ser Ql: NONREACTIVE

## 2021-04-20 MED ORDER — MAGNESIUM SULFATE 40 GM/1000ML IV SOLN
1.0000 g/h | INTRAVENOUS | Status: DC
  Administered 2021-04-21: 1 g/h via INTRAVENOUS
  Filled 2021-04-20: qty 1000

## 2021-04-20 MED ORDER — OXYTOCIN-SODIUM CHLORIDE 30-0.9 UT/500ML-% IV SOLN: 2.5000 [IU]/h | INTRAVENOUS | Status: DC

## 2021-04-20 MED ORDER — LABETALOL HCL 5 MG/ML IV SOLN
20.0000 mg | INTRAVENOUS | Status: DC | PRN
  Administered 2021-04-20: 20 mg via INTRAVENOUS

## 2021-04-20 MED ORDER — PANTOPRAZOLE SODIUM 20 MG PO TBEC
20.0000 mg | DELAYED_RELEASE_TABLET | Freq: Two times a day (BID) | ORAL | Status: DC
  Administered 2021-04-20: 20 mg via ORAL
  Filled 2021-04-20 (×2): qty 1

## 2021-04-20 MED ORDER — FENTANYL-BUPIVACAINE-NACL 0.5-0.125-0.9 MG/250ML-% EP SOLN
EPIDURAL | Status: DC | PRN
  Administered 2021-04-20: 12 mL/h via EPIDURAL

## 2021-04-20 MED ORDER — PANTOPRAZOLE SODIUM 20 MG PO TBEC
20.0000 mg | DELAYED_RELEASE_TABLET | Freq: Two times a day (BID) | ORAL | Status: DC
  Administered 2021-04-21 – 2021-04-23 (×5): 20 mg via ORAL
  Filled 2021-04-20 (×5): qty 1

## 2021-04-20 MED ORDER — TRANEXAMIC ACID-NACL 1000-0.7 MG/100ML-% IV SOLN
1000.0000 mg | Freq: Once | INTRAVENOUS | Status: AC
  Administered 2021-04-20: 1000 mg via INTRAVENOUS

## 2021-04-20 MED ORDER — MAGNESIUM SULFATE BOLUS VIA INFUSION
4.0000 g | Freq: Once | INTRAVENOUS | Status: AC
  Administered 2021-04-20: 4 g via INTRAVENOUS
  Filled 2021-04-20: qty 1000

## 2021-04-20 MED ORDER — LABETALOL HCL 5 MG/ML IV SOLN: 40.0000 mg | INTRAVENOUS | Status: DC | PRN

## 2021-04-20 MED ORDER — METHADONE HCL 10 MG/ML PO CONC
135.0000 mg | Freq: Every day | ORAL | Status: DC
  Administered 2021-04-20: 135 mg via ORAL
  Filled 2021-04-20: qty 13.5

## 2021-04-20 MED ORDER — WITCH HAZEL-GLYCERIN EX PADS: 1.0000 "application " | MEDICATED_PAD | CUTANEOUS | Status: DC | PRN

## 2021-04-20 MED ORDER — LABETALOL HCL 5 MG/ML IV SOLN
40.0000 mg | INTRAVENOUS | Status: DC | PRN
  Filled 2021-04-20 (×2): qty 8

## 2021-04-20 MED ORDER — MEASLES, MUMPS & RUBELLA VAC IJ SOLR: 0.5000 mL | Freq: Once | INTRAMUSCULAR | Status: DC

## 2021-04-20 MED ORDER — HYDRALAZINE HCL 20 MG/ML IJ SOLN: 10.0000 mg | INTRAMUSCULAR | Status: DC | PRN

## 2021-04-20 MED ORDER — LACTATED RINGERS IV SOLN
500.0000 mL | Freq: Once | INTRAVENOUS | Status: AC
  Administered 2021-04-20: 500 mL via INTRAVENOUS

## 2021-04-20 MED ORDER — FENTANYL CITRATE (PF) 100 MCG/2ML IJ SOLN: 50.0000 ug | INTRAMUSCULAR | Status: DC | PRN

## 2021-04-20 MED ORDER — LABETALOL HCL 5 MG/ML IV SOLN
20.0000 mg | INTRAVENOUS | Status: DC | PRN
  Administered 2021-04-20 – 2021-04-21 (×2): 20 mg via INTRAVENOUS
  Filled 2021-04-20 (×2): qty 4

## 2021-04-20 MED ORDER — METHADONE HCL 5 MG PO TABS: 135.0000 mg | ORAL_TABLET | Freq: Every day | ORAL | Status: DC

## 2021-04-20 MED ORDER — FENTANYL-BUPIVACAINE-NACL 0.5-0.125-0.9 MG/250ML-% EP SOLN: 12.0000 mL/h | EPIDURAL | Status: DC | PRN

## 2021-04-20 MED ORDER — OXYTOCIN-SODIUM CHLORIDE 30-0.9 UT/500ML-% IV SOLN
1.0000 m[IU]/min | INTRAVENOUS | Status: DC
  Administered 2021-04-20: 2 m[IU]/min via INTRAVENOUS
  Filled 2021-04-20: qty 500

## 2021-04-20 MED ORDER — COCONUT OIL OIL: 1.0000 "application " | TOPICAL_OIL | Status: DC | PRN

## 2021-04-20 MED ORDER — PRENATAL MULTIVITAMIN CH
1.0000 | ORAL_TABLET | Freq: Every day | ORAL | Status: DC
  Administered 2021-04-21 – 2021-04-23 (×3): 1 via ORAL
  Filled 2021-04-20 (×3): qty 1

## 2021-04-20 MED ORDER — DIPHENHYDRAMINE HCL 25 MG PO CAPS: 25.0000 mg | ORAL_CAPSULE | Freq: Four times a day (QID) | ORAL | Status: DC | PRN

## 2021-04-20 MED ORDER — FENTANYL-BUPIVACAINE-NACL 0.5-0.125-0.9 MG/250ML-% EP SOLN
EPIDURAL | Status: AC
  Administered 2021-04-20: 12 mL/h via EPIDURAL
  Filled 2021-04-20: qty 250

## 2021-04-20 MED ORDER — FENTANYL-BUPIVACAINE-NACL 0.5-0.125-0.9 MG/250ML-% EP SOLN
12.0000 mL/h | EPIDURAL | Status: DC | PRN
  Filled 2021-04-20: qty 250

## 2021-04-20 MED ORDER — LABETALOL HCL 5 MG/ML IV SOLN: 80.0000 mg | INTRAVENOUS | Status: DC | PRN

## 2021-04-20 MED ORDER — TRANEXAMIC ACID-NACL 1000-0.7 MG/100ML-% IV SOLN
INTRAVENOUS | Status: AC
  Filled 2021-04-20: qty 100

## 2021-04-20 MED ORDER — ACETAMINOPHEN 500 MG PO TABS
1000.0000 mg | ORAL_TABLET | Freq: Three times a day (TID) | ORAL | Status: DC
  Administered 2021-04-20 – 2021-04-21 (×2): 1000 mg via ORAL
  Filled 2021-04-20 (×2): qty 2

## 2021-04-20 MED ORDER — PHENYLEPHRINE 40 MCG/ML (10ML) SYRINGE FOR IV PUSH (FOR BLOOD PRESSURE SUPPORT): 80.0000 ug | PREFILLED_SYRINGE | INTRAVENOUS | Status: DC | PRN

## 2021-04-20 MED ORDER — METHADONE HCL 10 MG/ML PO CONC
135.0000 mg | Freq: Every day | ORAL | Status: DC
  Administered 2021-04-21: 135 mg via ORAL
  Filled 2021-04-20: qty 14
  Filled 2021-04-20: qty 13.5

## 2021-04-20 MED ORDER — OXYTOCIN BOLUS FROM INFUSION
333.0000 mL | Freq: Once | INTRAVENOUS | Status: AC
  Administered 2021-04-20: 333 mL via INTRAVENOUS

## 2021-04-20 MED ORDER — LIDOCAINE-EPINEPHRINE (PF) 1.5 %-1:200000 IJ SOLN
INTRAMUSCULAR | Status: DC | PRN
  Administered 2021-04-20: 3 mL via EPIDURAL

## 2021-04-20 MED ORDER — DOCUSATE SODIUM 100 MG PO CAPS
100.0000 mg | ORAL_CAPSULE | Freq: Two times a day (BID) | ORAL | Status: DC
  Administered 2021-04-20 – 2021-04-23 (×6): 100 mg via ORAL
  Filled 2021-04-20 (×6): qty 1

## 2021-04-20 MED ORDER — LABETALOL HCL 5 MG/ML IV SOLN
20.0000 mg | INTRAVENOUS | Status: DC | PRN
  Administered 2021-04-20: 20 mg via INTRAVENOUS
  Filled 2021-04-20: qty 4

## 2021-04-20 MED ORDER — SENNOSIDES-DOCUSATE SODIUM 8.6-50 MG PO TABS
2.0000 | ORAL_TABLET | ORAL | Status: DC
  Administered 2021-04-21 – 2021-04-23 (×3): 2 via ORAL
  Filled 2021-04-20 (×3): qty 2

## 2021-04-20 MED ORDER — LABETALOL HCL 5 MG/ML IV SOLN
INTRAVENOUS | Status: AC
  Filled 2021-04-20: qty 4

## 2021-04-20 MED ORDER — ONDANSETRON HCL 4 MG/2ML IJ SOLN: 4.0000 mg | INTRAMUSCULAR | Status: DC | PRN

## 2021-04-20 MED ORDER — DIBUCAINE (PERIANAL) 1 % EX OINT: 1.0000 "application " | TOPICAL_OINTMENT | CUTANEOUS | Status: DC | PRN

## 2021-04-20 MED ORDER — TERBUTALINE SULFATE 1 MG/ML IJ SOLN: 0.2500 mg | Freq: Once | INTRAMUSCULAR | Status: DC | PRN

## 2021-04-20 MED ORDER — HYDROXYZINE HCL 50 MG PO TABS
25.0000 mg | ORAL_TABLET | Freq: Three times a day (TID) | ORAL | Status: DC | PRN
  Administered 2021-04-20: 25 mg via ORAL
  Filled 2021-04-20: qty 1

## 2021-04-20 MED ORDER — DIPHENHYDRAMINE HCL 50 MG/ML IJ SOLN
12.5000 mg | INTRAMUSCULAR | Status: DC | PRN
Start: 2021-04-20 — End: 2021-04-20

## 2021-04-20 MED ORDER — EPHEDRINE 5 MG/ML INJ: 10.0000 mg | INTRAVENOUS | Status: DC | PRN

## 2021-04-20 MED ORDER — ONDANSETRON HCL 4 MG/2ML IJ SOLN
4.0000 mg | Freq: Four times a day (QID) | INTRAMUSCULAR | Status: DC | PRN
  Administered 2021-04-20 (×3): 4 mg via INTRAVENOUS
  Filled 2021-04-20 (×3): qty 2

## 2021-04-20 MED ORDER — LACTATED RINGERS IV SOLN: INTRAVENOUS | Status: DC

## 2021-04-20 MED ORDER — ONDANSETRON HCL 4 MG PO TABS: 4.0000 mg | ORAL_TABLET | ORAL | Status: DC | PRN

## 2021-04-20 MED ORDER — IBUPROFEN 800 MG PO TABS
800.0000 mg | ORAL_TABLET | Freq: Three times a day (TID) | ORAL | Status: DC
  Administered 2021-04-20 – 2021-04-23 (×8): 800 mg via ORAL
  Filled 2021-04-20 (×8): qty 1

## 2021-04-20 MED ORDER — BENZOCAINE-MENTHOL 20-0.5 % EX AERO: 1.0000 "application " | INHALATION_SPRAY | CUTANEOUS | Status: DC | PRN

## 2021-04-20 MED ORDER — MAGNESIUM SULFATE 40 GM/1000ML IV SOLN
1.0000 g/h | INTRAVENOUS | Status: DC
  Filled 2021-04-20: qty 1000

## 2021-04-20 MED ORDER — NICOTINE 21 MG/24HR TD PT24
21.0000 mg | MEDICATED_PATCH | Freq: Every day | TRANSDERMAL | Status: DC
  Administered 2021-04-20: 21 mg via TRANSDERMAL
  Filled 2021-04-20: qty 1

## 2021-04-20 MED ORDER — SODIUM CHLORIDE 0.9 % IV SOLN
300.0000 mg | Freq: Once | INTRAVENOUS | Status: AC
  Administered 2021-04-20: 300 mg via INTRAVENOUS
  Filled 2021-04-20: qty 15

## 2021-04-20 MED ORDER — ACETAMINOPHEN 325 MG PO TABS: 650.0000 mg | ORAL_TABLET | ORAL | Status: DC | PRN

## 2021-04-20 MED ORDER — SIMETHICONE 80 MG PO CHEW: 80.0000 mg | CHEWABLE_TABLET | ORAL | Status: DC | PRN

## 2021-04-20 MED ORDER — TETANUS-DIPHTH-ACELL PERTUSSIS 5-2.5-18.5 LF-MCG/0.5 IM SUSY: 0.5000 mL | PREFILLED_SYRINGE | Freq: Once | INTRAMUSCULAR | Status: DC

## 2021-04-20 MED ORDER — LIDOCAINE HCL (PF) 1 % IJ SOLN
INTRAMUSCULAR | Status: DC | PRN
  Administered 2021-04-20: 4 mL via EPIDURAL

## 2021-04-20 MED ORDER — LACTATED RINGERS IV SOLN: 500.0000 mL | INTRAVENOUS | Status: DC | PRN

## 2021-04-20 MED ORDER — LIDOCAINE HCL (PF) 1 % IJ SOLN: 30.0000 mL | INTRAMUSCULAR | Status: DC | PRN

## 2021-04-20 MED ORDER — SOD CITRATE-CITRIC ACID 500-334 MG/5ML PO SOLN: 30.0000 mL | ORAL | Status: DC | PRN

## 2021-04-20 MED ORDER — NICOTINE 7 MG/24HR TD PT24: 7.0000 mg | MEDICATED_PATCH | Freq: Every day | TRANSDERMAL | Status: DC

## 2021-04-20 NOTE — Anesthesia Postprocedure Evaluation (Signed)
Anesthesia Post Note  Patient: Meghan Bullock  Procedure(s) Performed: AN AD HOC LABOR EPIDURAL     Patient location during evaluation: Mother Baby Anesthesia Type: Epidural Level of consciousness: awake and alert Pain management: pain level controlled Vital Signs Assessment: post-procedure vital signs reviewed and stable Respiratory status: spontaneous breathing, nonlabored ventilation and respiratory function stable Cardiovascular status: stable Postop Assessment: no headache, no backache and epidural receding Anesthetic complications: no   No complications documented.  Last Vitals:  Vitals:   04/20/21 2001 04/20/21 2021  BP: (!) 130/99 (!) 159/91  Pulse: 89   Resp:    Temp:    SpO2:      Last Pain:  Vitals:   04/20/21 1905  TempSrc:   PainSc: 4    Pain Goal: Patients Stated Pain Goal: 7 (04/20/21 0835)                 Ren Aspinall

## 2021-04-20 NOTE — Anesthesia Preprocedure Evaluation (Signed)
Anesthesia Evaluation  Patient identified by MRN, date of birth, ID band Patient awake    Reviewed: Allergy & Precautions, NPO status , Patient's Chart, lab work & pertinent test results  History of Anesthesia Complications Negative for: history of anesthetic complications  Airway Mallampati: II   Neck ROM: Full    Dental   Pulmonary Current Smoker and Patient abstained from smoking.,    Pulmonary exam normal        Cardiovascular hypertension (Pre-E), Normal cardiovascular exam     Neuro/Psych PSYCHIATRIC DISORDERS Anxiety Bipolar Disorder negative neurological ROS     GI/Hepatic negative GI ROS, (+)     substance abuse (benzodiazepines)  marijuana use, Hepatitis -, C  Endo/Other  negative endocrine ROS  Renal/GU negative Renal ROS     Musculoskeletal negative musculoskeletal ROS (+) narcotic dependent  Abdominal   Peds  Hematology  (+) anemia ,  Plt 291k    Anesthesia Other Findings On methadone   Reproductive/Obstetrics                             Anesthesia Physical Anesthesia Plan  ASA: III  Anesthesia Plan: Epidural   Post-op Pain Management:    Induction:   PONV Risk Score and Plan: 2 and Treatment may vary due to age or medical condition  Airway Management Planned: Natural Airway  Additional Equipment: None  Intra-op Plan:   Post-operative Plan:   Informed Consent: I have reviewed the patients History and Physical, chart, labs and discussed the procedure including the risks, benefits and alternatives for the proposed anesthesia with the patient or authorized representative who has indicated his/her understanding and acceptance.       Plan Discussed with: Anesthesiologist  Anesthesia Plan Comments: (Labs reviewed. Platelets acceptable, patient not taking any blood thinning medications. Per RN, FHR tracing reported to be stable enough for sitting procedure.  Risks and benefits discussed with patient, including PDPH, backache, epidural hematoma, failed epidural, blood pressure changes, allergic reaction, and nerve injury. Patient expressed understanding and wished to proceed.)        Anesthesia Quick Evaluation

## 2021-04-20 NOTE — Progress Notes (Signed)
Confirmed pt's methadone dose of 135mg  daily from [facility] clinic. Per nurse at [facility], pt presented for most recent dose of methadone on 5/23 at 0830.  6/23, MD OB Fellow, Faculty Practice 04/20/2021 10:15 AM

## 2021-04-20 NOTE — Lactation Note (Signed)
This note was copied from a baby's chart. Lactation Consultation Note  Patient Name: Meghan Bullock [id number] Date: 04/20/2021   Age:28 hours LC went to assist with latching but Mom had to go to the bathroom. LC alerted RN to assist Mother.  Maternal Data    Feeding    LATCH Score                    Lactation Tools Discussed/Used    Interventions    Discharge    Consult Status      Lama Narayanan  Nicholson-Springer 04/20/2021, 8:42 PM

## 2021-04-20 NOTE — Progress Notes (Signed)
Labor Progress Note LORMA HEATER is a 30 y.o. [phone] at [redacted]w[redacted]d presented for PPROM, found to have preeclampsia without severe features on admission.   S: Patient laying comfortably, tired. Denies HA, change is vision, RUQ pain, CP, SOB. 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 She was noted to have multiple severe range SBP (160s), and therefore discussed with patient regarding beginning magnesium gtt and BP meds.   O:  BP 132/84   Pulse 73   Temp 98.4 F (36.9 C) (Axillary)   Resp 18   Ht 5\' 4"  (1.626 m)   Wt 80.3 kg   LMP 08/08/2020   SpO2 94%   BMI 30.38 kg/m  EFM: 120/mod var/+accels, no decels  CVE: Dilation: 3 Effacement (%): 80 Cervical Position: Anterior Station: -2 Presentation: Vertex Exam by:: [email or web address] RN   A&P: 30 y.o. 26 [redacted]w[redacted]d here for PPROM and preeclampsia, now with severe features (2/2 SBP 160s).  #Labor: Progressing well. Continue pitocin #Pain: Epidural #FWB: Cat I #GBS negative #PreE: Begin magnesium gtt, IV labetalol prn for severe range pressures   Shizue Kaseman, DO 10:07 AM

## 2021-04-20 NOTE — Discharge Summary (Signed)
Postpartum Discharge Summary     Patient Name: Meghan Bullock DOB: Jun 16, 1991 MRN: [id number]  Date of admission: 04/20/2021 Delivery date:04/20/2021  Delivering provider: Katherine Basset [facility]  Date of discharge: 04/23/2021  Admitting diagnosis: Pre-eclampsia, mild, antepartum, third trimester [O14.03] Intrauterine pregnancy: [redacted]w[redacted]d    Secondary diagnosis:  Principal Problem:   Preterm premature rupture of membranes (PPROM) delivered, current hospitalization Active Problems:   Substance abuse affecting pregnancy, antepartum   Iron deficiency anemia   Supervision of high risk pregnancy, antepartum   Preeclampsia, severe, third trimester   Methadone maintenance treatment affecting pregnancy in third trimester ([place]   History of hepatitis C   Maternal anemia with delivery, with current postpartum complication  Additional problems: None    Discharge diagnosis: Preterm Pregnancy Delivered, Preeclampsia (severe), Anemia and methadone use in pregnancy                                              Post partum procedures:IV Iron infusion Augmentation: Pitocin Complications: None  Hospital course: Onset of Labor With Vaginal Delivery      30y.o. yo [id number] 364w3das admitted in Latent Labor on 04/20/2021. Patient's labor course was complicated by development of severe preeclampsia.  She received magnesium sulfate eclampsia prophylaxis intrapartum and postpartum as per protocol. Labor course was as follows:  Membrane Rupture Time/Date: 12:00 AM ,04/20/2021   Delivery Method:Vaginal, Spontaneous  Episiotomy: None  Lacerations:  None  Patient was started on Nifedipine XL after delivery, was increased to 60 mg bid for BP control by time of discharge. She also received transfusion of 2 units pRBCs and Venofer for her acute on chronic postpartum anemia; improved from hemoglobin of 6.8 to 8.7.  Of note, patient also received her daily dose of Methadone 135 mg for OUD. She was also  very anxious, and had to be treated with a few doses of Ativan. She was told to follow up with mental health provider for further prescription and monitoring of benzodiazepines as needed; this was not prescribed for her at discharge.  She is ambulating, tolerating a regular diet, passing flatus, and urinating well. Patient is discharged home in stable condition on 04/23/21.  Newborn Data: Birth date:04/20/2021  Birth time:7:53 PM  Gender:Female  Living status:Living  Apgars:8 ,9  Weight:2716 g   Magnesium Sulfate received: Yes: Seizure prophylaxis BMZ received: No Rhophylac:N/A MMR:N/A T-DaP:Given prenatally Flu: Yes Transfusion:Yes  -2 units of pRBCs and IV Iron transfusion  Physical exam  Vitals:   04/22/21 1530 04/22/21 1942 04/22/21 2235 04/23/21 0309  BP: (!) 140/97 (!) 141/97 138/85 132/86  Pulse:  98 70   Resp: (!) 21 17 15 16   Temp: 98 F (36.7 C) 98.8 F (37.1 C) 98.8 F (37.1 C) 98.3 F (36.8 C)  TempSrc: Oral Oral Oral Oral  SpO2: 99% 100% 100% 99%  Weight:      Height:       General: alert, cooperative and no distress Lochia: appropriate Uterine Fundus: firm Incision: N/A DVT Evaluation: No evidence of DVT seen on physical exam. Negative Homan's sign. No cords or calf tenderness. Labs: CBC Latest Ref Rng & Units 04/22/2021 04/21/2021 04/21/2021  WBC 4.0 - 10.5 K/uL 13.0(H) 14.4(H) 16.1(H)  Hemoglobin 12.0 - 15.0 g/dL 8.7(L) 7.8(L) 6.8(LL)  Hematocrit 36.0 - 46.0 % 29.1(L) 26.2(L) 23.6(L)  Platelets 150 - 400 K/uL  289 263 284   CMP Latest Ref Rng & Units 04/22/2021 04/21/2021 04/20/2021  Glucose 70 - 99 mg/dL 65(L) 116(H) 78  BUN 6 - 20 mg/dL <5(L) <5(L) 5(L)  Creatinine 0.44 - 1.00 mg/dL 0.48 0.57 0.41(L)  Sodium 135 - 145 mmol/L 135 134(L) 134(L)  Potassium 3.5 - 5.1 mmol/L 3.8 3.9 3.5  Chloride 98 - 111 mmol/L 98 103 102  CO2 22 - 32 mmol/L 27 25 24   Calcium 8.9 - 10.3 mg/dL 8.9 7.6(L) 7.9(L)  Total Protein 6.5 - 8.1 g/dL 6.1(L) 5.2(L) 5.5(L)  Total  Bilirubin 0.3 - 1.2 mg/dL 0.7 0.6 0.4  Alkaline Phos 38 - 126 U/L 121 117 117  AST 15 - 41 U/L 18 18 12(L)  ALT 0 - 44 U/L 10 9 9     Edinburgh Score: Edinburgh Postnatal Depression Scale Screening Tool 04/21/2021  I have been able to laugh and see the funny side of things. 0  I have looked forward with enjoyment to things. 0  I have blamed myself unnecessarily when things went wrong. 2  I have been anxious or worried for no good reason. 3  I have felt scared or panicky for no good reason. 3  Things have been getting on top of me. 2  I have been so unhappy that I have had difficulty sleeping. 1  I have felt sad or miserable. 1  I have been so unhappy that I have been crying. 1  The thought of harming myself has occurred to me. 0  Edinburgh Postnatal Depression Scale Total 13     After visit meds:  Allergies as of 04/23/2021      Reactions   Fentanyl Hives   Per patient not allergic to Fentanyl; reaction to "something it was cut with"      Medication List    STOP taking these medications   pantoprazole 20 MG tablet Commonly known as: PROTONIX     TAKE these medications   Blood Pressure Kit Devi 1 Device by Does not apply route once a week.   ferrous sulfate 325 (65 FE) MG EC tablet Take 1 tablet (325 mg total) by mouth every other day.   ibuprofen 800 MG tablet Commonly known as: ADVIL Take 1 tablet (800 mg total) by mouth every 8 (eight) hours.   NIFEdipine 60 MG 24 hr tablet Commonly known as: ADALAT CC Take 1 tablet (60 mg total) by mouth 2 (two) times daily.        Discharge home in stable condition Infant Feeding: Bottle Infant Disposition: possibly rooming in due to concern about NAS Discharge instruction: per After Visit Summary and Postpartum booklet. Activity: Advance as tolerated. Pelvic rest for 6 weeks.  Diet: routine diet Future Appointments: Future Appointments  Date Time Provider [place]  04/30/2021  9:00 AM [facility] NURSE [facility]   05/19/2021  9:55 AM Aletha Halim, MD [facility]    Follow up Visit:  [place] [facility]. Schedule an appointment as soon as possible for a visit in 1 week(s).   Specialty: Obstetrics and Gynecology Why: BP check Contact information: [place] [address] [phone]              Please schedule this patient for a In person postpartum visit in 2-3 weeks with the following provider: MD. Also for interval tubal consult, sign Medicaid papers Additional Postpartum F/U:Postpartum Depression checkup, BP check 1 week and  methadone check  High risk pregnancy complicated by: preeclampsia, preterm birth from PPROM, methadone use in pregnancy, h/o hep C, iron defic anemia Delivery mode:  Vaginal, Spontaneous  Anticipated Birth Control:  Plans Interval BTL and Unsure   04/23/2021 Verita Schneiders, MD

## 2021-04-20 NOTE — H&P (Signed)
OBSTETRIC ADMISSION HISTORY AND PHYSICAL  Meghan Bullock is a 30 y.o. female [phone] with IUP at 40w3dby LMP (08/08/20) presenting for spontaneous ROM around midnight. She reports +FMs, No LOF, no VB, no blurry vision, headaches or peripheral edema, and RUQ pain.  She plans on breast and bottle feeding. She is unsure what she wants for birth control. [place] [facility] (08/08/20) ---> Estimated Date of Delivery: 05/15/21  Sono: @[redacted]w[redacted]d , CWD, normal anatomy, cephalic presentation, vertex lie, 1566g, 32% EFW   Prenatal History/Complications: - Mild Pre-eclampsia third trimester - Chronic Hepatitis C - Methadone for OUD - Anxiety  Past Medical History: Past Medical History:  Diagnosis Date  . Anemia   . Anxiety   . Bipolar 1 disorder ([place]   . Chlamydia   . Hepatitis C   . Heroin abuse ([place]   . Mononucleosis   . Ovarian cyst   . Polysubstance abuse ([place]   . Preterm premature rupture of membranes (PPROM) with unknown onset of labor 05/12/2014    Past Surgical History: Past Surgical History:  Procedure Laterality Date  . HAND SURGERY    . I & D EXTREMITY Left 09/16/2019   Procedure: IRRIGATION AND DEBRIDEMENT EXTREMITY;  Surgeon: CVerner Mould MD;  [facility]  Service: Orthopedics;  Laterality: Left;    Obstetrical History: OB History    Gravida  4   Para  2   Term  1   Preterm  1   AB  1   Living  2     SAB  1   IAB      Ectopic      Multiple  0   Live Births  2           Social History Social History   Socioeconomic History  . Marital status: Legally Separated    Spouse name: Not on file  . Number of children: Not on file  . Years of education: Not on file  . Highest education level: Not on file  Occupational History  . Not on file  Tobacco Use  . Smoking status: Current Every Day Smoker    Packs/day: 1.50    Years: 5.00    Pack years: 7.50    Types: Cigarettes  . Smokeless  tobacco: Never Used  Vaping Use  . Vaping Use: Never used  Substance and Sexual Activity  . Alcohol use: No  . Drug use: Yes    Types: Heroin, Marijuana    Comment: fentanyl-heroin 09/29/2020  . Sexual activity: Yes    Birth control/protection: None  Other Topics Concern  . Not on file  Social History Narrative  . Not on file   Social Determinants of Health   Financial Resource Strain: Not on file  Food Insecurity: No Food Insecurity  . Worried About RCharity fundraiserin the Last Year: Never true  . Ran Out of Food in the Last Year: Never true  Transportation Needs: Unmet Transportation Needs  . Lack of Transportation (Medical): Yes  . Lack of Transportation (Non-Medical): Yes  Physical Activity: Not on file  Stress: Not on file  Social Connections: Not on file    Family History: Family History  Problem Relation Age of Onset  . COPD Mother   . Anxiety disorder Mother   . Emphysema Mother     Allergies: Allergies  Allergen Reactions  . Fentanyl Hives    Medications Prior to Admission  Medication Sig Dispense Refill Last Dose  . Blood Pressure Monitoring (BLOOD PRESSURE KIT) DEVI 1 Device by Does not apply route once a week. 1 each 0   . ferrous sulfate 325 (65 FE) MG EC tablet Take 1 tablet (325 mg total) by mouth every other day. (Patient not taking: Reported on 04/13/2021) 30 tablet 2   . pantoprazole (PROTONIX) 20 MG tablet Take 1 tablet (20 mg total) by mouth 2 (two) times daily before a meal. 60 tablet 3      Review of Systems   All systems reviewed and negative except as stated in HPI  Blood pressure 134/74, pulse 67, temperature 98.1 F (36.7 C), temperature source Oral, resp. rate 15, height 5' 4"  (1.626 m), weight 80.3 kg, last menstrual period 08/08/2020, SpO2 100 %. General appearance: alert and cooperative Lungs: no increased WOB Heart: regular rate and rhythm Abdomen: soft, non-tender; Presentation: cephalic Fetal monitoringBaseline: 130 bpm,  Variability: Good {> 6 bpm), Accelerations: Reactive and no decels Uterine activity: Contractions every 2-3 mins Dilation: 1.5 Effacement (%): 80 Station: -2,-3 Exam by:: weston,rn   Prenatal labs: ABO, Rh: --/--/O POS (04/13 1739) Antibody: NEG (04/13 1739) Rubella: 5.33 (01/27 1632) RPR: NON REACTIVE (04/13 1743)  HBsAg: Negative (01/27 1632)  HIV: NON REACTIVE (04/13 1743)  GBS: Negative/-- (05/17 1123)  2 hr glucose: Normal (03/14/21) Genetic screening: Normal (12/24/20) Anatomy US: Normal (03/10/21)  Prenatal Transfer Tool  Maternal Diabetes: No Genetic Screening: Normal Maternal Ultrasounds/Referrals: Normal Fetal Ultrasounds or other Referrals:  None Maternal Substance Abuse:  Yes:  Type: Marijuana, Methadone, Other: Benzos Significant Maternal Medications:  None Significant Maternal Lab Results: None  No results found for this or any previous visit (from the past 24 hour(s)).  Patient Active Problem List   Diagnosis Date Noted  . [redacted] weeks gestation of pregnancy 03/13/2021  . 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 Opioid use disorder, severe, dependence ([place]) 03/11/2021  . Malnutrition of moderate degree 03/10/2021  . Pre-eclampsia, mild, antepartum, third trimester 02/02/2021  . Anxiety 02/02/2021  . Supervision of high risk pregnancy, antepartum 12/24/2020  . Abscess of wrist 03/11/2020  . Substance use disorder 03/11/2020  . Cellulitis and abscess of hand   . 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[place])   . Iron deficiency anemia 04/30/2018  . Substance abuse affecting pregnancy, antepartum 10/25/2017  . History of preterm delivery, currently pregnant in second trimester 10/25/2017  . Hepatitis C 10/23/2017  . Encephalopathy, toxic 10/03/2013  . Benzodiazepine abuse ([place]) 10/03/2013  . Heroin abuse [facility])     Assessment/Plan:  Meghan Bullock is a 30 y.o. [phone] at 18w3dhere for spontaneous ROM around midnight 04/19/21.    #Labor: Spontaneous ROM around midnight. Will start Pitocin.  #Pre-eclampsia: Mild range BP's. Labs pending. Patient asymptomatic. Continue to monitor. Low threshold for starting magnesium sulfate if patient has severe range BP's or becomes symptomatic.  #Methadone: reports she is on 1370mdaily. Took last dose yesterday. Will contact [facility] where patient receives Methadone for patient's dosage given we do not have it in our system. UDS pending #Hep C: viral load undetectable 1/22  #Anemia: Hgb 7.6 on admission. Plan IV iron PP #Pain: Desires epidural #FWB: Category I Strip #ID: GBS Negative #MOF: Breast and bottle #MOC: undecided #Circ: unsure if she wants inpatient or outpatient  Rayad B Shams, Medical Student  04/20/2021, 3:09 AM    I have seen and examined this patient and agree with above documentation in the  medical student's note.   Renee Harder, MSN, CNM 04/20/21 9:27 AM

## 2021-04-20 NOTE — MAU Note (Signed)
Pt reports ? Leaking fluid since , rectal pressure and contractions. Reports good fetal movement.

## 2021-04-20 NOTE — Lactation Note (Signed)
This note was copied from a baby's chart. Lactation Consultation Note LC attempted to see mom. Mom stated she didn't want to pump tonight she is to tired that she will start pumping tomorrow. May had been some tension between female visitor and her. I'm not sure. Mom wasn't in the mood to do anything. RN stated mom's b/p is elevate. Mom will be f/u tomorrow. DEBP and kit are in room not set up.  Patient Name: Meghan Bullock QQPYP'P Date: 04/20/2021   Age:72 hours  Maternal Data    Feeding Nipple Type: Slow - flow  LATCH Score                    Lactation Tools Discussed/Used    Interventions    Discharge    Consult Status      Theodoro Kalata 04/20/2021, 10:49 PM

## 2021-04-20 NOTE — Progress Notes (Signed)
LABOR PROGRESS NOTE  Meghan Bullock is a 30 y.o. [id number] at [redacted]w[redacted]d admitted for PPROM also with PreE with severe features  Subjective: Pt lying comfortably in bed. Has been getting some sleep. Denies headaches, vision changes, RUQ abdominal pain, chest pain, SHOB. No side effects since starting mag and labetalol.  Objective: BP 125/82   Pulse 66   Temp (!) 97.1 F (36.2 C) (Axillary)   Resp 16   Ht 5\' 4"  (1.626 m)   Wt 80.3 kg   LMP 08/08/2020   SpO2 94%   BMI 30.38 kg/m    Dilation: 4 Effacement (%): 80 Cervical Position: Anterior Station: -1,-2 Presentation: Vertex Exam by:: [email or web address] RN Fetal monitoring: Baseline: 125 bpm, Variability: Good {> 6 bpm), Accelerations: Reactive, and Decelerations: Absent Uterine activity: Frequency: reports every 3-4 minutes  Labs: Lab Results  Component Value Date   WBC 10.2 04/20/2021   HGB 7.6 (L) 04/20/2021   HCT 26.7 (L) 04/20/2021   MCV 70.8 (L) 04/20/2021   PLT 291 04/20/2021    Patient Active Problem List   Diagnosis Date Noted  . [redacted] weeks gestation of pregnancy 03/13/2021  . 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 Opioid use disorder, severe, dependence ([facility]) 03/11/2021  . Malnutrition of moderate degree 03/10/2021  . Pre-eclampsia, mild, antepartum, third trimester 02/02/2021  . Anxiety 02/02/2021  . Supervision of high risk pregnancy, antepartum 12/24/2020  . Abscess of wrist 03/11/2020  . Substance use disorder 03/11/2020  . Cellulitis and abscess of hand   . 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[facility])   . Iron deficiency anemia 04/30/2018  . Substance abuse affecting pregnancy, antepartum 10/25/2017  . History of preterm delivery, currently pregnant in second trimester 10/25/2017  . Hepatitis C 10/23/2017  . Encephalopathy, toxic 10/03/2013  . Benzodiazepine abuse ([facility]) 10/03/2013  . Heroin abuse ([facility])     Assessment / Plan: Induction of labor due to preeclampsia and PPROM,   progressing well on pitocin  #Labor: Progressing well, continue pitocin. Currently pitocin at 71mu/min #Fetal Wellbeing:  Category I, HR on low end of normal but normal variability despite magnesium #Pain Control: IV pain meds #ID: GBS negative #PreE with severe features: BP better controlled since starting magnesium drip and labetalol prn. Asymptomatic. Continue to monitor. #Anticipated MOD: NSVD   12m MD, PGY-1 Family Medicine Resident, [facility] Faculty Teaching Service  04/20/2021, 2:00 PM

## 2021-04-20 NOTE — Anesthesia Procedure Notes (Signed)
Epidural Patient location during procedure: OB Start time: 04/20/2021 8:22 AM End time: 04/20/2021 8:25 AM  Staffing Anesthesiologist: Beryle Lathe, MD Performed: anesthesiologist   Preanesthetic Checklist Completed: patient identified, IV checked, risks and benefits discussed, monitors and equipment checked, pre-op evaluation and timeout performed  Epidural Patient position: sitting Prep: DuraPrep Patient monitoring: continuous pulse ox and blood pressure Approach: midline Location: L3-L4 Injection technique: LOR saline  Needle:  Needle type: Tuohy  Needle gauge: 17 G Needle length: 9 cm Needle insertion depth: 6 cm Catheter size: 19 Gauge Catheter at skin depth: 11 cm Test dose: negative and Other (1% lidocaine)  Assessment Events: blood not aspirated  Additional Notes Patient identified. Risks including, but not limited to, bleeding, infection, nerve damage, paralysis, inadequate analgesia, blood pressure changes, nausea, vomiting, allergic reaction, postpartum back pain, itching, and headache were discussed. Patient expressed understanding and wished to proceed. Sterile prep and drape, including hand hygiene, mask, and sterile gloves were used. The patient was positioned and the spine was prepped. The skin was anesthetized with lidocaine. No paraesthesia or other complication noted. The patient did not experience any signs of intravascular injection such as tinnitus or metallic taste in mouth, nor signs of intrathecal spread such as rapid motor block. Please see nursing notes for vital signs. The patient tolerated the procedure well.   Leslye Peer, MDReason for block:procedure for pain

## 2021-04-21 LAB — COMPREHENSIVE METABOLIC PANEL
ALT: 9 U/L (ref 0–44)
AST: 18 U/L (ref 15–41)
Albumin: 2 g/dL — ABNORMAL LOW (ref 3.5–5.0)
Alkaline Phosphatase: 117 U/L (ref 38–126)
Anion gap: 6 (ref 5–15)
BUN: <5 mg/dL — ABNORMAL LOW (ref 6–20)
CO2: 25 mmol/L (ref 22–32)
Calcium: 7.6 mg/dL — ABNORMAL LOW (ref 8.9–10.3)
Chloride: 103 mmol/L (ref 98–111)
Creatinine, Ser: 0.57 mg/dL (ref 0.44–1.00)
GFR, Estimated: >60 mL/min (ref >60)
Glucose, Bld: 116 mg/dL — ABNORMAL HIGH (ref 70–99)
Potassium: 3.9 mmol/L (ref 3.5–5.1)
Sodium: 134 mmol/L — ABNORMAL LOW (ref 135–145)
Total Bilirubin: 0.6 mg/dL (ref 0.3–1.2)
Total Protein: 5.2 g/dL — ABNORMAL LOW (ref 6.5–8.1)

## 2021-04-21 LAB — CBC
HCT: 23.6 % — ABNORMAL LOW (ref 36.0–46.0)
HCT: 26.2 % — ABNORMAL LOW (ref 36.0–46.0)
Hemoglobin: 6.8 g/dL — CL (ref 12.0–15.0)
Hemoglobin: 7.8 g/dL — ABNORMAL LOW (ref 12.0–15.0)
MCH: 19.9 pg — ABNORMAL LOW (ref 26.0–34.0)
MCH: 21.3 pg — ABNORMAL LOW (ref 26.0–34.0)
MCHC: 28.8 g/dL — ABNORMAL LOW (ref 30.0–36.0)
MCHC: 29.8 g/dL — ABNORMAL LOW (ref 30.0–36.0)
MCV: 69.2 fL — ABNORMAL LOW (ref 80.0–100.0)
MCV: 71.6 fL — ABNORMAL LOW (ref 80.0–100.0)
Platelets: 284 10*3/uL (ref 150–400)
Platelets: 263 10*3/uL (ref 150–400)
RBC: 3.41 MIL/uL — ABNORMAL LOW (ref 3.87–5.11)
RBC: 3.66 MIL/uL — ABNORMAL LOW (ref 3.87–5.11)
RDW: 17.5 % — ABNORMAL HIGH (ref 11.5–15.5)
RDW: 19.2 % — ABNORMAL HIGH (ref 11.5–15.5)
WBC: 16.1 10*3/uL — ABNORMAL HIGH (ref 4.0–10.5)
WBC: 14.4 10*3/uL — ABNORMAL HIGH (ref 4.0–10.5)
nRBC: 0.1 % (ref 0.0–0.2)
nRBC: 0.3 % — ABNORMAL HIGH (ref 0.0–0.2)

## 2021-04-21 LAB — PREPARE RBC (CROSSMATCH)

## 2021-04-21 MED ORDER — NIFEDIPINE ER OSMOTIC RELEASE 30 MG PO TB24
30.0000 mg | ORAL_TABLET | Freq: Two times a day (BID) | ORAL | Status: DC
  Administered 2021-04-21 – 2021-04-22 (×2): 30 mg via ORAL
  Filled 2021-04-21 (×2): qty 1

## 2021-04-21 MED ORDER — NIFEDIPINE ER OSMOTIC RELEASE 30 MG PO TB24
30.0000 mg | ORAL_TABLET | Freq: Every day | ORAL | Status: DC
  Administered 2021-04-21: 30 mg via ORAL
  Filled 2021-04-21: qty 1

## 2021-04-21 MED ORDER — SODIUM CHLORIDE 0.9% IV SOLUTION: Freq: Once | INTRAVENOUS | Status: AC

## 2021-04-21 MED ORDER — NIFEDIPINE 10 MG PO CAPS: 30.0000 mg | ORAL_CAPSULE | Freq: Three times a day (TID) | ORAL | Status: DC

## 2021-04-21 MED ORDER — METHADONE HCL 10 MG/ML PO CONC
135.0000 mg | Freq: Every day | ORAL | Status: DC
Start: 2021-04-22 — End: 2021-04-23
  Administered 2021-04-22 – 2021-04-23 (×2): 135 mg via ORAL
  Filled 2021-04-21 (×2): qty 14

## 2021-04-21 MED ORDER — LORAZEPAM 0.5 MG PO TABS
0.5000 mg | ORAL_TABLET | Freq: Three times a day (TID) | ORAL | Status: DC | PRN
  Administered 2021-04-21 – 2021-04-23 (×5): 0.5 mg via ORAL
  Filled 2021-04-21 (×5): qty 1

## 2021-04-21 MED ORDER — NIFEDIPINE ER OSMOTIC RELEASE 60 MG PO TB24: 60.0000 mg | ORAL_TABLET | Freq: Two times a day (BID) | ORAL | Status: DC

## 2021-04-21 MED ORDER — DIPHENHYDRAMINE HCL 25 MG PO CAPS
25.0000 mg | ORAL_CAPSULE | Freq: Once | ORAL | Status: AC
  Administered 2021-04-21: 25 mg via ORAL
  Filled 2021-04-21: qty 1

## 2021-04-21 MED ORDER — NIFEDIPINE ER OSMOTIC RELEASE 30 MG PO TB24: 30.0000 mg | ORAL_TABLET | Freq: Two times a day (BID) | ORAL | Status: DC

## 2021-04-21 MED ORDER — ACETAMINOPHEN 325 MG PO TABS
650.0000 mg | ORAL_TABLET | Freq: Once | ORAL | Status: AC
Start: 2021-04-21 — End: 2021-04-21
  Administered 2021-04-21: 650 mg via ORAL
  Filled 2021-04-21: qty 2

## 2021-04-21 NOTE — Lactation Note (Signed)
This note was copied from a baby's chart. Lactation Consultation Note LC taught HE and spoon fed infant about 46ml. LC assisted mother with initiating pumping. Reinforced ed on feeding late preterm infant.  Patient Name: Meghan Bullock MNOTR'R Date: 04/21/2021 Reason for consult: Follow-up assessment Age:28 hours  Maternal Data Has patient been taught Hand Expression?: Yes Does the patient have breastfeeding experience prior to this delivery?: No +colostrum with HE Breasts are symmetrical; Left nipple inverts with compression  Feeding Mother's Current Feeding Choice: Breast Milk and Formula Nipple Type: Nfant Slow Flow (purple) Following late preterm feeding instructions  Offering EBM q3    Interventions Interventions: Education;Breast feeding basics reviewed;Expressed milk;Hand express;DEBP  Discharge WIC Program: Yes  Consult Status Consult Status: Follow-up Follow-up type: In-patient   Elder Negus, MA IBCLC 04/21/2021, 10:52 AM

## 2021-04-21 NOTE — Progress Notes (Signed)
RN to pt room per pump alarm. Pt not in the room. Pt unhooked IV and went outside. RN notified [facility] and paged MD. Pt currently in room SL and alert and oriented x4. RN awaiting orders.

## 2021-04-21 NOTE — Progress Notes (Signed)
Date and time results received: 04/21/21 0748  Test: Hemoglobin  Critical Value: 6.8  Name of Provider Notified: Dr. Donavan Foil   Orders Received? Or Actions Taken?: No new orders at this time. MD will come speak with pt regarding result and treatment options.

## 2021-04-21 NOTE — Progress Notes (Signed)
Post Partum Day 1 Subjective: anxiety. She left the floor and disconnected IV including blood, refused to restart magnesium  Objective: Blood pressure (!) 159/87, pulse 75, temperature 98 F (36.7 C), temperature source Oral, resp. rate 20, height 5\' 4"  (1.626 m), weight 80.3 kg, last menstrual period 08/08/2020, SpO2 100 %, unknown if currently breastfeeding.  Physical Exam:  General: alert, cooperative and no distress Lochia: appropriate Uterine Fundus: firm DVT Evaluation: No evidence of DVT seen on physical exam.  Recent Labs    04/20/21 0433 04/21/21 0645  HGB 7.6* 6.8*  HCT 26.7* 23.6*    Assessment/Plan: Plan for discharge tomorrow   LOS: 1 day   04/23/21 04/21/2021, 5:41 PM

## 2021-04-22 ENCOUNTER — Other Ambulatory Visit (HOSPITAL_COMMUNITY): Payer: Medicaid Other | Attending: Family Medicine

## 2021-04-22 LAB — COMPREHENSIVE METABOLIC PANEL
ALT: 10 U/L (ref 0–44)
AST: 18 U/L (ref 15–41)
Albumin: 2.4 g/dL — ABNORMAL LOW (ref 3.5–5.0)
Alkaline Phosphatase: 121 U/L (ref 38–126)
Anion gap: 10 (ref 5–15)
BUN: <5 mg/dL — ABNORMAL LOW (ref 6–20)
CO2: 27 mmol/L (ref 22–32)
Calcium: 8.9 mg/dL (ref 8.9–10.3)
Chloride: 98 mmol/L (ref 98–111)
Creatinine, Ser: 0.48 mg/dL (ref 0.44–1.00)
GFR, Estimated: >60 mL/min (ref >60)
Glucose, Bld: 65 mg/dL — ABNORMAL LOW (ref 70–99)
Potassium: 3.8 mmol/L (ref 3.5–5.1)
Sodium: 135 mmol/L (ref 135–145)
Total Bilirubin: 0.7 mg/dL (ref 0.3–1.2)
Total Protein: 6.1 g/dL — ABNORMAL LOW (ref 6.5–8.1)

## 2021-04-22 LAB — BPAM RBC
Blood Product Expiration Date: 202206242359
Blood Product Expiration Date: 202206242359
ISSUE DATE / TIME: 202205250920
ISSUE DATE / TIME: 202205251239
Unit Type and Rh: 5100
Unit Type and Rh: 5100

## 2021-04-22 LAB — TYPE AND SCREEN
ABO/RH(D): O POS
Antibody Screen: NEGATIVE
Unit division: 0
Unit division: 0

## 2021-04-22 LAB — CBC
HCT: 29.1 % — ABNORMAL LOW (ref 36.0–46.0)
Hemoglobin: 8.7 g/dL — ABNORMAL LOW (ref 12.0–15.0)
MCH: 21.3 pg — ABNORMAL LOW (ref 26.0–34.0)
MCHC: 29.9 g/dL — ABNORMAL LOW (ref 30.0–36.0)
MCV: 71.1 fL — ABNORMAL LOW (ref 80.0–100.0)
Platelets: 289 10*3/uL (ref 150–400)
RBC: 4.09 MIL/uL (ref 3.87–5.11)
RDW: 19.6 % — ABNORMAL HIGH (ref 11.5–15.5)
WBC: 13 10*3/uL — ABNORMAL HIGH (ref 4.0–10.5)
nRBC: 0.5 % — ABNORMAL HIGH (ref 0.0–0.2)

## 2021-04-22 MED ORDER — NIFEDIPINE ER OSMOTIC RELEASE 60 MG PO TB24
60.0000 mg | ORAL_TABLET | Freq: Two times a day (BID) | ORAL | Status: DC
  Administered 2021-04-22 – 2021-04-23 (×2): 60 mg via ORAL
  Filled 2021-04-22 (×2): qty 1

## 2021-04-22 MED ORDER — NIFEDIPINE ER OSMOTIC RELEASE 30 MG PO TB24
30.0000 mg | ORAL_TABLET | ORAL | Status: AC
  Administered 2021-04-22: 30 mg via ORAL
  Filled 2021-04-22: qty 1

## 2021-04-22 NOTE — Clinical Social Work Maternal (Signed)
CLINICAL SOCIAL WORK MATERNAL/CHILD NOTE  Patient Details  Name: Meghan Bullock MRN: [id number] Date of Birth: [birth year]  Date:  04/22/2021  Clinical Social Worker Initiating Note:  Abundio Miu, [place] Date/Time: Initiated:  04/22/21/1304     Child's Name:  Meghan Bullock   Biological Parents:  Mother,Father (Father: Meghan Bullock 11/19/1969)   Need for Interpreter:  None   Reason for Referral:  [place] Substance Use/Substance Use During Pregnancy    Address:  [address] [place] [address]    Phone number:  [phone]  Additional phone number:   Household Members/Support Persons (HM/SP):   Household Member/Support Person 1,Household Member/Support Person 2   HM/SP Name Relationship DOB or Age  HM/SP -1 Debra Sapp mom    HM/SP -2   mom's fiance    HM/SP -3        HM/SP -4        HM/SP -5        HM/SP -6        HM/SP -7        HM/SP -8          Natural Supports (not living in the home):  Immediate Family   Professional Supports: Other (Comment) ([place])   Employment: Unemployed   Type of Work:     Education:  9 to 11 years (11th Grade)   Homebound arranged: No  Financial Resources:  [employer]   Other Resources:  Physicist, medical  (Plans to apply for [facility])   Cultural/Religious Considerations Which May Impact Care:    Strengths:  Ability to meet basic needs ,Home prepared for child    Psychotropic Medications:         Pediatrician:       Pediatrician List:   [place]      Pediatrician Fax Number:    Risk Factors/Current Problems:  Substance Use ,Mental Health Concerns    Cognitive State:  Able to Concentrate ,Alert ,Goal Oriented ,Linear Thinking    Mood/Affect:  Calm ,Comfortable ,Interested ,Relaxed ,Happy    CSW Assessment: CSW met with MOB at bedside to discuss  substance use during pregnancy and behavioral health concerns (edinburgh score 13). CSW reintroduced self, MOB recalled meeting with CSW during pregnancy, FOB present. CSW asked FOB to leave the room to speak with MOB privately. FOB left the room. CSW explained the reason for consult. MOB was welcoming, pleasant and remained engaged during assessment. MOB reported that she resides with her mom and mom's fiance. MOB reported that she has all items needed to care for infant including a car seat and basinet. CSW inquired about MOB's support system, MOB reported that her mom, grandma and FOB's dad are supports. MOB shared that since she has been participating in treatment she has lots of supports. MOB reported that her older children Meghan Bullock 05/13/14 resides with paternal grandma and maternal great grandparents;; Meghan Bullock 06/15/18 resides with his paternal grandparents).   CSW inquired about MOB's mental health history. MOB reported that she was diagnosed with Bipolar Disorder in 2012. MOB reported that her Bipolar has been better as she is not on drugs. MOB reported that she believes her Bipolar symptoms are induced by drug use. MOB reported that she was diagnosed with Anxiety in 2015. MOB reported that her anxiety has gotten better since she gave birth. MOB  reported that she plans to follow up with the [facility] to treat her anxiety symptoms. MOB shared that the medication that she has been prescribed in the past for anxiety has not been helpful, noting she doesn't like the way it makes her feel. MOB denied any history of postpartum depression. CSW and MOB discussed edinburgh score 13, MOB denied any current stressors. MOB reported that the only thing bothering her this past week was pain. CSW inquired about how MOB was feeling emotionally after giving birth, MOB reported that she was feeling good. MOB presented calm and did not demonstrate any acute mental health  signs/symptoms. CSW assessed for safety, MOB denied any SI, HI and domestic violence.   CSW provided education regarding the baby blues period vs. perinatal mood disorders, discussed treatment and gave resources for mental health follow up if concerns arise.  CSW recommends self-evaluation during the postpartum time period using the New Mom Checklist from Postpartum Progress and encouraged MOB to contact a medical professional if symptoms are noted at any time.    CSW provided review of Sudden Infant Death Syndrome (SIDS) precautions.    CSW informed MOB about the hospital drug screen policy due to substance use during pregnancy and medication assistance treatment program participation. 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 CSW congratulated MOB on her participation. CSW inquired about any substance use since last admission, MOB reported that she used marijuana for nausea. MOB denied any other substance use since last admission. CSW informed MOB that infant's UDS was positive for benzodiazepines. MOB denied any benzodiazepine use besides what was administered at the hospital. Per chart review, Benzodiazepines were administered to MOB after delivery. CSW explained that a CPS report would be made and CPS would follow up with MOB. MOB verbalized understanding. CSW inquired about any transportation barriers, MOB reported that she need a new bus pass. CSW provided MOB with a 31 day bus pass. MOB denied any additional needs.   CSW made a [facility] CPS report due to infant's positive UDS for benzodiazepines. CSW awaiting return call from CPS. Currently there are barriers to discharge.   CSW Plan/Description:  Sudden Infant Death Syndrome (SIDS) Education,Perinatal Mood and Anxiety Disorder (PMADs) Education,Hospital Drug Screen Policy Information,Child Protective Service Report ,CSW Awaiting CPS Disposition  Plan,Psychosocial Support and Ongoing Assessment of Needs,Neonatal Abstinence Syndrome (NAS) Education,Other Information/Referral to [employer], LCSW 04/22/2021, 1:07 PM

## 2021-04-22 NOTE — Progress Notes (Signed)
POSTPARTUM PROGRESS NOTE  PPD #1  Subjective:  Meghan Bullock is a 30 y.o. [phone] s/p SVD at [redacted]w[redacted]d. Today she notes no acute complaints. She denies any problems with ambulating, voiding or po intake. Denies nausea or vomiting. She has + flatus, +BM.  Pain is well controlled.  Lochia minimal Denies fever/chills/chest pain/SOB.  no HA, no  blurry vision, no RUQ pain  Objective: Blood pressure 130/87, pulse 79, temperature 98.3 F (36.8 C), temperature source Oral, resp. rate (!) 21, height 5\' 4"  (1.626 m), weight 80.3 kg, last menstrual period 08/08/2020, SpO2 98 %, unknown if currently breastfeeding.  Physical Exam:  General: alert, cooperative and no distress Chest: no respiratory distress, CTAB Heart: regular rate and rhythm Abdomen: soft, nontender Uterine Fundus: firm, appropriately tender DVT Evaluation: No calf swelling or tenderness Extremities: no edema Skin: warm, dry  Results for orders placed or performed during the hospital encounter of 04/20/21 (from the past 24 hour(s))  Prepare RBC (crossmatch)     Status: None   Collection Time: 04/21/21  7:56 AM  Result Value Ref Range   Order Confirmation      ORDER PROCESSED BY BLOOD BANK Performed at [facility], [address] [place], [place]   CBC     Status: Abnormal   Collection Time: 04/21/21  5:51 PM  Result Value Ref Range   WBC 14.4 (H) 4.0 - 10.5 K/uL   RBC 3.66 (L) 3.87 - 5.11 MIL/uL   Hemoglobin 7.8 (L) 12.0 - 15.0 g/dL   HCT 04/23/21 (L) 38.1 - 01.7 %   MCV 71.6 (L) 80.0 - 100.0 fL   MCH 21.3 (L) 26.0 - 34.0 pg   MCHC 29.8 (L) 30.0 - 36.0 g/dL   RDW 51.0 (H) 25.8 - 52.7 %   Platelets 263 150 - 400 K/uL   nRBC 0.3 (H) 0.0 - 0.2 %  CBC     Status: Abnormal   Collection Time: 04/22/21  5:31 AM  Result Value Ref Range   WBC 13.0 (H) 4.0 - 10.5 K/uL   RBC 4.09 3.87 - 5.11 MIL/uL   Hemoglobin 8.7 (L) 12.0 - 15.0 g/dL   HCT 04/24/21 (L) 42.3 - 53.6 %   MCV 71.1 (L) 80.0 - 100.0 fL   MCH 21.3  (L) 26.0 - 34.0 pg   MCHC 29.9 (L) 30.0 - 36.0 g/dL   RDW 14.4 (H) 31.5 - 40.0 %   Platelets 289 150 - 400 K/uL   nRBC 0.5 (H) 0.0 - 0.2 %  Comprehensive metabolic panel     Status: Abnormal   Collection Time: 04/22/21  5:31 AM  Result Value Ref Range   Sodium 135 135 - 145 mmol/L   Potassium 3.8 3.5 - 5.1 mmol/L   Chloride 98 98 - 111 mmol/L   CO2 27 22 - 32 mmol/L   Glucose, Bld 65 (L) 70 - 99 mg/dL   BUN <5 (L) 6 - 20 mg/dL   Creatinine, Ser 04/24/21 0.44 - 1.00 mg/dL   Calcium 8.9 8.9 - 6.19 mg/dL   Total Protein 6.1 (L) 6.5 - 8.1 g/dL   Albumin 2.4 (L) 3.5 - 5.0 g/dL   AST 18 15 - 41 U/L   ALT 10 0 - 44 U/L   Alkaline Phosphatase 121 38 - 126 U/L   Total Bilirubin 0.7 0.3 - 1.2 mg/dL   GFR, Estimated 50.9 >32 mL/min   Anion gap 10 5 - 15    Assessment/Plan: >67  Meghan Bullock is a 30 y.o. [phone] s/p SVD at [redacted]w[redacted]d PPD#1 complicated by: 1) Preeclampsia with severe features -s/p Mag -currently on ProcardiaXL 30mg  bid, BP within high-normal range -pt asymptomatic, labs stable as above  2) Anemia -s/p 2upRBC and IV venofer -HGb stable as above, pt asymptomatic  3) SUD -doing well with methadone  4) anxiety -Ativan at night working well for her -encouraged pt to establish care with PCP/psychiatry for management  Contraception: options reviewed- considering inpatient IUD Feeding: both  Dispo: Meeting postpartum milestones appropriately, plan for discharge home later today, if BP remained stable   LOS: 2 days   , DO Faculty Attending, [facility] 04/22/2021, 7:17 AM

## 2021-04-22 NOTE — Progress Notes (Signed)
[place] [facility] CPS social worker (Stephanie Wainwright) came to visit with MOB and infant at the hospital. CPS social worker reported that she plans to follow up with [place] [facility] CPS and will update CSW tomorrow.   CSW awaiting return call from [place] [facility] CPS.   Haseeb Fiallos, LCSW Clinical Social Worker [facility] Cell#: [phone]  

## 2021-04-22 NOTE — Lactation Note (Signed)
This note was copied from a baby's chart. Lactation Consultation Note  Patient Name: Boy Joely Losier IHKVQ'Q Date: 04/22/2021 Reason for consult: Follow-up assessment;1st time breastfeeding;Late-preterm 34-36.6wks;Infant < 6lbs Age:27 hours   I followed up with Ms. Dam and her 69 hour old son, [name]. She reports that she has not been pumping a lot. She asked for demonstration of the manual pump. We did a flange fitting (size 24) and demonstration. No colostrum noted at this time.  Ms. Losurdo is interested in obtaining a pump. I offered to put in a referral to [facility]  Baby last fed about 2 hours ago. Ms. Woehl was interested in practicing latch. I showed her how to position [place] in cross cradle hold. He was too sleepy to latch at this time. I recommended that she try again later.  We practiced hand expression. I did not note colostrum at this time, but I provided reassurance regarding when to expect milk volume to increase.  I conducted discharge education. Ms. Mckinnie had not chosen a pediatrician at this time, but she states that she has the list of providers.  Plan: Pump every 3 hours (manual pump do 10 minutes for each breast) Continue to practice latching Follow up with [facility] regarding DEBP or other breast feeding support Follow up with OP with [facility]. I placed a referral. Follow late preterm infant guidelines provided at yesterdays' referral  Maternal Data Has patient been taught Hand Expression?: Yes Does the patient have breastfeeding experience prior to this delivery?: No  Feeding Mother's Current Feeding Choice: Breast Milk and Formula Nipple Type: Nfant Extra Slow Flow (gold)  LATCH Score Latch: Too sleepy or reluctant, no latch achieved, no sucking elicited.  Audible Swallowing: None  Type of Nipple: Everted at rest and after stimulation  Comfort (Breast/Nipple): Soft / non-tender  Hold (Positioning): Assistance needed to correctly  position infant at breast and maintain latch.  LATCH Score: 5   Lactation Tools Discussed/Used Tools: Pump Breast pump type: Manual Pump Education: Setup, frequency, and cleaning Reason for Pumping: stimulate breasts Pumping frequency: inconsistent Pumped volume: 0 mL  Interventions Interventions: Breast feeding basics reviewed;Assisted with latch;Hand express;Adjust position;Support pillows;Hand pump;Education  Discharge Discharge Education: Outpatient recommendation;Outpatient Epic message sent Pump: Manual WIC Program: Yes  Consult Status Consult Status: PRN Date: 04/22/21 Follow-up type: Call as needed    Walker Shadow 04/22/2021, 9:00 AM

## 2021-04-23 ENCOUNTER — Other Ambulatory Visit (HOSPITAL_COMMUNITY): Payer: Self-pay

## 2021-04-23 DIAGNOSIS — O9903 Anemia complicating the puerperium: Secondary | ICD-10-CM | POA: Diagnosis present

## 2021-04-23 LAB — SURGICAL PATHOLOGY

## 2021-04-23 MED ORDER — IBUPROFEN 800 MG PO TABS
800.0000 mg | ORAL_TABLET | Freq: Three times a day (TID) | ORAL | 0 refills | Status: DC
  Filled 2021-04-23: qty 30, 10d supply, fill #0

## 2021-04-23 MED ORDER — NIFEDIPINE ER 60 MG PO TB24
60.0000 mg | ORAL_TABLET | Freq: Two times a day (BID) | ORAL | 2 refills | Status: DC
  Filled 2021-04-23: qty 60, 30d supply, fill #0

## 2021-04-23 NOTE — Progress Notes (Signed)
Discharge instructions and prescriptions given to pt. Discussed post vaginal delivery care, signs and symptoms to report to the MD, upcoming appointments, and meds. Pt verbalizes understanding and has no questions at this time. Pt discharged in stable condition. 

## 2021-04-23 NOTE — Progress Notes (Signed)
SW escorted [facility] CPS worker S. Wainwright to MOB's bed at room 108. Per CPS there are no barriers to infant discharging to MOB when infant is medically ready.  CPS will continue to provide resources and supports to family post discharge.   CSW will continue to offer resources and supports to family while infant remains in NICU.   Blaine Hamper, MSW, LCSW Clinical Social Work [phone]

## 2021-04-24 ENCOUNTER — Inpatient Hospital Stay (HOSPITAL_COMMUNITY): Payer: Medicaid Other

## 2021-04-24 ENCOUNTER — Inpatient Hospital Stay (HOSPITAL_COMMUNITY)
Admission: AD | Admit: 2021-04-24 | Payer: Medicaid Other | Source: Home / Self Care | Admitting: Obstetrics & Gynecology

## 2021-04-24 ENCOUNTER — Ambulatory Visit: Payer: Self-pay

## 2021-04-24 NOTE — Lactation Note (Addendum)
This note was copied from a baby's chart. Lactation Consultation Note  Patient Name: Meghan Bullock IHKVQ'Q Date: 04/24/2021 Reason for consult: Follow-up assessment;Late-preterm 34-36.6wks;Infant < 6lbs;Hyperbilirubinemia Age:28 days   LC visited with P3 Mom of LPTI under phototherapy, bilirubin level WNL this am and baby at 6% weight loss.  Mom currently in SUD treatment and on Methadone.  No barrier to discharge per SW consult 5/27.  UDS on Mom and baby + for benzodiazepines.  Mom has not pumped since the first pumping.  Mom states breasts are heavier this am. Education on supply meets demand regarding milk supply and importance of frequent breastfeeding or pumping.  Mom wanting to do breast and formula feeding.  Prefers to use hand held manual pump.  DEBP set up in room.  LC noted that pump parts were scattered all over the room, including some under Mom's mattress.  LC collected all the parts and demonstrated to Mom how to disassemble pump parts, wash, rinse and air dry.   Recommended pumping at every feeding if Mom would like to support her milk supply.  Mom seems non-committal about breast feeding or breastmilk feeding. Explained how to use the initiation setting on the pump until the volume is >20 ml times 3. Mom currently on [facility], appt not until 6/10 per Mom.  [facility] loaner discussed.  Mom aware of $30 deposit needed.  Baby currently being held in the sunny window "to give him a break on the phototherapy".    RN aware of need to assist with pumping, Mom aware she needs to ask for help.  Baby being bottle fed by FOB.  Mom noted to be leaving the room often.  Voiced concern to RN. Earliest discharge is 5/29 per Pediatrician.  Lactation Tools Discussed/Used Tools: Pump;Flanges Flange Size: 24 Breast pump type: Double-Electric Breast Pump Pump Education: Setup, frequency, and cleaning Reason for Pumping: Support milk supply/LPTI/<6 lbs Pumping frequency: once Pumped volume: 0  mL  Interventions Interventions: Skin to skin;Hand express;Breast massage;DEBP;Hand pump;Education    Consult Status Consult Status: Follow-up Date: 04/25/21 Follow-up type: In-patient    Meghan Bullock 04/24/2021, 8:53 AM

## 2021-04-25 ENCOUNTER — Ambulatory Visit: Payer: Self-pay

## 2021-04-25 NOTE — Lactation Note (Signed)
This note was copied from a baby's chart. Lactation Consultation Note  Patient Name: Meghan Bullock [id number] Date: 04/25/2021 Reason for consult: Follow-up assessment Age:28 days   LC Follow Up Visit:  Mother is not interested in breast feeding.  She will be formula feeding only.  Engorgement prevention/treatment reviewed.  Father present.  Mother voiced an interest in being discharged today.   Maternal Data    Feeding Nipple Type: Extra Slow Flow  LATCH Score                    Lactation Tools Discussed/Used    Interventions    Discharge Discharge Education: Engorgement and breast care  Consult Status Consult Status: Complete Date: 04/25/21 Follow-up type: Call as needed    Bexleigh Theriault R Adelis Docter 04/25/2021, 11:28 AM

## 2021-04-27 NOTE — BH Specialist Note (Deleted)
Integrated Behavioral Health via Telemedicine Visit  04/27/2021 Meghan Bullock [id number]  Number of Integrated Behavioral Health visits: 1 Session Start time: 9:45***  Session End time: 10:15*** Total time: {IBH Total Time:21014050}  Referring Provider: *** Patient/Family location: Home*** [facility] Provider [facility]: [facility]  All persons participating in visit: Patient *** and [facility] Meghan Bullock ***  Types of Service: {CHL AMB TYPE OF [phone]}  I connected with Meghan Bullock and/or Meghan Bullock {family members:20773} via  Telephone or Engineer, civil (consulting)  (Video is Caregility application) and verified that I am speaking with the correct person using two identifiers. Discussed confidentiality: {YES/NO:21197}  I discussed the limitations of telemedicine and the availability of in person appointments.  Discussed there is a possibility of technology failure and discussed alternative modes of communication if that failure occurs.  I discussed that engaging in this telemedicine visit, they consent to the provision of behavioral healthcare and the services will be billed under their insurance.  Patient and/or legal guardian expressed understanding and consented to Telemedicine visit: {YES/NO:21197}  Presenting Concerns: Patient and/or family reports the following symptoms/concerns: *** Duration of problem: ***; Severity of problem: {Mild/Moderate/Severe:20260}  Patient and/or Family's Strengths/Protective Factors: {CHL AMB BH PROTECTIVE [phone]}  Goals Addressed: Patient will: 1.  Reduce symptoms of: {IBH Symptoms:21014056}  2.  Increase knowledge and/or ability of: {IBH Patient Tools:21014057}  3.  Demonstrate ability to: {IBH Goals:21014053}  Progress towards Goals: {CHL AMB BH PROGRESS TOWARDS GOALS:[phone]}  Interventions: Interventions utilized:  {IBH  Interventions:21014054} Standardized Assessments completed: {IBH Screening Tools:21014051}  Patient and/or Family Response: ***  Assessment: Patient currently experiencing ***.   Patient may benefit from ***.  Plan: 1. Follow up with behavioral health clinician on : *** 2. Behavioral recommendations: *** 3. Referral(s): {IBH Referrals:21014055}  I discussed the assessment and treatment plan with the patient and/or parent/guardian. They were provided an opportunity to ask questions and all were answered. They agreed with the plan and demonstrated an understanding of the instructions.   They were advised to call back or seek an in-person evaluation if the symptoms worsen or if the condition fails to improve as anticipated.  Valetta Close Starleen Trussell, LCSW   Edinburgh Postnatal Depression Scale Screening Tool 04/21/2021 04/21/2021  I have been able to laugh and see the funny side of things. 0 (No Data)  I have looked forward with enjoyment to things. 0 -  I have blamed myself unnecessarily when things went wrong. 2 -  I have been anxious or worried for no good reason. 3 -  I have felt scared or panicky for no good reason. 3 -  Things have been getting on top of me. 2 -  I have been so unhappy that I have had difficulty sleeping. 1 -  I have felt sad or miserable. 1 -  I have been so unhappy that I have been crying. 1 -  The thought of harming myself has occurred to me. 0 -  Edinburgh Postnatal Depression Scale Total 13 -    Depression screen [facility] 2/9 03/26/2021 03/10/2021 02/02/2021 01/05/2021 12/24/2020  Decreased Interest 0 2 1 1  0  Down, Depressed, Hopeless 0 1 0 0 1  PHQ - 2 Score 0 3 1 1 1   Altered sleeping 0 1 1 1 2   Tired, decreased energy 1 3 1 1 1   Change in appetite 1 3 1  0 0  Feeling bad or failure about yourself  1 0  1 1 0  Trouble concentrating 0 1 1 1  0  Moving slowly or fidgety/restless 0 0 0 0 0  Suicidal thoughts 0 0 0 0 0  PHQ-9 Score 3 11 6 5 4    GAD 7 : Generalized  Anxiety Score 03/26/2021 03/10/2021 02/02/2021 01/05/2021  Nervous, Anxious, on Edge 1 1 1 1   Control/stop worrying 1 1 1  0  Worry too much - different things 1 1 1  0  Trouble relaxing 0 3 1 0  Restless 0 2 0 1  Easily annoyed or irritable 2 1 1  0  Afraid - awful might happen 0 1 1 0  Total GAD 7 Score 5 10 6  2

## 2021-04-28 ENCOUNTER — Encounter: Payer: Self-pay | Admitting: Oncology

## 2021-04-28 ENCOUNTER — Ambulatory Visit (INDEPENDENT_AMBULATORY_CARE_PROVIDER_SITE_OTHER): Payer: Medicaid Other | Admitting: Clinical

## 2021-04-28 DIAGNOSIS — F4321 Adjustment disorder with depressed mood: Secondary | ICD-10-CM

## 2021-04-28 DIAGNOSIS — O99345 Other mental disorders complicating the puerperium: Secondary | ICD-10-CM

## 2021-04-28 DIAGNOSIS — O99325 Drug use complicating the puerperium: Secondary | ICD-10-CM

## 2021-04-28 DIAGNOSIS — F1121 Opioid dependence, in remission: Secondary | ICD-10-CM

## 2021-04-28 DIAGNOSIS — Z658 Other specified problems related to psychosocial circumstances: Secondary | ICD-10-CM

## 2021-04-28 NOTE — BH Specialist Note (Signed)
Integrated Behavioral Health via Telemedicine Visit  04/28/2021 GLENNYS SCHORSCH [id number]  Number of Integrated Behavioral Health visits: 1 Session Start time: 4:00  Session End time: 4:33 Total time: 31  Referring Provider: Merian Capron, MD Patient/Family location: Home [facility] Provider location: Center for [facility] at [facility] for Women  All persons participating in visit: Patient Meghan Bullock and [facility] Florentino Laabs   Types of Service: Individual psychotherapy and Video visit  I connected with [name] and/or Devota Pace mother via  Telephone or Engineer, civil (consulting)  (Video is Caregility application) and verified that I am speaking with the correct person using two identifiers. Discussed confidentiality: Yes   I discussed the limitations of telemedicine and the availability of in person appointments.  Discussed there is a possibility of technology failure and discussed alternative modes of communication if that failure occurs.  I discussed that engaging in this telemedicine visit, they consent to the provision of behavioral healthcare and the services will be billed under their insurance.  Patient and/or legal guardian expressed understanding and consented to Telemedicine visit: Yes   Presenting Concerns: Patient and/or family reports the following symptoms/concerns: Pt states she she has good support at home, sleeping and eating well, attending methadone clinic daily; primary concerns are some transportation issues and friend of the family recently passed, and anxiety about formula shortage. Pt does not agree with bipolar diagnosis in chart; attributes previous symptoms to past substance use.  Duration of problem: Postpartum; Severity of problem: moderate  Patient and/or Family's Strengths/Protective Factors: Social connections, Concrete supports in place (healthy food, safe environments, etc.) and Sense of  purpose  Goals Addressed: Patient will: 1.  Reduce symptoms of: anxiety, depression and stress  2.  Increase knowledge and/or ability of: healthy habits  3.  Demonstrate ability to: Increase healthy adjustment to current life circumstances, Increase adequate support systems for patient/family and Increase motivation to adhere to plan of care  Progress towards Goals: Ongoing  Interventions: Interventions utilized:  Solution-Focused Strategies, Psychoeducation and/or Health Education and Link to [employer] Assessments completed: Not Needed  Patient and/or Family Response: Pt agrees with treatment plan  Assessment: Patient currently experiencing Opioid use disorder, severe, in early remission on maintenance therapy, Psychosocial stress and Grief  Patient may benefit from psychoeducation and brief therapeutic interventions regarding coping with symptoms of anxiety, current life stress, grief .  Plan: 1. Follow up with behavioral health clinician on : Two weeks; Call Asher Muir if needed prior to scheduled visit at [phone] 2. Behavioral recommendations:  -Begin taking iron pills as prescribed(may take with orange juice if able) -Continue prioritizing healthy self-care daily through healthy sleeping and eating  -Continue attending methadone clinic daily -Continue plan to establish care with outpatient counseling as referred -Consider registering for online new mom support group at either www.[email or web address].net or [email or web address]  -Accept referral to [facility] for Women -Consider additional transportation and  grief support (on After Visit Summary) to share with family as needed 3. Referral(s): Integrated Art gallery manager (In Clinic) and [employer]:  Food, Transportation and grief support  I discussed the assessment and treatment plan with the patient and/or parent/guardian. They were provided an opportunity to ask  questions and all were answered. They agreed with the plan and demonstrated an understanding of the instructions.   They were advised to call back or seek an in-person evaluation if the symptoms worsen or if the condition fails to improve as anticipated.  Valetta Close Davidmichael Zarazua, LCSW

## 2021-04-28 NOTE — Patient Instructions (Addendum)
Center for [facility] at [facility] for Women [address] [place], [place] [address] [phone] (main office) [phone] (Balbina Depace's office)  New mom online support groups: Www.conehealthybaby.com  Www.postpartum.net (look under "online support", lots of groups to choose from)  If you are in need of transportation to get to and from your appointments in our office.  You can reach Transportation Services by calling [phone] Monday - Friday  7am-6pm.  Transportation Resources [place] [employer] (GTA) [address] [place], [place], [place] [address] https://www.[place].gov/departments/transportation/gdot-divisions/[place]-transit-agency-public-transportation-division     . Fixed-route bus services, including regional fare cards for PART, [place], [place], and [place]STA buses.  . Reduced fare bus ID's available for Medicaid, Medicare, and "orange card" recipients.  [employer] [employer] SCAT offers curb-to-curb and door-to-door bus services for people with disabilities who are unable to use a fixed-bus route; also offers a shared-ride program.   Helpful tips:  -Routes available online and physical maps available at the main bus hub lobby (each for a specific route) -Smartphone directions often include bus routes (see the "bus" icon, next to the "car" and "walk" icons) -Routes differ on weekends, evenings and holidays, so plan ahead!  -If you have Medicaid, Medicare, or orange card, plan to obtain a reduced-fare ID to save 50% on rides. Check days and times to obtain an ID, and bring all necessary documents.   [employer] [place]) [address] [place], [place]. [address] [place], [place] [address] [phone] SpotApps.nl **Fixed-bus route services, and demand response bus service for older adults  Department of Social [facility] [address] [place], [place] [address] [phone] www.MysterySinger.com.cy **Medicaid transportation is available to [facility] recipients who need assistance getting to [facility] medical appointments and providers  [facility] [address] [place] [address], [place], [place] [address] www.cjmedicaltransportation.com  ** Offers non-emergency transportation for medical appointments  [employer] [address] [place], [place] [address] [phone] www.wheels4hope.org **REFERRAL NEEDED by specific agencies (see website), after meeting specified criteria only  [employer] for [employer]) [address] [place], [place] [address] [phone]  BuyingShow.es  *Regional fixed-bus routes between counties (example: [place]) and [employer] [place]  [address], [place], [place] [address] [phone] Http://senior-resources-[place].org  Museum/gallery exhibitions officer available (call or see website for details)  [facility] [employer] [facility] [address] [place], [place] [address] [phone] www.senioradults.org  **Ride coordination

## 2021-04-29 ENCOUNTER — Telehealth: Payer: Self-pay

## 2021-04-29 NOTE — Telephone Encounter (Signed)
Received information from [facility] nurse that she was at the pt's home and her BP is 141/9 and she takes Procardia 60 mg po bid which she had not taken her medication prior to BP taken.  I called pt and confirmed with pt that her BP was elevated with headache.  Pt informed me that she took her Procardia after the BP was taken so she felt that her BP would be fine now.  I verified pt's BP appt for tomorrow, 04/30/21 @ 0900.  Pt stated that she will be here and she will take her BP medication before coming to the appt.    Meghan Bullock  04/29/21

## 2021-04-30 ENCOUNTER — Ambulatory Visit (INDEPENDENT_AMBULATORY_CARE_PROVIDER_SITE_OTHER): Payer: Self-pay | Admitting: *Deleted

## 2021-04-30 ENCOUNTER — Other Ambulatory Visit: Payer: Self-pay

## 2021-04-30 ENCOUNTER — Ambulatory Visit: Payer: Self-pay

## 2021-04-30 VITALS — BP 119/84 | HR 61 | Ht 64.0 in | Wt 153.7 lb

## 2021-04-30 DIAGNOSIS — K2100 Gastro-esophageal reflux disease with esophagitis, without bleeding: Secondary | ICD-10-CM

## 2021-04-30 DIAGNOSIS — I10 Essential (primary) hypertension: Secondary | ICD-10-CM

## 2021-04-30 MED ORDER — PANTOPRAZOLE SODIUM 20 MG PO TBEC: 20.0000 mg | DELAYED_RELEASE_TABLET | Freq: Every day | ORAL | 1 refills | Status: DC

## 2021-04-30 MED ORDER — LISINOPRIL 10 MG PO TABS
10.0000 mg | ORAL_TABLET | Freq: Every day | ORAL | 0 refills | Status: DC
Start: 2021-04-30 — End: 2022-02-04

## 2021-04-30 NOTE — Progress Notes (Signed)
Here for bp check. States is taking procardia but not always bid. States side effects are why she doesn't- side effects include migraine, constipation.  C/o intermittent headaches =9-10 and not relieved by tylenol, but eventally go away with rest. Denies headache at present.   Mild edema noted in feet and ankles. C/o constipation and hemorrhoids. Discussed over the counter medications and other things she can use. She would like refill of protonix.  We discussed since she is postpartum and especially since she had severe preeclampsia she should go to the hospital if she has a severe headache unrelieved by tylenol or the other symptoms of preeclampsia. She is taking methodone daily from methadone clinic. BTL consent not signed because medicaid not on file yet and patient does not have card with her.  Reviewed history and bp's with Dr. Vergie Living. Orders to change procardia to once a day to see if that maintains bp and decreases side effects. Bp check one week. Also approved protonix.  I informed patient and she states she really has just been taking procardia once a day and always gets severe headache and is not going to take the procardia.  I attempted to discuss with Dr. Vergie Living again but he was unavailable. Discussed with Dr. Shawnie Pons and orders for lisinopril given, bp check in one week. Reviewed with patient new prescription and bp check in one week. She voices understanding.  Clayton Bosserman,RN

## 2021-05-03 ENCOUNTER — Encounter: Payer: Self-pay | Admitting: Oncology

## 2021-05-04 ENCOUNTER — Encounter: Payer: Self-pay | Admitting: Oncology

## 2021-05-04 ENCOUNTER — Ambulatory Visit (HOSPITAL_COMMUNITY): Payer: Medicaid Other | Admitting: Licensed Clinical Social Worker

## 2021-05-05 ENCOUNTER — Telehealth: Payer: Self-pay

## 2021-05-05 ENCOUNTER — Ambulatory Visit: Payer: Self-pay | Admitting: Pharmacist

## 2021-05-05 NOTE — Telephone Encounter (Signed)
Called mobile number; call cannot be completed as dialed. Called home number; VM left stating I am calling to follow up on missed appt. Encouraged pt to call back to reschedule. MyChart message sent.

## 2021-05-14 NOTE — Progress Notes (Signed)
Patient was assessed and managed by nursing staff during this encounter. I have reviewed the chart and agree with the documentation and plan. I have also made any necessary editorial changes.  [place], MD 05/14/2021 3:19 PM

## 2021-05-19 ENCOUNTER — Ambulatory Visit: Payer: Self-pay | Admitting: Obstetrics and Gynecology

## 2021-05-21 ENCOUNTER — Other Ambulatory Visit: Payer: Self-pay

## 2021-05-21 ENCOUNTER — Encounter: Payer: Self-pay | Admitting: Oncology

## 2021-05-21 DIAGNOSIS — K2100 Gastro-esophageal reflux disease with esophagitis, without bleeding: Secondary | ICD-10-CM

## 2021-05-21 DIAGNOSIS — I10 Essential (primary) hypertension: Secondary | ICD-10-CM

## 2021-05-21 NOTE — Progress Notes (Signed)
Pt came into front office to request refill of Protonix. Per chart review, pt has current refill. Called pharmacy to ask them to prepare for pick up. Pt notified by Vanessa Swaziland (front office admin) that prescription will be ready for pick up today.

## 2021-05-22 ENCOUNTER — Encounter: Payer: Self-pay | Admitting: Oncology

## 2021-05-23 ENCOUNTER — Encounter: Payer: Self-pay | Admitting: Oncology

## 2021-05-24 ENCOUNTER — Encounter: Payer: Self-pay | Admitting: Oncology

## 2021-05-25 ENCOUNTER — Encounter: Payer: Self-pay | Admitting: Family Medicine

## 2021-05-25 ENCOUNTER — Ambulatory Visit (INDEPENDENT_AMBULATORY_CARE_PROVIDER_SITE_OTHER): Payer: Self-pay | Admitting: Family Medicine

## 2021-05-25 ENCOUNTER — Encounter: Payer: Self-pay | Admitting: Oncology

## 2021-05-25 DIAGNOSIS — K2100 Gastro-esophageal reflux disease with esophagitis, without bleeding: Secondary | ICD-10-CM

## 2021-05-25 DIAGNOSIS — I10 Essential (primary) hypertension: Secondary | ICD-10-CM

## 2021-05-25 DIAGNOSIS — F1120 Opioid dependence, uncomplicated: Secondary | ICD-10-CM

## 2021-05-25 DIAGNOSIS — O1413 Severe pre-eclampsia, third trimester: Secondary | ICD-10-CM

## 2021-05-25 MED ORDER — IBUPROFEN 800 MG PO TABS: 800.0000 mg | ORAL_TABLET | Freq: Three times a day (TID) | ORAL | 0 refills | Status: DC

## 2021-05-25 MED ORDER — PANTOPRAZOLE SODIUM 20 MG PO TBEC: 20.0000 mg | DELAYED_RELEASE_TABLET | Freq: Every day | ORAL | 5 refills | Status: DC

## 2021-05-25 NOTE — Patient Instructions (Addendum)
Postpartum Care After Vaginal Delivery The following information offers guidance about how to care for yourself from the time you deliver your baby to 6-12 weeks after delivery (postpartum period). If you have problems or questions, contact your health care provider for more specific instructions. Follow these instructions at home: Vaginal bleeding It is normal to have vaginal bleeding (lochia) after delivery. Wear a sanitary pad for bleeding and discharge. During the first week after delivery, the amount and appearance of lochia is often similar to a menstrual period. Over the next few weeks, it will gradually decrease to a dry, yellow-brown discharge. For most women, lochia stops completely by 4-6 weeks after delivery, but can vary. Change your sanitary pads frequently. Watch for any changes in your flow, such as: A sudden increase in volume. A change in color. Large blood clots. If you pass a blood clot from your vagina, save it and call your health care provider. Do not flush blood clots down the toilet before talking with your health care provider. Do not use tampons or douches until your health care provider approves. If you are not breastfeeding, your period should return 6-8 weeks after delivery. If you are feeding your baby breast milk only, your period may not return until you stop breastfeeding. Perineal care  Keep the area between the vagina and the anus (perineum) clean and dry. Use medicated pads and pain-relieving sprays and creams as directed. If you had a surgical cut in the perineum (episiotomy) or a tear, check the area for signs of infection until you are healed. Check for: More redness, swelling, or pain. Fluid or blood coming from the cut or tear. Warmth. Pus or a bad smell. You may be given a squirt bottle to use instead of wiping to clean the perineum area after you use the bathroom. Pat the area gently to dry it. To relieve pain caused by an episiotomy, a tear, or  swollen veins in the anus (hemorrhoids), take a warm sitz bath 2-3 times a day. In a sitz bath, the warm water should only come up to your hips and cover your buttocks. Breast care In the first few days after delivery, your breasts may feel heavy, full, and uncomfortable (breast engorgement). Milk may also leak from your breasts. Ask your health care provider about ways to help relieve the discomfort. If you are breastfeeding: Wear a bra that supports your breasts and fits well. Use breast pads to absorb milk that leaks. Keep your nipples clean and dry. Apply creams and ointments as told. You may have uterine contractions every time you breastfeed for up to several weeks after delivery. This helps your uterus return to its normal size. If you have any problems with breastfeeding, notify your health care provider or lactation consultant. If you are not breastfeeding: Avoid touching your breasts. Do not squeeze out (express) milk. Doing this can make your breasts produce more milk. Wear a good-fitting bra and use cold packs to help with swelling. Intimacy and sexuality Ask your health care provider when you can engage in sexual activity. This may depend upon: Your risk of infection. How fast you are healing. Your comfort and desire to engage in sexual activity. You are able to get pregnant after delivery, even if you have not had your period. Talk with your health care provider about methods of birth control (contraception) or family planning if you desire future pregnancies. Medicines Take over-the-counter and prescription medicines only as told by your health care provider. Take an   over-the-counter stool softener to help ease bowel movements as told by your health care provider. If you were prescribed an antibiotic medicine, take it as told by your health care provider. Do not stop taking the antibiotic even if you start to feel better. Review all previous and current prescriptions to check for  possible transfer into breast milk. Activity Gradually return to your normal activities as told by your health care provider. Rest as much as possible. Nap while your baby is sleeping. Eating and drinking  Drink enough fluid to keep your urine pale yellow. To help prevent or relieve constipation, eat high-fiber foods every day. Choose healthy eating to support breastfeeding or weight loss goals. Take your prenatal vitamins until your health care provider tells you to stop. General tips/recommendations Do not use any products that contain nicotine or tobacco. These products include cigarettes, chewing tobacco, and vaping devices, such as e-cigarettes. If you need help quitting, ask your health care provider. Do not drink alcohol, especially if you are breastfeeding. Do not take medications or drugs that are not prescribed to you, especially if you are breastfeeding. Visit your health care provider for a postpartum checkup within the first 3-6 weeks after delivery. Complete a comprehensive postpartum visit no later than 12 weeks after delivery. Keep all follow-up visits for you and your baby. Contact a health care provider if: You feel unusually sad or worried. Your breasts become red, painful, or hard. You have a fever or other signs of an infection. You have bleeding that is soaking through one pad an hour or you have blood clots. You have a severe headache that doesn't go away or you have vision changes. You have nausea and vomiting and are unable to eat or drink anything for 24 hours. Get help right away if: You have chest pain or difficulty breathing. You have sudden, severe leg pain. You faint or have a seizure. You have thoughts about hurting yourself or your baby. If you ever feel like you may hurt yourself or others, or have thoughts about taking your own life, get help right away. Go to your nearest emergency department or: Call your local emergency services (911 in the  U.S.). The National Suicide Prevention Lifeline at 1-800-273-8255. This suicide crisis helpline is open 24 hours a day. Text the Crisis Text Line at 741741 (in the U.S.). Summary The period of time after you deliver your newborn up to 6-12 weeks after delivery is called the postpartum period. Keep all follow-up visits for you and your baby. Review all previous and current prescriptions to check for possible transfer into breast milk. Contact a health care provider if you feel unusually sad or worried during the postpartum period. This information is not intended to replace advice given to you by your health care provider. Make sure you discuss any questions you have with your health care provider. Document Revised: 07/30/2020 Document Reviewed: 07/30/2020 Elsevier Patient Education  2022 Elsevier Inc.  

## 2021-05-25 NOTE — Progress Notes (Signed)
Post Partum Visit Note  Meghan Bullock is a 30 y.o. [phone] female who presents for a postpartum visit. She is 5 weeks postpartum following a normal spontaneous vaginal delivery.  I have fully reviewed the prenatal and intrapartum course. The delivery was at [redacted]w[redacted]d gestational weeks.  Anesthesia: epidural. Postpartum course has been uneventful. Baby is doing well. Baby is feeding by bottle - Good Start Colic . Bleeding staining only. Bowel function is normal. Bladder function is normal. Patient is sexually active. Contraception method is none. Postpartum depression screening: negative.   The pregnancy intention screening data noted above was reviewed. Potential methods of contraception were discussed. The patient elected to proceed with Hormonal Implant.    Edinburgh Postnatal Depression Scale - 05/25/21 1022       Edinburgh Postnatal Depression Scale:  In the Past 7 Days   I have been able to laugh and see the funny side of things. 0    I have looked forward with enjoyment to things. 1    I have blamed myself unnecessarily when things went wrong. 1    I have been anxious or worried for no good reason. 2    I have felt scared or panicky for no good reason. 3    Things have been getting on top of me. 3    I have been so unhappy that I have had difficulty sleeping. 0    I have felt sad or miserable. 1    I have been so unhappy that I have been crying. 0    The thought of harming myself has occurred to me. 0    Edinburgh Postnatal Depression Scale Total 11             Health Maintenance Due  Topic Date Due   COVID-19 Vaccine (1) Never done   Pneumococcal Vaccine 70-4 Years old (1 - PCV) Never done    The following portions of the patient's history were reviewed and updated as appropriate: allergies, current medications, past family history, past medical history, past social history, past surgical history, and problem list.  Review of Systems Pertinent items noted in HPI  and remainder of comprehensive ROS otherwise negative.  Objective:  BP (!) 134/92   Pulse 89   Wt 160 lb (72.6 kg)   LMP 08/08/2020   BMI 27.46 kg/m    General:  alert, cooperative, and appears stated age   Breasts:  not indicated  Lungs: Comfortable on room air  Heart:  Not examined  Abdomen: soft, non-tender; bowel sounds normal; no masses,  no organomegaly   Wound N/a  GU exam:  not indicated       Assessment:     Problem List Items Addressed This Visit       Cardiovascular and Mediastinum   Preeclampsia, severe, third trimester     Other   NSVD (normal spontaneous vaginal delivery) - Primary   Opioid use disorder, severe, dependence (HCC)   Other Visit Diagnoses     Routine postpartum follow-up            Normal postpartum exam.   Plan:   Essential components of care per ACOG recommendations:  1.  Mood and well being: Patient with negative depression screening today. Reviewed local resources for support.  - Patient tobacco use? Yes, not addressed at today's visit - hx of drug use? Yes, stable on methadone    2. Infant care and feeding:  -Patient currently breastmilk feeding? No.  -Social determinants  of health (SDOH) reviewed in EPIC. No concerns. She reports she has good family support. Would like some help with transportation to clinic, will refer to [facility]  3. Sexuality, contraception and birth spacing - Patient does not want a pregnancy in the next year.  Desired family size is uncertain.  - Reviewed forms of contraception in tiered fashion. Patient desired  Nexplanon  today, however last unprotected intercourse 3 days prior. Will return in two weeks for UPT and Nexplanon insertion.   4. Sleep and fatigue -Encouraged family/partner/community support of 4 hrs of uninterrupted sleep to help with mood and fatigue  5. Physical Recovery  - Discussed patients delivery and complications. She describes her labor as good. - Patient had a  Vaginal problems after delivery including severe pre-e requiring magnesium . Patient had no laceration. Perineal healing reviewed. Patient expressed understanding - Patient has urinary incontinence? No. - Patient is safe to resume physical and sexual activity  6.  Health Maintenance - HM due items addressed No - declined colpo at this visit - Last pap smear  Diagnosis  Date Value Ref Range Status  12/24/2020 (A)  Final   - Atypical squamous cells of undetermined significance (ASC-US)   Pap smear not done at today's visit.  -Breast Cancer screening indicated? No.   7. Chronic Disease/Pregnancy Condition follow up: Hypertension and Abnormal pap smear - Severe PreE at delivery, continues to have elevated BP. Continue lisinopril given she will get Nexplanon at next visit - ASCUS, +HRHPV during pregnancy, will get colpo at next visit  - PCP follow up  Venora Maples, MD Center for [facility], [facility]

## 2021-05-26 ENCOUNTER — Encounter: Payer: Self-pay | Admitting: Oncology

## 2021-05-27 ENCOUNTER — Other Ambulatory Visit: Payer: Self-pay

## 2021-05-27 ENCOUNTER — Ambulatory Visit (INDEPENDENT_AMBULATORY_CARE_PROVIDER_SITE_OTHER): Payer: Medicaid Other | Admitting: Behavioral Health

## 2021-05-27 DIAGNOSIS — F1121 Opioid dependence, in remission: Secondary | ICD-10-CM | POA: Diagnosis not present
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
## 2021-06-01 ENCOUNTER — Encounter: Payer: Self-pay | Admitting: Oncology

## 2021-06-08 ENCOUNTER — Ambulatory Visit: Payer: Self-pay | Admitting: Family Medicine

## 2021-06-14 ENCOUNTER — Telehealth: Payer: Self-pay

## 2021-06-14 DIAGNOSIS — I10 Essential (primary) hypertension: Secondary | ICD-10-CM

## 2021-06-14 DIAGNOSIS — K2100 Gastro-esophageal reflux disease with esophagitis, without bleeding: Secondary | ICD-10-CM

## 2021-06-14 MED ORDER — PANTOPRAZOLE SODIUM 20 MG PO TBEC: 20.0000 mg | DELAYED_RELEASE_TABLET | Freq: Two times a day (BID) | ORAL | 5 refills | Status: DC

## 2021-06-14 NOTE — Telephone Encounter (Signed)
Patient called and left voicemail on nurse line requesting a new prescription for Protonix. States she discussed increasing to twice daily with Crissie Reese, MD at her last appointment. Per chart review, new rx was sent on 05/25/21 for daily Protonix. Will forward to provider for review.

## 2021-06-14 NOTE — Telephone Encounter (Signed)
Protonix 20 mg BID ordered per Crissie Reese, MD. Returned patient's call. Called pt mobile number; VM not set up. Called home number; call answered but no one responds to greeting.   Will send MyChart message.

## 2021-06-30 ENCOUNTER — Encounter: Payer: Self-pay | Admitting: Oncology

## 2021-07-02 ENCOUNTER — Encounter: Payer: Self-pay | Admitting: Oncology

## 2021-07-03 ENCOUNTER — Encounter: Payer: Self-pay | Admitting: Oncology

## 2021-07-04 ENCOUNTER — Encounter: Payer: Self-pay | Admitting: Oncology

## 2021-07-05 ENCOUNTER — Encounter: Payer: Self-pay | Admitting: Oncology

## 2021-07-05 ENCOUNTER — Telehealth: Payer: Self-pay | Admitting: Lactation Services

## 2021-07-05 NOTE — Telephone Encounter (Signed)
Patient called and LM on Lactation Line that she would like a call back about her appointment and also has some questions and concerns.

## 2021-07-05 NOTE — Telephone Encounter (Signed)
Returned patients call. Patient did not answer, voicemail box not set up so not able to reach patient. My Chart message sent.

## 2021-07-05 NOTE — Telephone Encounter (Signed)
Patient called and asked about Protonix prescription. She reports she needs it twice a day and if no taking she is throwing up. Reviewed it was renewed on 7/18. [facility] [address] [facility] [address], she will call her pharmacy and have prescription transferred. Patient to call if has issues getting refilled.

## 2021-07-06 ENCOUNTER — Encounter: Payer: Self-pay | Admitting: Oncology

## 2021-07-07 ENCOUNTER — Telehealth (HOSPITAL_COMMUNITY): Payer: Self-pay | Admitting: Licensed Clinical Social Worker

## 2021-07-07 ENCOUNTER — Ambulatory Visit (HOSPITAL_COMMUNITY): Payer: Medicaid Other | Admitting: Licensed Clinical Social Worker

## 2021-07-07 ENCOUNTER — Encounter: Payer: Self-pay | Admitting: Oncology

## 2021-07-07 NOTE — Telephone Encounter (Signed)
LCSW sent pt two links to phone with no response. LCSW then called pt who picked up. Pt stated that her son had covid and she was in the middle of an appt with him. Meghan Bullock asked "Can you call me back this afternoon" LCSW stated that she could reschedule or be placed on a move up list. Pt opted to reschedule for Aug 25th 3pm.

## 2021-07-09 ENCOUNTER — Encounter: Payer: Self-pay | Admitting: Family Medicine

## 2021-07-09 ENCOUNTER — Other Ambulatory Visit (HOSPITAL_COMMUNITY)
Admission: RE | Admit: 2021-07-09 | Discharge: 2021-07-09 | Disposition: A | Discharge status: Home / Self Care | Payer: Medicaid Other | Source: Ambulatory Visit | Attending: Family Medicine | Admitting: Family Medicine

## 2021-07-09 ENCOUNTER — Ambulatory Visit (INDEPENDENT_AMBULATORY_CARE_PROVIDER_SITE_OTHER): Payer: Medicaid Other | Admitting: Family Medicine

## 2021-07-09 ENCOUNTER — Other Ambulatory Visit: Payer: Self-pay

## 2021-07-09 VITALS — BP 112/75 | HR 89 | Wt 170.9 lb

## 2021-07-09 DIAGNOSIS — R87810 Cervical high risk human papillomavirus (HPV) DNA test positive: Secondary | ICD-10-CM | POA: Diagnosis present

## 2021-07-09 DIAGNOSIS — I10 Essential (primary) hypertension: Secondary | ICD-10-CM

## 2021-07-09 DIAGNOSIS — Z30017 Encounter for initial prescription of implantable subdermal contraceptive: Secondary | ICD-10-CM | POA: Insufficient documentation

## 2021-07-09 DIAGNOSIS — R87610 Atypical squamous cells of undetermined significance on cytologic smear of cervix (ASC-US): Secondary | ICD-10-CM

## 2021-07-09 DIAGNOSIS — R87612 Low grade squamous intraepithelial lesion on cytologic smear of cervix (LGSIL): Secondary | ICD-10-CM | POA: Insufficient documentation

## 2021-07-09 DIAGNOSIS — Z3202 Encounter for pregnancy test, result negative: Secondary | ICD-10-CM

## 2021-07-09 DIAGNOSIS — K2100 Gastro-esophageal reflux disease with esophagitis, without bleeding: Secondary | ICD-10-CM

## 2021-07-09 DIAGNOSIS — F419 Anxiety disorder, unspecified: Secondary | ICD-10-CM

## 2021-07-09 LAB — POCT PREGNANCY, URINE: Preg Test, Ur: NEGATIVE

## 2021-07-09 MED ORDER — GABAPENTIN 400 MG PO CAPS: 400.0000 mg | ORAL_CAPSULE | Freq: Three times a day (TID) | ORAL | 5 refills | Status: DC

## 2021-07-09 MED ORDER — ETONOGESTREL 68 MG ~~LOC~~ IMPL
68.0000 mg | DRUG_IMPLANT | Freq: Once | SUBCUTANEOUS | Status: AC
  Administered 2021-07-09: 68 mg via SUBCUTANEOUS

## 2021-07-09 MED ORDER — PANTOPRAZOLE SODIUM 20 MG PO TBEC
20.0000 mg | DELAYED_RELEASE_TABLET | Freq: Two times a day (BID) | ORAL | 5 refills | Status: DC
Start: 2021-07-09 — End: 2021-12-07

## 2021-07-09 NOTE — Addendum Note (Signed)
Addended byVidal Schwalbe on: 07/09/2021 11:23 AM   Modules accepted: Orders

## 2021-07-09 NOTE — Progress Notes (Signed)
GYNECOLOGY OFFICE VISIT NOTE  History:   Meghan Bullock is a 30 y.o. [phone] here today for nexplanon insertion and colposcopy.  See separate procedure notes.  Request refill of protonix and gabapentin prescription. Uses gabapentin primarily for anxiety and nerve pain, feels it is very effective. Taking 400mg  TID Feels it works much better than benzos Had phone intake with [facility]  Health Maintenance Due  Topic Date Due   COVID-19 Vaccine (1) Never done   Pneumococcal Vaccine 63-71 Years old (1 - PCV) Never done   INFLUENZA VACCINE  06/28/2021    Past Medical History:  Diagnosis Date   Anemia    Anxiety    Bipolar 1 disorder (HCC)    Chlamydia    Hepatitis C    Heroin abuse (HCC)    Mononucleosis    Ovarian cyst    Polysubstance abuse (HCC)    Preterm premature rupture of membranes (PPROM) with unknown onset of labor 05/12/2014    Past Surgical History:  Procedure Laterality Date   HAND SURGERY     I & D EXTREMITY Left 09/16/2019   Procedure: IRRIGATION AND DEBRIDEMENT EXTREMITY;  Surgeon: 09/18/2019, MD;  Location: [facility] OR;  Service: Orthopedics;  Laterality: Left;    The following portions of the patient's history were reviewed and updated as appropriate: allergies, current medications, past family history, past medical history, past social history, past surgical history and problem list.   Health Maintenance:   Last pap: Lab Results  Component Value Date   DIAGPAP (A) 12/24/2020    - Atypical squamous cells of undetermined significance (ASC-US)   HPVHIGH Positive (A) 12/24/2020   Colpo today  Last mammogram:  N/a    Review of Systems:  Pertinent items noted in HPI and remainder of comprehensive ROS otherwise negative.  Physical Exam:  BP 112/75   Pulse 89   Wt 170 lb 14.4 oz (77.5 kg)   LMP 08/06/2020   BMI 29.33 kg/m  CONSTITUTIONAL: Well-developed, well-nourished female in no acute distress.  HEENT:  Normocephalic, atraumatic.  External right and left ear normal. No scleral icterus.  NECK: Normal range of motion, supple, no masses noted on observation SKIN: No rash noted. Not diaphoretic. No erythema. No pallor. MUSCULOSKELETAL: Normal range of motion. No edema noted. NEUROLOGIC: Alert and oriented to person, place, and time. Normal muscle tone coordination.  PSYCHIATRIC: Normal mood and affect. Normal behavior. Normal judgment and thought content. RESPIRATORY: Effort normal, no problems with respiration noted ABDOMEN: No masses noted. No other overt distention noted.   PELVIC: Normal appearing external genitalia; normal appearing vaginal mucosa and cervix.  No abnormal discharge noted.    Labs and Imaging Results for orders placed or performed in visit on 07/09/21 (from the past 168 hour(s))  Pregnancy, urine POC   Collection Time: 07/09/21 10:01 AM  Result Value Ref Range   Preg Test, Ur NEGATIVE NEGATIVE   No results found.    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 Engaging with BH. Stable on methadone.  Routine preventative health maintenance measures emphasized. Please refer to After Visit Summary for other counseling recommendations.   Return in 3 months (on 10/09/2021) for routine follow up.    Total face-to-face time with patient: 20 minutes.  Over 50% of encounter was spent on counseling and coordination  of care.   Venora Maples, MD/MPH Attending Family Medicine Physician, [facility] for [facility], [facility]

## 2021-07-09 NOTE — Progress Notes (Signed)
     GYNECOLOGY OFFICE PROCEDURE NOTE  Meghan Bullock is a 30 y.o. [phone] here for Nexplanon insertion.    Nexplanon insertion Procedure Patient identified, informed consent performed, consent signed.   Patient does understand that irregular bleeding is a very common side effect of this medication. She was advised to have backup contraception for one week after placement. Pregnancy test in clinic today was negative.  Appropriate time out taken.  Patient's right arm was prepped and draped in the usual sterile fashion. The ruler used to measure and mark insertion area.  Patient was prepped with alcohol swab and then injected with 3 ml of 1% lidocaine.  She was prepped with betadine, Nexplanon removed from packaging,  Device confirmed in needle, then inserted full length of needle and withdrawn per handbook instructions. Nexplanon was able to palpated in the patient's arm; patient palpated the insert herself. There was minimal blood loss.  Patient insertion site covered with guaze and a pressure bandage to reduce any bruising.  The patient tolerated the procedure well and was given post procedure instructions.  Venora Maples, MD/MPH Family Medicine, [facility] for [employer], [facility]

## 2021-07-09 NOTE — Progress Notes (Signed)
    GYNECOLOGY CLINIC COLPOSCOPY PROCEDURE NOTE  30 y.o. [id number] here for colposcopy for pap finding of:  Lab Results  Component Value Date   DIAGPAP (A) 12/24/2020    - Atypical squamous cells of undetermined significance (ASC-US)   HPVHIGH Positive (A) 12/24/2020    Discussed role for HPV in cervical dysplasia, need for surveillance, nature of the procedure, and risks and benefits.  Pregnancy test: Lab Results  Component Value Date   PREGTESTUR NEGATIVE 07/09/2021    Allergies  Allergen Reactions   Fentanyl Hives    Per patient not allergic to Fentanyl; reaction to "something it was cut with"    Patient given informed consent, signed copy in the chart, time out was performed.    Placed in lithotomy position. Cervix viewed with speculum and colposcope after application of acetic acid.   Colposcopy Adequacy Cervix fully visualized: Yes  SCJ fully visualized: Yes  Colposcopy Findings faint acetowhite lesion(s) noted at 12 and 6 o'clock  Corresponding biopsies were obtained.    ECC specimen was not obtained.  All specimens were labeled and sent to pathology.  Hemostatic measures: Pressure and Monsel's solution  Complications: none  Patient tolerated the procedure well.  OBGyn Exam  Colposcopy Impressions Low grade features  Plan Treatment plan pending biopsy results, per patient preference they will be communicated by telephone.  Patient was given post procedure instructions.  Will follow up pathology and manage accordingly; patient will be contacted with results and recommendations.  Routine preventative health maintenance measures emphasized.  Venora Maples, MD/MPH Attending Family Medicine Physician, [facility] for [facility], [facility]

## 2021-07-12 LAB — SURGICAL PATHOLOGY

## 2021-07-15 ENCOUNTER — Encounter: Payer: Self-pay | Admitting: Oncology

## 2021-07-22 ENCOUNTER — Ambulatory Visit (HOSPITAL_COMMUNITY): Payer: Medicaid Other | Admitting: Licensed Clinical Social Worker

## 2021-07-22 ENCOUNTER — Telehealth (HOSPITAL_COMMUNITY): Payer: Self-pay | Admitting: Licensed Clinical Social Worker

## 2021-07-22 ENCOUNTER — Encounter (HOSPITAL_COMMUNITY): Payer: Self-pay

## 2021-07-22 ENCOUNTER — Other Ambulatory Visit: Payer: Self-pay

## 2021-07-22 NOTE — Telephone Encounter (Signed)
LCSW sent two links to both mobile phone listed. Then f/ with a PC to phone ending in [id number]. VM was left as patient identified herself in VM. This is pt 2nd missed appt. First left w/o being seen. Then rescheduled for today and no showed. Pt to have walk in appointment for next appointment

## 2021-07-28 ENCOUNTER — Ambulatory Visit (HOSPITAL_COMMUNITY): Payer: Medicaid Other | Admitting: Physician Assistant

## 2021-08-16 ENCOUNTER — Telehealth: Payer: Self-pay | Admitting: Family Medicine

## 2021-08-16 DIAGNOSIS — F419 Anxiety disorder, unspecified: Secondary | ICD-10-CM

## 2021-08-16 NOTE — Telephone Encounter (Signed)
Patient want to know if you can increase her medication.

## 2021-08-17 MED ORDER — ATOMOXETINE HCL 40 MG PO CAPS: 40.0000 mg | ORAL_CAPSULE | Freq: Every day | ORAL | 2 refills | Status: DC

## 2021-08-17 MED ORDER — GABAPENTIN 300 MG PO CAPS: 600.0000 mg | ORAL_CAPSULE | Freq: Three times a day (TID) | ORAL | 5 refills | Status: DC

## 2021-08-17 NOTE — Telephone Encounter (Signed)
Patient arrived to front of clinic asking to see me.  Seen in patient room without formal visit. She reports she feels that her anxiety level is high and she would like to increase her gabapentin dose. Currently on 400mg  TID, will increase to 600mg  TID and reassess, she has an actual appointment scheduled for next month.   She also reports strong symptoms of ADHD, diagnosed as a child. Reviewed that given her history of OUD would not recommend doing stimulant medications. Will trial Strattera, follow up at next visit.

## 2021-09-10 ENCOUNTER — Encounter: Payer: Self-pay | Admitting: Oncology

## 2021-09-16 ENCOUNTER — Encounter: Payer: Self-pay | Admitting: *Deleted

## 2021-09-17 ENCOUNTER — Ambulatory Visit: Payer: Self-pay | Admitting: Family Medicine

## 2021-10-11 ENCOUNTER — Telehealth: Payer: Self-pay

## 2021-10-11 NOTE — Telephone Encounter (Signed)
Report from front office staff via secure chat: "hey this pt has an appt on the 30th but she is afraid she will run out of refills before then and wanted to know what to do, she also wanted it noted that she has not relapsed"  Returned call to patient. Pt states she is not currently out of any medications. May run out of Gabapentin prior to appt but is unsure. Encouraged pt to call the office or send MyChart message once she knows how many pills she has left and whether or not this will last until appt. Pt agreeable to plan. Will contact office if refill is needed.

## 2021-10-27 ENCOUNTER — Ambulatory Visit: Payer: Medicaid Other | Admitting: Family Medicine

## 2021-11-08 ENCOUNTER — Telehealth: Payer: Self-pay | Admitting: Family Medicine

## 2021-11-08 NOTE — Telephone Encounter (Signed)
Left message that I am returning her call if she continues to have questions or conerns to please give [place] a call.  Re: Pt needs to be informed that she needs to see her PCP for the refills.  If does not have a PCP pt needs a referral.    Leonette Nutting  11/08/21

## 2021-11-08 NOTE — Telephone Encounter (Signed)
Patient requesting a Refill on Neurotin and Protonix

## 2021-11-24 ENCOUNTER — Emergency Department (HOSPITAL_COMMUNITY)
Admission: EM | Admit: 2021-11-24 | Discharge: 2021-11-24 | Disposition: A | Discharge status: Home / Self Care | Payer: Medicaid Other | Attending: Emergency Medicine | Admitting: Emergency Medicine

## 2021-11-24 ENCOUNTER — Encounter (HOSPITAL_COMMUNITY): Payer: Self-pay | Admitting: *Deleted

## 2021-11-24 DIAGNOSIS — K0889 Other specified disorders of teeth and supporting structures: Secondary | ICD-10-CM

## 2021-11-24 DIAGNOSIS — F17210 Nicotine dependence, cigarettes, uncomplicated: Secondary | ICD-10-CM | POA: Insufficient documentation

## 2021-11-24 DIAGNOSIS — K029 Dental caries, unspecified: Secondary | ICD-10-CM | POA: Insufficient documentation

## 2021-11-24 MED ORDER — LIDOCAINE VISCOUS HCL 2 % MT SOLN: 10.0000 mL | Freq: Three times a day (TID) | OROMUCOSAL | 0 refills | Status: DC | PRN

## 2021-11-24 MED ORDER — OXYCODONE-ACETAMINOPHEN 5-325 MG PO TABS
1.0000 | ORAL_TABLET | Freq: Once | ORAL | Status: AC
  Administered 2021-11-24: 12:00:00 1 via ORAL
  Filled 2021-11-24: qty 1

## 2021-11-24 MED ORDER — ACETAMINOPHEN 500 MG PO TABS: 1000.0000 mg | ORAL_TABLET | Freq: Four times a day (QID) | ORAL | 0 refills | Status: DC | PRN

## 2021-11-24 MED ORDER — CHLORHEXIDINE GLUCONATE 0.12 % MT SOLN: 15.0000 mL | Freq: Two times a day (BID) | OROMUCOSAL | 0 refills | Status: DC

## 2021-11-24 MED ORDER — IBUPROFEN 600 MG PO TABS: 600.0000 mg | ORAL_TABLET | Freq: Four times a day (QID) | ORAL | 0 refills | Status: DC | PRN

## 2021-11-24 NOTE — ED Triage Notes (Addendum)
To ED for eval of back right tooth pain - broke last week. Seen by a dentist and given abx which she is taking. To follow up with oral surgeon in Jan. OTC meds not working.

## 2021-11-24 NOTE — Discharge Instructions (Addendum)
Please follow-up with your dentist.  Please continue taking antibiotics.  I recommend using Orajel around-the-clock and taking 1000 mg of Tylenol every 6 hours and 600 mg of ibuprofen every 6 hours you may alternate between the 2 so you are taking either Tylenol or ibuprofen every 3 hours.  Drink plenty of water.    I have also written you prescription for Peridex which is a mouth rinse to use as prescribed and Magic mouthwash which you will swish and spit

## 2021-11-24 NOTE — ED Provider Notes (Signed)
[facility] EMERGENCY DEPARTMENT Provider Note   CSN: [id number] Arrival date & time: 11/24/21  1020     History   Chief Complaint Chief Complaint  Patient presents with   Dental Pain    HPI Meghan Bullock is a 30 y.o. female  Patient is a 30 year old female with past medical history significant for heroin abuse, bipolar, anemia, anxiety, mononucleosis  She is presented to emergency room today with complaints of right-sided dental pain ongoing for approximately 1 week.  States also has a right sided lower incisor pain after she chipped his tooth approximately a week ago.  She is currently taking antibiotics prescribed by her dentist but states that the Tylenol and ibuprofen she was recommended to take are not helping.  She denies any difficulty opening or closing her mouth difficulty swallowing or sore throat.  She states the pain is 10/10 severe achy constant    HPI  Past Medical History:  Diagnosis Date   Anemia    Anxiety    Bipolar 1 disorder ([place])    Chlamydia    Hepatitis C    Heroin abuse ([place])    Mononucleosis    Ovarian cyst    Polysubstance abuse ([place])    Preterm premature rupture of membranes (PPROM) with unknown onset of labor 05/12/2014    Patient Active Problem List   Diagnosis Date Noted   ASCUS with positive high risk HPV cervical 07/09/2021   Encounter for initial prescription of Nexplanon 07/09/2021   Maternal anemia with delivery, with current postpartum complication 95/62/1308   History of hepatitis C 04/20/2021   Methadone maintenance treatment affecting pregnancy in third trimester ([place]) 03/13/2021   Opioid use disorder, severe, dependence ([place]) 03/11/2021   Malnutrition of moderate degree 03/10/2021   Preeclampsia, severe, third trimester 02/02/2021   Anxiety 02/02/2021   NSVD (normal spontaneous vaginal delivery) 12/24/2020   Abscess of wrist 03/11/2020   Substance use disorder 03/11/2020   Cellulitis and abscess of  hand    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[place])    Iron deficiency anemia 04/30/2018   Substance abuse affecting pregnancy, antepartum 10/25/2017   History of preterm delivery, currently pregnant in second trimester 10/25/2017   Hepatitis C 10/23/2017   Preterm premature rupture of membranes (PPROM) delivered, current hospitalization 05/12/2014   Encephalopathy, toxic 10/03/2013   Benzodiazepine abuse ([place]) 10/03/2013   Heroin abuse ([place])     Past Surgical History:  Procedure Laterality Date   HAND SURGERY     I & D EXTREMITY Left 09/16/2019   Procedure: IRRIGATION AND DEBRIDEMENT EXTREMITY;  Surgeon: Verner Mould, MD;  Location: [facility];  Service: Orthopedics;  Laterality: Left;     OB History     Gravida  4   Para  3   Term  1   Preterm  2   AB  1   Living  3      SAB  1   IAB      Ectopic      Multiple  0   Live Births  3            Home Medications    Prior to Admission medications   Medication Sig Start Date End Date Taking? Authorizing Provider  acetaminophen (TYLENOL) 500 MG tablet Take 2 tablets (1,000 mg total) by mouth every 6 (six) hours as needed for moderate pain. 11/24/21  Yes Janai Maudlin S, PA  chlorhexidine (PERIDEX) 0.12 % solution Use as directed 15 mLs in  the mouth or throat 2 (two) times daily. 11/24/21  Yes Daquawn Seelman S, PA  ibuprofen (ADVIL) 600 MG tablet Take 1 tablet (600 mg total) by mouth every 6 (six) hours as needed for moderate pain. 11/24/21  Yes Tedd Sias, PA  magic mouthwash (lidocaine, diphenhydrAMINE, alum & mag hydroxide) suspension Swish and spit 10 mLs 3 (three) times daily as needed for mouth pain. 11/24/21  Yes Pati Gallo S, PA  atomoxetine (STRATTERA) 40 MG capsule Take 1 capsule (40 mg total) by mouth daily. 08/17/21   Clarnce Flock, MD  Blood Pressure Monitoring (BLOOD PRESSURE KIT) DEVI 1 Device by Does not apply route once a week. Patient not taking: Reported on 07/09/2021 04/13/21   Clarnce Flock,  MD  ferrous sulfate 325 (65 FE) MG EC tablet Take 1 tablet (325 mg total) by mouth every other day. Patient not taking: No sig reported 03/26/21 09/22/21  Clarnce Flock, MD  gabapentin (NEURONTIN) 300 MG capsule Take 2 capsules (600 mg total) by mouth 3 (three) times daily. 08/17/21 09/16/21  Clarnce Flock, MD  lisinopril (ZESTRIL) 10 MG tablet Take 1 tablet (10 mg total) by mouth daily. 04/30/21   Donnamae Jude, MD  methadone (DOLOPHINE) 0.4 mg/mL SOLN Take 135 mg by mouth daily.    [provider]  NIFEdipine (ADALAT CC) 60 MG 24 hr tablet Take 1 tablet (60 mg total) by mouth 2 (two) times daily. 04/23/21   Anyanwu, Sallyanne Havers, MD  pantoprazole (PROTONIX) 20 MG tablet Take 1 tablet (20 mg total) by mouth 2 (two) times daily. 07/09/21   Clarnce Flock, MD    Family History Family History  Problem Relation Age of Onset   COPD Mother    Anxiety disorder Mother    Emphysema Mother     Social History Social History   Tobacco Use   Smoking status: Every Day    Packs/day: 1.50    Years: 5.00    Pack years: 7.50    Types: Cigarettes   Smokeless tobacco: Never  Vaping Use   Vaping Use: Never used  Substance Use Topics   Alcohol use: No   Drug use: Yes    Types: Heroin, Marijuana    Comment: fentanyl-heroin 09/29/2020     Allergies   Fentanyl   Review of Systems Denies fevers, chills, difficulty swallowing or eating, changes in voice, pain under tongue, nausea, vomiting, lightheadedness or dizziness. No trismus   Physical Exam Updated Vital Signs BP (!) 147/114 (BP Location: Right Arm)    Pulse (!) 113    Temp 98.1 F (36.7 C) (Oral)    Resp 16    SpO2 100%   Physical Exam Physical Exam  Constitutional: Pt appears well-developed and well-nourished.  HENT:  Head: Normocephalic.  Right Ear: Tympanic membrane, external ear and ear canal normal.  Left Ear: Tympanic membrane, external ear and ear canal normal.  Nose: Nose normal. Right sinus exhibits no  maxillary sinus tenderness and no frontal sinus tenderness. Left sinus exhibits no maxillary sinus tenderness and no frontal sinus tenderness.  Mouth/Throat: Uvula is midline, oropharynx is clear and moist and mucous membranes are normal. No oral lesions. No uvula swelling or lacerations. No oropharyngeal exudate, posterior oropharyngeal edema, posterior oropharyngeal erythema or tonsillar abscesses.  Poor dentition No gingival swelling, fluctuance or induration No gross abscess  No sublingual edema, tenderness to palpation, or sign of Ludwig's angina, or deep space infection Pain at right lower incisor where she has  chipped tooth/dental decay.  Also has significant erosion of the right lower molar.  No obvious fluctuance or evidence of perirectal abscess.  No sublingual tenderness no trismus. Eyes: Conjunctivae are normal. Pupils are equal, round, and reactive to light. Right eye exhibits no discharge. Left eye exhibits no discharge.  Neck: Normal range of motion. Neck supple.  No stridor Handling secretions without difficulty No nuchal rigidity No cervical lymphadenopathy Cardiovascular: Normal rate, regular rhythm and normal heart sounds.   Pulmonary/Chest: Effort normal. No respiratory distress.  Equal chest rise  Abdominal: Soft. Bowel sounds are normal. Pt exhibits no distension. There is no tenderness.  Lymphadenopathy: Pt has no cervical adenopathy.  Neurological: Pt is alert and oriented x 4  Skin: Skin is warm and dry.  Psychiatric: Pt has a normal mood and affect.  Nursing note and vitals reviewed. Not tachycardic on my exam.  ED Treatments / Results  Labs (all labs ordered are listed, but only abnormal results are displayed) Labs Reviewed - No data to display  EKG    Radiology No results found.  Procedures Procedures (including critical care time)  Medications Ordered in ED Medications  oxyCODONE-acetaminophen (PERCOCET/ROXICET) 5-325 MG per tablet 1 tablet (1  tablet Oral Given 11/24/21 1147)     Initial Impression / Assessment and Plan / ED Course  I have reviewed the triage vital signs and the nursing notes.  Pertinent labs & imaging results that were available during my care of the patient were reviewed by me and considered in my medical decision making (see chart for details).        Patient with dentalgia.  No abscess requiring immediate incision and drainage.  Exam not concerning for Ludwig's angina or pharyngeal abscess.  Will treat with Magic mouthwash swish and spit, Peridex, ibuprofen Tylenol warm compresses and Orajel.. Pt instructed to follow-up with dentist.  Discussed return precautions. Pt safe for discharge.  She is already scheduled for an appointment with oral surgeon on Monday next week.  Will recommend she continue taking the Keflex that she is prescribed.  She showed me his prescription and she is taking it appropriately.  Return precautions given.  She is understanding of plan.  Final Clinical Impressions(s) / ED Diagnoses   Final diagnoses:  Pain, dental    ED Discharge Orders          Ordered    magic mouthwash (lidocaine, diphenhydrAMINE, alum & mag hydroxide) suspension  3 times daily PRN        11/24/21 1142    chlorhexidine (PERIDEX) 0.12 % solution  2 times daily        11/24/21 1142    ibuprofen (ADVIL) 600 MG tablet  Every 6 hours PRN        11/24/21 1157    acetaminophen (TYLENOL) 500 MG tablet  Every 6 hours PRN        11/24/21 1157              Tedd Sias, [place] 80/99/83 3825    Lianne Cure, DO 05/39/76 2120

## 2021-12-03 ENCOUNTER — Telehealth: Payer: Self-pay | Admitting: Family Medicine

## 2021-12-03 ENCOUNTER — Other Ambulatory Visit: Payer: Self-pay | Admitting: Family Medicine

## 2021-12-03 DIAGNOSIS — F419 Anxiety disorder, unspecified: Secondary | ICD-10-CM

## 2021-12-03 MED ORDER — GABAPENTIN 300 MG PO CAPS: 600.0000 mg | ORAL_CAPSULE | Freq: Three times a day (TID) | ORAL | 0 refills | Status: DC

## 2021-12-03 NOTE — Telephone Encounter (Signed)
Patient called stating she was told her gabapentin refill would be at her [place] for her, when she arrived there she was told there was no refill available. I informed the patient I could send a message out to a nurse to look into this and she said she would like that and she will be coming into the office as well shortly to wait for the refill.

## 2021-12-03 NOTE — Telephone Encounter (Signed)
Pt presented to office @ [phone] stating that she was "in the area".  I was asked by Vanessa Swaz[place]and to review pt request for refill. Per chart review, pt presented to office on 08/17/21 without appointment and asked to see Dr. Crissie Reese. She requested a dose increase of Gabapentin which was given by Dr. Crissie Reese.  His note reflects plan to follow up @ next appt. Pt DNKA on 09/17/21 and again on 10/27/21. I advised Erie Noe that I will send in a small refill of medication to last until follow up appt next week. Pt was advised of this information and appointment was scheduled on 12/07/21.

## 2021-12-07 ENCOUNTER — Ambulatory Visit (INDEPENDENT_AMBULATORY_CARE_PROVIDER_SITE_OTHER): Payer: Medicaid Other | Admitting: Family Medicine

## 2021-12-07 ENCOUNTER — Encounter: Payer: Self-pay | Admitting: Family Medicine

## 2021-12-07 ENCOUNTER — Other Ambulatory Visit: Payer: Self-pay

## 2021-12-07 VITALS — BP 135/91 | HR 83 | Wt 193.3 lb

## 2021-12-07 DIAGNOSIS — K2100 Gastro-esophageal reflux disease with esophagitis, without bleeding: Secondary | ICD-10-CM

## 2021-12-07 DIAGNOSIS — I10 Essential (primary) hypertension: Secondary | ICD-10-CM | POA: Diagnosis not present

## 2021-12-07 DIAGNOSIS — F419 Anxiety disorder, unspecified: Secondary | ICD-10-CM

## 2021-12-07 MED ORDER — GABAPENTIN 300 MG PO CAPS: 600.0000 mg | ORAL_CAPSULE | Freq: Three times a day (TID) | ORAL | 1 refills | Status: DC

## 2021-12-07 MED ORDER — PANTOPRAZOLE SODIUM 20 MG PO TBEC: 20.0000 mg | DELAYED_RELEASE_TABLET | Freq: Two times a day (BID) | ORAL | 5 refills | Status: DC

## 2021-12-07 MED ORDER — PROPRANOLOL HCL 20 MG PO TABS: 20.0000 mg | ORAL_TABLET | Freq: Three times a day (TID) | ORAL | 2 refills | Status: DC

## 2021-12-07 NOTE — Progress Notes (Signed)
Patient in for medication refill. Says she feels the Gabapentin has been helping but she is still having an increase in anxiety especially when she is in public. Is dealing with a lot of stressors. Would like to talk to provider to see if there is anything in addition to Gabapentin she can take.   Wynona Canes, CMA

## 2021-12-07 NOTE — Assessment & Plan Note (Signed)
Refill given for gabapentin. Discussed that benzodiazapenes have addictive potential and that given her hx of substance use disorders I strongly recommend against using them. Also stressed importance of trying to move away from medications as a way to treat all problems--much better to engage in CBT or some other form of therapy and shift her perspective. She does seem to be having some social anxiety though, not unreasonable to trial propranolol.

## 2021-12-07 NOTE — Progress Notes (Signed)
GYNECOLOGY OFFICE VISIT NOTE  History:   Meghan Bullock is a 30 y.o. [phone] here today for medication follow up.  Last seen on 08/16/21 when she arrived in clinic without appt, given refill of gabapentin at that time Also asked for ADHD meds, recommended she establish care with [facility] services across the street  Today reports she needs a refill on her gabapentin, feels that it is helpful But has been having more anxiety Especially when going out in public Tried one of her uncle's valiums and felt it was very effective for her anxiety  Health Maintenance Due  Topic Date Due   COVID-19 Vaccine (1) Never done   Pneumococcal Vaccine 54-68 Years old (1 - PCV) Never done   INFLUENZA VACCINE  06/28/2021    Past Medical History:  Diagnosis Date   Anemia    Anxiety    Bipolar 1 disorder ([facility])    Chlamydia    Hepatitis C    Heroin abuse ([facility])    Mononucleosis    Ovarian cyst    Polysubstance abuse ([facility])    Preterm premature rupture of membranes (PPROM) with unknown onset of labor 05/12/2014    Past Surgical History:  Procedure Laterality Date   HAND SURGERY     I & D EXTREMITY Left 09/16/2019   Procedure: IRRIGATION AND DEBRIDEMENT EXTREMITY;  Surgeon: Ernest Mallick, MD;  [facility];  Service: Orthopedics;  Laterality: Left;    The following portions of the patient's history were reviewed and updated as appropriate: allergies, current medications, past family history, past medical history, past social history, past surgical history and problem list.   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  Physical Exam:  BP (!) 135/91     Pulse 83    Wt 193 lb 4.8 oz (87.7 kg)    Breastfeeding No    BMI 33.18 kg/m  CONSTITUTIONAL: Well-developed, well-nourished female in no acute distress.  HEENT:  Normocephalic, atraumatic. External right and left ear normal. No scleral icterus.  NECK: Normal range of motion, supple, no masses noted on observation SKIN: No rash noted. Not diaphoretic. No erythema. No pallor. MUSCULOSKELETAL: Normal range of motion. No edema noted. NEUROLOGIC: Alert and oriented to person, place, and time. Normal muscle tone coordination.  PSYCHIATRIC: Normal mood and affect. Normal behavior. Normal judgment and thought content. RESPIRATORY: Effort normal, no problems with respiration noted   Labs and Imaging No results found for this or any previous visit (from the past 168 hour(s)). No results found.    Assessment and Plan:   Problem List Items Addressed This Visit       Other   Anxiety - Primary    Refill given for gabapentin. Discussed that benzodiazapenes have addictive potential and that given her hx of substance use disorders I strongly recommend against using them. Also stressed importance of trying to move away from medications as a way to treat all problems--much better to engage in CBT or some other form of therapy and shift her perspective. She does seem to be having some social anxiety though, not unreasonable to trial propranolol.  Relevant Medications   gabapentin (NEURONTIN) 300 MG capsule   propranolol (INDERAL) 20 MG tablet   Other Visit Diagnoses     Hypertension, unspecified type       Relevant Medications   propranolol (INDERAL) 20 MG tablet   pantoprazole (PROTONIX) 20 MG tablet   Gastroesophageal reflux disease with esophagitis without hemorrhage       Relevant Medications   pantoprazole (PROTONIX) 20 MG tablet       Routine preventative health maintenance measures emphasized. Please refer to After Visit Summary for other counseling recommendations.   Return  in about 2 months (around 02/04/2022) for anxiety follow up.    Total face-to-face time with patient: 20 minutes.  Over 50% of encounter was spent on counseling and coordination of care.   Venora Maples, MD/MPH Attending Family Medicine Physician, [facility] for [facility], [facility]

## 2021-12-19 ENCOUNTER — Other Ambulatory Visit: Payer: Self-pay | Admitting: Family Medicine

## 2021-12-19 DIAGNOSIS — I10 Essential (primary) hypertension: Secondary | ICD-10-CM

## 2021-12-19 DIAGNOSIS — K2100 Gastro-esophageal reflux disease with esophagitis, without bleeding: Secondary | ICD-10-CM

## 2022-01-31 ENCOUNTER — Other Ambulatory Visit: Payer: Self-pay | Admitting: Family Medicine

## 2022-02-03 ENCOUNTER — Telehealth: Payer: Self-pay | Admitting: *Deleted

## 2022-02-03 NOTE — Telephone Encounter (Signed)
Pt called office @ 2:25 pm and spoke with [facility]. She stated that she is having trouble getting refill of Gabapentin from [facility] on [facility] [facility]. Pt was advised that a nurse would research and call her back. ? ?Per chart review, most [facility] [address] on 12/07/21 for #540 capsules (90 Re[place] supply) and 1 refill.  [facility] and confirmed that [facility] #540 capsules on 12/07/21. Pt then obtained #180 capsules on 01/18/22 for  which she paid out of pocket. Pt has #360 capsules remaining on the refill and is eligible to obtain them on 3/21.  ? ?I called pt @ 1505 to discuss these details. Pt states that she had given half of her pills from Rx obtained on 1/10 to her grandmother to hold for her and they disappeared. Pt also stated that she does not know anything about partial refill obtained on 2/21 and that it was definitely not her. She suspects a family member posed as her in order to get the medication. I advised pt that she is not eligible for the remaining #360 capsules [facility] until 3/21. She should discuss with Dr. Crissie Reese during her appt tomorrow. Pt voiced understanding and had no additional questions.  ?

## 2022-02-04 ENCOUNTER — Ambulatory Visit (INDEPENDENT_AMBULATORY_CARE_PROVIDER_SITE_OTHER): Payer: Medicaid Other | Admitting: Family Medicine

## 2022-02-04 ENCOUNTER — Other Ambulatory Visit: Payer: Self-pay

## 2022-02-04 ENCOUNTER — Encounter: Payer: Self-pay | Admitting: Family Medicine

## 2022-02-04 VITALS — BP 138/82 | HR 114 | Wt 211.1 lb

## 2022-02-04 DIAGNOSIS — I10 Essential (primary) hypertension: Secondary | ICD-10-CM | POA: Diagnosis not present

## 2022-02-04 DIAGNOSIS — D509 Iron deficiency anemia, unspecified: Secondary | ICD-10-CM | POA: Diagnosis not present

## 2022-02-04 DIAGNOSIS — Z0000 Encounter for general adult medical examination without abnormal findings: Secondary | ICD-10-CM

## 2022-02-04 DIAGNOSIS — K2100 Gastro-esophageal reflux disease with esophagitis, without bleeding: Secondary | ICD-10-CM

## 2022-02-04 DIAGNOSIS — F17200 Nicotine dependence, unspecified, uncomplicated: Secondary | ICD-10-CM

## 2022-02-04 DIAGNOSIS — F419 Anxiety disorder, unspecified: Secondary | ICD-10-CM

## 2022-02-04 LAB — POCT URINALYSIS DIP (DEVICE)
Bilirubin Urine: NEGATIVE
Glucose, UA: NEGATIVE mg/dL
Hgb urine dipstick: NEGATIVE
Ketones, ur: NEGATIVE mg/dL
Leukocytes,Ua: NEGATIVE
Nitrite: NEGATIVE
Protein, ur: NEGATIVE mg/dL
Specific Gravity, Urine: >=1.03 (ref 1.005–1.030)
Urobilinogen, UA: 0.2 mg/dL (ref 0.0–1.0)
pH: 5.5 (ref 5.0–8.0)

## 2022-02-04 MED ORDER — FERROUS SULFATE 325 (65 FE) MG PO TBEC: 325.0000 mg | DELAYED_RELEASE_TABLET | ORAL | 2 refills | Status: DC

## 2022-02-04 MED ORDER — GABAPENTIN 300 MG PO CAPS: 600.0000 mg | ORAL_CAPSULE | Freq: Three times a day (TID) | ORAL | 5 refills | Status: DC

## 2022-02-04 MED ORDER — NICOTINE 21-14-7 MG/24HR TD KIT: 1.0000 | PACK | TRANSDERMAL | 0 refills | Status: DC

## 2022-02-04 MED ORDER — PANTOPRAZOLE SODIUM 40 MG PO TBEC: 40.0000 mg | DELAYED_RELEASE_TABLET | Freq: Two times a day (BID) | ORAL | 5 refills | Status: DC

## 2022-02-04 MED ORDER — CYCLOBENZAPRINE HCL 10 MG PO TABS: 10.0000 mg | ORAL_TABLET | Freq: Three times a day (TID) | ORAL | 1 refills | Status: DC | PRN

## 2022-02-04 MED ORDER — NICOTINE POLACRILEX 2 MG MT GUM: 2.0000 mg | CHEWING_GUM | OROMUCOSAL | 2 refills | Status: DC | PRN

## 2022-02-04 NOTE — Assessment & Plan Note (Addendum)
Long hx of anemia, will check CBC today and start on PO iron. Leg spasms may be restless leg in the setting of anemia, ok to trial some flexeril. Given non-responsive GERD also concerning for possible GI bleeding, referred to GI for evaluation and possible EGD.  ?

## 2022-02-04 NOTE — Assessment & Plan Note (Signed)
Rx sent for nicotine patches and gum.  ?

## 2022-02-04 NOTE — Progress Notes (Signed)
? ?GYNECOLOGY OFFICE VISIT NOTE ? ?History:  ? Meghan Bullock is a 31 y.o. [phone] here today for medication management. ? ?Last seen 12/07/2021, struggling with anxiety at that time ?Felt that gabapentin was effective, also trial of propranolol ?Referred to [facility] across the street ? ?Today reports she has gained a lot of weight and its causing her pains in her legs ?Also having lots of muscle spasms, wondering about a muscle relaxer ?Sister has been stealing her gabapentin ?Also worried she picked up her rx from the pharmacy without her knowledge ?Feels really anxious without it ?Also still having heartburn despite taking protonix 7m BID ?Trying to quit smoking, down to 10 cig/day and wanting to use NRT ? ? ?Health Maintenance Due  ?Topic Date Due  ? COVID-19 Vaccine (1) Never done  ? INFLUENZA VACCINE  06/28/2021  ? ? ?Past Medical History:  ?Diagnosis Date  ? Anemia   ? Anxiety   ? B[place] 1 disorder ([place]   ? Chlamydia   ? Hepatitis C   ? Heroin abuse ([place]   ? Mononucleosis   ? Ovarian cyst   ? Polysubstance abuse ([place]   ? Preterm premature rupture of membranes (PPROM) with unknown onset of labor 05/12/2014  ? ? ?Past Surgical History:  ?Procedure Laterality Date  ? HAND SURGERY    ? I & D EXTREMITY Left 09/16/2019  ? Procedure: IRRIGATION AND DEBRIDEMENT EXTREMITY;  Surgeon: CVerner Mould MD;  [facility]  Service: Orthopedics;  Laterality: Left;  ? ? ?The following portions of the patient's history were reviewed and updated as appropriate: allergies, current medications, past family history, past medical history, past social history, past surgical history and problem list.  ? ?Health Maintenance:   ?Last pap: ?Lab Results  ?Component Value Date  ? DIAGPAP (A) 12/24/2020  ?  - Atypical squamous cells of undetermined significance (ASC-US)  ? H[place]Positive (A) 12/24/2020  ? 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 ?Last mammogram:  ?N/a  ? ? ?Review of Systems:  ?Pertinent  items noted in HPI and remainder of comprehensive ROS otherwise negative. ? ?Physical Exam:  ?BP 138/82   Pulse (!) 114   Wt 211 lb 1.6 oz (95.8 kg)   BMI 36.24 kg/m?  ?CONSTITUTIONAL: Well-developed, well-nourished female in no acute distress.  ?HEENT:  Normocephalic, atraumatic. External right and left ear normal. No scleral icterus.  ?NECK: Normal range of motion, supple, no masses noted on observation ?SKIN: No rash noted. Not diaphoretic. No erythema. No pallor. ?MUSCULOSKELETAL: Normal range of motion. No edema noted. ?NEUROLOGIC: Alert and oriented to person, place, and time. Normal muscle tone coordination.  ?PSYCHIATRIC: Normal mood and affect. Normal behavior. Normal judgment and thought content. ?RESPIRATORY: Effort normal, no problems with respiration noted ? ?Labs and Imaging ?Results for orders placed or performed in visit on 02/04/22 (from the past 168 hour(s))  ?POCT urinalysis dip (device)  ? Collection Time: 02/04/22  9:36 AM  ?Result Value Ref Range  ? Glucose, UA NEGATIVE NEGATIVE mg/dL  ? Bilirubin Urine NEGATIVE NEGATIVE  ? Ketones, ur NEGATIVE NEGATIVE mg/dL  ? Specific Gravity, Urine >=1.030 1.005 - 1.030  ? Hgb urine dipstick NEGATIVE NEGATIVE  ? pH 5.5 5.0 - 8.0  ? Protein, ur NEGATIVE NEGATIVE mg/dL  ? Urobilinogen, UA 0.2 0.0 - 1.0 mg/dL  ? Nitrite NEGATIVE NEGATIVE  ? Leukocytes,Ua NEGATIVE NEGATIVE  ? ?No results found.    ?Assessment and Plan:  ? ?Problem List Items  Addressed This Visit   ? ?  ? Other  ? Iron deficiency anemia  ?  Long hx of anemia, will check CBC today and start on PO iron. Leg spasms may be restless leg in the setting of anemia, ok to trial some flexeril. Given non-responsive GERD also concerning for possible GI bleeding, referred to GI for evaluation and possible EGD.  ?  ?  ? Relevant Medications  ? ferrous sulfate 325 (65 FE) MG EC tablet  ? cyclobenzaprine (FLEXERIL) 10 MG tablet  ? Other Relevant Orders  ? CBC  ? Anxiety - Primary  ?  Refill sent for  gabapentin. Again referred to [facility] across the street.  ?  ?  ? Relevant Medications  ? gabapentin (NEURONTIN) 300 MG capsule  ? Other Relevant Orders  ? Ambulatory referral to Internal Medicine  ? Tobacco use disorder  ?  Rx sent for nicotine patches and gum.  ?  ?  ? Relevant Medications  ? Nicotine 21-14-7 MG/24HR KIT  ? nicotine polacrilex (NICORETTE) 2 MG gum  ? ?Other Visit Diagnoses   ? ? Healthcare maintenance      ? Relevant Orders  ? Ambulatory referral to Internal Medicine  ? POCT urinalysis dip (device) (Completed)  ? Hypertension, unspecified type      ? Gastroesophageal reflux disease with esophagitis without hemorrhage      ? Relevant Medications  ? pantoprazole (PROTONIX) 40 MG tablet  ? Other Relevant Orders  ? Ambulatory referral to Gastroenterology  ? ?  ? ? ?Routine preventative health maintenance measures emphasized. ?Please refer to After Visit Summary for other counseling recommendations.  ? ?Return in about 2 months (around 04/06/2022) for routine follow up.   ? ?Total face-to-face time with patient: 20 minutes.  Over 50% of encounter was spent on counseling and coordination of care. ? ? ?Clarnce Flock, MD/MPH ?Attending Family Medicine Physician, [facility] ?[facility] ? ?

## 2022-02-04 NOTE — Assessment & Plan Note (Signed)
Refill sent for gabapentin. Again referred to [facility] across the street.  ?

## 2022-02-05 LAB — CBC
Hematocrit: 35.9 % (ref 34.0–46.6)
Hemoglobin: 11.2 g/dL (ref 11.1–15.9)
MCH: 24.6 pg — ABNORMAL LOW (ref 26.6–33.0)
MCHC: 31.2 g/dL — ABNORMAL LOW (ref 31.5–35.7)
MCV: 79 fL (ref 79–97)
Platelets: 336 10*3/uL (ref 150–450)
RBC: 4.55 x10E6/uL (ref 3.77–5.28)
RDW: 14.9 % (ref 11.7–15.4)
WBC: 7.5 10*3/uL (ref 3.4–10.8)

## 2022-02-14 ENCOUNTER — Telehealth: Payer: Self-pay | Admitting: Family Medicine

## 2022-02-14 NOTE — Telephone Encounter (Signed)
Patient wants her medications transferred from [facility] [address] [facility] [address]. Pharmacy stated that provider had to transfer them. Patient would like a call back. ?

## 2022-02-15 NOTE — Telephone Encounter (Addendum)
[facility]. Staff member states all prescriptions are available for pick up.  ? ?Will call patient to clarify concerns. VM left stating [facility]. Encouraged pt to call back with any other concerns. ?

## 2022-02-16 ENCOUNTER — Encounter: Payer: Self-pay | Admitting: Family Medicine

## 2022-02-16 ENCOUNTER — Other Ambulatory Visit: Payer: Self-pay | Admitting: Family Medicine

## 2022-02-16 DIAGNOSIS — F419 Anxiety disorder, unspecified: Secondary | ICD-10-CM

## 2022-02-16 MED ORDER — GABAPENTIN 300 MG PO CAPS: 600.0000 mg | ORAL_CAPSULE | Freq: Three times a day (TID) | ORAL | 5 refills | Status: DC
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
## 2022-03-23 ENCOUNTER — Other Ambulatory Visit: Payer: Self-pay | Admitting: Family Medicine

## 2022-03-23 DIAGNOSIS — D509 Iron deficiency anemia, unspecified: Secondary | ICD-10-CM

## 2022-04-06 ENCOUNTER — Ambulatory Visit: Payer: Medicaid Other | Admitting: Family Medicine

## 2022-04-13 ENCOUNTER — Encounter: Payer: Self-pay | Admitting: Family Medicine

## 2022-04-13 ENCOUNTER — Ambulatory Visit (INDEPENDENT_AMBULATORY_CARE_PROVIDER_SITE_OTHER): Payer: Managed Care, Other (non HMO) | Admitting: Family Medicine

## 2022-04-13 VITALS — BP 131/94 | HR 72 | Wt 217.9 lb

## 2022-04-13 DIAGNOSIS — F419 Anxiety disorder, unspecified: Secondary | ICD-10-CM

## 2022-04-13 DIAGNOSIS — G8929 Other chronic pain: Secondary | ICD-10-CM | POA: Diagnosis not present

## 2022-04-13 DIAGNOSIS — G4700 Insomnia, unspecified: Secondary | ICD-10-CM

## 2022-04-13 DIAGNOSIS — R69 Illness, unspecified: Secondary | ICD-10-CM | POA: Diagnosis not present

## 2022-04-13 DIAGNOSIS — F17200 Nicotine dependence, unspecified, uncomplicated: Secondary | ICD-10-CM

## 2022-04-13 DIAGNOSIS — G2581 Restless legs syndrome: Secondary | ICD-10-CM

## 2022-04-13 DIAGNOSIS — D509 Iron deficiency anemia, unspecified: Secondary | ICD-10-CM | POA: Diagnosis not present

## 2022-04-13 DIAGNOSIS — M545 Low back pain, unspecified: Secondary | ICD-10-CM

## 2022-04-13 DIAGNOSIS — F1120 Opioid dependence, uncomplicated: Secondary | ICD-10-CM

## 2022-04-13 MED ORDER — TRAZODONE HCL 50 MG PO TABS: 50.0000 mg | ORAL_TABLET | Freq: Every day | ORAL | 5 refills | Status: DC

## 2022-04-13 MED ORDER — CYCLOBENZAPRINE HCL 10 MG PO TABS: 10.0000 mg | ORAL_TABLET | Freq: Three times a day (TID) | ORAL | 2 refills | Status: DC | PRN

## 2022-04-13 MED ORDER — NICOTINE 21-14-7 MG/24HR TD KIT: 1.0000 | PACK | TRANSDERMAL | 0 refills | Status: DC

## 2022-04-13 NOTE — Progress Notes (Signed)
GYNECOLOGY OFFICE VISIT NOTE  History:   Meghan Bullock is a 31 y.o. [phone] here today for follow up.  Reports that her leg cramps and restlessness have not improved at all Feels like flexeril helps, also useful for sleep Has tried trazodone in the past but it really knocked her out Her CBC was normal at last check Open to seeing neurology for this  Has ongoing low back pain, has been an issue for many years Has never done PT  Needs refill on nicotine patches  She is wondering about switching from methadone to suboxone Does not like going to the clinic, also worried about dealers who hang out in the parking lot that she has to walk by Wondering how to go about this  Health Maintenance Due  Topic Date Due   COVID-19 Vaccine (1) Never done    Past Medical History:  Diagnosis Date   Anemia    Anxiety    Bipolar 1 disorder (HCC)    Chlamydia    Hepatitis C    Heroin abuse ([place])    Mononucleosis    Ovarian cyst    Polysubstance abuse ([place])    Preterm premature rupture of membranes (PPROM) with unknown onset of labor 05/12/2014    Past Surgical History:  Procedure Laterality Date   HAND SURGERY     I & D EXTREMITY Left 09/16/2019   Procedure: IRRIGATION AND DEBRIDEMENT EXTREMITY;  Surgeon: Verner Mould, MD;  Location: [facility];  Service: Orthopedics;  Laterality: Left;    The following portions of the patient's history were reviewed and updated as appropriate: allergies, current medications, past family history, past medical history, past social history, past surgical history and problem list.   Health Maintenance:   Last pap: Lab Results  Component Value Date   DIAGPAP (A) 12/24/2020    - Atypical squamous cells of undetermined significance (ASC-US)   HPVHIGH Positive (A) 12/24/2020   Colpo CIN I in 06/2021, repeat pap 06/2022  Last mammogram:  N/a    Review of Systems:  Pertinent items noted in HPI and remainder of comprehensive ROS otherwise  negative.  Physical Exam:  BP (!) 131/94   Pulse 72   Wt 217 lb 14.4 oz (98.8 kg)   LMP  (LMP Unknown)   Breastfeeding No   BMI 37.40 kg/m  CONSTITUTIONAL: Well-developed, well-nourished female in no acute distress.  HEENT:  Normocephalic, atraumatic. External right and left ear normal. No scleral icterus.  NECK: Normal range of motion, supple, no masses noted on observation SKIN: No rash noted. Not diaphoretic. No erythema. No pallor. MUSCULOSKELETAL: Normal range of motion. No edema noted. NEUROLOGIC: Alert and oriented to person, place, and time. Normal muscle tone coordination.  PSYCHIATRIC: Normal mood and affect. Normal behavior. Normal judgment and thought content. RESPIRATORY: Effort normal, no problems with respiration noted   Labs and Imaging No results found for this or any previous visit (from the past 168 hour(s)). No results found.    Assessment and Plan:   Problem List Items Addressed This Visit       Other   Iron deficiency anemia   Relevant Medications   cyclobenzaprine (FLEXERIL) 10 MG tablet   Anxiety    Has not yet seen [facility] services despite me bugging her to do so with every visit including this one. Trial trazodone for mild insomnia, may also help with anxiety.        Relevant Medications   traZODone (DESYREL) 50 MG tablet  Methadone maintenance therapy patient [facility])    Doing great but interested in exploring a switch to suboxone. I recommended she try to taper down as much as possible and I will do some research on the best way to do this without a prolonged period of withdrawal.       Tobacco use disorder    Refill sent for patches       Relevant Medications   Nicotine 21-14-7 MG/24HR KIT   Restless leg syndrome    Symptoms suspicious for this, already on gabapentin which should theoretically help, will refer to Neurology for evaluation. Refill sent for flexeril.       Relevant Orders   Ambulatory referral to Neurology   Chronic  bilateral low back pain without sciatica - Primary    Trial PT, consider adjunctive therapies after this       Relevant Medications   cyclobenzaprine (FLEXERIL) 10 MG tablet   traZODone (DESYREL) 50 MG tablet   Other Relevant Orders   Ambulatory referral to Physical Therapy   Other Visit Diagnoses     Insomnia, unspecified type       Relevant Medications   traZODone (DESYREL) 50 MG tablet       Routine preventative health maintenance measures emphasized. Please refer to After Visit Summary for other counseling recommendations.   Return in about 2 months (around 06/13/2022) for follow up suboxone transition.    Total face-to-face time with patient: 25 minutes.  Over 50% of encounter was spent on counseling and coordination of care.   Clarnce Flock, MD/MPH Attending Family Medicine Physician, [facility] for [facility], [place]

## 2022-04-14 NOTE — Assessment & Plan Note (Signed)
Has not yet seen [facility] services despite me bugging her to do so with every visit including this one. Trial trazodone for mild insomnia, may also help with anxiety.

## 2022-04-14 NOTE — Assessment & Plan Note (Signed)
Doing great but interested in exploring a switch to suboxone. I recommended she try to taper down as much as possible and I will do some research on the best way to do this without a prolonged period of withdrawal.

## 2022-04-14 NOTE — Assessment & Plan Note (Signed)
Trial PT, consider adjunctive therapies after this

## 2022-04-14 NOTE — Assessment & Plan Note (Addendum)
Symptoms suspicious for this, already on gabapentin which should theoretically help, will refer to Neurology for evaluation. Refill sent for flexeril.

## 2022-04-14 NOTE — Assessment & Plan Note (Signed)
Refill sent for patches

## 2022-04-19 ENCOUNTER — Encounter: Payer: Self-pay | Admitting: Neurology

## 2022-05-05 ENCOUNTER — Other Ambulatory Visit: Payer: Self-pay | Admitting: Family Medicine

## 2022-05-05 DIAGNOSIS — G4700 Insomnia, unspecified: Secondary | ICD-10-CM

## 2022-05-16 LAB — FETAL NONSTRESS TEST

## 2022-05-21 ENCOUNTER — Emergency Department (HOSPITAL_COMMUNITY)
Admission: EM | Admit: 2022-05-21 | Discharge: 2022-05-21 | Disposition: A | Discharge status: Home / Self Care | Payer: Medicaid Other | Attending: Emergency Medicine | Admitting: Emergency Medicine

## 2022-05-21 ENCOUNTER — Other Ambulatory Visit: Payer: Self-pay

## 2022-05-21 DIAGNOSIS — F1120 Opioid dependence, uncomplicated: Secondary | ICD-10-CM | POA: Diagnosis not present

## 2022-05-21 DIAGNOSIS — F1190 Opioid use, unspecified, uncomplicated: Secondary | ICD-10-CM | POA: Diagnosis present

## 2022-05-21 MED ORDER — METHADONE HCL 10 MG PO TABS
160.0000 mg | ORAL_TABLET | Freq: Once | ORAL | Status: AC
  Administered 2022-05-21: 160 mg via ORAL
  Filled 2022-05-21: qty 16
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
## 2022-06-20 ENCOUNTER — Telehealth: Payer: Self-pay | Admitting: Family Medicine

## 2022-06-20 DIAGNOSIS — D509 Iron deficiency anemia, unspecified: Secondary | ICD-10-CM

## 2022-06-20 NOTE — Telephone Encounter (Signed)
Need a Refill on Flexeril

## 2022-06-21 ENCOUNTER — Ambulatory Visit: Payer: Medicaid Other | Admitting: Family Medicine

## 2022-06-21 MED ORDER — CYCLOBENZAPRINE HCL 10 MG PO TABS: 10.0000 mg | ORAL_TABLET | Freq: Three times a day (TID) | ORAL | 2 refills | Status: DC | PRN

## 2022-06-21 NOTE — Telephone Encounter (Signed)
Done, please let her know

## 2022-06-22 ENCOUNTER — Other Ambulatory Visit: Payer: Self-pay

## 2022-06-22 ENCOUNTER — Ambulatory Visit (INDEPENDENT_AMBULATORY_CARE_PROVIDER_SITE_OTHER): Payer: Medicaid Other | Admitting: Family Medicine

## 2022-06-22 ENCOUNTER — Encounter: Payer: Self-pay | Admitting: Family Medicine

## 2022-06-22 VITALS — BP 141/92 | HR 107 | Wt 227.9 lb

## 2022-06-22 DIAGNOSIS — R87810 Cervical high risk human papillomavirus (HPV) DNA test positive: Secondary | ICD-10-CM

## 2022-06-22 DIAGNOSIS — F1120 Opioid dependence, uncomplicated: Secondary | ICD-10-CM

## 2022-06-22 DIAGNOSIS — F1191 Opioid use, unspecified, in remission: Secondary | ICD-10-CM

## 2022-06-22 DIAGNOSIS — F17200 Nicotine dependence, unspecified, uncomplicated: Secondary | ICD-10-CM

## 2022-06-22 DIAGNOSIS — R87610 Atypical squamous cells of undetermined significance on cytologic smear of cervix (ASC-US): Secondary | ICD-10-CM

## 2022-06-22 MED ORDER — NICOTINE 14 MG/24HR TD PT24: 14.0000 mg | MEDICATED_PATCH | Freq: Every day | TRANSDERMAL | 0 refills | Status: DC

## 2022-06-22 NOTE — Progress Notes (Signed)
GYNECOLOGY OFFICE VISIT NOTE  History:   Meghan Bullock is a 31 y.o. [phone] here today for follow up.  Doing well overall Here with her three rambunctious boys  Down to half a pack a day Would like nicotine patch refill  Still has not gone to [facility] across the street Did not go to PT  Feels good overall Still on 170mg  of methadone Was thinking about switching to suboxone at last visit Now thinking about weaning down   Health Maintenance Due  Topic Date Due   COVID-19 Vaccine (1) Never done    Past Medical History:  Diagnosis Date   Anemia    Anxiety    Bipolar 1 disorder (HCC)    Chlamydia    Hepatitis C    Heroin abuse (HCC)    Mononucleosis    Ovarian cyst    Polysubstance abuse (HCC)    Preterm premature rupture of membranes (PPROM) with unknown onset of labor 05/12/2014    Past Surgical History:  Procedure Laterality Date   HAND SURGERY     I & D EXTREMITY Left 09/16/2019   Procedure: IRRIGATION AND DEBRIDEMENT EXTREMITY;  Surgeon: 09/18/2019, MD;  Location: [facility] OR;  Service: Orthopedics;  Laterality: Left;    The following portions of the patient's history were reviewed and updated as appropriate: allergies, current medications, past family history, past medical history, past social history, past surgical history and problem list.   Health Maintenance:   Last pap: Lab Results  Component Value Date   DIAGPAP (A) 12/24/2020    - Atypical squamous cells of undetermined significance (ASC-US)   HPVHIGH Positive (A) 12/24/2020   Colpo CIN I in 06/2021, repeat pap 06/2022  Last mammogram:  N/a    Review of Systems:  Pertinent items noted in HPI and remainder of comprehensive ROS otherwise negative.  Physical Exam:  BP (!) 141/92   Pulse (!) 107   Wt 227 lb 14.4 oz (103.4 kg)   LMP  (LMP Unknown)   Breastfeeding No   BMI 39.12 kg/m  CONSTITUTIONAL: Well-developed, well-nourished female in no acute distress.  HEENT:  Normocephalic,  atraumatic. External right and left ear normal. No scleral icterus.  NECK: Normal range of motion, supple, no masses noted on observation SKIN: No rash noted. Not diaphoretic. No erythema. No pallor. MUSCULOSKELETAL: Normal range of motion. No edema noted. NEUROLOGIC: Alert and oriented to person, place, and time. Normal muscle tone coordination.  PSYCHIATRIC: Normal mood and affect. Normal behavior. Normal judgment and thought content. RESPIRATORY: Effort normal, no problems with respiration noted   Labs and Imaging No results found for this or any previous visit (from the past 168 hour(s)). No results found.    Assessment and Plan:   Problem List Items Addressed This Visit       Other   Opioid use disorder in remission    Stable on methadone. Would not recommend going off but would take a long time at her current dose, can discuss further with doctor at [facility].       Methadone maintenance therapy patient [facility]) - Primary   Relevant Orders   ToxASSURE Select 13 (MW), Urine   ASCUS with positive high risk HPV cervical    Noted after visit to need one year follow up pap Will get her scheduled for visit next month      Tobacco use disorder    Refill sent for nicotine patches Encouraged her to continue to try and achieve total  cessation of tobacco       Routine preventative health maintenance measures emphasized. Please refer to After Visit Summary for other counseling recommendations.   Return in about 4 weeks (around 07/20/2022) for repeat pap.      Total face-to-face time with patient: 20 minutes.  Over 50% of encounter was spent on counseling and coordination of care.   Venora Maples, MD/MPH Attending Family Medicine Physician, [facility] for [facility], [facility]

## 2022-06-22 NOTE — Assessment & Plan Note (Signed)
Stable on methadone. Would not recommend going off but would take a long time at her current dose, can discuss further with doctor at [facility].

## 2022-06-22 NOTE — Assessment & Plan Note (Signed)
Refill sent for nicotine patches Encouraged her to continue to try and achieve total cessation of tobacco

## 2022-06-22 NOTE — Assessment & Plan Note (Signed)
Noted after visit to need one year follow up pap Will get her scheduled for visit next month

## 2022-06-28 LAB — TOXASSURE SELECT 13 (MW), URINE

## 2022-07-11 ENCOUNTER — Encounter: Payer: Self-pay | Admitting: Oncology

## 2022-07-12 ENCOUNTER — Other Ambulatory Visit: Payer: Self-pay | Admitting: Family Medicine

## 2022-07-12 DIAGNOSIS — I10 Essential (primary) hypertension: Secondary | ICD-10-CM

## 2022-07-12 DIAGNOSIS — K2100 Gastro-esophageal reflux disease with esophagitis, without bleeding: Secondary | ICD-10-CM

## 2022-07-13 ENCOUNTER — Ambulatory Visit (INDEPENDENT_AMBULATORY_CARE_PROVIDER_SITE_OTHER): Payer: Medicaid Other | Admitting: Clinical

## 2022-07-13 ENCOUNTER — Other Ambulatory Visit (HOSPITAL_COMMUNITY)
Admission: RE | Admit: 2022-07-13 | Discharge: 2022-07-13 | Disposition: A | Discharge status: Home / Self Care | Payer: Medicaid Other | Source: Ambulatory Visit | Attending: Family Medicine | Admitting: Family Medicine

## 2022-07-13 ENCOUNTER — Encounter (HOSPITAL_COMMUNITY): Payer: Self-pay | Admitting: Psychiatry

## 2022-07-13 ENCOUNTER — Encounter (HOSPITAL_COMMUNITY): Payer: Self-pay

## 2022-07-13 ENCOUNTER — Encounter: Payer: Self-pay | Admitting: Family Medicine

## 2022-07-13 ENCOUNTER — Ambulatory Visit (INDEPENDENT_AMBULATORY_CARE_PROVIDER_SITE_OTHER): Payer: Medicaid Other | Admitting: Family Medicine

## 2022-07-13 ENCOUNTER — Ambulatory Visit (INDEPENDENT_AMBULATORY_CARE_PROVIDER_SITE_OTHER): Payer: Medicaid Other | Admitting: Psychiatry

## 2022-07-13 ENCOUNTER — Encounter: Payer: Self-pay | Admitting: Oncology

## 2022-07-13 VITALS — BP 143/88 | HR 115 | Wt 232.9 lb

## 2022-07-13 VITALS — BP 130/86 | HR 79 | Wt 226.0 lb

## 2022-07-13 DIAGNOSIS — F331 Major depressive disorder, recurrent, moderate: Secondary | ICD-10-CM | POA: Diagnosis not present

## 2022-07-13 DIAGNOSIS — F3162 Bipolar disorder, current episode mixed, moderate: Secondary | ICD-10-CM

## 2022-07-13 DIAGNOSIS — K2100 Gastro-esophageal reflux disease with esophagitis, without bleeding: Secondary | ICD-10-CM

## 2022-07-13 DIAGNOSIS — G8929 Other chronic pain: Secondary | ICD-10-CM

## 2022-07-13 DIAGNOSIS — F4010 Social phobia, unspecified: Secondary | ICD-10-CM | POA: Diagnosis not present

## 2022-07-13 DIAGNOSIS — R87610 Atypical squamous cells of undetermined significance on cytologic smear of cervix (ASC-US): Secondary | ICD-10-CM

## 2022-07-13 DIAGNOSIS — F419 Anxiety disorder, unspecified: Secondary | ICD-10-CM

## 2022-07-13 DIAGNOSIS — F1191 Opioid use, unspecified, in remission: Secondary | ICD-10-CM

## 2022-07-13 DIAGNOSIS — F1210 Cannabis abuse, uncomplicated: Secondary | ICD-10-CM | POA: Diagnosis not present

## 2022-07-13 DIAGNOSIS — T7421XA Adult sexual abuse, confirmed, initial encounter: Secondary | ICD-10-CM

## 2022-07-13 DIAGNOSIS — Z113 Encounter for screening for infections with a predominantly sexual mode of transmission: Secondary | ICD-10-CM | POA: Insufficient documentation

## 2022-07-13 DIAGNOSIS — Z124 Encounter for screening for malignant neoplasm of cervix: Secondary | ICD-10-CM

## 2022-07-13 DIAGNOSIS — N898 Other specified noninflammatory disorders of vagina: Secondary | ICD-10-CM

## 2022-07-13 DIAGNOSIS — M545 Low back pain, unspecified: Secondary | ICD-10-CM

## 2022-07-13 DIAGNOSIS — G4700 Insomnia, unspecified: Secondary | ICD-10-CM

## 2022-07-13 DIAGNOSIS — R87810 Cervical high risk human papillomavirus (HPV) DNA test positive: Secondary | ICD-10-CM

## 2022-07-13 DIAGNOSIS — E6609 Other obesity due to excess calories: Secondary | ICD-10-CM

## 2022-07-13 DIAGNOSIS — D509 Iron deficiency anemia, unspecified: Secondary | ICD-10-CM

## 2022-07-13 DIAGNOSIS — F909 Attention-deficit hyperactivity disorder, unspecified type: Secondary | ICD-10-CM

## 2022-07-13 DIAGNOSIS — F1121 Opioid dependence, in remission: Secondary | ICD-10-CM

## 2022-07-13 DIAGNOSIS — Z131 Encounter for screening for diabetes mellitus: Secondary | ICD-10-CM

## 2022-07-13 DIAGNOSIS — F4310 Post-traumatic stress disorder, unspecified: Secondary | ICD-10-CM

## 2022-07-13 MED ORDER — ATOMOXETINE HCL 60 MG PO CAPS: 60.0000 mg | ORAL_CAPSULE | Freq: Every day | ORAL | 5 refills | Status: DC

## 2022-07-13 MED ORDER — PANTOPRAZOLE SODIUM 40 MG PO TBEC: 40.0000 mg | DELAYED_RELEASE_TABLET | Freq: Two times a day (BID) | ORAL | 5 refills | Status: DC

## 2022-07-13 MED ORDER — HYDROXYZINE HCL 25 MG PO TABS: 25.0000 mg | ORAL_TABLET | Freq: Three times a day (TID) | ORAL | 1 refills | Status: AC | PRN

## 2022-07-13 MED ORDER — ARIPIPRAZOLE 5 MG PO TABS: 5.0000 mg | ORAL_TABLET | Freq: Every day | ORAL | 2 refills | Status: DC

## 2022-07-13 MED ORDER — GABAPENTIN 300 MG PO CAPS: 600.0000 mg | ORAL_CAPSULE | Freq: Three times a day (TID) | ORAL | 5 refills | Status: DC

## 2022-07-13 MED ORDER — FLUCONAZOLE 150 MG PO TABS: 150.0000 mg | ORAL_TABLET | Freq: Once | ORAL | 0 refills | Status: AC

## 2022-07-13 MED ORDER — TRAZODONE HCL 50 MG PO TABS: 50.0000 mg | ORAL_TABLET | Freq: Every day | ORAL | 5 refills | Status: DC

## 2022-07-13 MED ORDER — CYCLOBENZAPRINE HCL 10 MG PO TABS: 10.0000 mg | ORAL_TABLET | Freq: Three times a day (TID) | ORAL | 2 refills | Status: DC | PRN
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
## 2022-07-13 NOTE — Plan of Care (Signed)
  Problem: Anxiety Disorder CCP Problem  1  Goal: LTG: Patient will score less than 5 on the Generalized Anxiety Disorder 7 Scale (GAD-7) Outcome: Initial Goal: STG: Patient will reduce frequency of avoidant behaviors by 50% as evidenced by self-report in therapy sessions Outcome: Initial

## 2022-07-14 ENCOUNTER — Encounter: Payer: Self-pay | Admitting: Oncology

## 2022-07-14 LAB — RPR+HBSAG+HCVAB+...
HIV Screen 4th Generation wRfx: NONREACTIVE
Hep C Virus Ab: REACTIVE — AB
Hepatitis B Surface Ag: NEGATIVE
RPR Ser Ql: NONREACTIVE

## 2022-07-14 LAB — HEMOGLOBIN A1C
Est. average glucose Bld gHb Est-mCnc: 120 mg/dL
Hgb A1c MFr Bld: 5.8 % — ABNORMAL HIGH (ref 4.8–5.6)

## 2022-07-15 ENCOUNTER — Encounter: Payer: Self-pay | Admitting: Family Medicine

## 2022-07-15 DIAGNOSIS — K2100 Gastro-esophageal reflux disease with esophagitis, without bleeding: Secondary | ICD-10-CM | POA: Insufficient documentation

## 2022-07-15 DIAGNOSIS — G4700 Insomnia, unspecified: Secondary | ICD-10-CM | POA: Insufficient documentation

## 2022-07-15 DIAGNOSIS — T7421XA Adult sexual abuse, confirmed, initial encounter: Secondary | ICD-10-CM | POA: Insufficient documentation

## 2022-07-15 DIAGNOSIS — E6609 Other obesity due to excess calories: Secondary | ICD-10-CM | POA: Insufficient documentation

## 2022-07-15 NOTE — Assessment & Plan Note (Signed)
Refill given for flexeril

## 2022-07-15 NOTE — Assessment & Plan Note (Signed)
-   Stable on methadone

## 2022-07-15 NOTE — Assessment & Plan Note (Signed)
Worried that she is becoming diabetic due to her weight and because she is getting +EtOH on tox screens are her methadone clinic, A1c ordered.  Also very motivated to lose weight, accepts referral to weight loss clinic and understands that they are very strict about appointments.
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
## 2022-07-15 NOTE — Assessment & Plan Note (Signed)
Have discussed many times with her that I do not think stimulants are an appropriate choice for her but it is also incredibly obvious in the scattered nature of our appointments that patient has significant ADHD symptoms. Will trial Strattera and see if this improves her symptoms.

## 2022-07-15 NOTE — Assessment & Plan Note (Signed)
Discussed UDS from last visit, she admits to use of illicit benzos for anxiety and stress relief. Strongly urged her not to do this given her history of substance use disorders. I am very happy she has started going to [facility] and hopefully Abilify will help to improve her symptoms.

## 2022-07-15 NOTE — Assessment & Plan Note (Signed)
Despite BID PPI therapy she is still having refractory symptoms, at this point will refer to GI for endoscopy

## 2022-07-15 NOTE — Assessment & Plan Note (Signed)
Trazodone helping, continue

## 2022-07-15 NOTE — Assessment & Plan Note (Addendum)
Repeat pap collected today. Notable erythema of introitus on exam today, suspect candida, rx sent for fluconazole.

## 2022-07-17 LAB — CYTOLOGY - PAP
Chlamydia: NEGATIVE
Comment: NEGATIVE
Comment: NEGATIVE
Comment: NEGATIVE
Comment: NORMAL
High risk HPV: POSITIVE — AB
Neisseria Gonorrhea: NEGATIVE
Trichomonas: NEGATIVE

## 2022-07-18 ENCOUNTER — Encounter: Payer: Self-pay | Admitting: Family Medicine

## 2022-07-20 ENCOUNTER — Encounter: Payer: Self-pay | Admitting: Oncology

## 2022-08-04 ENCOUNTER — Other Ambulatory Visit: Payer: Self-pay | Admitting: Family Medicine

## 2022-08-04 DIAGNOSIS — F909 Attention-deficit hyperactivity disorder, unspecified type: Secondary | ICD-10-CM

## 2022-08-07 IMAGING — US US OB < 14 WEEKS - US OB TV
1 series · 15 of 28 positions shown · non-contrast
Comparison: None.

CLINICAL DATA: Lower abdominal pain

EXAM:
OBSTETRIC <14 WK US AND TRANSVAGINAL OB US
TECHNIQUE: Both transabdominal and transvaginal ultrasound examinations were
performed for complete evaluation of the gestation as well as the
maternal uterus, adnexal regions, and pelvic cul-de-sac.
Transvaginal technique was performed to assess early pregnancy.

[Series 1: us ob < 14 weeks - us ob tv · 15 of 28 slices shown]
[im 1/28]
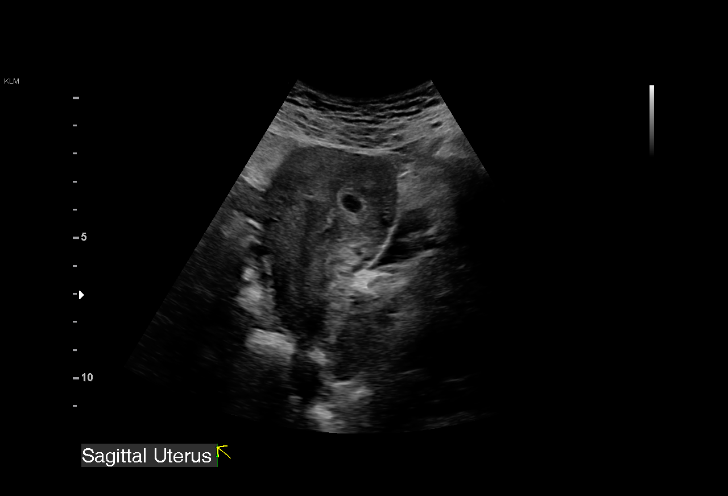
[im 3/28]
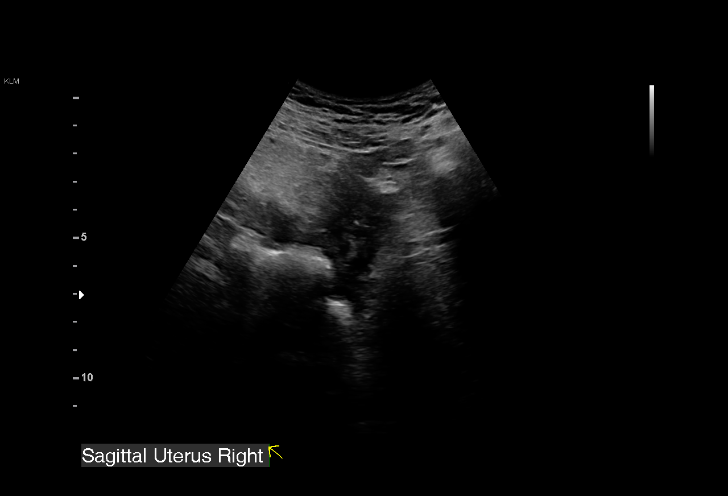
[im 5/28]
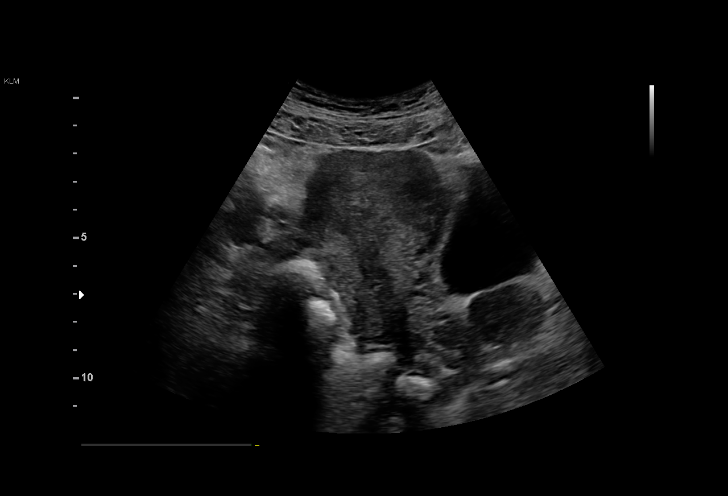
[im 7/28]
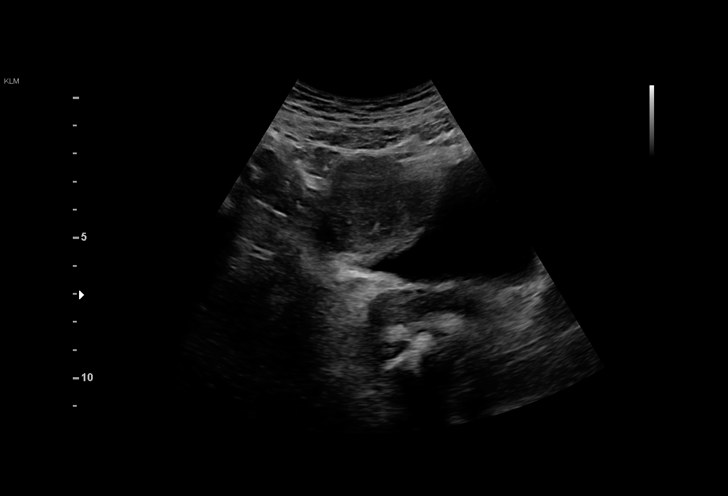
[im 9/28]
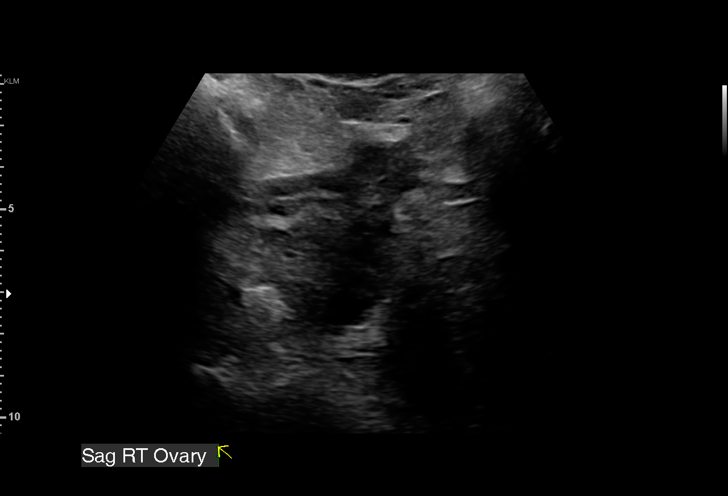
[im 11/28]
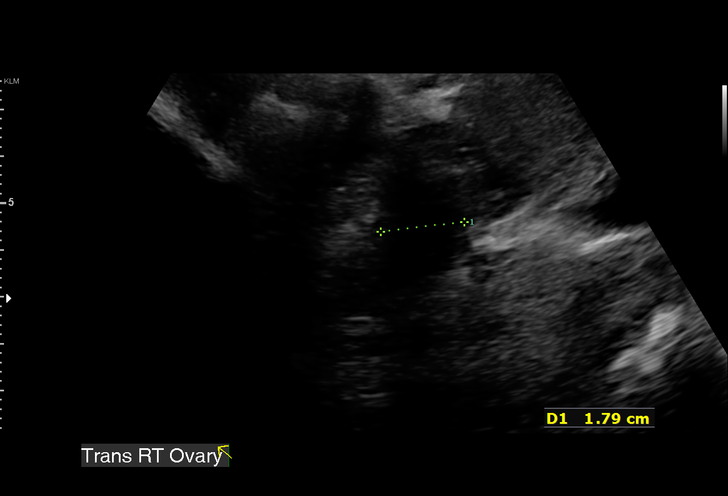
[im 13/28]
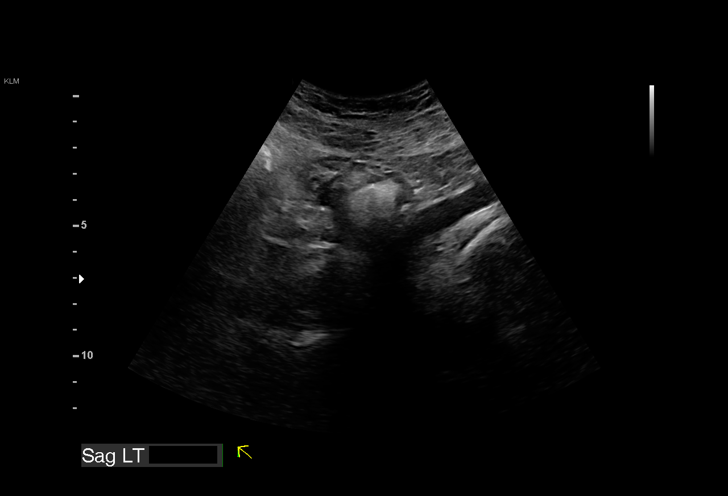
[im 15/28]
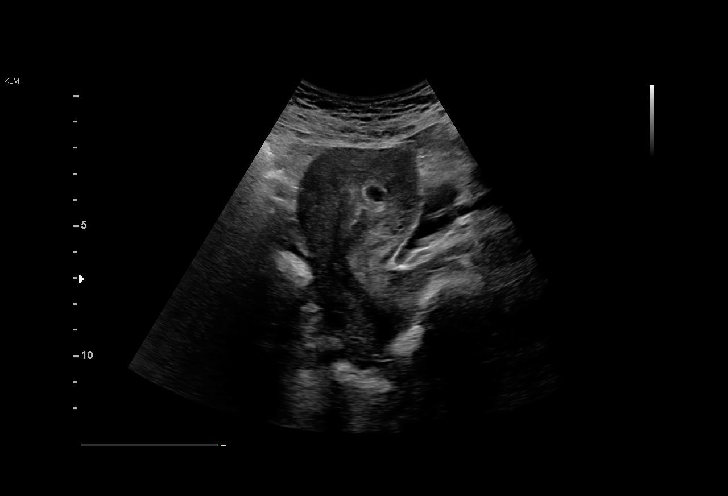
[im 16/28]
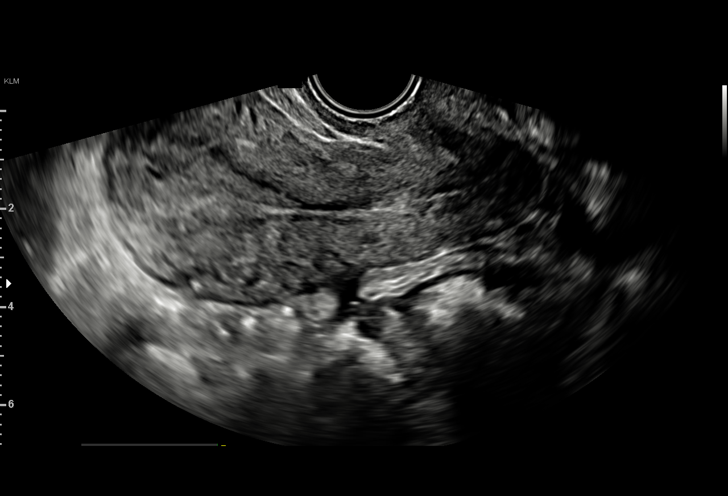
[im 18/28]
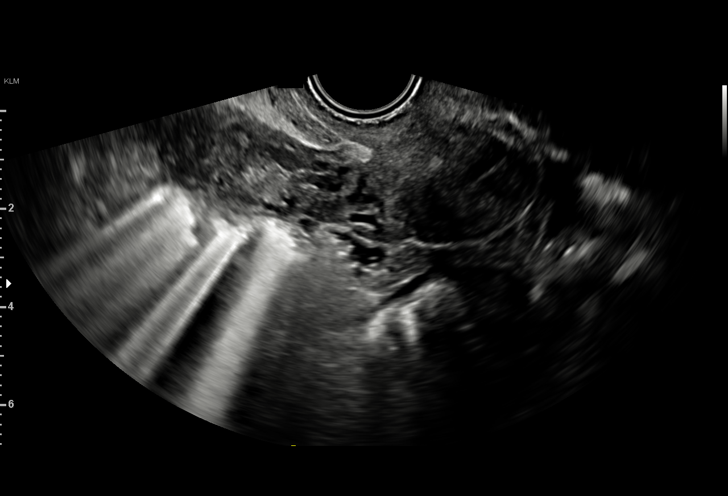
[im 20/28]
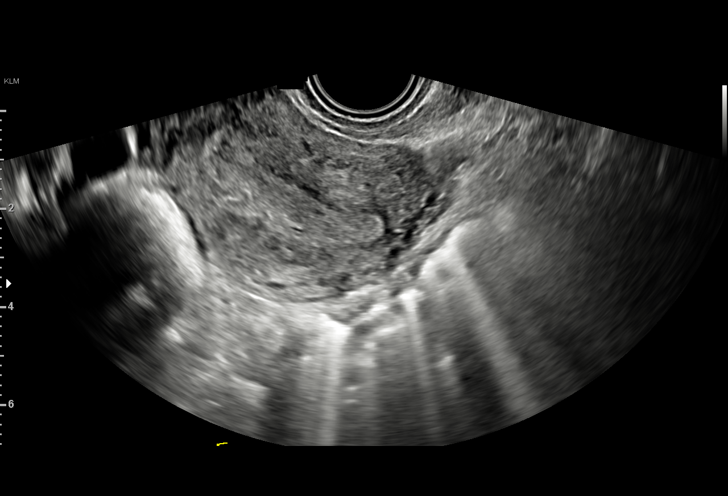
[im 22/28]
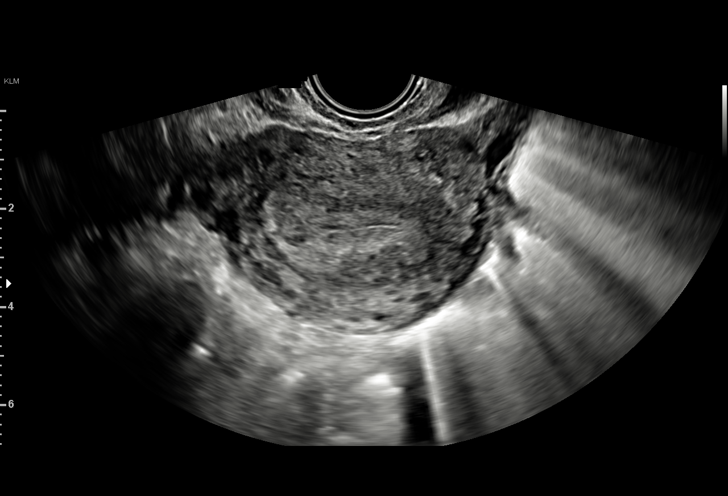
[im 24/28]
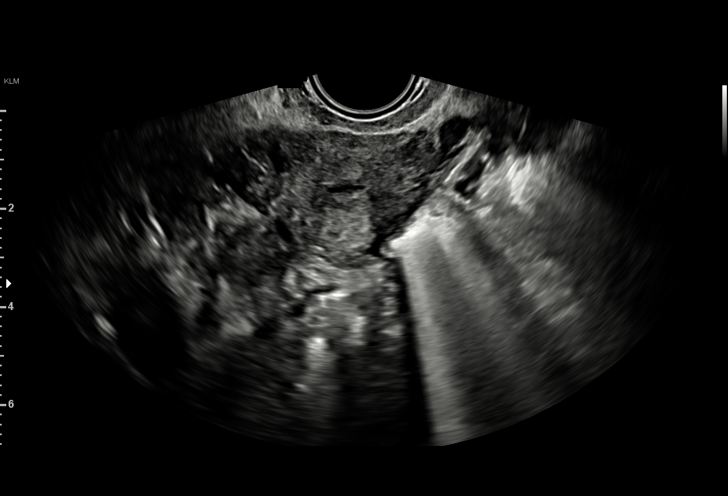
[im 26/28]
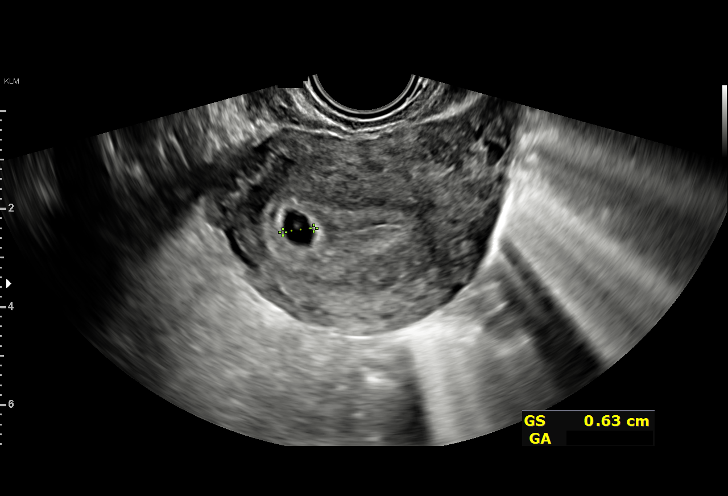
[im 28/28]
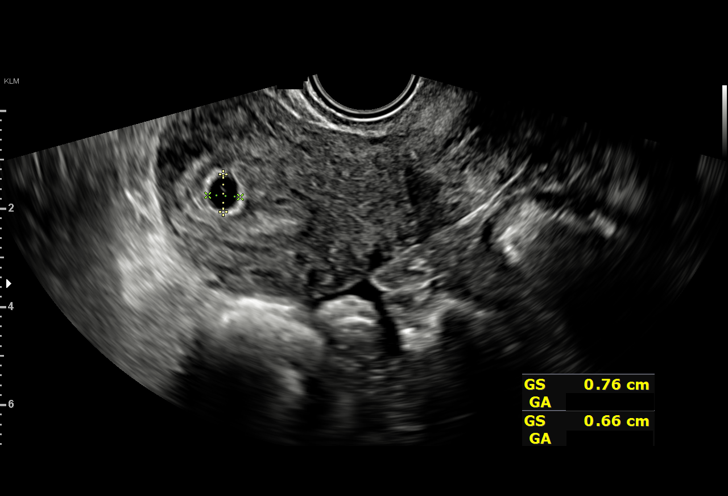

[15 of 28 positions shown; findings below may reference images not displayed]

FINDINGS: Intrauterine gestational sac: Single

Yolk sac:  Possible.

Embryo:  Not Visualized.

Cardiac Activity: Not Visualized.

MSD: 6.8 mm   5 w   2 d

Subchorionic hemorrhage:  None visualized.

technologist note. Right ovary unremarkable. Left ovary incompletely
evaluated. No free fluid.
IMPRESSION: Probable early intrauterine gestational sac with possible yolk sac.
No fetal pole or cardiac activity yet visualized. Recommend
follow-up quantitative B-HCG levels and follow-up US in 14 days to
assess viability.

## 2022-08-16 ENCOUNTER — Ambulatory Visit: Payer: Medicaid Other | Admitting: Family Medicine

## 2022-08-26 ENCOUNTER — Encounter (HOSPITAL_COMMUNITY): Payer: Self-pay | Admitting: Psychiatry

## 2022-09-06 ENCOUNTER — Ambulatory Visit: Payer: Medicaid Other | Attending: Physical Therapy | Admitting: Physical Therapy

## 2022-09-12 ENCOUNTER — Ambulatory Visit: Payer: Medicaid Other | Admitting: Neurology

## 2022-09-12 ENCOUNTER — Encounter: Payer: Self-pay | Admitting: Neurology

## 2022-09-12 DIAGNOSIS — Z029 Encounter for administrative examinations, unspecified: Secondary | ICD-10-CM

## 2022-09-21 ENCOUNTER — Ambulatory Visit (INDEPENDENT_AMBULATORY_CARE_PROVIDER_SITE_OTHER): Payer: Commercial Managed Care - HMO | Admitting: Family Medicine

## 2022-09-21 ENCOUNTER — Encounter: Payer: Self-pay | Admitting: Family Medicine

## 2022-09-21 VITALS — BP 120/83 | HR 85 | Wt 230.5 lb

## 2022-09-21 DIAGNOSIS — D509 Iron deficiency anemia, unspecified: Secondary | ICD-10-CM

## 2022-09-21 DIAGNOSIS — F419 Anxiety disorder, unspecified: Secondary | ICD-10-CM

## 2022-09-21 DIAGNOSIS — F909 Attention-deficit hyperactivity disorder, unspecified type: Secondary | ICD-10-CM

## 2022-09-21 DIAGNOSIS — K2100 Gastro-esophageal reflux disease with esophagitis, without bleeding: Secondary | ICD-10-CM | POA: Diagnosis not present

## 2022-09-21 MED ORDER — ATOMOXETINE HCL 25 MG PO CAPS: 25.0000 mg | ORAL_CAPSULE | Freq: Two times a day (BID) | ORAL | 5 refills | Status: DC

## 2022-09-21 MED ORDER — CYCLOBENZAPRINE HCL 10 MG PO TABS: 10.0000 mg | ORAL_TABLET | Freq: Three times a day (TID) | ORAL | 5 refills | Status: DC | PRN

## 2022-09-21 NOTE — Assessment & Plan Note (Signed)
Has not been back yet to [facility], was having trouble knowing when her appts were. MyChart reactivated on her phone, encouraged her to call and make an appt as it sounds like she needs uptitration of her Abilify dose.

## 2022-09-21 NOTE — Progress Notes (Signed)
GYNECOLOGY OFFICE VISIT NOTE  History:   Meghan Bullock is a 31 y.o. [phone] here today for follow up.  Has not been back to [facility] since last visit, missed a few visits but didn't know she had them We fixed her MyChart access during the visit to hopefully avoid this problem Reports she is still taking Abilify 5 mg, has not had a dose increase since no follow up yet  Feels more focused on strattera but also very drowsy Has been taking it consistently Really feels like its helping  Has not seen GI since last visit  Health Maintenance Due  Topic Date Due   COVID-19 Vaccine (1) Never done   INFLUENZA VACCINE  06/28/2022    Past Medical History:  Diagnosis Date   Anemia    Anxiety    Bipolar 1 disorder ([place])    Chlamydia    Hepatitis C    Heroin abuse ([place])    Mononucleosis    Ovarian cyst    Polysubstance abuse ([place])    Preterm premature rupture of membranes (PPROM) with unknown onset of labor 05/12/2014    Past Surgical History:  Procedure Laterality Date   HAND SURGERY     I & D EXTREMITY Left 09/16/2019   Procedure: IRRIGATION AND DEBRIDEMENT EXTREMITY;  Surgeon: Verner Mould, MD;  Location: [facility];  Service: Orthopedics;  Laterality: Left;    The following portions of the patient's history were reviewed and updated as appropriate: allergies, current medications, past family history, past medical history, past social history, past surgical history and problem list.   Health Maintenance:   Last pap: Lab Results  Component Value Date   DIAGPAP - Low grade squamous intraepithelial lesion (LSIL) (A) 07/13/2022   HPVHIGH Positive (A) 07/13/2022   Needs repeat in one year  Last mammogram:  N/a    Review of Systems:  Pertinent items noted in HPI and remainder of comprehensive ROS otherwise negative.  Physical Exam:  BP 120/83   Pulse 85   Wt 230 lb 8 oz (104.6 kg)   Breastfeeding No   BMI 39.57 kg/m  CONSTITUTIONAL: Well-developed,  well-nourished female in no acute distress.  HEENT:  Normocephalic, atraumatic. External right and left ear normal. No scleral icterus.  NECK: Normal range of motion, supple, no masses noted on observation SKIN: No rash noted. Not diaphoretic. No erythema. No pallor. MUSCULOSKELETAL: Normal range of motion. No edema noted. NEUROLOGIC: Alert and oriented to person, place, and time. Normal muscle tone coordination.  PSYCHIATRIC: Normal mood and affect. Normal behavior. Normal judgment and thought content. RESPIRATORY: Effort normal, no problems with respiration noted   Labs and Imaging No results found for this or any previous visit (from the past 168 hour(s)). No results found.    Assessment and Plan:   Problem List Items Addressed This Visit       Digestive   Gastroesophageal reflux disease with esophagitis without hemorrhage    Has not heard from GI, consult placed again      Relevant Orders   Ambulatory referral to Gastroenterology     Other   Iron deficiency anemia   Relevant Medications   cyclobenzaprine (FLEXERIL) 10 MG tablet   Anxiety    Has not been back yet to [facility], was having trouble knowing when her appts were. MyChart reactivated on her phone, encouraged her to call and make an appt as it sounds like she needs uptitration of her Abilify dose.  ADHD - Primary    I definitely noticed an improvement today, much more focused and less scattered. Currently on 60 mg, rx sent for 25 mg BID, hopefully split dosing will help with fatigue but if not can drop to 25 mg once daily.      Relevant Medications   atomoxetine (STRATTERA) 25 MG capsule    Routine preventative health maintenance measures emphasized. Please refer to After Visit Summary for other counseling recommendations.   Return in about 2 months (around 11/21/2022) for follow up visit.    Total face-to-face time with patient: 25 minutes.  Over 50% of encounter was spent on counseling and coordination of  care.   Venora Maples, MD/MPH Attending Family Medicine Physician, [facility] for [facility], [facility]

## 2022-09-21 NOTE — Assessment & Plan Note (Signed)
Has not heard from GI, consult placed again

## 2022-09-21 NOTE — Assessment & Plan Note (Signed)
I definitely noticed an improvement today, much more focused and less scattered. Currently on 60 mg, rx sent for 25 mg BID, hopefully split dosing will help with fatigue but if not can drop to 25 mg once daily.

## 2022-09-29 ENCOUNTER — Encounter: Payer: Self-pay | Admitting: Family Medicine

## 2022-09-29 ENCOUNTER — Encounter: Payer: Self-pay | Admitting: Oncology

## 2022-09-29 DIAGNOSIS — F419 Anxiety disorder, unspecified: Secondary | ICD-10-CM

## 2022-09-29 DIAGNOSIS — G2581 Restless legs syndrome: Secondary | ICD-10-CM

## 2022-10-04 ENCOUNTER — Other Ambulatory Visit: Payer: Self-pay | Admitting: Family Medicine

## 2022-10-04 DIAGNOSIS — F419 Anxiety disorder, unspecified: Secondary | ICD-10-CM

## 2022-10-04 MED ORDER — GABAPENTIN 300 MG PO CAPS: 600.0000 mg | ORAL_CAPSULE | Freq: Three times a day (TID) | ORAL | 5 refills | Status: DC

## 2022-10-13 ENCOUNTER — Other Ambulatory Visit: Payer: Self-pay | Admitting: Family Medicine

## 2022-10-13 DIAGNOSIS — F909 Attention-deficit hyperactivity disorder, unspecified type: Secondary | ICD-10-CM

## 2022-10-26 MED ORDER — GABAPENTIN 600 MG PO TABS: 600.0000 mg | ORAL_TABLET | Freq: Three times a day (TID) | ORAL | 5 refills | Status: DC

## 2022-11-09 NOTE — Telephone Encounter (Signed)
Called patient to discuss her symptoms of neck/back pain per her request. She reports it has been going on for weeks (months?) but has gotten acutely worse over the past few weeks. Feels like she has a hump in her back. No numbness or tingling in her arms or fingers but does endorse some locally right on the hump/neck area. No fevers.  Discussed since no red flags at present we can stick to our previously scheduled visit in one week from today. If she develops neurological symptoms or fever she is to be seen ASAP. She was in agreement with plan.

## 2022-11-16 ENCOUNTER — Ambulatory Visit: Payer: Commercial Managed Care - HMO | Admitting: Family Medicine

## 2022-11-22 ENCOUNTER — Encounter (INDEPENDENT_AMBULATORY_CARE_PROVIDER_SITE_OTHER): Payer: Self-pay

## 2022-11-26 ENCOUNTER — Emergency Department (HOSPITAL_COMMUNITY)
Admission: EM | Admit: 2022-11-26 | Discharge: 2022-11-26 | Disposition: A | Discharge status: Home / Self Care | Payer: Medicaid Other | Attending: Emergency Medicine | Admitting: Emergency Medicine

## 2022-11-26 DIAGNOSIS — Z760 Encounter for issue of repeat prescription: Secondary | ICD-10-CM | POA: Diagnosis present

## 2022-11-26 MED ORDER — METHADONE HCL 10 MG PO TABS
200.0000 mg | ORAL_TABLET | Freq: Once | ORAL | Status: AC
  Administered 2022-11-26: 200 mg via ORAL
  Filled 2022-11-26: qty 20

## 2022-11-26 NOTE — ED Provider Notes (Cosign Needed)
[facility] EMERGENCY DEPARTMENT Provider Note   CSN: [id number] Arrival date & time: 11/26/22  7116     History  Chief Complaint  Patient presents with   Request Methadone    Meghan Bullock is a 31 y.o. female.  The history is provided by the patient and medical records. No language interpreter was used.     31 year old female with hx of polysubstance use, opioid dependency currently attending methadone clinic presenting requesting for methadone dose.  Pt report she had a flat tire and was unable to get to the clinic today before it closed at 7am.  She called the clinic and was told to go to the ER.  Pt otherwise without other complaints.  No nausea, vomiting, jittery, heart palpation or sweating.  Last dose was yesterday.  She report she takes a high dose of methadone and cannot miss any dose.    Home Medications Prior to Admission medications   Medication Sig Start Date End Date Taking? Authorizing Provider  ARIPiprazole (ABILIFY) 5 MG tablet Take 1 tablet (5 mg total) by mouth daily. Patient not taking: Reported on 07/13/2022 07/13/22   Karsten Ro, MD  atomoxetine (STRATTERA) 25 MG capsule Take 1 capsule (25 mg total) by mouth 2 (two) times daily with a meal. 09/21/22 10/21/22  Venora Maples, MD  atomoxetine (STRATTERA) 60 MG capsule Take 1 capsule (60 mg total) by mouth daily. 07/13/22   Venora Maples, MD  cyclobenzaprine (FLEXERIL) 10 MG tablet Take 1 tablet (10 mg total) by mouth every 8 (eight) hours as needed for muscle spasms. 09/21/22   Venora Maples, MD  gabapentin (NEURONTIN) 600 MG tablet Take 1 tablet (600 mg total) by mouth 3 (three) times daily. 10/26/22 11/25/22  Venora Maples, MD  hydrOXYzine (ATARAX) 25 MG tablet Take 1 tablet (25 mg total) by mouth 3 (three) times daily as needed. 07/13/22   Karsten Ro, MD  methadone (DOLOPHINE) 0.4 mg/mL SOLN Take 170 mg by mouth daily.    [provider]  nicotine (NICODERM  CQ - DOSED IN MG/24 HOURS) 14 mg/24hr patch Place 1 patch (14 mg total) onto the skin daily. 06/22/22   Venora Maples, MD  nicotine polacrilex (NICORETTE) 2 MG gum Take 1 each (2 mg total) by mouth as needed for smoking cessation. Patient not taking: Reported on 04/13/2022 02/04/22   Venora Maples, MD  pantoprazole (PROTONIX) 40 MG tablet Take 1 tablet (40 mg total) by mouth 2 (two) times daily. 07/13/22   Venora Maples, MD  traZODone (DESYREL) 50 MG tablet Take 1 tablet (50 mg total) by mouth at bedtime. 07/13/22   Venora Maples, MD      Allergies    Fentanyl    Review of Systems   Review of Systems  All other systems reviewed and are negative.   Physical Exam Updated Vital Signs BP 131/84   Pulse 100   Temp 98.1 F (36.7 C)   Resp 16   SpO2 97%  Physical Exam Vitals and nursing note reviewed.  Constitutional:      General: She is not in acute distress.    Appearance: She is well-developed.  HENT:     Head: Atraumatic.  Eyes:     Conjunctiva/sclera: Conjunctivae normal.  Pulmonary:     Effort: Pulmonary effort is normal.  Musculoskeletal:     Cervical back: Neck supple.  Skin:    Findings: No rash.  Neurological:  Mental Status: She is alert.  Psychiatric:        Mood and Affect: Mood normal.     ED Results / Procedures / Treatments   Labs (all labs ordered are listed, but only abnormal results are displayed) Labs Reviewed - No data to display  EKG None  Radiology No results found.  Procedures Procedures    Medications Ordered in ED Medications  methadone (DOLOPHINE) tablet 200 mg (200 mg Oral Given 11/26/22 1024)    ED Course/ Medical Decision Making/ A&P                           Medical Decision Making  BP 131/84   Pulse 100   Temp 98.1 F (36.7 C)   Resp 16   SpO2 97%   1:19 AM  31 year old female with hx of polysubstance use, opioid dependency currently attending methadone clinic presenting requesting for methadone  dose.  Pt report she had a flat tire and was unable to get to the clinic today before it closed at 7am.  She called the clinic and was told to go to the ER.  Pt otherwise without other complaints.  No nausea, vomiting, jittery, heart palpation or sweating.  Last dose was yesterday.  She report she takes a high dose of methadone and cannot miss any dose.  On exam pt is sitting in the chair resting comfortably and does not exhibit any withdrawal sxs.  She is mentating appropriately.  I have reached out to her clinic, [facility] [phone] and spoke with their representative who did corroborate with her story.  Pt takes 200mg  methadone daily.  Last dose was 10am yesterday.   Due to risk of withdrawal, will order daily dose.  I have also discussed this with our ED Pharmacist, .    Vital sign reviewed, pt's HR is 100 and does appears mildly tremulous.    10:25 AM Pt received methadone, report feeling better. Stable for discharge.         Final Clinical Impression(s) / ED Diagnoses Final diagnoses:  Encounter for medication refill    Rx / DC Orders ED Discharge Orders     None         Herbert Seta, PA-C 11/26/22 1025

## 2022-11-26 NOTE — Discharge Instructions (Signed)
Please make sure not to miss your methadone appointment to avoid drug withdrawal.

## 2022-11-26 NOTE — ED Triage Notes (Signed)
Patient states she goes to a methadone clinic daily but today had a flat tire and was unable to get to the clinic before it closed at 0700 this morning. Patient states she called the clinic and was told to go to the emergency department.

## 2022-12-27 IMAGING — US US MFM OB FOLLOW-UP
1 series · 13 of 28 positions shown · non-contrast
Comparison: none

[Series 1: us mfm ob follow-up · 13 of 77 slices shown]
[im 3/77]
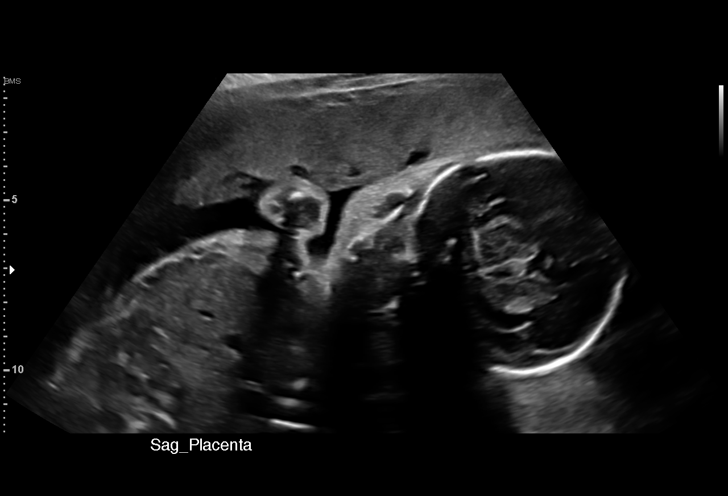
[im 9/77]
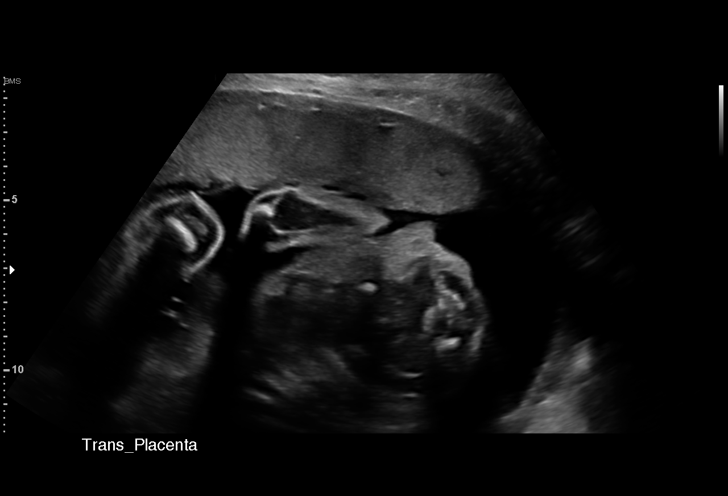
[im 15/77]
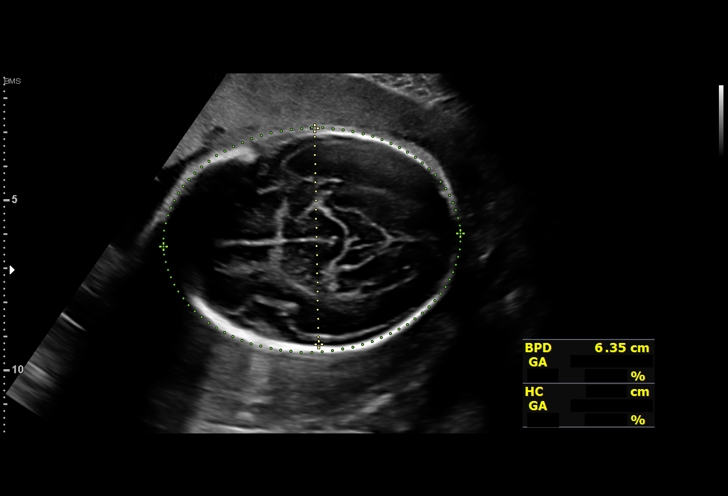
[im 20/77]
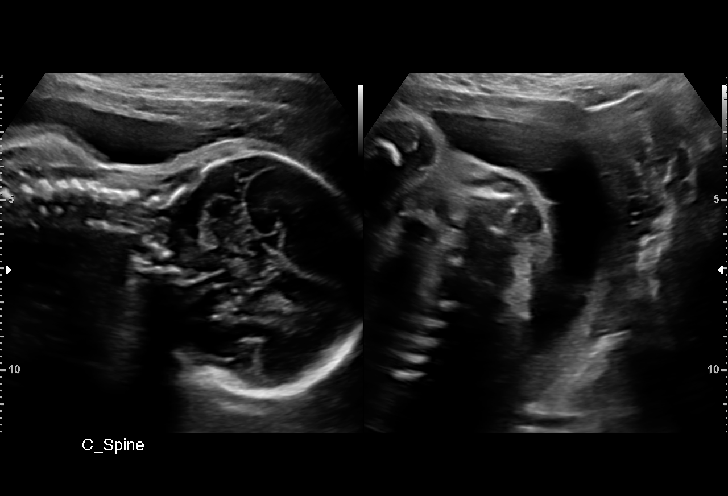
[im 26/77]
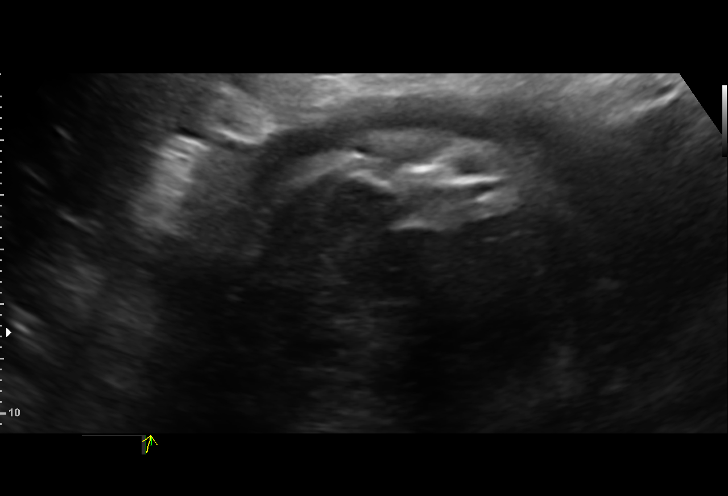
[im 31/77]
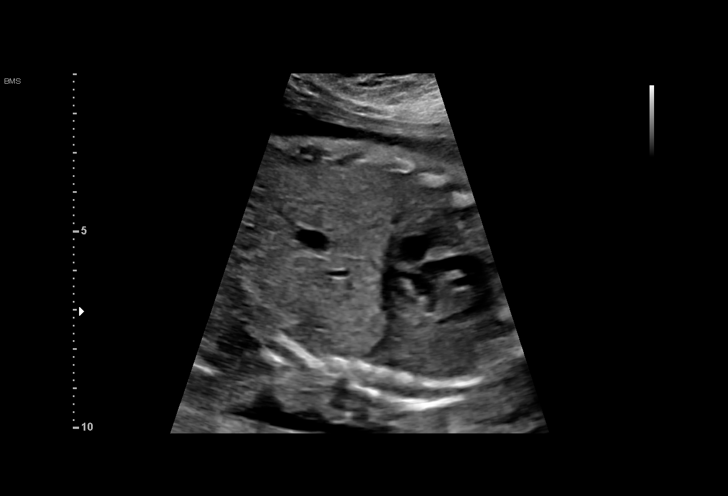
[im 40/77]
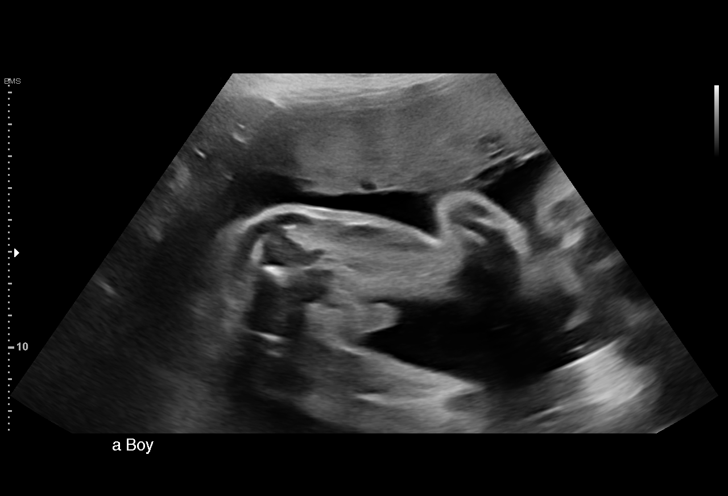
[im 46/77]
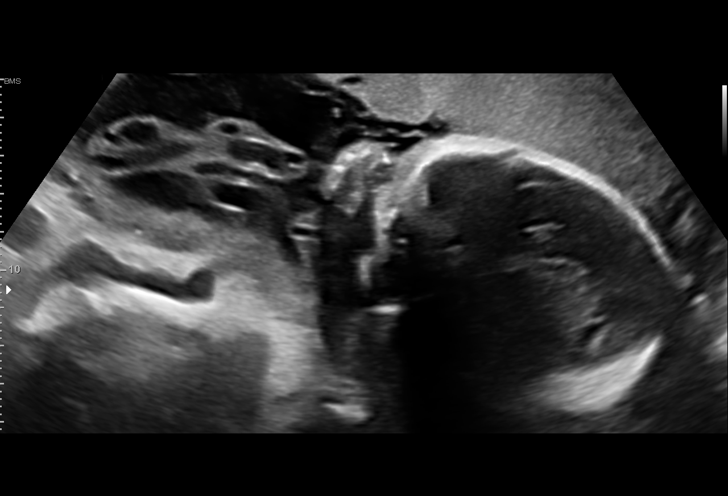
[im 51/77]
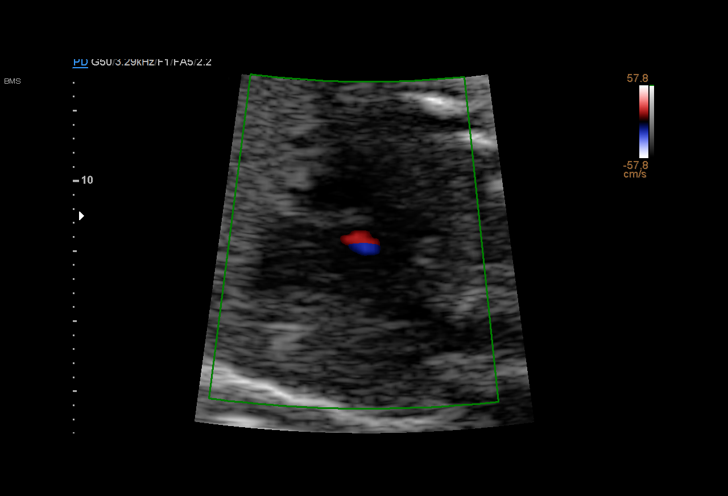
[im 57/77]
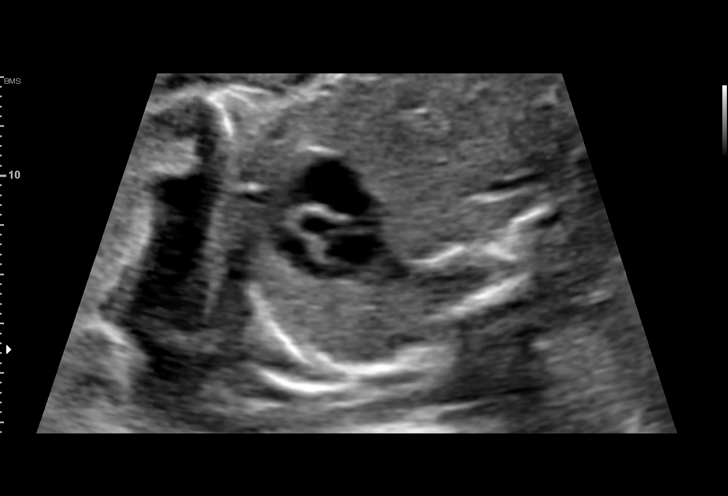
[im 62/77]
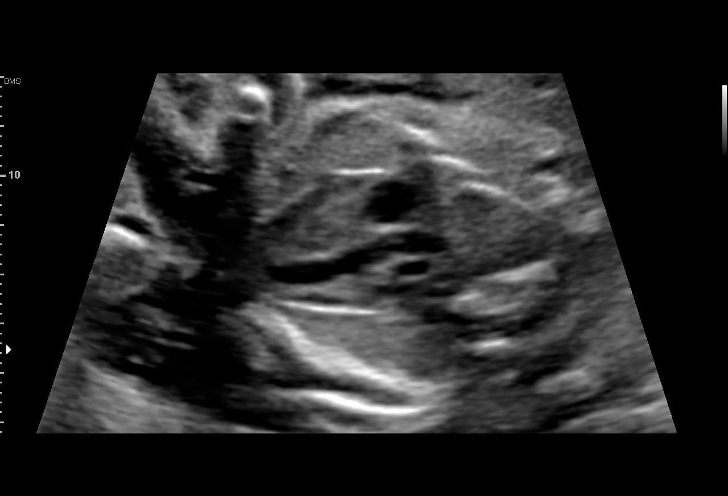
[im 68/77]
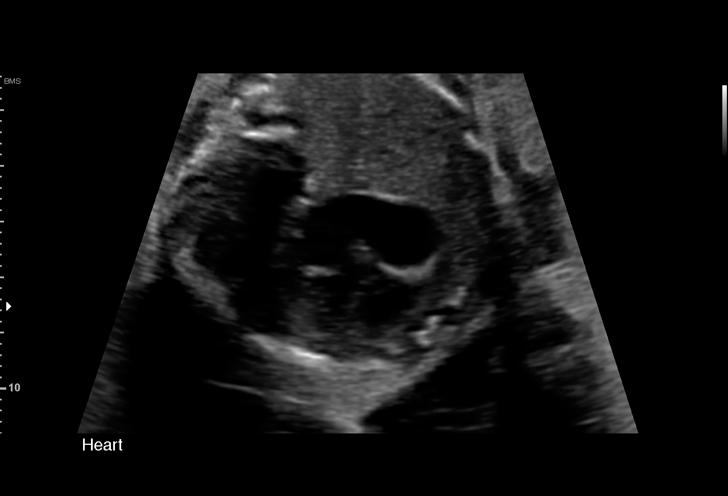
[im 74/77]
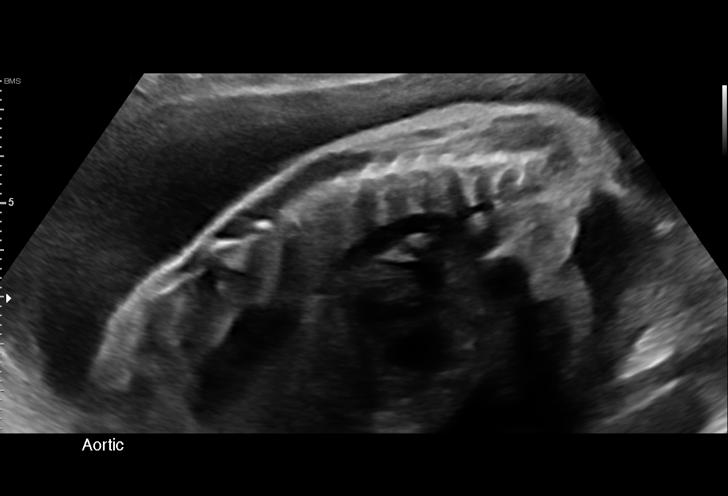

[13 of 28 positions shown; findings below may reference images not displayed]

[REDACTED]care

Indications

 Chronic Hepatitis C complicating pregnancy,
 antepartum
 Substance abuse affecting pregnancy,
 antepartum(Heroin/Lete)
 Suboxone use
 Encounter for antenatal screening,
 unspecified
 25 weeks gestation of pregnancy
Fetal Evaluation

 Num Of Fetuses:         1
 Fetal Heart Rate(bpm):  158
 Cardiac Activity:       Observed
 Presentation:           Cephalic
 Placenta:               Anterior
 P. Cord Insertion:      Visualized, central

 Amniotic Fluid
 AFI FV:      Within normal limits

                             Largest Pocket(cm)

Biometry

 BPD:      63.3  mm     G. Age:  25w 4d         32  %    CI:        68.34   %    70 - 86
                                                         FL/HC:      19.3   %    18.6 -
 HC:      244.8  mm     G. Age:  26w 4d         51  %    HC/AC:      1.06        1.04 -
 AC:      230.5  mm     G. Age:  27w 3d         85  %    FL/BPD:     74.6   %    71 - 87
 FL:       47.2  mm     G. Age:  25w 5d         32  %    FL/AC:      20.5   %    20 - 24
 CER:        30  mm     G. Age:  26w 1d         55  %

 LV:        5.9  mm
 CM:        7.5  mm

 Est. FW:     964  gm      2 lb 2 oz     73  %
OB History

 Gravidity:    4         Term:   1        Prem:   1        SAB:   1
 Living:       2
Gestational Age

 LMP:           25w 6d        Date:  08/08/20                 EDD:   05/15/21
 Clinical EDD:  25w 6d                                        EDD:   05/15/21
 U/S Today:     26w 2d                                        EDD:   05/12/21
 Best:          25w 6d     Det. By:  LMP  (08/08/20)          EDD:   05/15/21
Anatomy

 Cranium:               Appears normal         Aortic Arch:            Not well visualized
 Cavum:                 Appears normal         Ductal Arch:            Previously seen
 Ventricles:            Appears normal         Diaphragm:              Appears normal
 Choroid Plexus:        Previously seen        Stomach:                Appears normal, left
                                                                       sided
 Cerebellum:            Appears normal         Abdomen:                Appears normal
 Posterior Fossa:       Appears normal         Abdominal Wall:         Previously seen
 Nuchal Fold:           Previously seen        Cord Vessels:           Previously seen
 Face:                  Orbits nl; profile not Kidneys:                Previously seen
                        well visualized
 Lips:                  Appears normal         Bladder:                Appears normal
 Thoracic:              Appears normal         Spine:                  Ltd views no
                                                                       intracranial signs of
                                                                       NTD
 Heart:                 Not well visualized    Upper Extremities:      Appears normal
 RVOT:                  Not well visualized    Lower Extremities:      Appears normal
 LVOT:                  Appears normal

 Other:  Appears to be male. Technically difficult due to maternal habitus and
         fetal position.
Cervix Uterus Adnexa

 Cervix
 Not visualized (advanced GA >65wks)

 Uterus
 No abnormality visualized.

 Right Ovary
 Not visualized.

 Left Ovary
 Within normal limits.
 Cul De Sac
 No free fluid seen.

 Adnexa
 No adnexal mass visualized.
Impression

 Suboxone maintenance.  Patient returned for completion of
 fetal anatomical survey.
 Fetal growth is appropriate for gestational age.  Amniotic fluid
 is normal and good fetal activity seen.
 Fetal anatomical survey could not be completed because of
 fetal position.  LVOT appears normal.
Recommendations

 -An appointment was made for her to return in 4 weeks for
 fetal growth assessment and completion of fetal anatomy if
 possible.
                 Shawkat, Rineesh

## 2022-12-28 ENCOUNTER — Telehealth: Payer: Self-pay | Admitting: Family Medicine

## 2022-12-28 NOTE — Telephone Encounter (Signed)
Patient came in to the office saying the pharmacy wont refill for protonix due to an insurance issue. She wants to know if she can at least get a days worth of medicine until it can be resolved.

## 2022-12-30 NOTE — Telephone Encounter (Signed)
Called pt and informed her that her insurance will only cover 90 tablets/per year unless she has prior [place]. The pharmacy will send that to [place] and we will attempt to obtain authorization next week. Meanwhile she can purchase Prilosec OTC (generic is ok) and take according to package directions. Pt voiced understanding.

## 2023-01-29 IMAGING — US US MFM OB FOLLOW-UP
1 series · 14 of 28 positions shown · non-contrast
Comparison: none

[Series 1: us mfm ob follow-up · 47 acquisitions, 14 frames shown]
[im 2/47]
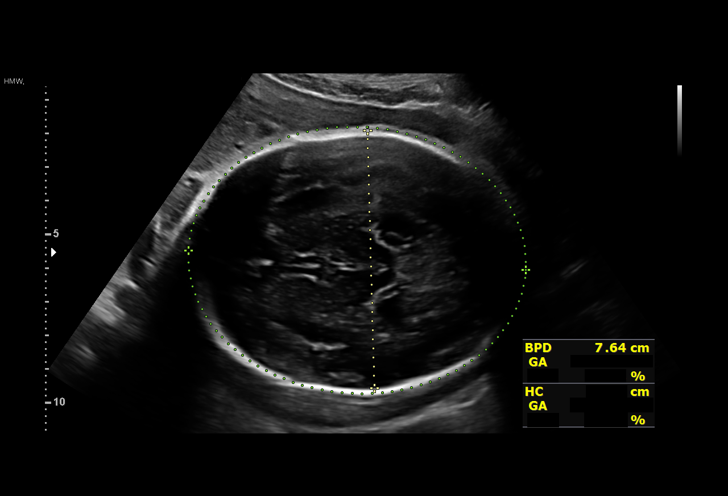
[im 6/47]
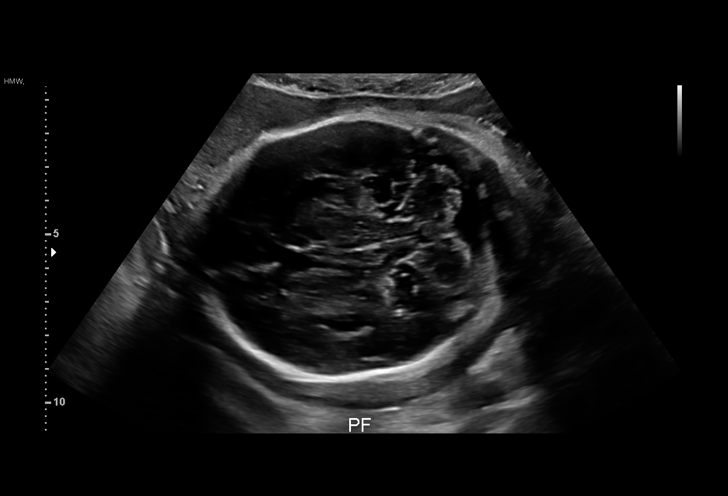
[im 9/47]
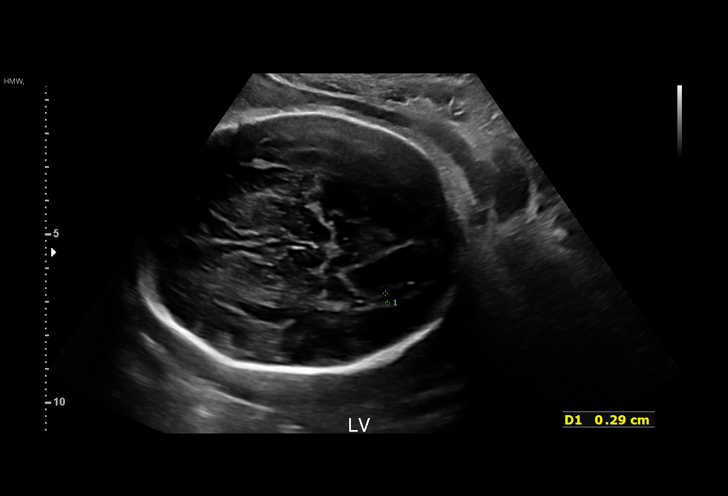
[im 12/47]
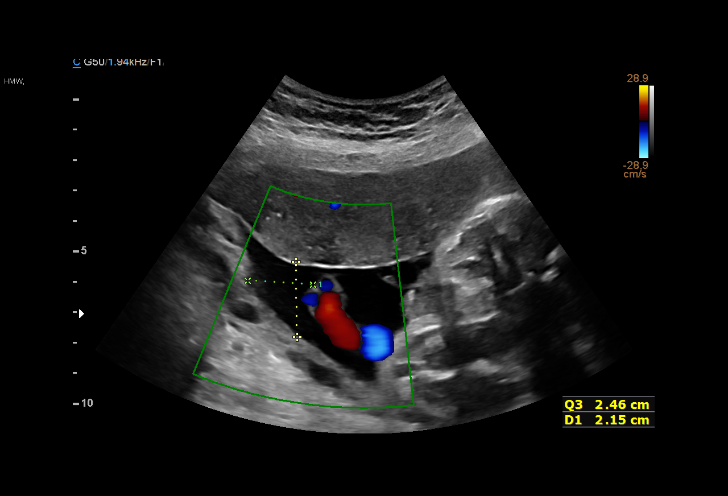
[im 16/47]
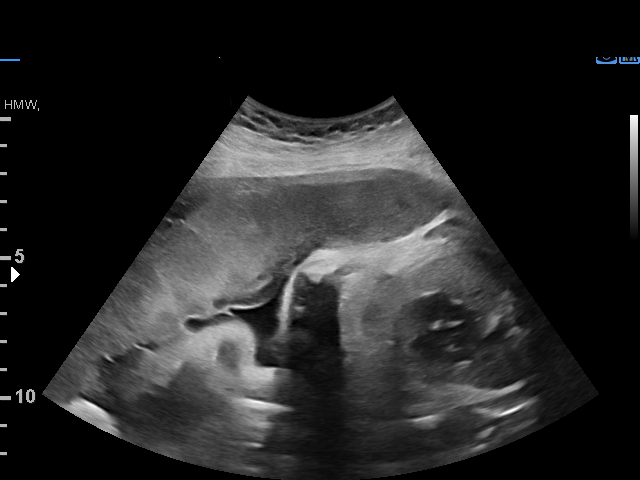
[im 19/47]
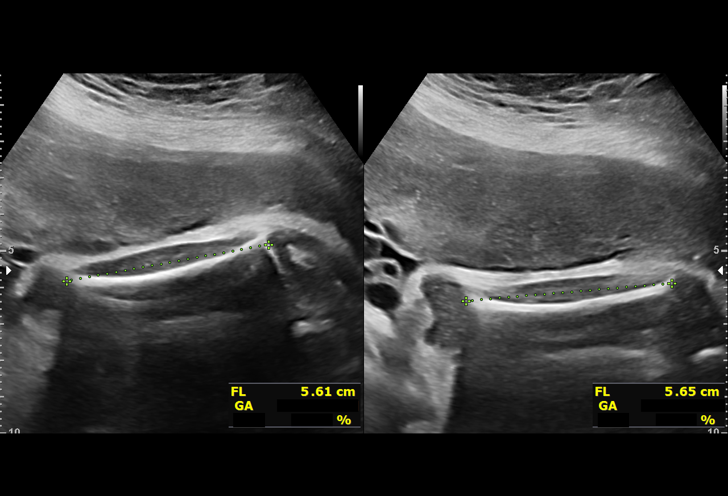
[im 23/47]
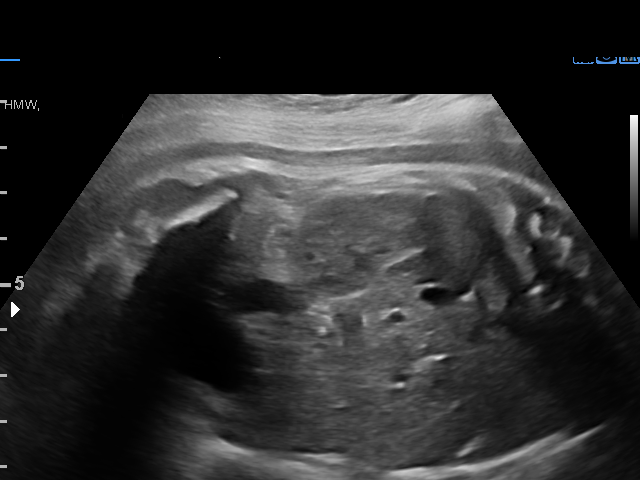
[im 26/47]
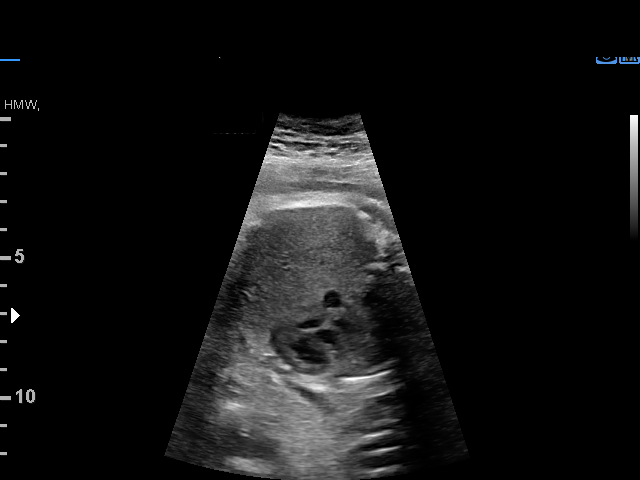
[im 29/47]
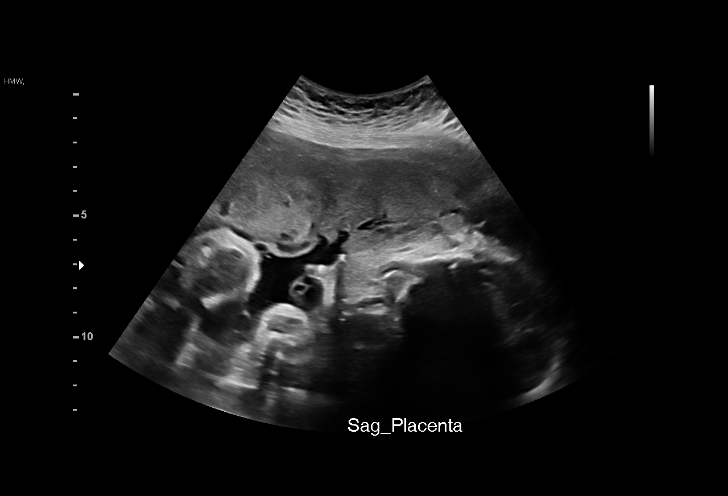
[im 33/47]
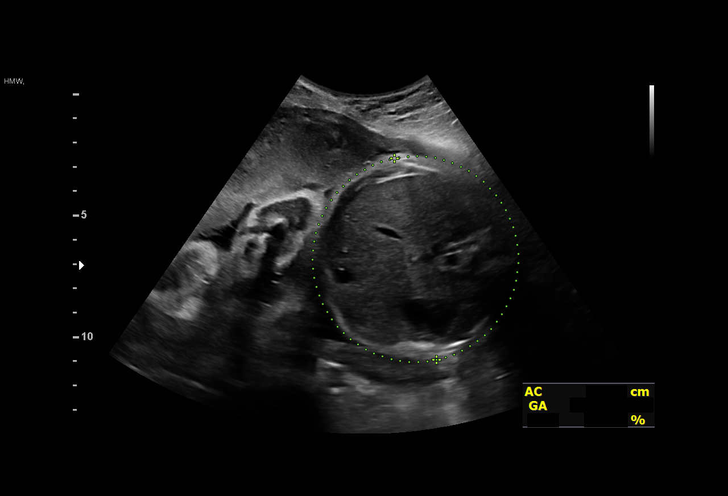
[im 36/47]
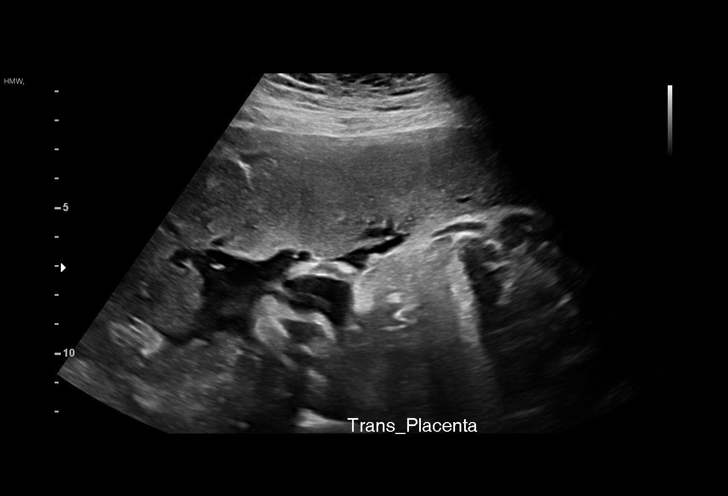
[im 40/47]
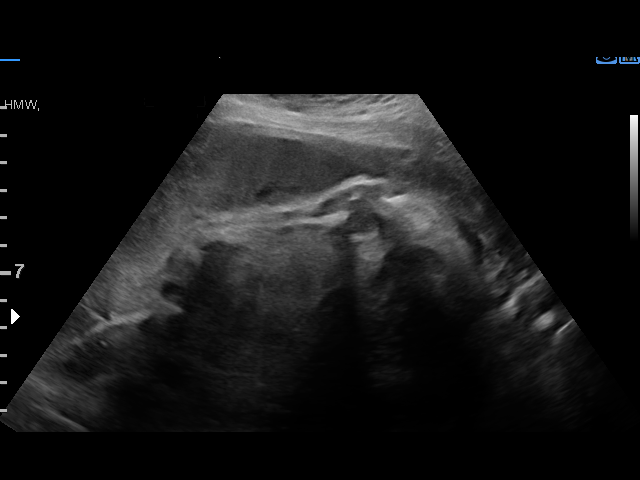
[im 43/47]
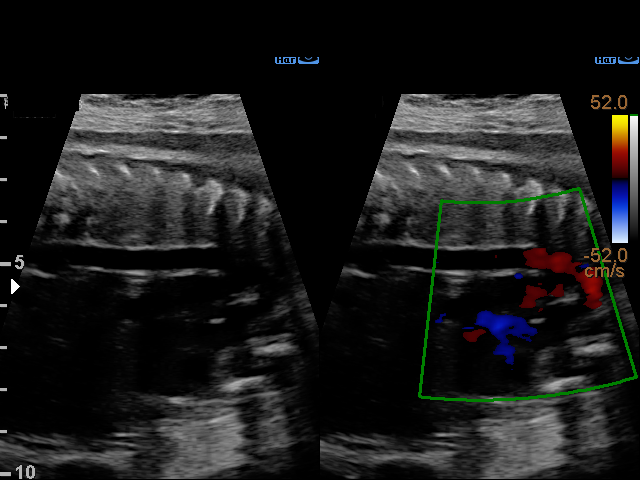
[im 47/47]
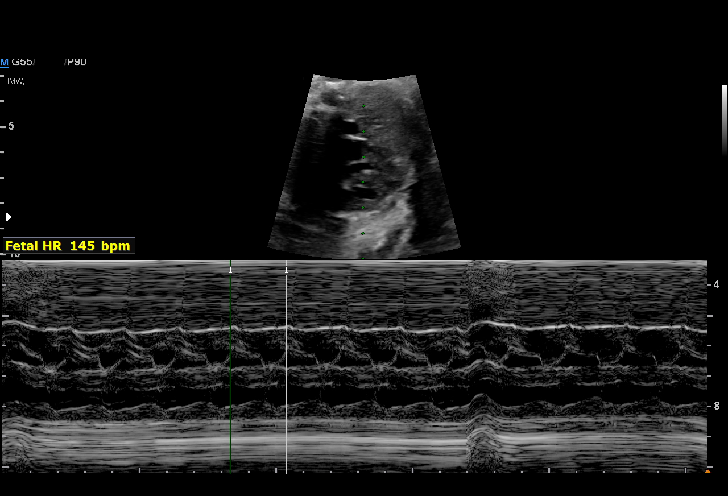

[14 of 28 positions shown; findings below may reference images not displayed]

[REDACTED]care

Indications

 Substance abuse affecting pregnancy,
 antepartum(Heroin/[place])
 30 weeks gestation of pregnancy
 Chronic Hepatitis C complicating pregnancy,
 antepartum
 Suboxone use
 Encounter for other antenatal screening
 follow-up
Fetal Evaluation

 Num Of Fetuses:         1
 Fetal Heart Rate(bpm):  145
 Cardiac Activity:       Observed
 Presentation:           Cephalic
 Placenta:               Anterior
 P. Cord Insertion:      Previously Visualized

 Amniotic Fluid
 AFI FV:      Subjectively decreased

 AFI Sum(cm)     %Tile       Largest Pocket(cm)


 RUQ(cm)       RLQ(cm)       LUQ(cm)        LLQ(cm)

Biophysical Evaluation
 Amniotic F.V:   Pocket => 2 cm             F. Tone:        Observed
 F. Movement:    Observed                   Score:          [DATE]
 F. Breathing:   Observed
Biometry

 BPD:      76.3  mm     G. Age:  30w 4d         39  %    CI:        72.82   %    70 - 86
                                                         FL/HC:      19.9   %    19.3 -
 HC:      284.3  mm     G. Age:  31w 1d         31  %    HC/AC:      1.07        0.96 -
 AC:      265.2  mm     G. Age:  30w 4d         48  %    FL/BPD:     74.2   %    71 - 87
 FL:       56.6  mm     G. Age:  29w 5d         15  %    FL/AC:      21.3   %    20 - 24

 Est. FW:    5699  gm      3 lb 7 oz     32  %
OB History

 Gravidity:    4         Term:   1        Prem:   1        SAB:   1
 Living:       2
Gestational Age

 LMP:           30w 4d        Date:  08/08/20                 EDD:   05/15/21
 Clinical EDD:  30w 4d                                        EDD:   05/15/21
 U/S Today:     30w 4d                                        EDD:   05/15/21
 Best:          30w 4d     Det. By:  LMP  (08/08/20)          EDD:   05/15/21
Anatomy

 Cranium:               Appears normal         Aortic Arch:            Appears normal
 Cavum:                 Appears normal         Ductal Arch:            Previously seen
 Ventricles:            Appears normal         Diaphragm:              Appears normal
 Choroid Plexus:        Previously seen        Stomach:                Appears normal, left
                                                                       sided
 Cerebellum:            Appears normal         Abdomen:                Appears normal
 Posterior Fossa:       Appears normal         Abdominal Wall:         Previously seen
 Nuchal Fold:           Previously seen        Cord Vessels:           Previously seen
 Face:                  Orbits nl; profile not Kidneys:                Previously seen
                        well visualized
 Lips:                  Appears normal         Bladder:                Appears normal
 Thoracic:              Appears normal         Spine:                  Ltd views no
                                                                       intracranial signs of
                                                                       NTD
 Heart:                 Not well visualized    Upper Extremities:      Appears normal
 RVOT:                  Not well visualized    Lower Extremities:      Appears normal
 LVOT:                  Appears normal

 Other:  Appears to be male. Technically difficult due to maternal habitus and
         fetal position.
Cervix Uterus Adnexa

 Cervix
 Not visualized (advanced GA >20wks)

 Uterus
 No abnormality visualized.

 Right Ovary
 No adnexal mass visualized.
 Left Ovary
 No adnexal mass visualized.

 Cul De Sac
 No free fluid seen.

 Adnexa
 No abnormality visualized.
Impression

 Follow up growth due to substance abuse with recent opioid
 relapse.
 Normal interval growth with measurements consistent with
 dates
 Good fetal movement and objectively normal amniotic fluid
 volume, but subjectively decreased amniotic fluid.
 Biophysical profile [DATE]
Recommendations

 Follow up growth in 4 weeks.

## 2023-02-02 ENCOUNTER — Ambulatory Visit (INDEPENDENT_AMBULATORY_CARE_PROVIDER_SITE_OTHER): Payer: 59 | Admitting: Family Medicine

## 2023-02-02 ENCOUNTER — Other Ambulatory Visit (HOSPITAL_COMMUNITY): Payer: Self-pay

## 2023-02-02 ENCOUNTER — Encounter: Payer: Self-pay | Admitting: Family Medicine

## 2023-02-02 ENCOUNTER — Encounter: Payer: Self-pay | Admitting: Oncology

## 2023-02-02 VITALS — BP 150/94 | HR 87

## 2023-02-02 DIAGNOSIS — F1120 Opioid dependence, uncomplicated: Secondary | ICD-10-CM

## 2023-02-02 DIAGNOSIS — L089 Local infection of the skin and subcutaneous tissue, unspecified: Secondary | ICD-10-CM | POA: Diagnosis not present

## 2023-02-02 DIAGNOSIS — K2100 Gastro-esophageal reflux disease with esophagitis, without bleeding: Secondary | ICD-10-CM | POA: Diagnosis not present

## 2023-02-02 DIAGNOSIS — R32 Unspecified urinary incontinence: Secondary | ICD-10-CM

## 2023-02-02 MED ORDER — DOXYCYCLINE HYCLATE 100 MG PO CAPS
100.0000 mg | ORAL_CAPSULE | Freq: Two times a day (BID) | ORAL | 0 refills | Status: AC
  Filled 2023-02-02: qty 14, 7d supply, fill #0

## 2023-02-02 MED ORDER — BUPRENORPHINE HCL-NALOXONE HCL 8-2 MG SL FILM
1.0000 | ORAL_FILM | Freq: Three times a day (TID) | SUBLINGUAL | 0 refills | Status: AC
  Filled 2023-02-02: qty 42, 14d supply, fill #0

## 2023-02-02 MED ORDER — OMEPRAZOLE 40 MG PO CPDR
40.0000 mg | DELAYED_RELEASE_CAPSULE | Freq: Every day | ORAL | 3 refills | Status: DC
  Filled 2023-02-02: qty 30, 30d supply, fill #0

## 2023-02-02 NOTE — Progress Notes (Signed)
GYNECOLOGY OFFICE VISIT NOTE  History:   Meghan Bullock is a 32 y.o. [phone] here today for OUD.  Patient feeling very ashamed as she reports she has returned to using fentanyl over the past month and a half Has not had methadone since the end of January  Using fentanyl, not exactly sure of how much Denies using any other substances Denies any sex work, getting money from FOB, other family members Endorses using IV over this time Also has had about four episodes of involuntary urination Denies happenign with coughing/laughing or having strong urge just prior Denies any loss of bowel function Denies fevers No weakness or numbness/tingling in lower extremities, no difficulties walking Does endorse diffuse areas of infection from injecting  Last used around 0500 today Would like to get on suboxone  Health Maintenance Due  Topic Date Due   COVID-19 Vaccine (1) Never done   INFLUENZA VACCINE  06/28/2022    Past Medical History:  Diagnosis Date   Anemia    Anxiety    Bipolar 1 disorder ([place])    Chlamydia    Hepatitis C    Heroin abuse ([place])    Mononucleosis    Ovarian cyst    Polysubstance abuse ([place])    Preterm premature rupture of membranes (PPROM) with unknown onset of labor 05/12/2014    Past Surgical History:  Procedure Laterality Date   HAND SURGERY     I & D EXTREMITY Left 09/16/2019   Procedure: IRRIGATION AND DEBRIDEMENT EXTREMITY;  Surgeon: Verner Mould, MD;  Location: [facility];  Service: Orthopedics;  Laterality: Left;    The following portions of the patient's history were reviewed and updated as appropriate: allergies, current medications, past family history, past medical history, past social history, past surgical history and problem list.   Health Maintenance:   Last pap: Lab Results  Component Value Date   DIAGPAP - Low grade squamous intraepithelial lesion (LSIL) (A) 07/13/2022   HPVHIGH Positive (A) 07/13/2022   Repeat pa in one  year  Last mammogram:  N/a    Review of Systems:  Pertinent items noted in HPI and remainder of comprehensive ROS otherwise negative.  Physical Exam:  BP (!) 150/94   Pulse 87  CONSTITUTIONAL: Well-developed, well-nourished female in no acute distress.  HEENT:  Normocephalic, atraumatic. External right and left ear normal. No scleral icterus.  NECK: Normal range of motion, supple, no masses noted on observation SKIN: diffuse track marks, area of dorsal R wrist with erythema and warm to touch measuring about 3x4 cm, additional scattered areas of induration across arms MUSCULOSKELETAL: Normal range of motion. No edema noted. NEUROLOGIC: strength 5/5 and sensation intact to light touch in bilateral lower extremities. Negative clonus bilaterally. Normal patellar reflexes bilaterally.   PSYCHIATRIC: Normal mood and affect. Normal behavior. Normal judgment and thought content. HEART: normal rate and rhythm, no murmurs appreciated RESPIRATORY: Effort normal, no problems with respiration noted, clear to auscultation PELVIC:  anal wink present  Labs and Imaging No results found for this or any previous visit (from the past 168 hour(s)). No results found.    Assessment and Plan:   Problem List Items Addressed This Visit       Digestive   Gastroesophageal reflux disease with esophagitis without hemorrhage    Refill sent per her request      Relevant Medications   omeprazole (PRILOSEC) 40 MG capsule     Other   Opioid use disorder, severe, dependence ([place]) - Primary  Significant relapse. Will initiate high dose suboxone induction protocol. Offered to have patient pick up rx and come back to clinic tomorrow but has immense transportation difficulties at present, and is planning to walk to the pharmacy. Rx sent to [facility] outpatient pharmacy, printed out a map, and gave her $10 as she does not have any so that she can get her prescription. Also contacted pharmacy to let them know what  the situation is and make sure she is able to get her rx.   Instructed her to wait to start until tomorrow morning and to take 16 mg immediately in the morning. If she feels fine after an hour can just do 16 mg daily until she comes back to clinic, if after an hour still having withdrawal symptoms she should take an additional 8 mg but not to take more than 24 mg daily until I see her again. Scheduled for follow up visit in 5 days.       Relevant Medications   Buprenorphine HCl-Naloxone HCl (SUBOXONE) 8-2 MG FILM   Other Relevant Orders   MR Lumbar Spine W Wo Contrast   Soft tissue infection    At least one area of cellulitis on her R dorsal wrist, and numerous other small infections on arms and legs. Rx sent for doxycycline.       Relevant Medications   doxycycline (VIBRAMYCIN) 100 MG capsule   Urinary incontinence    Unusual history. Primary concern is for epidural abscess. No murmurs or other stigmata of bacteremia appreciated on exam, would like to have gotten blood cultures but unfortunately we do not have the tubes here in clinic. Exam is reassuring without any neurological signs and outside of incontinence she does not have any red flags such as fever. Does have back pain but this is chronic. Overall I have a low but non zero suspicion for epidural abscess or other serious infection so I arranged for patient to have a stat outpatient MRI on Saturday at 12 PM at [facility]. I provided a printout with this appt on it. I repeatedly stressed how important it is to rule out this life threatening cause and to go to the ED immediately if she develops fever, bowel incontinence, weakness in her legs or difficulty walking.   Other ddx would be stress/urge incontinence but history she provided was not consistent with this. Could be overflow incontinence secondary to opioid constipation, will see what MRI shows. Prolapse or OAB are other considerations. Has short term follow up.       Relevant Orders    MR Lumbar Spine W Wo Contrast    Routine preventative health maintenance measures emphasized. Please refer to After Visit Summary for other counseling recommendations.   Return in about 5 days (around 02/07/2023) for OUD f/u.    Total face-to-face time with patient:  45  minutes.  Over 50% of encounter was spent on counseling and coordination of care.   Clarnce Flock, MD/MPH Attending Family Medicine Physician, [facility] for [facility], [place]

## 2023-02-02 NOTE — Assessment & Plan Note (Signed)
Refill sent per her request

## 2023-02-02 NOTE — Patient Instructions (Addendum)
You were seen for opioid use disorder.  I have prescribed suboxone. Wait to start taking it until tomorrow morning. Take two films (16 mg ) right away and wait one hour. If you feel better do not take more, and just start taking 2 films daily. If after one hour you do not feel relief from your symptoms, take one more film (total of 3 films, or 24 mg of suboxone). After that do not take more than 3 films daily until I see you again.   I have also sent a prescription for your skin infections. Take the antibiotic twice a day for seven days.   Finally, because of your bladder incontinence I have scheduled you an MRI for this Saturday at [facility] at 11:30 AM. Please be on time. It is very important that you go to this to rule out a life threatening spinal infection. If you develop a fever, weakness in your legs, inability to walk, or lose control of your bowels, go to the ED immediately.

## 2023-02-02 NOTE — Assessment & Plan Note (Addendum)
Significant relapse. Will initiate high dose suboxone induction protocol. Offered to have patient pick up rx and come back to clinic tomorrow but has immense transportation difficulties at present, and is planning to walk to the pharmacy. Rx sent to [facility] cone outpatient pharmacy, printed out a map, and gave her $10 as she does not have any so that she can get her prescription. Also contacted pharmacy to let them know what the situation is and make sure she is able to get her rx.   Instructed her to wait to start until tomorrow morning and to take 16 mg immediately in the morning. If she feels fine after an hour can just do 16 mg daily until she comes back to clinic, if after an hour still having withdrawal symptoms she should take an additional 8 mg but not to take more than 24 mg daily until I see her again. Scheduled for follow up visit in 5 days.

## 2023-02-02 NOTE — Assessment & Plan Note (Addendum)
Unusual history. Primary concern is for epidural abscess. No murmurs or other stigmata of bacteremia appreciated on exam, would like to have gotten blood cultures but unfortunately we do not have the tubes here in clinic. Exam is reassuring without any neurological signs and outside of incontinence she does not have any red flags such as fever. Does have back pain but this is chronic. Overall I have a low but non zero suspicion for epidural abscess or other serious infection so I arranged for patient to have a stat outpatient MRI on Saturday at 12 PM at [facility]. I provided a printout with this appt on it. I repeatedly stressed how important it is to rule out this life threatening cause and to go to the ED immediately if she develops fever, bowel incontinence, weakness in her legs or difficulty walking.   Other ddx would be stress/urge incontinence but history she provided was not consistent with this. Could be overflow incontinence secondary to opioid constipation, will see what MRI shows. Prolapse or OAB are other considerations. Has short term follow up.

## 2023-02-02 NOTE — Assessment & Plan Note (Signed)
At least one area of cellulitis on her R dorsal wrist, and numerous other small infections on arms and legs. Rx sent for doxycycline.

## 2023-02-03 ENCOUNTER — Encounter: Payer: Self-pay | Admitting: Oncology

## 2023-02-03 ENCOUNTER — Other Ambulatory Visit (HOSPITAL_COMMUNITY): Payer: Self-pay

## 2023-02-04 ENCOUNTER — Ambulatory Visit (HOSPITAL_COMMUNITY): Payer: Medicaid Other

## 2023-02-07 ENCOUNTER — Encounter: Payer: Medicaid Other | Admitting: Family Medicine

## 2023-02-21 ENCOUNTER — Ambulatory Visit (INDEPENDENT_AMBULATORY_CARE_PROVIDER_SITE_OTHER): Payer: Medicaid Other | Admitting: Family Medicine

## 2023-02-21 ENCOUNTER — Encounter: Payer: Self-pay | Admitting: Family Medicine

## 2023-02-21 VITALS — BP 111/77 | HR 93 | Ht 65.0 in | Wt 178.0 lb

## 2023-02-21 DIAGNOSIS — F419 Anxiety disorder, unspecified: Secondary | ICD-10-CM | POA: Diagnosis not present

## 2023-02-21 DIAGNOSIS — F1120 Opioid dependence, uncomplicated: Secondary | ICD-10-CM

## 2023-02-21 DIAGNOSIS — G2581 Restless legs syndrome: Secondary | ICD-10-CM | POA: Diagnosis not present

## 2023-02-21 DIAGNOSIS — D509 Iron deficiency anemia, unspecified: Secondary | ICD-10-CM | POA: Diagnosis not present

## 2023-02-21 DIAGNOSIS — K2100 Gastro-esophageal reflux disease with esophagitis, without bleeding: Secondary | ICD-10-CM

## 2023-02-21 MED ORDER — CYCLOBENZAPRINE HCL 10 MG PO TABS: 10.0000 mg | ORAL_TABLET | Freq: Three times a day (TID) | ORAL | 5 refills | Status: DC | PRN

## 2023-02-21 MED ORDER — OMEPRAZOLE 40 MG PO CPDR: 40.0000 mg | DELAYED_RELEASE_CAPSULE | Freq: Every day | ORAL | 3 refills | Status: DC

## 2023-02-21 MED ORDER — BUPRENORPHINE HCL-NALOXONE HCL 8-2 MG SL FILM: 1.0000 | ORAL_FILM | Freq: Four times a day (QID) | SUBLINGUAL | 0 refills | Status: DC

## 2023-02-21 MED ORDER — GABAPENTIN 600 MG PO TABS: 600.0000 mg | ORAL_TABLET | Freq: Three times a day (TID) | ORAL | 5 refills | Status: DC

## 2023-02-21 NOTE — Progress Notes (Unsigned)
GYNECOLOGY OFFICE VISIT NOTE  History:   Meghan Bullock is a 32 y.o. [phone] here today for OUD follow up.  Missed appt last week Three weeks ago presented to clinic without visit reporting major relapse Also reported some symptoms concerning for possible spinal infection, those have resolved as of today. Of note she was scheduled for MRI but did not attend the appointment Reports she is taking suboxone, two films in AM and one film in PM However has been trying to make it stretch over the past week since missing our appointment When she does have enough she reports she is feeling like it is insufficient for the end of the day and she starts to feel withdrawal symptoms  Health Maintenance Due  Topic Date Due   COVID-19 Vaccine (1) Never done   INFLUENZA VACCINE  06/28/2022    Past Medical History:  Diagnosis Date   Anemia    Anxiety    Bipolar 1 disorder ([place])    Chlamydia    Hepatitis C    Heroin abuse ([place])    Mononucleosis    Ovarian cyst    Polysubstance abuse ([place])    Preterm premature rupture of membranes (PPROM) with unknown onset of labor 05/12/2014    Past Surgical History:  Procedure Laterality Date   HAND SURGERY     I & D EXTREMITY Left 09/16/2019   Procedure: IRRIGATION AND DEBRIDEMENT EXTREMITY;  Surgeon: Verner Mould, MD;  Location: [facility];  Service: Orthopedics;  Laterality: Left;    The following portions of the patient's history were reviewed and updated as appropriate: allergies, current medications, past family history, past medical history, past social history, past surgical history and problem list.   Health Maintenance:   Last pap: Lab Results  Component Value Date   DIAGPAP - Low grade squamous intraepithelial lesion (LSIL) (A) 07/13/2022   HPVHIGH Positive (A) 07/13/2022   Repeat pap in 1 year per [facility]  Last mammogram:  N/a    Review of Systems:  Pertinent items noted in HPI and remainder of comprehensive ROS otherwise  negative.  Physical Exam:  BP 111/77   Pulse 93   Ht 5\' 5"  (1.651 m)   Wt 178 lb (80.7 kg)   LMP  (LMP Unknown)   Breastfeeding No   BMI 29.62 kg/m  CONSTITUTIONAL: Well-developed, well-nourished female in no acute distress.  HEENT:  Normocephalic, atraumatic. External right and left ear normal. No scleral icterus.  NECK: Normal range of motion, supple, no masses noted on observation SKIN: No rash noted. Not diaphoretic. No erythema. No pallor. MUSCULOSKELETAL: Normal range of motion. No edema noted. NEUROLOGIC: Alert and oriented to person, place, and time. Normal muscle tone coordination.  PSYCHIATRIC: Normal mood and affect. Normal behavior. Normal judgment and thought content. RESPIRATORY: Effort normal, no problems with respiration noted  Labs and Imaging No results found for this or any previous visit (from the past 168 hour(s)). No results found.    Assessment and Plan:   Problem List Items Addressed This Visit       Digestive   Gastroesophageal reflux disease with esophagitis without hemorrhage   Relevant Medications   omeprazole (PRILOSEC) 40 MG capsule     Other   Anxiety   Relevant Medications   gabapentin (NEURONTIN) 600 MG tablet   Iron deficiency anemia   Relevant Medications   cyclobenzaprine (FLEXERIL) 10 MG tablet   Opioid use disorder, severe, dependence ([place]) - Primary    Reports she has  not used fentanyl since seeing me last. Requested dose increase which we will trial for the next two weeks. To see me back in 2 weeks. UDS collected.       Relevant Medications   Buprenorphine HCl-Naloxone HCl (SUBOXONE) 8-2 MG FILM   Other Relevant Orders   ToxAssure Flex 15, Ur   Restless leg syndrome   Relevant Medications   gabapentin (NEURONTIN) 600 MG tablet    Routine preventative health maintenance measures emphasized. Please refer to After Visit Summary for other counseling recommendations.   Return in about 2 weeks (around 03/07/2023) for OUD f/u.     Total face-to-face time with patient: 20 minutes.  Over 50% of encounter was spent on counseling and coordination of care.   Clarnce Flock, MD/MPH Attending Family Medicine Physician, [facility] for [facility], [place]

## 2023-02-22 ENCOUNTER — Encounter: Payer: Self-pay | Admitting: Family Medicine

## 2023-02-22 ENCOUNTER — Other Ambulatory Visit (HOSPITAL_COMMUNITY): Payer: Self-pay

## 2023-02-22 ENCOUNTER — Encounter: Payer: Self-pay | Admitting: Oncology

## 2023-02-22 MED ORDER — BUPRENORPHINE HCL-NALOXONE HCL 8-2 MG SL FILM
1.0000 | ORAL_FILM | Freq: Four times a day (QID) | SUBLINGUAL | 0 refills | Status: DC
  Filled 2023-02-22: qty 56, 19d supply, fill #0

## 2023-02-22 NOTE — Assessment & Plan Note (Signed)
Reports she has not used fentanyl since seeing me last. Requested dose increase which we will trial for the next two weeks. To see me back in 2 weeks. UDS collected.

## 2023-02-27 ENCOUNTER — Other Ambulatory Visit (HOSPITAL_COMMUNITY): Payer: Self-pay

## 2023-02-27 LAB — TOXASSURE FLEX 15, UR
6-ACETYLMORPHINE IA: NEGATIVE ng/mL
7-aminoclonazepam: NOT DETECTED ng/mg creat
AMPHETAMINES IA: NEGATIVE ng/mL
Alpha-hydroxyalprazolam: NOT DETECTED ng/mg creat
Alpha-hydroxymidazolam: NOT DETECTED ng/mg creat
Alpha-hydroxytriazolam: NOT DETECTED ng/mg creat
Alprazolam: NOT DETECTED ng/mg creat
BARBITURATES IA: NEGATIVE ng/mL
BUPRENORPHINE: POSITIVE
Benzodiazepines: NEGATIVE
Buprenorphine: >1136 ng/mg creat
Clonazepam: NOT DETECTED ng/mg creat
Creatinine: 88 mg/dL
Desalkylflurazepam: NOT DETECTED ng/mg creat
Desmethyldiazepam: NOT DETECTED ng/mg creat
Desmethylflunitrazepam: NOT DETECTED ng/mg creat
Diazepam: NOT DETECTED ng/mg creat
ETHYL ALCOHOL Enzymatic: NEGATIVE g/dL
FENTANYL: POSITIVE
Fentanyl: 426 ng/mg creat
Flunitrazepam: NOT DETECTED ng/mg creat
Lorazepam: NOT DETECTED ng/mg creat
METHADONE IA: NEGATIVE ng/mL
METHADONE MTB IA: NEGATIVE ng/mL
Midazolam: NOT DETECTED ng/mg creat
Norbuprenorphine: 56 ng/mg creat
Norfentanyl: >1136 ng/mg creat
OXYCODONE CLASS IA: NEGATIVE ng/mL
Oxazepam: NOT DETECTED ng/mg creat
PHENCYCLIDINE IA: NEGATIVE ng/mL
TAPENTADOL, IA: NEGATIVE ng/mL
TRAMADOL IA: NEGATIVE ng/mL
Temazepam: NOT DETECTED ng/mg creat

## 2023-02-27 LAB — OPIATE CLASS, MS, UR RFX
Codeine: NOT DETECTED ng/mg creat
Dihydrocodeine: NOT DETECTED ng/mg creat
Hydrocodone: NOT DETECTED ng/mg creat
Hydromorphone: 70 ng/mg creat
Morphine: 7122 ng/mg creat
Norcodeine: NOT DETECTED ng/mg creat
Norhydrocodone: NOT DETECTED ng/mg creat
Normorphine: 338 ng/mg creat
Opiate Class Confirmation: POSITIVE

## 2023-02-27 LAB — COCAINE AND MTB, MS, UR RFX
Benzoylecgonine: >5682 ng/mg creat
Cocaethylene: NOT DETECTED ng/mg creat
Cocaine Confirmation: POSITIVE
Cocaine: 556 ng/mg creat

## 2023-02-27 LAB — CANNABINOIDS, MS, UR RFX
Cannabinoids Confirmation: POSITIVE
Carboxy-THC: 89 ng/mg creat

## 2023-03-07 ENCOUNTER — Ambulatory Visit: Payer: Medicaid Other | Admitting: Family Medicine

## 2023-03-15 ENCOUNTER — Ambulatory Visit (INDEPENDENT_AMBULATORY_CARE_PROVIDER_SITE_OTHER): Payer: Medicaid Other | Admitting: Family Medicine

## 2023-03-15 ENCOUNTER — Encounter: Payer: Self-pay | Admitting: Family Medicine

## 2023-03-15 ENCOUNTER — Other Ambulatory Visit (HOSPITAL_COMMUNITY): Payer: Self-pay

## 2023-03-15 VITALS — BP 142/90 | HR 103 | Ht 63.0 in | Wt 173.3 lb

## 2023-03-15 DIAGNOSIS — R87612 Low grade squamous intraepithelial lesion on cytologic smear of cervix (LGSIL): Secondary | ICD-10-CM | POA: Diagnosis not present

## 2023-03-15 DIAGNOSIS — Z79899 Other long term (current) drug therapy: Secondary | ICD-10-CM

## 2023-03-15 DIAGNOSIS — F1990 Other psychoactive substance use, unspecified, uncomplicated: Secondary | ICD-10-CM | POA: Diagnosis not present

## 2023-03-15 DIAGNOSIS — Z5181 Encounter for therapeutic drug level monitoring: Secondary | ICD-10-CM

## 2023-03-15 DIAGNOSIS — F1120 Opioid dependence, uncomplicated: Secondary | ICD-10-CM | POA: Diagnosis not present

## 2023-03-15 MED ORDER — BUPRENORPHINE HCL-NALOXONE HCL 8-2 MG SL FILM
2.0000 | ORAL_FILM | Freq: Two times a day (BID) | SUBLINGUAL | 0 refills | Status: DC
  Filled 2023-03-15: qty 64, 22d supply, fill #0

## 2023-03-15 NOTE — Assessment & Plan Note (Signed)
Monitor closely  Plan to repeat pap in August 2024

## 2023-03-15 NOTE — Assessment & Plan Note (Addendum)
Patient is 1 week sober from fentanyl Applauded this amazing hard work. Patient seemed encouraged by her progress. Stable on current Suboxone, no withdrawal sx Currently taking  AM and  PM.  She is requesting refill and agree with provision I have given her 16 days of medication to get her to 2 days past her scheduled appt with Dr Crissie Reese  She declined blood bourne infection screenings today, plans to get at later time.

## 2023-03-15 NOTE — Progress Notes (Signed)
GYNECOLOGY OFFICE VISIT NOTE  History:   Meghan Bullock is a 32 y.o. [phone] here today for refill of suboxone  Patient is currently under therapy with Dr. Zack Seal, last refill was on 3/26 for 2 weeks. She is currently takign suboxone 2 films AM/1Film PM. She reports medicaid is not covering 2 Films BID. She last used IV fentanyl 1 week ago. As far as she knows there was only Fentanyl in what she used. She reports doing well back on Suboxone.   Health Maintenance Due  Topic Date Due   COVID-19 Vaccine (1) Never done    Past Medical History:  Diagnosis Date   Anemia    Anxiety    Bipolar 1 disorder    Chlamydia    Hepatitis C    Heroin abuse    Mononucleosis    Ovarian cyst    Polysubstance abuse    Preterm premature rupture of membranes (PPROM) with unknown onset of labor 05/12/2014    Past Surgical History:  Procedure Laterality Date   HAND SURGERY     I & D EXTREMITY Left 09/16/2019   Procedure: IRRIGATION AND DEBRIDEMENT EXTREMITY;  Surgeon: Ernest Mallick, MD;  [facility];  Service: Orthopedics;  Laterality: Left;    The following portions of the patient's history were reviewed and updated as appropriate: allergies, current medications, past family history, past medical history, past social history, past surgical history and problem list.   Health Maintenance:   Last pap: Lab Results  Component Value Date   DIAGPAP - Low grade squamous intraepithelial lesion (LSIL) (A) 07/13/2022   HPVHIGH Positive (A) 07/13/2022     Last mammogram:  N/a    Review of Systems:  Pertinent items noted in HPI and remainder of comprehensive ROS otherwise negative.  Physical Exam:  BP (!) 142/90   Pulse (!) 103   Ht  (1.6 m)   Wt 173 lb 4.8 oz (78.6 kg)   LMP  (LMP Unknown)   Breastfeeding No   BMI 30.70 kg/m  CONSTITUTIONAL: Well-developed, well-nourished female in no acute distress.  HEENT:  Normocephalic, atraumatic. External right and left  ear normal. No scleral icterus.  OF note patient had sever amblyopia of right eye NECK: Normal range of motion, supple, no masses noted on observation SKIN: No rash noted. Not diaphoretic. No erythema. No pallor. MUSCULOSKELETAL: Normal range of motion. No edema noted. NEUROLOGIC: Alert and oriented to person, place, and time. Normal muscle tone coordination.  PSYCHIATRIC: Scattered thought process. Sad mood and affect. RESPIRATORY: Effort normal, no problems with respiration noted PELVIC: Deferred  Labs and Imaging No results found for this or any previous visit (from the past 168 hour(s)).  No results found.    Assessment and Plan:   Problem List Items Addressed This Visit       Unprioritized   LGSIL on Pap smear of cervix    Monitor closely  Plan to repeat pap in August 2024      Opioid use disorder, severe, dependence    Patient is 1 week sober from fentanyl Applauded this amazing hard work. Patient seemed encouraged by her progress. Stable on current Suboxone, no withdrawal sx Currently taking  AM and  PM.  She is requesting refill and agree with provision I have given her 16 days of medication to get her to 2 days past her scheduled appt with Dr Crissie Reese  She declined blood bourne infection screenings today, plans to get at later time.  Relevant Medications   Buprenorphine HCl-Naloxone HCl (SUBOXONE) 8-2 MG FILM   Substance use disorder - Primary   Relevant Orders   ToxAssure Flex 15, Ur   Other Visit Diagnoses     Encounter for monitoring Suboxone maintenance therapy       Relevant Orders   ToxAssure Flex 15, Ur       Routine preventative health maintenance measures emphasized. Please refer to After Visit Summary for other counseling recommendations.   Return in about 13 days (around 03/28/2023) for SUD follow up with Dr. Crissie Reese.    Total face-to-face time with patient: 30 minutes.  Over 50% of encounter was spent on counseling and coordination  of care.   Federico Flake, MD/MPH Attending Family Medicine Physician, [facility] for [facility], [facility]

## 2023-03-28 ENCOUNTER — Ambulatory Visit (INDEPENDENT_AMBULATORY_CARE_PROVIDER_SITE_OTHER): Payer: Medicaid Other | Admitting: Family Medicine

## 2023-03-28 ENCOUNTER — Encounter: Payer: Self-pay | Admitting: Family Medicine

## 2023-03-28 ENCOUNTER — Other Ambulatory Visit (HOSPITAL_COMMUNITY): Payer: Self-pay

## 2023-03-28 VITALS — BP 163/97 | HR 121 | Ht 65.0 in | Wt 169.1 lb

## 2023-03-28 DIAGNOSIS — G2581 Restless legs syndrome: Secondary | ICD-10-CM

## 2023-03-28 DIAGNOSIS — F1120 Opioid dependence, uncomplicated: Secondary | ICD-10-CM | POA: Diagnosis not present

## 2023-03-28 DIAGNOSIS — F419 Anxiety disorder, unspecified: Secondary | ICD-10-CM | POA: Diagnosis not present

## 2023-03-28 MED ORDER — BUPRENORPHINE HCL-NALOXONE HCL 8-2 MG SL FILM
1.0000 | ORAL_FILM | Freq: Three times a day (TID) | SUBLINGUAL | 0 refills | Status: AC
  Filled 2023-03-28 – 2023-03-30 (×3): qty 90, 30d supply, fill #0

## 2023-03-28 MED ORDER — GABAPENTIN 600 MG PO TABS
600.0000 mg | ORAL_TABLET | Freq: Three times a day (TID) | ORAL | 5 refills | Status: DC
Start: 2023-03-28 — End: 2023-05-01
  Filled 2023-03-28: qty 90, 30d supply, fill #0

## 2023-03-28 NOTE — Progress Notes (Signed)
[facility] GYNECOLOGY OFFICE VISIT NOTE  History:   Meghan Bullock is a 32 y.o. [phone] here today for OUD follow up.  Doing much better since last visit Seen by Dr. Alvester Morin two weeks prior, resumed on suboxone Reports she is taking 8 mg TID and doing well Also needs gabapentin refill  Health Maintenance Due  Topic Date Due   COVID-19 Vaccine (1) Never done    Past Medical History:  Diagnosis Date   Anemia    Anxiety    Bipolar 1 disorder ([facility])    Chlamydia    Hepatitis C    Heroin abuse ([facility])    Mononucleosis    Ovarian cyst    Polysubstance abuse ([facility])    Preterm premature rupture of membranes (PPROM) with unknown onset of labor 05/12/2014    Past Surgical History:  Procedure Laterality Date   HAND SURGERY     I & D EXTREMITY Left 09/16/2019   Procedure: IRRIGATION AND DEBRIDEMENT EXTREMITY;  Surgeon: Ernest Mallick, MD;  Location: [facility];  Service: Orthopedics;  Laterality: Left;    The following portions of the patient's history were reviewed and updated as appropriate: allergies, current medications, past family history, past medical history, past social history, past surgical history and problem list.   Health Maintenance:   Last pap: Lab Results  Component Value Date   DIAGPAP - Low grade squamous intraepithelial lesion (LSIL) (A) 07/13/2022   HPVHIGH Positive (A) 07/13/2022   Repeat pap in 1 year per [facility]  Last mammogram:  N/a    Review of Systems:  Pertinent items noted in HPI and remainder of comprehensive ROS otherwise negative.  Physical Exam:  BP (!) 163/97   Pulse (!) 121   Ht 5\' 5"  (1.651 m)   Wt 169 lb 1.6 oz (76.7 kg)   LMP  (LMP Unknown)   Breastfeeding No   BMI 28.14 kg/m  CONSTITUTIONAL: Well-developed, well-nourished female in no acute distress.  HEENT:  Normocephalic, atraumatic. External right and left ear normal. No scleral icterus.  NECK: Normal range of motion, supple, no masses noted on  observation SKIN: No rash noted. Not diaphoretic. No erythema. No pallor. MUSCULOSKELETAL: Normal range of motion. No edema noted. NEUROLOGIC: Alert and oriented to person, place, and time. Normal muscle tone coordination.  PSYCHIATRIC: Normal mood and affect. Normal behavior. Normal judgment and thought content. RESPIRATORY: Effort normal, no problems with respiration noted   Labs and Imaging No results found for this or any previous visit (from the past 168 hour(s)). No results found.    Assessment and Plan:   Problem List Items Addressed This Visit       Other   Anxiety   Relevant Medications   gabapentin (NEURONTIN) 600 MG tablet   Opioid use disorder, severe, dependence ([facility]) - Primary   Relevant Medications   Buprenorphine HCl-Naloxone HCl (SUBOXONE) 8-2 MG FILM   Other Relevant Orders   ToxAssure Flex 15, Ur   Restless leg syndrome   Relevant Medications   gabapentin (NEURONTIN) 600 MG tablet   Doing well UDS collected Refill sent  Routine preventative health maintenance measures emphasized. Please refer to After Visit Summary for other counseling recommendations.   Return in about 4 weeks (around 04/25/2023) for OUD f/u.    Total face-to-face time with patient: 15 minutes.  Over 50% of encounter was spent on counseling and coordination of care.   Venora Maples, MD/MPH Attending Family Medicine Physician, [facility] for [facility], [place]

## 2023-03-29 ENCOUNTER — Encounter: Payer: Self-pay | Admitting: Family Medicine

## 2023-03-30 ENCOUNTER — Other Ambulatory Visit: Payer: Self-pay

## 2023-03-30 ENCOUNTER — Other Ambulatory Visit (HOSPITAL_COMMUNITY): Payer: Self-pay

## 2023-03-30 DIAGNOSIS — F1120 Opioid dependence, uncomplicated: Secondary | ICD-10-CM

## 2023-03-31 ENCOUNTER — Telehealth: Payer: Self-pay

## 2023-03-31 NOTE — Telephone Encounter (Addendum)
VM left on nurse line on 03/30/23 from [facility] pharmacy stating patient is at pharmacy to pick up Suboxone films. Pharmacist believes this is too early to fill. Patient received 64 films on 03/15/23 and is taking TID. This should last 21 days, which would indicate pick up on 04/05/23. Per chart review, seen by Crissie Reese MD on 03/28/23 and patient reported taking TID. UDS done that day. Will route to provider for review.

## 2023-04-18 ENCOUNTER — Encounter: Payer: Self-pay | Admitting: Oncology

## 2023-04-25 ENCOUNTER — Ambulatory Visit: Payer: 59 | Admitting: Family Medicine

## 2023-05-01 ENCOUNTER — Encounter: Payer: Self-pay | Admitting: Oncology

## 2023-05-01 ENCOUNTER — Ambulatory Visit (INDEPENDENT_AMBULATORY_CARE_PROVIDER_SITE_OTHER): Payer: 59 | Admitting: Family Medicine

## 2023-05-01 ENCOUNTER — Other Ambulatory Visit (HOSPITAL_COMMUNITY): Payer: Self-pay

## 2023-05-01 ENCOUNTER — Encounter: Payer: Self-pay | Admitting: Family Medicine

## 2023-05-01 VITALS — BP 129/86 | HR 129 | Ht 64.0 in | Wt 169.0 lb

## 2023-05-01 DIAGNOSIS — F419 Anxiety disorder, unspecified: Secondary | ICD-10-CM

## 2023-05-01 DIAGNOSIS — F1120 Opioid dependence, uncomplicated: Secondary | ICD-10-CM | POA: Diagnosis not present

## 2023-05-01 DIAGNOSIS — D509 Iron deficiency anemia, unspecified: Secondary | ICD-10-CM

## 2023-05-01 DIAGNOSIS — K2100 Gastro-esophageal reflux disease with esophagitis, without bleeding: Secondary | ICD-10-CM | POA: Diagnosis not present

## 2023-05-01 DIAGNOSIS — G2581 Restless legs syndrome: Secondary | ICD-10-CM

## 2023-05-01 DIAGNOSIS — M542 Cervicalgia: Secondary | ICD-10-CM

## 2023-05-01 MED ORDER — OMEPRAZOLE 40 MG PO CPDR
40.0000 mg | DELAYED_RELEASE_CAPSULE | Freq: Every day | ORAL | 3 refills | Status: DC
Start: 2023-05-01 — End: 2024-01-23
  Filled 2023-05-01: qty 45, 45d supply, fill #0
  Filled 2023-08-14 (×2): qty 45, 45d supply, fill #1

## 2023-05-01 MED ORDER — BUPRENORPHINE HCL-NALOXONE HCL 8-2 MG SL FILM
1.0000 | ORAL_FILM | Freq: Three times a day (TID) | SUBLINGUAL | 0 refills | Status: DC
Start: 2023-05-01 — End: 2023-05-15
  Filled 2023-05-01: qty 49, 17d supply, fill #0

## 2023-05-01 MED ORDER — CYCLOBENZAPRINE HCL 10 MG PO TABS
10.0000 mg | ORAL_TABLET | Freq: Three times a day (TID) | ORAL | 5 refills | Status: DC | PRN
Start: 2023-05-01 — End: 2024-08-27
  Filled 2023-05-01: qty 60, 20d supply, fill #0
  Filled 2023-05-15: qty 60, 20d supply, fill #1

## 2023-05-01 MED ORDER — GABAPENTIN 600 MG PO TABS
600.0000 mg | ORAL_TABLET | Freq: Three times a day (TID) | ORAL | 5 refills | Status: DC
Start: 2023-05-01 — End: 2024-01-23
  Filled 2023-05-01: qty 90, 30d supply, fill #0
  Filled 2023-06-14 – 2023-06-22 (×2): qty 90, 30d supply, fill #1
  Filled 2023-08-14 (×2): qty 90, 30d supply, fill #2

## 2023-05-01 NOTE — Progress Notes (Signed)
OUD Walk In PROBLEM  VISIT ENCOUNTER NOTE  Subjective:   Meghan Bullock is a 32 y.o. [phone] female here for a problem GYN visit.  Current complaints:   Last bought Subocxone film off someone and thus does not think it was laced with anything. This was on Saturday 6/1, got out of jail on 5/31. She was on subutex while there at the jail.  She saw her kids over the weekend and really wants to get clean. Gioia reported her suboxone was taken when she went into jail. Also last fill was in the end of April so she is due for a refill anyways. She is using 8mg  TID but feels a 4th film would help.   Also needs refills on all her medications, again they were taken when she was arrested.   She also reports a longstanding pain in upper thoracis/lower cervical spine at the nape of her neck with a bulging/fat pad. She reports the pain and swelling comes and goes but has been worse recently.   Denies abnormal vaginal bleeding, discharge, pelvic pain, problems with intercourse or other gynecologic concerns.    Gynecologic History No LMP recorded (lmp unknown). Patient has had an implant.  Contraception: Nexplanon  Health Maintenance Due  Topic Date Due   COVID-19 Vaccine (1) Never done    The following portions of the patient's history were reviewed and updated as appropriate: allergies, current medications, past family history, past medical history, past social history, past surgical history and problem list.  Review of Systems Pertinent items are noted in HPI.   Objective:  BP 129/86   Pulse (!) 129   Ht 5\' 4"  (1.626 m)   Wt 169 lb (76.7 kg)   LMP  (LMP Unknown)   BMI 29.01 kg/m  Gen: well appearing, NAD HEENT: no scleral icterus CV: RR Lung: Normal WOB Ext: warm well perfused  Neuro: CN2-12 intact but has significant amblyopia on right. 5/5 strength in upper and lower extremities.   Assessment and Plan:  1. Opioid use disorder, severe, dependence ([facility]) - Applauded her  engagement and desire to work on her sobriety - She trusts Dr Crissie Reese and wants to engage more in her care with him - Discussed with patient I would give 2 weeks of medication, to which she initially said "you can't do a month" and that I would not increase to 4 films a day yet. She accepted this and I discussed it with Dr. Crissie Reese.  - I am giving her enough films for 3.5 films per day until seen by Dr Crissie Reese - ToxAssure Flex 15, Ur - MR CERVICAL SPINE W WO CONTRAST; Future - MR THORACIC SPINE W WO CONTRAST; Future - Buprenorphine HCl-Naloxone HCl 8-2 MG FILM; Place 1 Film under the tongue 3 (three) times daily. May take 1/2 film later in the day if needed,  RX allows for 1/2 film daily until seen by Dr. Crissie Reese  Dispense: 49 each; Refill: 0  6. Cervicalgia I am concerned about this vague pain in the posterior neck spine. Given history of IVDU concerning for abscess but no focal neuro sx currently.  - MR CERVICAL SPINE W WO CONTRAST; Future - MR THORACIC SPINE W WO CONTRAST; Future - Buprenorphine HCl-Naloxone HCl 8-2 MG FILM; Place 1 Film under the tongue 3 (three) times daily. May take 1/2 film later in the day if needed,  RX allows for 1/2 film daily until seen by Dr. Crissie Reese  Dispense: 49 each; Refill: 0  Please refer to After Visit Summary for other counseling recommendations.   Return for OUD with [facility].  Future Appointments  Date Time Provider Department Center  05/12/2023 10:15 AM Venora Maples, MD [facility]     Federico Flake, MD, MPH, ABFM Attending Physician Faculty Practice- Center for [facility]

## 2023-05-01 NOTE — Progress Notes (Unsigned)
Last Subutex Rx used on 5/31. [facility] [employer] on Sat.  Judeth Cornfield, RNC

## 2023-05-12 ENCOUNTER — Other Ambulatory Visit (HOSPITAL_COMMUNITY): Payer: Self-pay

## 2023-05-12 ENCOUNTER — Encounter: Payer: 59 | Admitting: Family Medicine

## 2023-05-15 ENCOUNTER — Ambulatory Visit (INDEPENDENT_AMBULATORY_CARE_PROVIDER_SITE_OTHER): Payer: 59 | Admitting: Family Medicine

## 2023-05-15 ENCOUNTER — Other Ambulatory Visit (HOSPITAL_COMMUNITY): Payer: Self-pay

## 2023-05-15 ENCOUNTER — Encounter: Payer: Self-pay | Admitting: Family Medicine

## 2023-05-15 VITALS — BP 126/78 | HR 97 | Ht 64.0 in | Wt 169.0 lb

## 2023-05-15 DIAGNOSIS — R21 Rash and other nonspecific skin eruption: Secondary | ICD-10-CM | POA: Diagnosis not present

## 2023-05-15 DIAGNOSIS — F1120 Opioid dependence, uncomplicated: Secondary | ICD-10-CM

## 2023-05-15 DIAGNOSIS — M542 Cervicalgia: Secondary | ICD-10-CM

## 2023-05-15 MED ORDER — BUPRENORPHINE HCL-NALOXONE HCL 8-2 MG SL FILM
1.0000 | ORAL_FILM | Freq: Four times a day (QID) | SUBLINGUAL | 0 refills | Status: DC
  Filled 2023-05-15: qty 60, 20d supply, fill #0

## 2023-05-15 MED ORDER — TRIAMCINOLONE ACETONIDE 0.5 % EX OINT
1.0000 | TOPICAL_OINTMENT | Freq: Two times a day (BID) | CUTANEOUS | 0 refills | Status: DC
Start: 2023-05-15 — End: 2024-08-27
  Filled 2023-05-15: qty 30, 10d supply, fill #0

## 2023-05-15 NOTE — Progress Notes (Signed)
OUD Walk In PROBLEM  VISIT ENCOUNTER NOTE  Subjective:   Meghan Bullock is a 32 y.o. [phone] female here for a problem GYN visit.  Current complaints:   No show Friday 6/14 for medication follow up with Dr Crissie Reese-- Meghan Bullock walked into the [facility] today asking to be seen.  She reports she did not know about her 6/14 appointment.  She was last seen on 6/3. Over the weekend, she split her doses to help her get through to today. She is currently using 3.5 films but used 4 films on some days and feel this greatly helped her cravings/desire to use non-prescribed opiates. She has not had suboxone since yesterday.  She has not used anything except her prescribed suboxone. She reports still needing her gabapentin and acid reflux medications.   Rash-- present since being in prison 5/31. Worsening. Tired OTC acne creams and washes.     Gynecologic History No LMP recorded (lmp unknown). Patient has had an implant.  Contraception: Nexplanon  Health Maintenance Due  Topic Date Due   COVID-19 Vaccine (1) Never done   PAP SMEAR-Modifier  07/14/2023    The following portions of the patient's history were reviewed and updated as appropriate: allergies, current medications, past family history, past medical history, past social history, past surgical history and problem list.  Review of Systems Pertinent items are noted in HPI.   Objective:  BP 126/78   Pulse 97   Ht 5\' 4"  (1.626 m)   Wt 169 lb (76.7 kg)   LMP  (LMP Unknown)   BMI 29.01 kg/m  Gen: well appearing, NAD. Agitated and irritable.  HEENT: no scleral icterus CV: RR Lung: Normal WOB Ext: warm well perfused Skin: pustular rash on neck L>R.   Neuro: CN2-12 intact but has significant amblyopia on right. 5/5 strength in upper and lower extremities.   Assessment and Plan:   1. Opioid use disorder, severe, dependence (HCC) Discussed patient patient her missed appt on 6/14 and she reports "I wasn't aware of that appointment  because MyChart is all messed up" I reiterated kindly that we had discussed her 6/14 appt at our visit together on 6/3 Reviewed that we need to see her regularly to safely provide medication I am willing to provide 4 films daily until her 7/2 appointment with Dr. Crissie Reese. I verbally confirmed with patient so patient is aware of her 7/2 appointment.  I appreciate her coming to see [place] today and applauded her not using or buying medications today.   I reassured patient her other medications were sent on 6/3 and she will need to call the pharmacy to get these filled. She had multiple refills of the medications.   - ToxAssure Flex 15, Ur - Buprenorphine HCl-Naloxone HCl 8-2 MG FILM; Place 1 Film under the tongue in the morning, at noon, in the evening, and at bedtime for 15 days. Has appt with Dr. Crissie Reese 05/30/23  Dispense: 60 Film; Refill: 0  2. Rash and other nonspecific skin eruption Appears like allergic dermatitis Will trial steroid cream to area - triamcinolone ointment (KENALOG) 0.5 %; Apply 1 Application topically 2 (two) times daily.  Dispense: 30 g; Refill: 0   Please refer to After Visit Summary for other counseling recommendations.   No follow-ups on file.  Future Appointments  Date Time Provider Department Center  05/30/2023 10:35 AM Crissie Reese, Mary Sella, MD [facility]      Federico Flake, MD, MPH, ABFM Attending Physician Faculty Practice- Center for [facility]  [facility]

## 2023-05-21 LAB — TOXASSURE FLEX 15, UR
6-ACETYLMORPHINE IA: NEGATIVE ng/mL
7-aminoclonazepam: NOT DETECTED ng/mg creat
AMPHETAMINES IA: NEGATIVE ng/mL
Alpha-hydroxyalprazolam: NOT DETECTED ng/mg creat
Alpha-hydroxymidazolam: NOT DETECTED ng/mg creat
Alpha-hydroxytriazolam: NOT DETECTED ng/mg creat
Alprazolam: NOT DETECTED ng/mg creat
BARBITURATES IA: NEGATIVE ng/mL
BUPRENORPHINE: POSITIVE
Benzodiazepines: NEGATIVE
Buprenorphine: >526 ng/mg creat
Clonazepam: NOT DETECTED ng/mg creat
Creatinine: 190 mg/dL
Desalkylflurazepam: NOT DETECTED ng/mg creat
Desmethyldiazepam: NOT DETECTED ng/mg creat
Desmethylflunitrazepam: NOT DETECTED ng/mg creat
Diazepam: NOT DETECTED ng/mg creat
ETHYL ALCOHOL Enzymatic: POSITIVE g/dL
FENTANYL: POSITIVE
Fentanyl: >263 ng/mg creat
Flunitrazepam: NOT DETECTED ng/mg creat
Lorazepam: NOT DETECTED ng/mg creat
METHADONE IA: NEGATIVE ng/mL
METHADONE MTB IA: NEGATIVE ng/mL
Midazolam: NOT DETECTED ng/mg creat
Norbuprenorphine: 71 ng/mg creat
Norfentanyl: 496 ng/mg creat
OXYCODONE CLASS IA: NEGATIVE ng/mL
Oxazepam: NOT DETECTED ng/mg creat
PHENCYCLIDINE IA: NEGATIVE ng/mL
TAPENTADOL, IA: NEGATIVE ng/mL
TRAMADOL IA: NEGATIVE ng/mL
Temazepam: NOT DETECTED ng/mg creat

## 2023-05-21 LAB — COCAINE AND MTB, MS, UR RFX
Benzoylecgonine: 1948 ng/mg creat
Cocaethylene: NOT DETECTED ng/mg creat
Cocaine Confirmation: POSITIVE
Cocaine: 1369 ng/mg creat

## 2023-05-21 LAB — OPIATE CLASS, MS, UR RFX
Codeine: NOT DETECTED ng/mg creat
Dihydrocodeine: NOT DETECTED ng/mg creat
Hydrocodone: NOT DETECTED ng/mg creat
Hydromorphone: NOT DETECTED ng/mg creat
Morphine: 61 ng/mg creat
Norcodeine: NOT DETECTED ng/mg creat
Norhydrocodone: NOT DETECTED ng/mg creat
Normorphine: NOT DETECTED ng/mg creat
Opiate Class Confirmation: POSITIVE

## 2023-05-21 LAB — ALCOHOL, ETHYL, UR, QT
Alcohol, Ethyl: 0.022 g/dL
Alcohol, Ethyl: POSITIVE

## 2023-05-21 LAB — CANNABINOIDS, MS, UR RFX
Cannabinoids Confirmation: POSITIVE
Carboxy-THC: 44 ng/mg creat

## 2023-05-29 ENCOUNTER — Other Ambulatory Visit (HOSPITAL_COMMUNITY): Payer: Self-pay

## 2023-05-30 ENCOUNTER — Encounter: Payer: 59 | Admitting: Family Medicine

## 2023-06-02 ENCOUNTER — Telehealth: Payer: Self-pay | Admitting: Family Medicine

## 2023-06-02 ENCOUNTER — Encounter: Payer: Self-pay | Admitting: Oncology

## 2023-06-02 ENCOUNTER — Encounter: Payer: Self-pay | Admitting: Family Medicine

## 2023-06-02 ENCOUNTER — Ambulatory Visit (INDEPENDENT_AMBULATORY_CARE_PROVIDER_SITE_OTHER): Payer: 59 | Admitting: Family Medicine

## 2023-06-02 ENCOUNTER — Other Ambulatory Visit (HOSPITAL_COMMUNITY): Payer: Self-pay

## 2023-06-02 VITALS — BP 116/75 | HR 99 | Ht 64.0 in | Wt 152.3 lb

## 2023-06-02 DIAGNOSIS — M542 Cervicalgia: Secondary | ICD-10-CM

## 2023-06-02 DIAGNOSIS — F1120 Opioid dependence, uncomplicated: Secondary | ICD-10-CM | POA: Diagnosis not present

## 2023-06-02 DIAGNOSIS — F1990 Other psychoactive substance use, unspecified, uncomplicated: Secondary | ICD-10-CM | POA: Diagnosis not present

## 2023-06-02 MED ORDER — BUPRENORPHINE HCL-NALOXONE HCL 8-2 MG SL FILM
1.0000 | ORAL_FILM | Freq: Four times a day (QID) | SUBLINGUAL | 0 refills | Status: AC
  Filled 2023-06-02: qty 120, 30d supply, fill #0

## 2023-06-02 NOTE — Progress Notes (Signed)
GYNECOLOGY OFFICE VISIT NOTE  History:   Meghan Bullock is a 32 y.o. [phone] here today for OUD follow up.  I last saw patient on 03/28/2023, reported stability at that time Saw dr. Alvester Morin on 05/01/23, reporting relapse and incarceration at that time Seen again 05/15/23, reported running out and requesting increase to 4 films daily Last fill that day for 60 films (20 day supply for TID dosing)  Today reports taking usually four a day and doing well Reports only taking suboxone and gabapentin, no other substances   Health Maintenance Due  Topic Date Due   COVID-19 Vaccine (1) Never done   PAP SMEAR-Modifier  07/14/2023    Past Medical History:  Diagnosis Date   Anemia    Anxiety    Bipolar 1 disorder (HCC)    Chlamydia    Hepatitis C    Heroin abuse (HCC)    Mononucleosis    Ovarian cyst    Polysubstance abuse (HCC)    Preterm premature rupture of membranes (PPROM) with unknown onset of labor 05/12/2014    Past Surgical History:  Procedure Laterality Date   HAND SURGERY     I & D EXTREMITY Left 09/16/2019   Procedure: IRRIGATION AND DEBRIDEMENT EXTREMITY;  Surgeon: Ernest Mallick, MD;  Location: [facility];  Service: Orthopedics;  Laterality: Left;    The following portions of the patient's history were reviewed and updated as appropriate: allergies, current medications, past family history, past medical history, past social history, past surgical history and problem list.   Health Maintenance:   Last pap: Lab Results  Component Value Date   DIAGPAP - Low grade squamous intraepithelial lesion (LSIL) (A) 07/13/2022   HPVHIGH Positive (A) 07/13/2022   Repeat next month  Last mammogram:  N/a    Review of Systems:  Pertinent items noted in HPI and remainder of comprehensive ROS otherwise negative.  Physical Exam:  BP 116/75   Pulse 99   Ht 5\' 4"  (1.626 m)   Wt 152 lb 4.8 oz (69.1 kg)   LMP 05/02/2023 (Approximate)   BMI 26.14 kg/m  CONSTITUTIONAL:  Well-developed, well-nourished female in no acute distress.  HEENT:  Normocephalic, atraumatic. External right and left ear normal. No scleral icterus.  NECK: Normal range of motion, supple, no masses noted on observation SKIN: No rash noted. Not diaphoretic. No erythema. No pallor. MUSCULOSKELETAL: Normal range of motion. No edema noted. NEUROLOGIC: Alert and oriented to person, place, and time. Normal muscle tone coordination.  PSYCHIATRIC: Normal mood and affect. Normal behavior. Normal judgment and thought content. RESPIRATORY: Effort normal, no problems with respiration noted   Labs and Imaging No results found for this or any previous visit (from the past 168 hour(s)). No results found.    Assessment and Plan:   Problem List Items Addressed This Visit       Other   Opioid use disorder, severe, dependence (HCC) - Primary    Reports doing better than last visit, denies use of any illicits Stressed importance of coming to scheduled visits (no showed a few with me) Helped her get her MyChart sorted UDS sent Refill sent Back in one month, also needs pap at that time      Relevant Medications   Buprenorphine HCl-Naloxone HCl 8-2 MG FILM   Other Relevant Orders   ToxAssure Flex 15, Ur   Substance use disorder   Relevant Orders   ToxAssure Flex 15, Ur   Other Visit Diagnoses  Cervicalgia       Relevant Medications   Buprenorphine HCl-Naloxone HCl 8-2 MG FILM       Routine preventative health maintenance measures emphasized. Please refer to After Visit Summary for other counseling recommendations.   Return in about 4 weeks (around 06/30/2023) for OUD f/u, repeat pap.    Total face-to-face time with patient: 20 minutes.  Over 50% of encounter was spent on counseling and coordination of care.   Venora Maples, MD/MPH Attending Family Medicine Physician, [facility], [facility]

## 2023-06-02 NOTE — Assessment & Plan Note (Signed)
Reports doing better than last visit, denies use of any illicits Stressed importance of coming to scheduled visits (no showed a few with me) Helped her get her MyChart sorted UDS sent Refill sent Back in one month, also needs pap at that time

## 2023-06-02 NOTE — Telephone Encounter (Signed)
Patient reporting issues with suboxone rx at pharmacy, [place] please reach out to [facility] outpatient pharmacy to see how we can assist, thanks.

## 2023-06-08 LAB — TOXASSURE FLEX 15, UR
7-aminoclonazepam: NOT DETECTED ng/mg creat
AMPHETAMINES IA: NEGATIVE ng/mL
Alpha-hydroxyalprazolam: NOT DETECTED ng/mg creat
Alpha-hydroxymidazolam: NOT DETECTED ng/mg creat
Alpha-hydroxytriazolam: NOT DETECTED ng/mg creat
Alprazolam: NOT DETECTED ng/mg creat
BARBITURATES IA: NEGATIVE ng/mL
BUPRENORPHINE: POSITIVE
Benzodiazepines: NEGATIVE
Buprenorphine: >339 ng/mg creat
Clonazepam: NOT DETECTED ng/mg creat
Creatinine: 295 mg/dL
Desalkylflurazepam: NOT DETECTED ng/mg creat
Desmethyldiazepam: NOT DETECTED ng/mg creat
Desmethylflunitrazepam: NOT DETECTED ng/mg creat
Diazepam: NOT DETECTED ng/mg creat
ETHYL ALCOHOL Enzymatic: NEGATIVE g/dL
FENTANYL: POSITIVE
Fentanyl: >169 ng/mg creat
Flunitrazepam: NOT DETECTED ng/mg creat
Lorazepam: NOT DETECTED ng/mg creat
METHADONE IA: NEGATIVE ng/mL
METHADONE MTB IA: NEGATIVE ng/mL
Midazolam: NOT DETECTED ng/mg creat
Norbuprenorphine: 23 ng/mg creat
Norfentanyl: >339 ng/mg creat
OXYCODONE CLASS IA: NEGATIVE ng/mL
Oxazepam: NOT DETECTED ng/mg creat
PHENCYCLIDINE IA: NEGATIVE ng/mL
TAPENTADOL, IA: NEGATIVE ng/mL
TRAMADOL IA: NEGATIVE ng/mL
Temazepam: NOT DETECTED ng/mg creat

## 2023-06-08 LAB — COCAINE AND MTB, MS, UR RFX
Benzoylecgonine: >1695 ng/mg creat
Cocaethylene: NOT DETECTED ng/mg creat
Cocaine Confirmation: POSITIVE
Cocaine: 1085 ng/mg creat

## 2023-06-08 LAB — CANNABINOIDS, MS, UR RFX
Cannabinoids Confirmation: POSITIVE
Carboxy-THC: 14 ng/mg creat

## 2023-06-08 LAB — 6-ACETYLMORPHINE, MS, UR RFX
6-Acetylmorphine Confirmation: POSITIVE
6-acetylmorphine: 4 ng/mg creat

## 2023-06-08 LAB — OPIATE CLASS, MS, UR RFX
Codeine: NOT DETECTED ng/mg creat
Dihydrocodeine: NOT DETECTED ng/mg creat
Hydrocodone: NOT DETECTED ng/mg creat
Hydromorphone: NOT DETECTED ng/mg creat
Morphine: 390 ng/mg creat
Norcodeine: NOT DETECTED ng/mg creat
Norhydrocodone: NOT DETECTED ng/mg creat
Normorphine: 18 ng/mg creat
Opiate Class Confirmation: POSITIVE

## 2023-06-14 ENCOUNTER — Other Ambulatory Visit (HOSPITAL_BASED_OUTPATIENT_CLINIC_OR_DEPARTMENT_OTHER): Payer: Self-pay

## 2023-06-22 ENCOUNTER — Other Ambulatory Visit (HOSPITAL_COMMUNITY): Payer: Self-pay

## 2023-06-22 ENCOUNTER — Other Ambulatory Visit (HOSPITAL_BASED_OUTPATIENT_CLINIC_OR_DEPARTMENT_OTHER): Payer: Self-pay

## 2023-06-22 ENCOUNTER — Other Ambulatory Visit: Payer: Self-pay | Admitting: Family Medicine

## 2023-06-22 DIAGNOSIS — M542 Cervicalgia: Secondary | ICD-10-CM

## 2023-06-22 DIAGNOSIS — F1120 Opioid dependence, uncomplicated: Secondary | ICD-10-CM

## 2023-06-27 ENCOUNTER — Encounter: Payer: 59 | Admitting: Family Medicine

## 2023-06-29 ENCOUNTER — Other Ambulatory Visit (HOSPITAL_COMMUNITY): Payer: Self-pay

## 2023-07-03 ENCOUNTER — Telehealth: Payer: Self-pay | Admitting: Family Medicine

## 2023-07-03 ENCOUNTER — Other Ambulatory Visit (HOSPITAL_COMMUNITY): Payer: Self-pay

## 2023-07-03 MED ORDER — BUPRENORPHINE HCL-NALOXONE HCL 8-2 MG SL FILM
1.0000 | ORAL_FILM | Freq: Four times a day (QID) | SUBLINGUAL | 0 refills | Status: AC
Start: 2023-07-03 — End: 2023-07-18
  Filled 2023-07-03: qty 56, 14d supply, fill #0

## 2023-07-03 NOTE — Telephone Encounter (Signed)
Partner Trey Paula showed up to [facility] today to ask about suboxone prescription. Patient was not with him. He reported she was sick. He spoke to the front desk.   At last appointment reported no use of illicit substance use on 7/5  Toxassure positive for: Fentanyl, Morphine, and Cocaine  Patient was supposed to come for a visit this week but did not schedule.    Need to discuss with the patient  -- plan for 2 week supply to support her recovery and I have sent in the 2 week supply -- patient must come to the office for toxicology screen and visit.  -- I will not provide additional prescriptions without an in person visit.   Awaiting call back from patient to discuss the plan.

## 2023-07-04 ENCOUNTER — Other Ambulatory Visit: Payer: Self-pay

## 2023-07-04 ENCOUNTER — Other Ambulatory Visit: Payer: 59

## 2023-07-04 DIAGNOSIS — F1120 Opioid dependence, uncomplicated: Secondary | ICD-10-CM

## 2023-07-04 DIAGNOSIS — D509 Iron deficiency anemia, unspecified: Secondary | ICD-10-CM

## 2023-07-04 DIAGNOSIS — E538 Deficiency of other specified B group vitamins: Secondary | ICD-10-CM

## 2023-07-05 ENCOUNTER — Other Ambulatory Visit: Payer: 59

## 2023-07-05 NOTE — Telephone Encounter (Signed)
I tried to contact patient regarding insufficient urine sample. There was no answer and I was unable to leave message and mailbox was full.

## 2023-07-25 ENCOUNTER — Ambulatory Visit: Payer: 59 | Admitting: Family Medicine

## 2023-07-25 ENCOUNTER — Encounter: Payer: 59 | Admitting: Family Medicine

## 2023-08-03 ENCOUNTER — Ambulatory Visit: Payer: 59 | Admitting: Family Medicine

## 2023-08-14 ENCOUNTER — Other Ambulatory Visit: Payer: Self-pay | Admitting: Family Medicine

## 2023-08-14 ENCOUNTER — Other Ambulatory Visit: Payer: 59

## 2023-08-14 ENCOUNTER — Other Ambulatory Visit (HOSPITAL_COMMUNITY): Payer: Self-pay

## 2023-08-14 DIAGNOSIS — F1990 Other psychoactive substance use, unspecified, uncomplicated: Secondary | ICD-10-CM

## 2023-08-14 DIAGNOSIS — F1120 Opioid dependence, uncomplicated: Secondary | ICD-10-CM | POA: Diagnosis not present

## 2023-08-14 MED ORDER — BUPRENORPHINE HCL-NALOXONE HCL 8-2 MG SL FILM
1.0000 | ORAL_FILM | Freq: Four times a day (QID) | SUBLINGUAL | 0 refills | Status: AC
Start: 2023-08-14 — End: 2023-08-24
  Filled 2023-08-14: qty 40, 10d supply, fill #0

## 2023-08-14 NOTE — Progress Notes (Signed)
Patient showed up at [facility] and is currently out of medication. She missed her appointment in August and was not scheduled for today.  She was added to the lab schedule for Toxasure.  I have asked her to give urine drug screening and once the sample was provided I will sent in 10 day supply to the [facility].   Patient was told about her visit with Dr. Crissie Reese on 08/22/23. She has not been seen since 06/02/23  She was scheduled for:  Future Appointments  Date Time Provider Department Center  08/14/2023 10:30 AM [facility]  08/22/2023  1:15 PM Venora Maples, MD [facility]

## 2023-08-22 ENCOUNTER — Encounter: Payer: 59 | Admitting: Family Medicine

## 2023-08-23 ENCOUNTER — Encounter: Payer: Self-pay | Admitting: Oncology

## 2023-08-24 ENCOUNTER — Other Ambulatory Visit (HOSPITAL_COMMUNITY): Payer: Self-pay

## 2023-08-29 ENCOUNTER — Ambulatory Visit: Payer: 59 | Admitting: Family Medicine

## 2023-09-06 NOTE — BH Specialist Note (Deleted)
Integrated Behavioral Health Initial In-Person Visit  MRN: [id number] Name: Meghan Bullock  Number of Integrated Behavioral Health Clinician visits: No data recorded Session Start time: No data recorded   Session End time: No data recorded Total time in minutes: No data recorded  Types of Service: {CHL AMB TYPE OF [phone]}  Interpretor:No. Interpretor Name and Language: n/a   Warm Hand Off Completed.        Subjective: Meghan Bullock is a 32 y.o. female accompanied by {CHL AMB ACCOMPANIED WU:[id number]} Patient was referred by Merian Capron, MD for ***. Patient reports the following symptoms/concerns: *** Duration of problem: ***; Severity of problem: {Mild/Moderate/Severe:20260}  Objective: Mood: {BHH MOOD:22306} and Affect: {BHH AFFECT:22307} Risk of harm to self or others: {CHL AMB BH Suicide Current Mental Status:21022748}  Life Context: Family and Social: *** School/Work: *** Self-Care: *** Life Changes: ***  Patient and/or Family's Strengths/Protective Factors: {CHL AMB BH PROTECTIVE FACTORS:[phone]}  Goals Addressed: Patient will: Reduce symptoms of: {IBH Symptoms:21014056} Increase knowledge and/or ability of: {IBH Patient Tools:21014057}  Demonstrate ability to: {IBH Goals:21014053}  Progress towards Goals: {CHL AMB BH PROGRESS TOWARDS GOALS:[phone]}  Interventions: Interventions utilized: {IBH Interventions:21014054}  Standardized Assessments completed: {IBH Screening Tools:21014051}  Patient and/or Family Response: ***  Patient Centered Plan: Patient is on the following Treatment Plan(s):  ***  Assessment: Patient currently experiencing ***.   Patient may benefit from ***.  Plan: Follow up with behavioral health clinician on : *** Behavioral recommendations: *** Referral(s): {IBH Referrals:21014055}  Rae Lips, LCSW     09/21/2022   11:39 AM 07/13/2022    9:24 AM 02/04/2022   11:11 AM 12/07/2021     4:15 PM 07/09/2021    9:54 AM  Depression screen PHQ 2/9  Decreased Interest 0  0 0 0  Down, Depressed, Hopeless 0  0 1 0  PHQ - 2 Score 0  0 1 0  Altered sleeping 1  2 1  0  Tired, decreased energy 1  1 1 1   Change in appetite 0  1 1 1   Feeling bad or failure about yourself  0  0 0 0  Trouble concentrating 0  1 1 2   Moving slowly or fidgety/restless 0  0 0 0  Suicidal thoughts 0  0 0 0  PHQ-9 Score 2  5 5 4   Difficult doing work/chores          Information is confidential and restricted. Go to Review Flowsheets to unlock data.      09/21/2022   11:39 AM 07/13/2022    9:24 AM 02/04/2022   11:11 AM 12/07/2021    4:15 PM  GAD 7 : Generalized Anxiety Score  Nervous, Anxious, on Edge 0  1 1  Control/stop worrying 0  1 1  Worry too much - different things 0  0 1  Trouble relaxing 1  1 1   Restless 0  1 1  Easily annoyed or irritable 0  0 1  Afraid - awful might happen 0  0 1  Total GAD 7 Score 1  4 7   Anxiety Difficulty         Information is confidential and restricted. Go to Review Flowsheets to unlock data.       05/25/2021   10:22 AM 04/21/2021    9:00 PM 04/21/2021    8:23 AM  Edinburgh Postnatal Depression Scale Screening Tool  I have been able to laugh and see the funny side of things. 0 0 --  I have looked  forward with enjoyment to things. 1 0   I have blamed myself unnecessarily when things went wrong. 1 2   I have been anxious or worried for no good reason. 2 3   I have felt scared or panicky for no good reason. 3 3   Things have been getting on top of me. 3 2   I have been so unhappy that I have had difficulty sleeping. 0 1   I have felt sad or miserable. 1 1   I have been so unhappy that I have been crying. 0 1   The thought of harming myself has occurred to me. 0 0   Edinburgh Postnatal Depression Scale Total 11 13

## 2023-09-07 ENCOUNTER — Encounter: Payer: Self-pay | Admitting: Oncology

## 2023-09-07 ENCOUNTER — Telehealth: Payer: Self-pay

## 2023-09-07 ENCOUNTER — Ambulatory Visit: Payer: 59 | Admitting: Family Medicine

## 2023-09-07 NOTE — Telephone Encounter (Signed)
Called pt regarding missed visit; VM full, unable to leave message.

## 2023-09-29 ENCOUNTER — Other Ambulatory Visit (HOSPITAL_COMMUNITY): Payer: Self-pay

## 2023-10-10 ENCOUNTER — Telehealth: Payer: Self-pay | Admitting: *Deleted

## 2023-10-10 NOTE — Telephone Encounter (Signed)
Discussed with Dr. Crissie Reese he is not able to talk with Mr. Meghan Bullock. I called back Mr. Meghan Bullock and left  a message we got his message and returning his call, if he or Jannett need to talk with [place], please call again. [name]

## 2023-10-10 NOTE — Telephone Encounter (Signed)
Received a phone message from female stating he is Meghan Bullock and is trying to get in touch with Dr. Crissie Reese or someone call him back about her prescription medication. [name]

## 2024-01-23 ENCOUNTER — Ambulatory Visit (INDEPENDENT_AMBULATORY_CARE_PROVIDER_SITE_OTHER): Payer: MEDICAID | Admitting: Family Medicine

## 2024-01-23 ENCOUNTER — Ambulatory Visit: Payer: MEDICAID | Admitting: Family Medicine

## 2024-01-23 VITALS — BP 134/81 | HR 134 | Wt 186.2 lb

## 2024-01-23 DIAGNOSIS — F1120 Opioid dependence, uncomplicated: Secondary | ICD-10-CM | POA: Diagnosis not present

## 2024-01-23 DIAGNOSIS — K2100 Gastro-esophageal reflux disease with esophagitis, without bleeding: Secondary | ICD-10-CM

## 2024-01-23 DIAGNOSIS — R87612 Low grade squamous intraepithelial lesion on cytologic smear of cervix (LGSIL): Secondary | ICD-10-CM

## 2024-01-23 DIAGNOSIS — G2581 Restless legs syndrome: Secondary | ICD-10-CM | POA: Diagnosis not present

## 2024-01-23 DIAGNOSIS — G8929 Other chronic pain: Secondary | ICD-10-CM

## 2024-01-23 DIAGNOSIS — F419 Anxiety disorder, unspecified: Secondary | ICD-10-CM

## 2024-01-23 DIAGNOSIS — M545 Low back pain, unspecified: Secondary | ICD-10-CM

## 2024-01-23 MED ORDER — BUPRENORPHINE HCL-NALOXONE HCL 8-2 MG SL FILM: 1.0000 | ORAL_FILM | Freq: Four times a day (QID) | SUBLINGUAL | 0 refills | Status: DC

## 2024-01-23 MED ORDER — GABAPENTIN 600 MG PO TABS
600.0000 mg | ORAL_TABLET | Freq: Three times a day (TID) | ORAL | 5 refills | Status: DC
Start: 2024-01-23 — End: 2024-08-27

## 2024-01-23 MED ORDER — OMEPRAZOLE 40 MG PO CPDR
40.0000 mg | DELAYED_RELEASE_CAPSULE | Freq: Every day | ORAL | 3 refills | Status: DC
Start: 2024-01-23 — End: 2024-08-27

## 2024-01-23 NOTE — Progress Notes (Unsigned)
 Meghan Bullock is a 33 y.o. [phone] here today for OUD follow up.  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 Was released four days ago Has 2 years probation, otherwise goes back for 6 years Was on subutex 8 TID while incarcerated and felt that it was grossly inadequate to treat her withdrawal symptoms She does endorse relapse over the weekend but is highly motivated to be engaged with treatment Requests going up to 8 mg QID and would like to go back on suboxone instead of subutex  Health Maintenance Due  Topic Date Due   COVID-19 Vaccine (1) Never done   Pneumococcal Vaccine 40-51 Years old (1 of 2 - PCV) Never done   INFLUENZA VACCINE  06/29/2023   Cervical Cancer Screening (Pap smear)  07/14/2023    Past Medical History:  Diagnosis Date   Anemia    Anxiety    Bipolar 1 disorder (HCC)    Chlamydia    Hepatitis C    Heroin abuse (HCC)    Mononucleosis    Ovarian cyst    Polysubstance abuse (HCC)    Preterm premature rupture of membranes (PPROM) with unknown onset of labor 05/12/2014    Past Surgical History:  Procedure Laterality Date   HAND SURGERY     I & D EXTREMITY Left 09/16/2019   Procedure: IRRIGATION AND DEBRIDEMENT EXTREMITY;  Surgeon: Ernest Mallick, MD;  Location: [facility] OR;  Service: Orthopedics;  Laterality: Left;    The following portions of the patient's history were reviewed and updated as appropriate: allergies, current medications, past family history, past medical history, past social history, past surgical history and problem list.   Health Maintenance:   Last pap:  Result Date Procedure Results Follow-ups  07/13/2022 Cytology - PAP( [place]) High risk HPV: Positive (A) Comment: Normal Reference Range HPV - Negative Comment: Normal Reference Ranger Chlamydia - Negative Comment: Normal Reference Range Neisseria Gonorrhea - Negative Neisseria Gonorrhea: Negative Chlamydia: Negative Trichomonas: Negative Adequacy:  Satisfactory for evaluation; transformation zone component PRESENT. Diagnosis: - Low grade squamous intraepithelial lesion (LSIL) (A) Comment: There are a few cells suggestive of a higher grade lesion. Clinical Comment: correlation is recommended. Comment: Normal Reference Range Trichomonas - Negative   07/09/2021 Surgical pathology( [place]/ [facility]) SURGICAL PATHOLOGY: SURGICAL PATHOLOGY CASE: [id number] PATIENT: [facility] Surgical Pathology Report     Clinical History: ASCUS with positive high risk HPV (cm)     FINAL MICROSCOPIC DIAGNOSIS:  A. CERVIX, 12 AND 6 O'CLOCK, BIOPSY: - Low-grade sq...   12/24/2020 Cytology - PAP( [place]) Chlamydia: Negative Neisseria Gonorrhea: Negative High risk HPV: Positive (A) Adequacy: Satisfactory for evaluation; transformation zone component PRESENT. Diagnosis: - Atypical squamous cells of undetermined significance (ASC-US) (A) Comment: Normal Reference Ranger Chlamydia - Negative Comment: Normal Reference Range HPV - Negative Comment: Normal Reference Range Neisseria Gonorrhea - Negative   10/18/2017 Pap Lb, Ct-Ng, rfx HPV ASCU DIAGNOSIS:: Comment Specimen adequacy:: Comment Clinician Provided ICD10: Comment Performed by:: Comment PAP Smear Comment: . Note:: Comment PAP Reflex: Comment Chlamydia, Nuc. Acid Amp: Negative Gonococcus, Nuc. Acid Amp: Negative    Due for repeat pap  Last mammogram:  N/a    Hepatitis serologies: No results found for: "HAV", "HEPAIGM", "HEPBIGM", "HEPBCAB", "HBEAG", "HEPCAB"  Hep A Immunization: n/a, previously given or already immune  Hep B Immunization: n/a, previously given or already immune  Last LFTs: Lab Results  Component Value Date   ALT 10 04/22/2021   AST 18 04/22/2021  ALKPHOS 121 04/22/2021   BILITOT 0.7 04/22/2021     Review of Systems:  Pertinent items noted in HPI and remainder of comprehensive ROS otherwise negative.  Physical Exam:  BP 134/81    Pulse (!) 134   Wt 186 lb 4 oz (84.5 kg)   BMI 31.97 kg/m  CONSTITUTIONAL: somewhat disheveled  HEENT:  Normocephalic, atraumatic. External right and left ear normal. No scleral icterus.  NECK: Normal range of motion, supple, no masses noted on observation SKIN: No rash noted. Not diaphoretic. No erythema. No pallor. MUSCULOSKELETAL: Normal range of motion. No edema noted. NEUROLOGIC: Alert and oriented to person, place, and time. Normal muscle tone coordination.  PSYCHIATRIC: speaking a million miles a minute per usual but c/w other times I have seen her.  RESPIRATORY: Effort normal, no problems with respiration noted  Labs and Imaging I have reviewed the PDMP during this encounter.    Last UDS: Lab Results  Component Value Date   PD2 FINAL 08/14/2023   CREATIUR 110 08/14/2023     No results found for this or any previous visit (from the past week). No results found.      Assessment and Plan:   Problem List Items Addressed This Visit       Digestive   Gastroesophageal reflux disease with esophagitis without hemorrhage   Has been on PPI for ages. At this point definitely needs an EGD to evaluate for why her symptoms are so persistent, will address at next visit.       Relevant Medications   omeprazole (PRILOSEC) 40 MG capsule     Other   Anxiety   Requested refill of gabapentin, sent to her pharmacy.       Relevant Medications   gabapentin (NEURONTIN) 600 MG tablet   Chronic bilateral low back pain without sciatica   Relevant Medications   Buprenorphine HCl-Naloxone HCl 8-2 MG FILM   gabapentin (NEURONTIN) 600 MG tablet   LGSIL on Pap smear of cervix   Overdue for pap since 06/2023, will address at next visit.       Opioid use disorder, severe, dependence (HCC) - Primary   Have not seen Kailene for sometime, I was unaware she was incarcerated. Do not have an issue going up to QID dosing for suboxone but discussed with her that I need to see her back in a week  to review UDS results. She was not thrilled about this but agreed with plan.       Relevant Medications   Buprenorphine HCl-Naloxone HCl 8-2 MG FILM   Other Relevant Orders   ToxAssure Flex 15, Ur   Restless leg syndrome   Relevant Medications   gabapentin (NEURONTIN) 600 MG tablet      Return in about 1 week (around 01/30/2024) for REACH clinic.    Total face-to-face time with patient: 20 minutes.  Over 50% of encounter was spent on counseling and coordination of care.   Venora Maples, MD/MPH Attending Family Medicine Physician, [facility] for [facility], [facility]

## 2024-01-24 ENCOUNTER — Encounter: Payer: Self-pay | Admitting: Family Medicine

## 2024-01-24 NOTE — Assessment & Plan Note (Signed)
 Requested refill of gabapentin, sent to her pharmacy.

## 2024-01-24 NOTE — Assessment & Plan Note (Signed)
 Have not seen Meghan Bullock for sometime, I was unaware she was incarcerated. Do not have an issue going up to QID dosing for suboxone but discussed with her that I need to see her back in a week to review UDS results. She was not thrilled about this but agreed with plan.

## 2024-01-24 NOTE — Assessment & Plan Note (Signed)
 Overdue for pap since 06/2023, will address at next visit.

## 2024-01-24 NOTE — Assessment & Plan Note (Signed)
 Has been on PPI for ages. At this point definitely needs an EGD to evaluate for why her symptoms are so persistent, will address at next visit.

## 2024-01-25 LAB — TOXASSURE FLEX 15, UR
6-ACETYLMORPHINE IA: NEGATIVE ng/mL
7-aminoclonazepam: NOT DETECTED ng/mg{creat}
Alpha-hydroxyalprazolam: NOT DETECTED ng/mg{creat}
Alpha-hydroxymidazolam: NOT DETECTED ng/mg{creat}
Alpha-hydroxytriazolam: NOT DETECTED ng/mg{creat}
Alprazolam: NOT DETECTED ng/mg{creat}
BARBITURATES IA: NEGATIVE ng/mL
BUPRENORPHINE: POSITIVE
Benzodiazepines: NEGATIVE
Buprenorphine: >250 ng/mg{creat}
CANNABINOIDS IA: NEGATIVE ng/mL
Clonazepam: NOT DETECTED ng/mg{creat}
Creatinine: 400 mg/dL
Desalkylflurazepam: NOT DETECTED ng/mg{creat}
Desmethyldiazepam: NOT DETECTED ng/mg{creat}
Desmethylflunitrazepam: NOT DETECTED ng/mg{creat}
Diazepam: NOT DETECTED ng/mg{creat}
ETHYL ALCOHOL Enzymatic: POSITIVE g/dL
FENTANYL: POSITIVE
Fentanyl: >125 ng/mg{creat}
Flunitrazepam: NOT DETECTED ng/mg{creat}
Lorazepam: NOT DETECTED ng/mg{creat}
METHADONE IA: NEGATIVE ng/mL
METHADONE MTB IA: NEGATIVE ng/mL
Midazolam: NOT DETECTED ng/mg{creat}
Norbuprenorphine: >250 ng/mg{creat}
Norfentanyl: >250 ng/mg{creat}
OXYCODONE CLASS IA: NEGATIVE ng/mL
Oxazepam: NOT DETECTED ng/mg{creat}
PHENCYCLIDINE IA: NEGATIVE ng/mL
TAPENTADOL, IA: NEGATIVE ng/mL
TRAMADOL IA: NEGATIVE ng/mL
Temazepam: NOT DETECTED ng/mg{creat}

## 2024-01-25 LAB — OPIATE CLASS, MS, UR RFX
Codeine: NOT DETECTED ng/mg{creat}
Dihydrocodeine: NOT DETECTED ng/mg{creat}
Hydrocodone: NOT DETECTED ng/mg{creat}
Hydromorphone: NOT DETECTED ng/mg{creat}
Morphine: 92 ng/mg{creat}
Norcodeine: NOT DETECTED ng/mg{creat}
Norhydrocodone: NOT DETECTED ng/mg{creat}
Normorphine: NOT DETECTED ng/mg{creat}
Opiate Class Confirmation: POSITIVE

## 2024-01-25 LAB — AMPHETAMINES, MS, UR RFX
Amphetamine: NOT DETECTED ng/mg{creat}
Amphetamines Confirmation: POSITIVE
MDA (Ecstasy metabolite): NOT DETECTED ng/mg{creat}
MDMA (Ecstasy): NOT DETECTED ng/mg{creat}
Methamphetamine: 59 ng/mg{creat}

## 2024-01-25 LAB — COCAINE AND MTB, MS, UR RFX
Benzoylecgonine: >1250 ng/mg{creat}
Cocaethylene: NOT DETECTED ng/mg{creat}
Cocaine Confirmation: POSITIVE
Cocaine: 1005 ng/mg{creat}

## 2024-01-25 LAB — ALCOHOL, ETHYL, UR, QT
Alcohol, Ethyl: 0.032 g/dL
Alcohol, Ethyl: POSITIVE

## 2024-01-26 ENCOUNTER — Encounter: Payer: Self-pay | Admitting: Family Medicine

## 2024-01-30 ENCOUNTER — Ambulatory Visit: Payer: 59 | Admitting: Family Medicine

## 2024-08-22 LAB — CYTOLOGY - PAP: HPV Aptima: NEGATIVE

## 2024-08-26 ENCOUNTER — Telehealth: Payer: Self-pay | Admitting: Family Medicine

## 2024-08-26 ENCOUNTER — Other Ambulatory Visit (HOSPITAL_COMMUNITY): Payer: Self-pay

## 2024-08-26 NOTE — Telephone Encounter (Signed)
 Good afternoon Dr. Lola,   Irena stopped by our office today to inform us  that she has been incarcerated and has ran out of medication. She was hoping to see you today to get the medication refill. We informed her that we will reach out to you to update you on her situation and that you will reach out to her to discuss the medications. We updated the two best contact numbers in her chart. She mentioned that if you are going to send the prescription, that you can send it to [facility] if they are still open or send it to any local [facility].   Thanks,  [employer]

## 2024-08-27 ENCOUNTER — Encounter: Payer: Self-pay | Admitting: Oncology

## 2024-08-27 ENCOUNTER — Other Ambulatory Visit (HOSPITAL_COMMUNITY): Payer: Self-pay

## 2024-08-27 ENCOUNTER — Ambulatory Visit (INDEPENDENT_AMBULATORY_CARE_PROVIDER_SITE_OTHER): Payer: MEDICAID | Admitting: Family Medicine

## 2024-08-27 ENCOUNTER — Encounter: Payer: Self-pay | Admitting: Family Medicine

## 2024-08-27 VITALS — BP 132/83 | HR 98 | Ht 64.0 in | Wt 211.0 lb

## 2024-08-27 DIAGNOSIS — F419 Anxiety disorder, unspecified: Secondary | ICD-10-CM

## 2024-08-27 DIAGNOSIS — M545 Low back pain, unspecified: Secondary | ICD-10-CM | POA: Diagnosis not present

## 2024-08-27 DIAGNOSIS — G2581 Restless legs syndrome: Secondary | ICD-10-CM | POA: Diagnosis not present

## 2024-08-27 DIAGNOSIS — Z3046 Encounter for surveillance of implantable subdermal contraceptive: Secondary | ICD-10-CM

## 2024-08-27 DIAGNOSIS — R87612 Low grade squamous intraepithelial lesion on cytologic smear of cervix (LGSIL): Secondary | ICD-10-CM

## 2024-08-27 DIAGNOSIS — F909 Attention-deficit hyperactivity disorder, unspecified type: Secondary | ICD-10-CM | POA: Diagnosis not present

## 2024-08-27 DIAGNOSIS — F1120 Opioid dependence, uncomplicated: Secondary | ICD-10-CM

## 2024-08-27 DIAGNOSIS — K2100 Gastro-esophageal reflux disease with esophagitis, without bleeding: Secondary | ICD-10-CM | POA: Diagnosis not present

## 2024-08-27 DIAGNOSIS — D509 Iron deficiency anemia, unspecified: Secondary | ICD-10-CM

## 2024-08-27 DIAGNOSIS — Z975 Presence of (intrauterine) contraceptive device: Secondary | ICD-10-CM

## 2024-08-27 DIAGNOSIS — G8929 Other chronic pain: Secondary | ICD-10-CM

## 2024-08-27 LAB — POCT HEMOGLOBIN-HEMACUE: Hemoglobin: 8.6 g/dL — ABNORMAL LOW (ref 12.0–15.0)

## 2024-08-27 MED ORDER — BUPRENORPHINE HCL-NALOXONE HCL 8-2 MG SL FILM
1.0000 | ORAL_FILM | Freq: Three times a day (TID) | SUBLINGUAL | 0 refills | Status: DC
  Filled 2024-08-27: qty 63, 21d supply, fill #0

## 2024-08-27 MED ORDER — ETONOGESTREL 68 MG ~~LOC~~ IMPL
68.0000 mg | DRUG_IMPLANT | Freq: Once | SUBCUTANEOUS | Status: AC
  Administered 2024-08-27: 68 mg via SUBCUTANEOUS

## 2024-08-27 MED ORDER — CYCLOBENZAPRINE HCL 10 MG PO TABS
10.0000 mg | ORAL_TABLET | Freq: Three times a day (TID) | ORAL | 5 refills | Status: AC | PRN
  Filled 2024-08-27: qty 60, 20d supply, fill #0
  Filled 2024-09-16 – 2024-09-30 (×2): qty 60, 20d supply, fill #1
  Filled 2024-10-15 – 2024-11-01 (×2): qty 60, 20d supply, fill #2
  Filled 2024-11-26: qty 60, 20d supply, fill #3
  Filled 2025-01-02: qty 60, 20d supply, fill #4

## 2024-08-27 MED ORDER — GABAPENTIN 300 MG PO CAPS
300.0000 mg | ORAL_CAPSULE | Freq: Three times a day (TID) | ORAL | 5 refills | Status: DC
  Filled 2024-08-27: qty 90, 30d supply, fill #0

## 2024-08-27 MED ORDER — OMEPRAZOLE 40 MG PO CPDR
40.0000 mg | DELAYED_RELEASE_CAPSULE | Freq: Every day | ORAL | 5 refills | Status: AC
  Filled 2024-08-27: qty 30, 30d supply, fill #0
  Filled 2024-09-13 – 2024-09-30 (×3): qty 30, 30d supply, fill #1
  Filled 2024-10-15 – 2024-10-28 (×2): qty 30, 30d supply, fill #2
  Filled 2024-11-26: qty 30, 30d supply, fill #3
  Filled 2025-01-02: qty 30, 30d supply, fill #4

## 2024-08-27 NOTE — Progress Notes (Signed)
   Subjective:   Meghan Bullock is a 33 y.o. [phone] here today for ongoing substance use disorder management following release from prison 9/29.  She reports receiving buprenorphine  in prison and now needs ongoing management.  She has current housing and transportation.  Hopeful to work toward reunification with her children. Requests food bag from [employer] today.  Would also like birth control plan.    Health Maintenance Due  Topic Date Due   Pneumococcal Vaccine (1 of 2 - PCV) Never done   Hepatitis B Vaccines 19-59 Average Risk (1 of 3 - 19+ 3-dose series) Never done   HPV VACCINES (1 - 3-dose SCDM series) Never done   Cervical Cancer Screening (Pap smear)  07/14/2023   Influenza Vaccine  06/28/2024   COVID-19 Vaccine (1 - 2024-25 season) Never done    Past Medical History:  Diagnosis Date   Anemia    Anxiety    Bipolar 1 disorder (HCC)    Chlamydia    Hepatitis C    Heroin abuse (HCC)    Mononucleosis    Ovarian cyst    Polysubstance abuse (HCC)    Preterm premature rupture of membranes (PPROM) with unknown onset of labor 05/12/2014    Past Surgical History:  Procedure Laterality Date   HAND SURGERY     I & D EXTREMITY Left 09/16/2019   Procedure: IRRIGATION AND DEBRIDEMENT EXTREMITY;  Surgeon: Carolee Lynwood JINNY DOUGLAS, MD;  Location: [facility] OR;  Service: Orthopedics;  Laterality: Left;    The following portions of the patient's history were reviewed and updated as appropriate: allergies, current medications, past family history, past medical history, past social history, past surgical history and problem list.     Objective:   Meghan Bullock is well appearing with clear, linear communication.  She appears well nourished and in no distress.     Assessment and Plan:  Meghan Bullock oriented to [facility] clinic and referred to [facility], [place] for [employer].  Encouraged Meghan Bullock to communicated her MOUD and birth control needs to her OB team.  Food bag from food market given.   She has REACH consult contact as needs arise between visits. Problem List Items Addressed This Visit       Other   Opioid use disorder, severe, dependence (HCC) - Primary    Routine preventative health maintenance measures emphasized. Please refer to After Visit Summary for other counseling recommendations.   No follow-ups on file.    Total face-to-face time with patient: 20 minutes.  Over 50% of encounter was spent on counseling and coordination of care.   Delon LITTIE Frater, NNP-BC Neonatal Nurse Practitioner Substance Exposed Newborn Consult at the [facility] [phone]

## 2024-08-27 NOTE — Assessment & Plan Note (Signed)
 End of visit asked to discuss, deferred to next visit

## 2024-08-27 NOTE — Assessment & Plan Note (Signed)
 Hemocue 8.6 in clinic, will send for IV iron 

## 2024-08-27 NOTE — Progress Notes (Signed)
     GYNECOLOGY OFFICE PROCEDURE NOTE  Meghan Bullock is a 33 y.o. [phone] here for Nexplanon  removal and insertion.    Last pap smear: Result Date Procedure Results Follow-ups  07/13/2022 Cytology - PAP( [place]) High risk HPV: Positive (A) Neisseria Gonorrhea: Negative Chlamydia: Negative Trichomonas: Negative Adequacy: Satisfactory for evaluation; transformation zone component PRESENT. Diagnosis: - Low grade squamous intraepithelial lesion (LSIL) (A) Comment: There are a few cells suggestive of a higher grade lesion. Clinical Comment: correlation is recommended. Comment: Normal Reference Range Trichomonas - Negative Comment: Normal Reference Range HPV - Negative Comment: Normal Reference Ranger Chlamydia - Negative Comment: Normal Reference Range Neisseria Gonorrhea - Negative   07/09/2021 Surgical pathology( [place]/ [facility]) SURGICAL PATHOLOGY: SURGICAL PATHOLOGY CASE: [id number] PATIENT: Meghan Bullock Surgical Pathology Report     Clinical History: ASCUS with positive high risk HPV (cm)     FINAL MICROSCOPIC DIAGNOSIS:  A. CERVIX, 12 AND 6 O'CLOCK, BIOPSY: - Low-grade sq...   12/24/2020 Cytology - PAP( [place]) Chlamydia: Negative Neisseria Gonorrhea: Negative High risk HPV: Positive (A) Adequacy: Satisfactory for evaluation; transformation zone component PRESENT. Diagnosis: - Atypical squamous cells of undetermined significance (ASC-US ) (A) Comment: Normal Reference Ranger Chlamydia - Negative Comment: Normal Reference Range HPV - Negative Comment: Normal Reference Range Neisseria Gonorrhea - Negative   10/18/2017 Pap Lb, Ct-Ng, rfx HPV ASCU DIAGNOSIS:: Comment Specimen adequacy:: Comment Clinician Provided ICD10: Comment Performed by:: Comment PAP Smear Comment: . Note:: Comment PAP Reflex: Comment Chlamydia, Nuc. Acid Amp: Negative Gonococcus, Nuc. Acid Amp: Negative      Nexplanon  removal and insertion Procedure Patient  identified, informed consent performed, consent signed.   Patient does understand that irregular bleeding is a very common side effect of this medication. She was advised to have backup contraception for at least one week after replacement of the implant. Pregnancy test in clinic today was negative.  Appropriate time out taken. Nexplanon  site identified in the patient's right arm. Area prepped in usual sterile fashon. One ml of 1% lidocaine  was used to anesthetize the area at the distal end of the implant. A small stab incision was made right beside the implant on the distal portion. The Nexplanon  rod was grasped using hemostats and removed without difficulty. There was minimal blood loss. There were no complications. Area was then injected with 3 ml of 1 % lidocaine . She was re-prepped with betadine , Nexplanon  removed from packaging, Device confirmed in needle, then inserted full length of needle and withdrawn per handbook instructions. Nexplanon  was able to palpated in the patient's arm; patient palpated the insert herself.  There was minimal blood loss. Patient insertion site covered with guaze and a pressure bandage to reduce any bruising. The patient tolerated the procedure well and was given post procedure instructions.  She was advised to have backup contraception for at least one week.    [name], MD/MPH Family Medicine, [facility] for [employer], [facility]

## 2024-08-27 NOTE — Progress Notes (Signed)
 Meghan Bullock is a 33 y.o. [phone] here today for multiple issues.  Last seen on 01/23/2024 for OUD f/u  At that time had just gotten out of incarceration, was reportedly on subutex  8 TID. UDS from that visit with cocaine , THC, methamphetamines, and fentanyl   Reports she was reincarcerated for not reporting to PO Today reports she got out of jail yesterday Was told while in that she has severe anemia Currently on suboxone  24 mg Planning to start [facility] again to stay on top of her mental health Has a stable place to live nearby and stable transportation  Also thinks her nexplanon  is due to be replaced She had it placed by me on 07/09/2021  Would like to resume gabapentin  for her restless leg syndrome and chronic low back pain Has not been on it for several months  Also wondering about restarting her ADHD meds  Health Maintenance Due  Topic Date Due   Pneumococcal Vaccine (1 of 2 - PCV) Never done   Hepatitis B Vaccines 19-59 Average Risk (1 of 3 - 19+ 3-dose series) Never done   HPV VACCINES (1 - 3-dose SCDM series) Never done   Cervical Cancer Screening (Pap smear)  07/14/2023   Influenza Vaccine  06/28/2024   COVID-19 Vaccine (1 - 2024-25 season) Never done    Past Medical History:  Diagnosis Date   Anemia    Anxiety    Bipolar 1 disorder (HCC)    Chlamydia    Hepatitis C    Heroin abuse (HCC)    Mononucleosis    Ovarian cyst    Polysubstance abuse (HCC)    Preterm premature rupture of membranes (PPROM) with unknown onset of labor 05/12/2014    Past Surgical History:  Procedure Laterality Date   HAND SURGERY     I & D EXTREMITY Left 09/16/2019   Procedure: IRRIGATION AND DEBRIDEMENT EXTREMITY;  Surgeon: Carolee Lynwood JINNY DOUGLAS, MD;  Location: [facility] OR;  Service: Orthopedics;  Laterality: Left;    The following portions of the patient's history were reviewed and updated as appropriate: allergies, current medications, past family history, past medical history, past  social history, past surgical history and problem list.   Health Maintenance:   Last pap:  Result Date Procedure Results Follow-ups  07/13/2022 Cytology - PAP( [place]) High risk HPV: Positive (A) Neisseria Gonorrhea: Negative Chlamydia: Negative Trichomonas: Negative Adequacy: Satisfactory for evaluation; transformation zone component PRESENT. Diagnosis: - Low grade squamous intraepithelial lesion (LSIL) (A) Comment: There are a few cells suggestive of a higher grade lesion. Clinical Comment: correlation is recommended. Comment: Normal Reference Range Trichomonas - Negative Comment: Normal Reference Range HPV - Negative Comment: Normal Reference Ranger Chlamydia - Negative Comment: Normal Reference Range Neisseria Gonorrhea - Negative   07/09/2021 Surgical pathology( [place]/ [facility]) SURGICAL PATHOLOGY: SURGICAL PATHOLOGY CASE: [id number] PATIENT: [facility] Surgical Pathology Report     Clinical History: ASCUS with positive high risk HPV (cm)     FINAL MICROSCOPIC DIAGNOSIS:  A. CERVIX, 12 AND 6 O'CLOCK, BIOPSY: - Low-grade sq...   12/24/2020 Cytology - PAP( [place]) Chlamydia: Negative Neisseria Gonorrhea: Negative High risk HPV: Positive (A) Adequacy: Satisfactory for evaluation; transformation zone component PRESENT. Diagnosis: - Atypical squamous cells of undetermined significance (ASC-US ) (A) Comment: Normal Reference Ranger Chlamydia - Negative Comment: Normal Reference Range HPV - Negative Comment: Normal Reference Range Neisseria Gonorrhea - Negative   10/18/2017 Pap Lb, Ct-Ng, rfx HPV ASCU DIAGNOSIS:: Comment Specimen adequacy:: Comment Clinician Provided ICD10: Comment  Performed by:: Comment PAP Smear Comment: . Note:: Comment PAP Reflex: Comment Chlamydia, Nuc. Acid Amp: Negative Gonococcus, Nuc. Acid Amp: Negative      Last mammogram:  N/a    Hepatitis serologies: No results found for: HAV, HEPAIGM, HEPBIGM,  HEPBCAB, HBEAG, HEPCAB  Hep A Immunization:   Hep B Immunization:   Last LFTs: Lab Results  Component Value Date   ALT 10 04/22/2021   AST 18 04/22/2021   ALKPHOS 121 04/22/2021   BILITOT 0.7 04/22/2021     Review of Systems:  Pertinent items noted in HPI and remainder of comprehensive ROS otherwise negative.  Physical Exam:  BP 132/83   Pulse 98   Ht 5' 4 (1.626 m)   Wt 211 lb (95.7 kg)   BMI 36.22 kg/m  CONSTITUTIONAL: Well-developed, well-nourished female in no acute distress.  HEENT:  Normocephalic, atraumatic. External right and left ear normal. No scleral icterus.  NECK: Normal range of motion, supple, no masses noted on observation SKIN: No rash noted. Not diaphoretic. No erythema. No pallor. MUSCULOSKELETAL: Normal range of motion. No edema noted. NEUROLOGIC: Alert and oriented to person, place, and time. Normal muscle tone coordination.  PSYCHIATRIC: Normal mood and affect. Normal behavior. Normal judgment and thought content. RESPIRATORY: Effort normal, no problems with respiration noted  Labs and Imaging I have reviewed the PDMP during this encounter.    Last UDS: Lab Results  Component Value Date   CREATIUR 400 01/23/2024     Results for orders placed or performed in visit on 08/27/24 (from the past week)  Hemoglobin-hemacue, POC   Collection Time: 08/27/24  2:43 PM  Result Value Ref Range   Hemoglobin 8.6 (L) 12.0 - 15.0 g/dL   No results found.      Assessment and Plan:   Problem List Items Addressed This Visit       Digestive   Gastroesophageal reflux disease with esophagitis without hemorrhage   Relevant Medications   omeprazole  (PRILOSEC) 40 MG capsule     Other   ADHD   End of visit asked to discuss, deferred to next visit      Anxiety   Relevant Medications   busPIRone (BUSPAR) 30 MG tablet   gabapentin  (NEURONTIN ) 300 MG capsule   Chronic bilateral low back pain without sciatica   Relevant Medications    Buprenorphine  HCl-Naloxone  HCl 8-2 MG FILM   gabapentin  (NEURONTIN ) 300 MG capsule   cyclobenzaprine  (FLEXERIL ) 10 MG tablet   Iron  deficiency anemia   Hemocue 8.6 in clinic, will send for IV iron       Relevant Medications   cyclobenzaprine  (FLEXERIL ) 10 MG tablet   LGSIL on Pap smear of cervix   Reports normal pap in prison, f/u next visit      Nexplanon  in place   Uncomplicated removal and replacement, see procedure note.      Opioid use disorder, severe, dependence (HCC) - Primary   Reports stable on suboxone  8 TID Refill sent UDS today Rtc in 2 weeks to discuss      Relevant Medications   Buprenorphine  HCl-Naloxone  HCl 8-2 MG FILM   Other Relevant Orders   ToxAssure Flex 15, Ur   Restless leg syndrome   Refills sent for gabapentin  (agreed to lower dose after negotiation since she hasn't been on it for ages) as well as flexeril       Relevant Medications   gabapentin  (NEURONTIN ) 300 MG capsule   Other Visit Diagnoses       Encounter for removal  and reinsertion of Nexplanon        Relevant Medications   etonogestrel  (NEXPLANON ) implant 68 mg (Completed)         Return in about 2 weeks (around 09/10/2024) for REACH clinic, OUD f/u.    Total face-to-face time with patient: 30 minutes.  Over 50% of encounter was spent on counseling and coordination of care.  Future Appointments  Date Time Provider Department Center  09/09/2024  1:15 PM [facility] HEALTH CLINICIAN [facility]  09/10/2024  2:55 PM Lola Donnice HERO, MD [facility]    Donnice HERO Lola, MD/MPH Attending Family Medicine Physician, [facility] for [facility], [facility]

## 2024-08-27 NOTE — Assessment & Plan Note (Signed)
 Refills sent for gabapentin  (agreed to lower dose after negotiation since she hasn't been on it for ages) as well as flexeril 

## 2024-08-27 NOTE — Assessment & Plan Note (Signed)
 Uncomplicated removal and replacement, see procedure note.

## 2024-08-27 NOTE — Assessment & Plan Note (Signed)
 Reports stable on suboxone  8 TID Refill sent UDS today Rtc in 2 weeks to discuss

## 2024-08-27 NOTE — Assessment & Plan Note (Signed)
 Reports normal pap in prison, f/u next visit

## 2024-08-29 ENCOUNTER — Encounter: Payer: Self-pay | Admitting: Oncology

## 2024-08-29 ENCOUNTER — Other Ambulatory Visit (HOSPITAL_COMMUNITY): Payer: Self-pay

## 2024-08-29 ENCOUNTER — Other Ambulatory Visit: Payer: Self-pay

## 2024-08-29 ENCOUNTER — Encounter: Payer: Self-pay | Admitting: Family Medicine

## 2024-09-01 LAB — TOXASSURE FLEX 15, UR
6-ACETYLMORPHINE IA: NEGATIVE ng/mL
7-aminoclonazepam: NOT DETECTED ng/mg{creat}
AMPHETAMINES IA: NEGATIVE ng/mL
Alpha-hydroxyalprazolam: NOT DETECTED ng/mg{creat}
Alpha-hydroxymidazolam: NOT DETECTED ng/mg{creat}
Alpha-hydroxytriazolam: NOT DETECTED ng/mg{creat}
Alprazolam: NOT DETECTED ng/mg{creat}
BARBITURATES IA: NEGATIVE ng/mL
BUPRENORPHINE: NEGATIVE
Benzodiazepines: NEGATIVE
Buprenorphine: NOT DETECTED ng/mg{creat}
COCAINE METABOLITE IA: NEGATIVE ng/mL
Clonazepam: NOT DETECTED ng/mg{creat}
Creatinine: 34 mg/dL (ref >=20)
Desalkylflurazepam: NOT DETECTED ng/mg{creat}
Desmethyldiazepam: NOT DETECTED ng/mg{creat}
Desmethylflunitrazepam: NOT DETECTED ng/mg{creat}
Diazepam: NOT DETECTED ng/mg{creat}
ETHYL ALCOHOL Enzymatic: NEGATIVE g/dL
FENTANYL: NEGATIVE
Fentanyl: NOT DETECTED ng/mg{creat}
Flunitrazepam: NOT DETECTED ng/mg{creat}
Lorazepam: NOT DETECTED ng/mg{creat}
METHADONE IA: NEGATIVE ng/mL
METHADONE MTB IA: NEGATIVE ng/mL
Midazolam: NOT DETECTED ng/mg{creat}
Norbuprenorphine: NOT DETECTED ng/mg{creat}
Norfentanyl: NOT DETECTED ng/mg{creat}
OPIATE CLASS IA: NEGATIVE ng/mL
OXYCODONE CLASS IA: NEGATIVE ng/mL
Oxazepam: NOT DETECTED ng/mg{creat}
PHENCYCLIDINE IA: NEGATIVE ng/mL
TAPENTADOL, IA: NEGATIVE ng/mL
TRAMADOL IA: NEGATIVE ng/mL
Temazepam: NOT DETECTED ng/mg{creat}

## 2024-09-01 LAB — CANNABINOIDS, MS, UR RFX
Cannabinoids Confirmation: POSITIVE
Carboxy-THC: 56 ng/mg{creat}

## 2024-09-02 ENCOUNTER — Ambulatory Visit: Payer: Self-pay | Admitting: Family Medicine

## 2024-09-05 ENCOUNTER — Ambulatory Visit

## 2024-09-05 VITALS — BP 109/72 | HR 80 | Temp 98.7°F | Resp 18 | Ht 64.0 in | Wt 216.4 lb

## 2024-09-05 DIAGNOSIS — D509 Iron deficiency anemia, unspecified: Secondary | ICD-10-CM

## 2024-09-05 MED ORDER — IRON SUCROSE 20 MG/ML IV SOLN
200.0000 mg | Freq: Once | INTRAVENOUS | Status: AC
  Administered 2024-09-05: 200 mg via INTRAVENOUS
  Filled 2024-09-05: qty 10

## 2024-09-05 NOTE — Progress Notes (Signed)
 Diagnosis: Iron  Deficiency Anemia  Provider:  Praveen Mannam MD  Procedure: IV Push  IV Type: Peripheral, IV Location: R Wrist   Venofer  (Iron  Sucrose), Dose: 200 mg  Post Infusion IV Care: Observation period completed and Peripheral IV Discontinued  Discharge: Condition: Good, Destination: Home . AVS Declined  Performed by:  Leita FORBES Miles, LPN

## 2024-09-09 ENCOUNTER — Encounter: Payer: Self-pay | Admitting: Family Medicine

## 2024-09-09 ENCOUNTER — Ambulatory Visit

## 2024-09-09 ENCOUNTER — Ambulatory Visit: Admitting: Clinical

## 2024-09-09 ENCOUNTER — Telehealth: Payer: Self-pay | Admitting: Pharmacy Technician

## 2024-09-09 ENCOUNTER — Encounter: Payer: Self-pay | Admitting: Oncology

## 2024-09-09 MED ORDER — IRON SUCROSE 20 MG/ML IV SOLN: 200.0000 mg | Freq: Once | INTRAVENOUS | Status: DC

## 2024-09-09 NOTE — BH Specialist Note (Signed)
 Pt did arrive to phone call, happy to have moved into new place today;  after reminder about infusion appointment 15 minutes after this visit; we agreed to have her call infusion location to let them know she would be unable to make that appointment, and would call back. Second and third call to eu:ozqu HIPPA-compliant message to call back Warren from [employer] for [employer] at [facility] at  [phone] [facility] office).  ; left MyChart message for patient.

## 2024-09-09 NOTE — Telephone Encounter (Signed)
 Auth Submission: NO AUTH NEEDED Site of care: Site of care: [facility] Payer: [place] MEDICAID Medication & CPT/J Code(s) submitted: Venofer  (Iron  Sucrose) J1756 Diagnosis Code:  Route of submission (phone, fax, portal):  Phone # Fax # Auth type: Buy/Bill PB Units/visits requested: 5 DOSES Reference number:  Approval from: 08/28/24 to 11/27/24

## 2024-09-10 ENCOUNTER — Other Ambulatory Visit (HOSPITAL_COMMUNITY): Payer: Self-pay

## 2024-09-10 ENCOUNTER — Encounter: Payer: Self-pay | Admitting: Family Medicine

## 2024-09-10 ENCOUNTER — Encounter: Payer: Self-pay | Admitting: Oncology

## 2024-09-10 ENCOUNTER — Ambulatory Visit (INDEPENDENT_AMBULATORY_CARE_PROVIDER_SITE_OTHER): Admitting: Family Medicine

## 2024-09-10 VITALS — BP 124/87 | HR 130 | Wt 211.2 lb

## 2024-09-10 DIAGNOSIS — K2100 Gastro-esophageal reflux disease with esophagitis, without bleeding: Secondary | ICD-10-CM | POA: Diagnosis not present

## 2024-09-10 DIAGNOSIS — H5461 Unqualified visual loss, right eye, normal vision left eye: Secondary | ICD-10-CM

## 2024-09-10 DIAGNOSIS — Z207 Contact with and (suspected) exposure to pediculosis, acariasis and other infestations: Secondary | ICD-10-CM

## 2024-09-10 DIAGNOSIS — M545 Low back pain, unspecified: Secondary | ICD-10-CM

## 2024-09-10 DIAGNOSIS — F419 Anxiety disorder, unspecified: Secondary | ICD-10-CM

## 2024-09-10 DIAGNOSIS — F1990 Other psychoactive substance use, unspecified, uncomplicated: Secondary | ICD-10-CM

## 2024-09-10 DIAGNOSIS — G2581 Restless legs syndrome: Secondary | ICD-10-CM

## 2024-09-10 DIAGNOSIS — K047 Periapical abscess without sinus: Secondary | ICD-10-CM

## 2024-09-10 DIAGNOSIS — G8929 Other chronic pain: Secondary | ICD-10-CM

## 2024-09-10 DIAGNOSIS — R87612 Low grade squamous intraepithelial lesion on cytologic smear of cervix (LGSIL): Secondary | ICD-10-CM

## 2024-09-10 DIAGNOSIS — F1120 Opioid dependence, uncomplicated: Secondary | ICD-10-CM

## 2024-09-10 DIAGNOSIS — F909 Attention-deficit hyperactivity disorder, unspecified type: Secondary | ICD-10-CM

## 2024-09-10 MED ORDER — AMOXICILLIN 500 MG PO CAPS
500.0000 mg | ORAL_CAPSULE | Freq: Three times a day (TID) | ORAL | 0 refills | Status: AC
  Filled 2024-09-10: qty 21, 7d supply, fill #0

## 2024-09-10 MED ORDER — PERMETHRIN 5 % EX CREA
1.0000 | TOPICAL_CREAM | Freq: Once | CUTANEOUS | 0 refills | Status: DC
  Filled 2024-09-10: qty 60, 30d supply, fill #0

## 2024-09-10 MED ORDER — GABAPENTIN 300 MG PO CAPS
600.0000 mg | ORAL_CAPSULE | Freq: Three times a day (TID) | ORAL | 5 refills | Status: DC
  Filled 2024-09-10 – 2024-09-11 (×2): qty 180, 30d supply, fill #0
  Filled 2024-10-07: qty 180, 30d supply, fill #1
  Filled 2024-11-04: qty 180, 30d supply, fill #2

## 2024-09-10 NOTE — Progress Notes (Signed)
 Meghan Bullock is a 33 y.o. [phone] here today for f/u of multiple issues.  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 Would like to see an eye doctor  Reports she has autotitrated back up to gabapentin  600 TID Would like to go up to this dose which is what she was originally on Restless legs are much better  Discussed results of UDS Shivaun understandably very upset as she is adamant she has been taking it  Reports she has taken some of her friends adderall Would like to discuss again   Health Maintenance Due  Topic Date Due   Pneumococcal Vaccine (1 of 2 - PCV) Never done   Hepatitis B Vaccines 19-59 Average Risk (1 of 3 - 19+ 3-dose series) Never done   HPV VACCINES (1 - 3-dose SCDM series) Never done   Cervical Cancer Screening (Pap smear)  07/14/2023   Influenza Vaccine  06/28/2024   COVID-19 Vaccine (1 - 2025-26 season) Never done    Past Medical History:  Diagnosis Date   Anemia    Anxiety    Bipolar 1 disorder (HCC)    Chlamydia    Hepatitis C    Heroin abuse (HCC)    Mononucleosis    Ovarian cyst    Polysubstance abuse (HCC)    Preterm premature rupture of membranes (PPROM) with unknown onset of labor 05/12/2014    Past Surgical History:  Procedure Laterality Date   HAND SURGERY     I & D EXTREMITY Left 09/16/2019   Procedure: IRRIGATION AND DEBRIDEMENT EXTREMITY;  Surgeon: Carolee Lynwood JINNY DOUGLAS, MD;  Location: [facility] OR;  Service: Orthopedics;  Laterality: Left;    The following portions of the  patient's history were reviewed and updated as appropriate: allergies, current medications, past family history, past medical history, past social history, past surgical history and problem list.   Health Maintenance:   Last pap:  Result Date Procedure Results Follow-ups  07/13/2022 Cytology - PAP( [place]) High risk HPV: Positive (A) Neisseria Gonorrhea: Negative Chlamydia: Negative Trichomonas: Negative Adequacy: Satisfactory for evaluation; transformation zone component PRESENT. Diagnosis: - Low grade squamous intraepithelial lesion (LSIL) (A) Comment: There are a few cells suggestive of a higher grade lesion. Clinical Comment: correlation is recommended. Comment: Normal Reference Range Trichomonas - Negative Comment: Normal Reference Range HPV - Negative Comment: Normal Reference Ranger Chlamydia - Negative Comment: Normal Reference Range Neisseria Gonorrhea - Negative   07/09/2021 Surgical pathology( [place]/ [facility]) SURGICAL PATHOLOGY: SURGICAL PATHOLOGY CASE: [id number] PATIENT: [facility] Surgical Pathology Report     Clinical History: ASCUS with positive high risk HPV (cm)     FINAL MICROSCOPIC DIAGNOSIS:  A. CERVIX, 12 AND 6 O'CLOCK, BIOPSY: - Low-grade sq...   12/24/2020 Cytology - PAP( [place]) Chlamydia: Negative Neisseria Gonorrhea: Negative High risk HPV: Positive (A) Adequacy: Satisfactory for evaluation; transformation zone component PRESENT. Diagnosis: - Atypical squamous cells of undetermined significance (ASC-US ) (A) Comment: Normal Reference Ranger Chlamydia - Negative Comment: Normal Reference Range  HPV - Negative Comment: Normal Reference Range Neisseria Gonorrhea - Negative   10/18/2017 Pap Lb, Ct-Ng, rfx HPV ASCU DIAGNOSIS:: Comment Specimen adequacy:: Comment Clinician Provided ICD10: Comment Performed by:: Comment PAP Smear Comment: . Note:: Comment PAP Reflex: Comment Chlamydia, Nuc. Acid Amp: Negative Gonococcus,  Nuc. Acid Amp: Negative      Last mammogram:  N/a    Hepatitis serologies: No results found for: HAV, HEPAIGM, HEPBIGM, HEPBCAB, HBEAG, HEPCAB   Last LFTs: Lab Results  Component Value Date   ALT 10 04/22/2021   AST 18 04/22/2021   ALKPHOS 121 04/22/2021   BILITOT 0.7 04/22/2021     Review of Systems:  Pertinent items noted in HPI and remainder of comprehensive ROS otherwise negative.  Physical Exam:  BP 124/87   Pulse (!) 130   Wt 211 lb 3.2 oz (95.8 kg)   BMI 36.25 kg/m  CONSTITUTIONAL: Well-developed, well-nourished female in no acute distress.  HEENT:  Normocephalic, atraumatic. External right and left ear normal. No scleral icterus.  NECK: Normal range of motion, supple, no masses noted on observation SKIN: No rash noted. Not diaphoretic. No erythema. No pallor. MUSCULOSKELETAL: Normal range of motion. No edema noted. NEUROLOGIC: Alert and oriented to person, place, and time. Normal muscle tone coordination.  PSYCHIATRIC: Normal mood and affect. Normal behavior. Normal judgment and thought content. RESPIRATORY: Effort normal, no problems with respiration noted  Labs and Imaging PDMP not reviewed this encounter.    Last UDS: Lab Results  Component Value Date   CREATIUR 34 08/27/2024     No results found for this or any previous visit (from the past week). No results found.      Assessment and Plan:   Problem List Items Addressed This Visit       Digestive   Dental infection   Very poor dentition, rx sent for amoxicillin , instructed to call medicaid to see who can see her and schedule appt ASAP       Relevant Medications   amoxicillin  (AMOXIL ) 500 MG capsule   Gastroesophageal reflux disease with esophagitis without hemorrhage   Having epigastric pain and has been on PPI long term, definitely needs an EGD and has been referred for it in the past, new referral placed.       Relevant Orders   Ambulatory referral to Gastroenterology      Other   ADHD   Had planned to discuss resuming meds but unable to because of discordant UDS results. Naryiah was very upset about this, repeatedly explained that I can't start a new controlled substance until the situation is clarified.       Anxiety   Relevant Medications   gabapentin  (NEURONTIN ) 300 MG capsule   Chronic bilateral low back pain without sciatica   Relevant Medications   gabapentin  (NEURONTIN ) 300 MG capsule   Exposure to head lice - Primary   Rx sent for permethrin, discussed adjunctive strategies      LGSIL on Pap smear of cervix   ROI sent for prison pap      Opioid use disorder, severe, dependence (HCC)   Discussed that UDS dose not show any buprenorphine  and that his is not consistent with what she told me. Tametha got incredibly upset with me and the situation in general. Discussed that I am not accusing her of anything, but that I can't give a refill or discuss starting other controlled substances until this is clarified. I said we can do blood and urine and she agreed to both,  though she left without leaving a urine but I was able to get serum with an US  guided blood draw myself for bup levels.       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    Total face-to-face time with patient: 45 minutes.  Over 50% of encounter was spent on counseling and coordination of care.  Future Appointments  Date Time Provider Department Center  09/16/2024  1:30 PM [facility] None  09/18/2024  1:45 PM [facility] None  09/20/2024  2:15 PM [facility] None  09/24/2024  1:35 PM Liyla Radliff, Donnice HERO, MD [facility]    Donnice HERO Carolus, MD/MPH Attending  Family Medicine Physician, [facility] for [facility], [facility]

## 2024-09-10 NOTE — Progress Notes (Signed)
   Subjective:   Meghan Bullock is a 33 y.o. [phone] here today for ongoing OUD management, postpartum care.  She reports feeling well and obtaining independent housing yesterday with her son. She has concerns for lice exposure and requests evaluation and possible treatment.  Health Maintenance Due  Topic Date Due   Pneumococcal Vaccine (1 of 2 - PCV) Never done   Hepatitis B Vaccines 19-59 Average Risk (1 of 3 - 19+ 3-dose series) Never done   HPV VACCINES (1 - 3-dose SCDM series) Never done   Cervical Cancer Screening (Pap smear)  07/14/2023   Influenza Vaccine  06/28/2024   COVID-19 Vaccine (1 - 2025-26 season) Never done    Past Medical History:  Diagnosis Date   Anemia    Anxiety    Bipolar 1 disorder (HCC)    Chlamydia    Hepatitis C    Heroin abuse (HCC)    Mononucleosis    Ovarian cyst    Polysubstance abuse (HCC)    Preterm premature rupture of membranes (PPROM) with unknown onset of labor 05/12/2014    Past Surgical History:  Procedure Laterality Date   HAND SURGERY     I & D EXTREMITY Left 09/16/2019   Procedure: IRRIGATION AND DEBRIDEMENT EXTREMITY;  Surgeon: Carolee Lynwood JINNY DOUGLAS, MD;  Location: [facility] OR;  Service: Orthopedics;  Laterality: Left;    The following portions of the patient's history were reviewed and updated as appropriate: allergies, current medications, past family history, past medical history, past social history, past surgical history and problem list.     Objective:  Camie is well appearing and in no distress.  She has clear communication with appropriate questions.   Assessment and Plan:  Camie has no immediate needs from the substance exposed newborn team.  [place] items given and she has REACH consult contact information if needs arise prior to next visit. Problem List Items Addressed This Visit   None   Routine preventative health maintenance measures emphasized. Please refer to After Visit Summary for other counseling  recommendations.   No follow-ups on file.    Total face-to-face time with patient:  minutes.  Over 50% of encounter was spent on counseling and coordination of care.   Delon LITTIE Frater, NNP-BC Neonatal Nurse Practitioner Substance Exposed Newborn Consult at the [facility] [phone]

## 2024-09-11 ENCOUNTER — Other Ambulatory Visit: Payer: Self-pay

## 2024-09-11 ENCOUNTER — Ambulatory Visit

## 2024-09-11 ENCOUNTER — Other Ambulatory Visit (HOSPITAL_COMMUNITY): Payer: Self-pay

## 2024-09-11 VITALS — BP 119/80 | HR 107 | Temp 97.9°F | Resp 20 | Ht 64.0 in | Wt 213.8 lb

## 2024-09-11 DIAGNOSIS — D509 Iron deficiency anemia, unspecified: Secondary | ICD-10-CM

## 2024-09-11 MED ORDER — IRON SUCROSE 20 MG/ML IV SOLN
200.0000 mg | Freq: Once | INTRAVENOUS | Status: AC
  Administered 2024-09-11: 200 mg via INTRAVENOUS
  Filled 2024-09-11: qty 10

## 2024-09-11 NOTE — Progress Notes (Signed)
 Diagnosis: Iron  Deficiency Anemia  Provider:  Praveen Mannam MD  Procedure: IV Push  IV Type: Peripheral, IV Location: L Forearm  Venofer  (Iron  Sucrose), Dose: 200 mg  Post Infusion IV Care: Observation period completed  Discharge: Condition: Good, Destination: Home . AVS Declined  Performed by:  Leita FORBES Miles, LPN

## 2024-09-12 ENCOUNTER — Other Ambulatory Visit (HOSPITAL_COMMUNITY): Payer: Self-pay

## 2024-09-13 ENCOUNTER — Other Ambulatory Visit: Payer: Self-pay | Admitting: Family Medicine

## 2024-09-13 ENCOUNTER — Other Ambulatory Visit (HOSPITAL_COMMUNITY): Payer: Self-pay

## 2024-09-13 DIAGNOSIS — K047 Periapical abscess without sinus: Secondary | ICD-10-CM

## 2024-09-13 DIAGNOSIS — H5461 Unqualified visual loss, right eye, normal vision left eye: Secondary | ICD-10-CM | POA: Insufficient documentation

## 2024-09-13 DIAGNOSIS — F1120 Opioid dependence, uncomplicated: Secondary | ICD-10-CM

## 2024-09-13 DIAGNOSIS — Z207 Contact with and (suspected) exposure to pediculosis, acariasis and other infestations: Secondary | ICD-10-CM | POA: Insufficient documentation

## 2024-09-13 NOTE — Assessment & Plan Note (Signed)
 Rx sent for permethrin, discussed adjunctive strategies

## 2024-09-13 NOTE — Assessment & Plan Note (Signed)
 Very poor dentition, rx sent for amoxicillin , instructed to call medicaid to see who can see her and schedule appt ASAP

## 2024-09-13 NOTE — Assessment & Plan Note (Signed)
 Discussed that UDS dose not show any buprenorphine  and that his is not consistent with what she told me. Merleen got incredibly upset with me and the situation in general. Discussed that I am not accusing her of anything, but that I can't give a refill or discuss starting other controlled substances until this is clarified. I said we can do blood and urine and she agreed to both, though she left without leaving a urine but I was able to get serum with an US  guided blood draw myself for bup levels.

## 2024-09-13 NOTE — Assessment & Plan Note (Signed)
 ROI sent for prison pap

## 2024-09-13 NOTE — Assessment & Plan Note (Signed)
 Having epigastric pain and has been on PPI long term, definitely needs an EGD and has been referred for it in the past, new referral placed.

## 2024-09-13 NOTE — Assessment & Plan Note (Signed)
 Had planned to discuss resuming meds but unable to because of discordant UDS results. Meghan Bullock was very upset about this, repeatedly explained that I can't start a new controlled substance until the situation is clarified.

## 2024-09-16 ENCOUNTER — Other Ambulatory Visit (HOSPITAL_COMMUNITY): Payer: Self-pay

## 2024-09-16 ENCOUNTER — Encounter: Payer: Self-pay | Admitting: Family Medicine

## 2024-09-16 ENCOUNTER — Ambulatory Visit (INDEPENDENT_AMBULATORY_CARE_PROVIDER_SITE_OTHER)

## 2024-09-16 ENCOUNTER — Other Ambulatory Visit: Payer: Self-pay

## 2024-09-16 VITALS — BP 106/72 | HR 74 | Temp 98.4°F | Resp 16 | Ht 64.0 in | Wt 218.8 lb

## 2024-09-16 DIAGNOSIS — D509 Iron deficiency anemia, unspecified: Secondary | ICD-10-CM

## 2024-09-16 LAB — BUPRENORPHINE AND METABOLITE
Buprenorphine: 3.17 ng/mL (ref 1.00–10.00)
Norbuprenorphine: 3.97 ng/mL

## 2024-09-16 MED ORDER — IRON SUCROSE 20 MG/ML IV SOLN
200.0000 mg | Freq: Once | INTRAVENOUS | Status: AC
  Administered 2024-09-16: 200 mg via INTRAVENOUS
  Filled 2024-09-16: qty 10

## 2024-09-16 NOTE — Progress Notes (Signed)
 Diagnosis: Iron Deficiency Anemia  Provider:  Chilton Greathouse MD  Procedure: IV Push  IV Type: Peripheral, IV Location: R Forearm  Venofer (Iron Sucrose), Dose: 200 mg  Post Infusion IV Care: Observation period completed and Peripheral IV Discontinued. 15 minute observation per patient request.  Discharge: Condition: Stable, Destination: Home . AVS Declined  Performed by:  Wyvonne Lenz, RN

## 2024-09-17 ENCOUNTER — Other Ambulatory Visit: Payer: Self-pay

## 2024-09-17 ENCOUNTER — Telehealth: Payer: Self-pay | Admitting: Family Medicine

## 2024-09-17 ENCOUNTER — Ambulatory Visit: Payer: Self-pay | Admitting: Family Medicine

## 2024-09-17 ENCOUNTER — Other Ambulatory Visit (HOSPITAL_COMMUNITY): Payer: Self-pay

## 2024-09-17 DIAGNOSIS — F1990 Other psychoactive substance use, unspecified, uncomplicated: Secondary | ICD-10-CM

## 2024-09-17 DIAGNOSIS — F1120 Opioid dependence, uncomplicated: Secondary | ICD-10-CM

## 2024-09-17 MED ORDER — BUPRENORPHINE HCL-NALOXONE HCL 8-2 MG SL FILM
1.0000 | ORAL_FILM | Freq: Three times a day (TID) | SUBLINGUAL | 0 refills | Status: AC
  Filled 2024-09-17: qty 90, 30d supply, fill #0

## 2024-09-17 NOTE — Telephone Encounter (Signed)
 Patient called to say she needs her Rx refilled for her suboxone .

## 2024-09-18 ENCOUNTER — Other Ambulatory Visit: Payer: Self-pay | Admitting: Family Medicine

## 2024-09-18 ENCOUNTER — Other Ambulatory Visit (HOSPITAL_COMMUNITY): Payer: Self-pay

## 2024-09-18 ENCOUNTER — Ambulatory Visit

## 2024-09-18 DIAGNOSIS — K047 Periapical abscess without sinus: Secondary | ICD-10-CM

## 2024-09-18 MED ORDER — IRON SUCROSE 20 MG/ML IV SOLN: 200.0000 mg | Freq: Once | INTRAVENOUS | Status: DC

## 2024-09-19 ENCOUNTER — Encounter: Payer: Self-pay | Admitting: Oncology

## 2024-09-19 ENCOUNTER — Encounter: Payer: Self-pay | Admitting: Family Medicine

## 2024-09-19 ENCOUNTER — Other Ambulatory Visit (HOSPITAL_COMMUNITY): Payer: Self-pay

## 2024-09-20 ENCOUNTER — Telehealth: Payer: Self-pay

## 2024-09-20 ENCOUNTER — Ambulatory Visit

## 2024-09-20 NOTE — Telephone Encounter (Signed)
 Called to see if pt wanted to reschedule 2 missed appointments. No answer. Left a voicemail.

## 2024-09-22 LAB — TOXASSURE FLEX 15, UR
6-ACETYLMORPHINE IA: NEGATIVE ng/mL
7-aminoclonazepam: NOT DETECTED ng/mg{creat}
AMPHETAMINES IA: NEGATIVE ng/mL
Alpha-hydroxyalprazolam: NOT DETECTED ng/mg{creat}
Alpha-hydroxymidazolam: NOT DETECTED ng/mg{creat}
Alpha-hydroxytriazolam: NOT DETECTED ng/mg{creat}
Alprazolam: NOT DETECTED ng/mg{creat}
BARBITURATES IA: NEGATIVE ng/mL
BUPRENORPHINE: POSITIVE
Benzodiazepines: NEGATIVE
Buprenorphine: 172 ng/mg{creat}
CANNABINOIDS IA: NEGATIVE ng/mL
COCAINE METABOLITE IA: NEGATIVE ng/mL
Clonazepam: NOT DETECTED ng/mg{creat}
Creatinine: 93 mg/dL (ref >=20)
Desalkylflurazepam: NOT DETECTED ng/mg{creat}
Desmethyldiazepam: NOT DETECTED ng/mg{creat}
Desmethylflunitrazepam: NOT DETECTED ng/mg{creat}
Diazepam: NOT DETECTED ng/mg{creat}
ETHYL ALCOHOL Enzymatic: NEGATIVE g/dL
FENTANYL: NEGATIVE
Fentanyl: NOT DETECTED ng/mg{creat}
Flunitrazepam: NOT DETECTED ng/mg{creat}
Lorazepam: NOT DETECTED ng/mg{creat}
METHADONE IA: NEGATIVE ng/mL
METHADONE MTB IA: NEGATIVE ng/mL
Midazolam: NOT DETECTED ng/mg{creat}
Norbuprenorphine: 211 ng/mg{creat}
Norfentanyl: NOT DETECTED ng/mg{creat}
OPIATE CLASS IA: NEGATIVE ng/mL
OXYCODONE CLASS IA: NEGATIVE ng/mL
Oxazepam: NOT DETECTED ng/mg{creat}
PHENCYCLIDINE IA: NEGATIVE ng/mL
TAPENTADOL, IA: NEGATIVE ng/mL
TRAMADOL IA: NEGATIVE ng/mL
Temazepam: NOT DETECTED ng/mg{creat}

## 2024-09-23 ENCOUNTER — Encounter: Payer: Self-pay | Admitting: Oncology

## 2024-09-23 ENCOUNTER — Encounter: Payer: Self-pay | Admitting: Family Medicine

## 2024-09-24 ENCOUNTER — Ambulatory Visit (INDEPENDENT_AMBULATORY_CARE_PROVIDER_SITE_OTHER): Admitting: Family Medicine

## 2024-09-24 ENCOUNTER — Encounter: Payer: Self-pay | Admitting: Oncology

## 2024-09-24 ENCOUNTER — Encounter: Payer: Self-pay | Admitting: Family Medicine

## 2024-09-24 ENCOUNTER — Other Ambulatory Visit (HOSPITAL_COMMUNITY): Payer: Self-pay

## 2024-09-24 ENCOUNTER — Other Ambulatory Visit: Payer: Self-pay

## 2024-09-24 VITALS — BP 125/86 | HR 121 | Wt 208.2 lb

## 2024-09-24 DIAGNOSIS — F909 Attention-deficit hyperactivity disorder, unspecified type: Secondary | ICD-10-CM | POA: Diagnosis not present

## 2024-09-24 DIAGNOSIS — Z975 Presence of (intrauterine) contraceptive device: Secondary | ICD-10-CM

## 2024-09-24 MED ORDER — METHYLPHENIDATE HCL ER (CD) 20 MG PO CPCR
20.0000 mg | ORAL_CAPSULE | ORAL | 0 refills | Status: DC
  Filled 2024-09-24 – 2024-09-30 (×2): qty 21, 21d supply, fill #0

## 2024-09-24 NOTE — Progress Notes (Signed)
 Meghan Bullock is a 33 y.o. [phone] here today for follow up.  Last seen 09/10/2024 Very wide ranging visit, see note for details Today following up specifically regarding ADHD At last visit we discussed I could not start a new controlled substance as there was discrepancy with UDS Subsequently she provided both serum and UDS that showed adherence to therapy  Today reports she was on adderall from age [age] Tried vyvanse but didn't work as well Very interested in starting back  Thinking about taking nexplanon  out Has niece and son present, doesn't want to discuss further in front of them  Health Maintenance Due  Topic Date Due   Pneumococcal Vaccine (1 of 2 - PCV) Never done   Hepatitis B Vaccines 19-59 Average Risk (1 of 3 - 19+ 3-dose series) Never done   HPV VACCINES (1 - 3-dose SCDM series) Never done   Cervical Cancer Screening (Pap smear)  07/14/2023   Influenza Vaccine  06/28/2024   COVID-19 Vaccine (1 - 2025-26 season) Never done    Past Medical History:  Diagnosis Date   Anemia    Anxiety    Bipolar 1 disorder (HCC)    Chlamydia    Hepatitis C    Heroin abuse (HCC)    Mononucleosis    Ovarian cyst    Polysubstance abuse (HCC)    Preterm premature rupture of membranes (PPROM) with unknown onset of labor 05/12/2014    Past Surgical History:  Procedure Laterality Date   HAND SURGERY     I & D EXTREMITY Left 09/16/2019   Procedure: IRRIGATION AND DEBRIDEMENT EXTREMITY;  Surgeon: Carolee Lynwood JINNY DOUGLAS, MD;  Location: [facility];  Service: Orthopedics;  Laterality: Left;    The following portions of the patient's history were reviewed and updated as appropriate: allergies, current medications, past family history, past medical history, past social history, past surgical history and problem list.   Health Maintenance:   Last pap:  Result Date Procedure Results Follow-ups  07/13/2022 Cytology - PAP( [place]) High risk HPV: Positive (A) Neisseria  Gonorrhea: Negative Chlamydia: Negative Trichomonas: Negative Adequacy: Satisfactory for evaluation; transformation zone component PRESENT. Diagnosis: - Low grade squamous intraepithelial lesion (LSIL) (A) Comment: There are a few cells suggestive of a higher grade lesion. Clinical Comment: correlation is recommended. Comment: Normal Reference Range Trichomonas - Negative Comment: Normal Reference Range HPV - Negative Comment: Normal Reference Ranger Chlamydia - Negative Comment: Normal Reference Range Neisseria Gonorrhea - Negative   07/09/2021 Surgical pathology( [place]/ [facility]) SURGICAL PATHOLOGY: SURGICAL PATHOLOGY CASE: [id number] PATIENT: [facility] Surgical Pathology Report     Clinical History: ASCUS with positive high risk HPV (cm)     FINAL MICROSCOPIC DIAGNOSIS:  A. CERVIX, 12 AND 6 O'CLOCK, BIOPSY: - Low-grade sq...   12/24/2020 Cytology - PAP( [place]) Chlamydia: Negative Neisseria Gonorrhea: Negative High risk HPV: Positive (A) Adequacy: Satisfactory for evaluation; transformation zone component PRESENT. Diagnosis: - Atypical squamous cells of undetermined significance (ASC-US ) (A) Comment: Normal Reference Ranger Chlamydia - Negative Comment: Normal Reference Range HPV - Negative Comment: Normal Reference Range Neisseria Gonorrhea - Negative   10/18/2017 Pap Lb, Ct-Ng, rfx HPV ASCU DIAGNOSIS:: Comment Specimen adequacy:: Comment Clinician Provided ICD10: Comment Performed by:: Comment PAP Smear Comment: . Note:: Comment PAP Reflex: Comment Chlamydia, Nuc. Acid Amp: Negative Gonococcus, Nuc. Acid Amp: Negative     Last mammogram:  N/a   Hepatitis serologies: No results found for: HAV, HEPAIGM, HEPBIGM, HEPBCAB, HBEAG, HEPCAB  Hep A Immunization:  Hep B Immunization:   Last LFTs: Lab Results  Component Value Date   ALT 10 04/22/2021   AST 18 04/22/2021   ALKPHOS 121 04/22/2021   BILITOT 0.7 04/22/2021      Review of Systems:  Pertinent items noted in HPI and remainder of comprehensive ROS otherwise negative.  Physical Exam:  BP 125/86   Pulse (!) 121   Wt 208 lb 3.2 oz (94.4 kg)   BMI 35.74 kg/m  CONSTITUTIONAL: Well-developed, well-nourished female in no acute distress.  HEENT:  Normocephalic, atraumatic. External right and left ear normal. No scleral icterus.  NECK: Normal range of motion, supple, no masses noted on observation SKIN: No rash noted. Not diaphoretic. No erythema. No pallor. MUSCULOSKELETAL: Normal range of motion. No edema noted. NEUROLOGIC: Alert and oriented to person, place, and time. Normal muscle tone coordination.  PSYCHIATRIC: Normal mood and affect. Normal behavior. Normal judgment and thought content. RESPIRATORY: Effort normal, no problems with respiration noted  Labs and Imaging PDMP not reviewed this encounter.    Last UDS: Lab Results  Component Value Date   CREATIUR 93 09/17/2024    Results for orders placed or performed in visit on 09/10/24 (from the past week)  ToxAssure Flex 15, Ur   Collection Time: 09/17/24  5:11 PM  Result Value Ref Range   Summary Report FINAL    Creatinine 93 >=20 mg/dL   AMPHETAMINES IA Negative CUTOFF:300 ng/mL   Benzodiazepines Negative    Diazepam Not Detected ng/mg creat   Desmethyldiazepam Not Detected ng/mg creat   Oxazepam Not Detected ng/mg creat   Temazepam Not Detected ng/mg creat   Alprazolam Not Detected ng/mg creat   Alpha-hydroxyalprazolam Not Detected ng/mg creat   Desalkylflurazepam Not Detected ng/mg creat   Lorazepam  Not Detected ng/mg creat   Alpha-hydroxytriazolam Not Detected ng/mg creat   Clonazepam Not Detected ng/mg creat   7-aminoclonazepam Not Detected ng/mg creat   Midazolam  Not Detected ng/mg creat   Alpha-hydroxymidazolam Not Detected ng/mg creat   Flunitrazepam Not Detected ng/mg creat   Desmethylflunitrazepam Not Detected ng/mg creat   COCAINE  METABOLITE IA Negative  CUTOFF:150 ng/mL   ETHYL ALCOHOL  Enzymatic Negative CUTOFF:0.020 g/dL   CANNABINOIDS IA Negative CUTOFF:20 ng/mL   6-ACETYLMORPHINE IA Negative CUTOFF:10 ng/mL   OPIATE CLASS IA Negative CUTOFF:100 ng/mL   OXYCODONE  CLASS IA Negative CUTOFF:100 ng/mL   METHADONE  IA Negative CUTOFF:100 ng/mL   METHADONE  MTB IA Negative CUTOFF:100 ng/mL   BUPRENORPHINE  +POSITIVE+    Buprenorphine  172 ng/mg creat   Norbuprenorphine 211 ng/mg creat   FENTANYL  Negative    Fentanyl  Not Detected ng/mg creat   Norfentanyl Not Detected ng/mg creat   TAPENTADOL, IA Negative CUTOFF:200 ng/mL   TRAMADOL  IA Negative CUTOFF:200 ng/mL   BARBITURATES IA Negative CUTOFF:200 ng/mL   PHENCYCLIDINE IA Negative CUTOFF:25 ng/mL   No results found.       Assessment and Plan:   Problem List Items Addressed This Visit       Other   ADHD - Primary   Controlled substance testing has been appropriate of late. 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 She requested resuming Adderall but I reviewed this is very difficult for me to monitor and I would much prefer at least trying methylphenidate first. She was a bit disappointed but understood rationale and agreed to this plan. We  will touch base in two weeks virtually, if ritalin is not effective we will give Adderall a try.       Relevant Medications   methylphenidate (METADATE CD) 20 MG CR capsule   Nexplanon  in place   Only recently removed/replaced. Did not want to discuss details, f/u next in person visit.          Return in about 2 weeks (around 10/08/2024) for REACH clinic.    Total face-to-face time with patient: 20 minutes.  Over 50% of encounter was spent on counseling and coordination of care.  No future appointments.  [name], MD/MPH Attending Family Medicine Physician, [facility] for [facility], [facility]

## 2024-09-24 NOTE — Assessment & Plan Note (Signed)
 Controlled substance testing has been appropriate of late. 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 She requested resuming Adderall but I reviewed this is very difficult for me to monitor and I would much prefer at least trying methylphenidate first. She was a bit disappointed but understood rationale and agreed to this plan. We will touch base in two weeks virtually, if ritalin is not effective we will give Adderall a try.

## 2024-09-24 NOTE — Assessment & Plan Note (Signed)
 Only recently removed/replaced. Did not want to discuss details, f/u next in person visit.

## 2024-09-27 ENCOUNTER — Other Ambulatory Visit (HOSPITAL_COMMUNITY): Payer: Self-pay

## 2024-09-30 ENCOUNTER — Other Ambulatory Visit (HOSPITAL_COMMUNITY): Payer: Self-pay

## 2024-10-06 ENCOUNTER — Encounter: Payer: Self-pay | Admitting: Family Medicine

## 2024-10-08 ENCOUNTER — Telehealth (INDEPENDENT_AMBULATORY_CARE_PROVIDER_SITE_OTHER): Payer: Self-pay | Admitting: Family Medicine

## 2024-10-08 ENCOUNTER — Other Ambulatory Visit (HOSPITAL_COMMUNITY): Payer: Self-pay

## 2024-10-08 ENCOUNTER — Encounter: Payer: Self-pay | Admitting: Family Medicine

## 2024-10-08 DIAGNOSIS — F901 Attention-deficit hyperactivity disorder, predominantly hyperactive type: Secondary | ICD-10-CM | POA: Diagnosis not present

## 2024-10-08 MED ORDER — AMPHETAMINE-DEXTROAMPHET ER 20 MG PO CP24
20.0000 mg | ORAL_CAPSULE | Freq: Every day | ORAL | 0 refills | Status: DC
  Filled 2024-10-08: qty 30, 30d supply, fill #0

## 2024-10-08 NOTE — Progress Notes (Signed)
   TELEHEALTH REACH VISIT ENCOUNTER NOTE  Provider location: Center for [employer] [facility] [employer] [facility]   Patient location: Meghan Bullock (unsure of specific location)  I connected with Meghan Bullock on 10/08/24 at  4:15 PM EST by telephone and verified that I am speaking with the correct person using two identifiers. Patient was unable to do MyChart audiovisual encounter due to technical difficulties, she tried several times.    I discussed the limitations, risks, security and privacy concerns of performing an evaluation and management service by telephone and the availability of in person appointments. I also discussed with the patient that there may be a patient responsible charge related to this service. The patient expressed understanding and agreed to proceed.   History:  Meghan Bullock is a 33 y.o. [phone] female being evaluated today for ADHD follow up.   At last visit I asked her to please trial ritalin She reports it did not help with her focus, made her feel groggy Would again like to trial Adderall     Past Medical History:  Diagnosis Date   Anemia    Anxiety    Bipolar 1 disorder (HCC)    Chlamydia    Hepatitis C    Heroin abuse (HCC)    Mononucleosis    Ovarian cyst    Polysubstance abuse (HCC)    Preterm premature rupture of membranes (PPROM) with unknown onset of labor 05/12/2014   Past Surgical History:  Procedure Laterality Date   HAND SURGERY     I & D EXTREMITY Left 09/16/2019   Procedure: IRRIGATION AND DEBRIDEMENT EXTREMITY;  Surgeon: Carolee Lynwood JINNY DOUGLAS, MD;  Location: [facility] OR;  Service: Orthopedics;  Laterality: Left;   The following portions of the patient's history were reviewed and updated as appropriate: allergies, current medications, past family history, past medical history, past social history, past surgical history and problem list.    Review of Systems:  Pertinent items noted in HPI and remainder of comprehensive ROS  otherwise negative.  Physical Exam:   General:  Alert, oriented and cooperative.   Mental Status: Normal mood and affect perceived. Normal judgment and thought content.  Physical exam deferred due to nature of the encounter  Labs and Imaging No results found for this or any previous visit (from the past 2 weeks). No results found.         Assessment and Plan:     1. Attention deficit hyperactivity disorder (ADHD), predominantly hyperactive type (Primary) No improvement with Ritalin Trial Adderall, reports she was on 30 mg XR years ago Discussed we will start at 20 mg and see her back in a few weeks to see how its going and collect UDS - amphetamine-dextroamphetamine (ADDERALL XR) 20 MG 24 hr capsule; Take 1 capsule (20 mg total) by mouth daily.  Dispense: 30 capsule; Refill: 0       I discussed the assessment and treatment plan with the patient. The patient was provided an opportunity to ask questions and all were answered. The patient agreed with the plan and demonstrated an understanding of the instructions.   The patient was advised to call back or seek an in-person evaluation/go to the ED if the symptoms worsen or if the condition fails to improve as anticipated.  I provided 15 minutes of non-face-to-face time during this encounter.   Meghan CHRISTELLA Carolus, MD Center for [facility], [facility]

## 2024-10-10 ENCOUNTER — Ambulatory Visit

## 2024-10-10 MED ORDER — IRON SUCROSE 20 MG/ML IV SOLN: 200.0000 mg | Freq: Once | INTRAVENOUS | Status: AC

## 2024-10-14 ENCOUNTER — Encounter: Payer: Self-pay | Admitting: *Deleted

## 2024-10-15 ENCOUNTER — Other Ambulatory Visit: Payer: Self-pay

## 2024-10-15 ENCOUNTER — Other Ambulatory Visit (HOSPITAL_COMMUNITY): Payer: Self-pay

## 2024-10-15 ENCOUNTER — Other Ambulatory Visit: Payer: Self-pay | Admitting: Family Medicine

## 2024-10-15 DIAGNOSIS — F1120 Opioid dependence, uncomplicated: Secondary | ICD-10-CM

## 2024-10-18 ENCOUNTER — Other Ambulatory Visit (HOSPITAL_COMMUNITY): Payer: Self-pay

## 2024-10-18 ENCOUNTER — Other Ambulatory Visit: Payer: Self-pay

## 2024-10-18 ENCOUNTER — Other Ambulatory Visit: Payer: Self-pay | Admitting: Family Medicine

## 2024-10-18 DIAGNOSIS — F1120 Opioid dependence, uncomplicated: Secondary | ICD-10-CM

## 2024-10-18 MED ORDER — BUPRENORPHINE HCL-NALOXONE HCL 8-2 MG SL FILM
1.0000 | ORAL_FILM | Freq: Three times a day (TID) | SUBLINGUAL | 0 refills | Status: DC
  Filled 2024-10-18: qty 90, 30d supply, fill #0

## 2024-10-18 NOTE — Progress Notes (Signed)
 Patient came to clinic reporting she needs suboxone  refill Leaving UDS Refill sent

## 2024-10-25 ENCOUNTER — Other Ambulatory Visit (HOSPITAL_COMMUNITY): Payer: Self-pay

## 2024-10-29 ENCOUNTER — Encounter: Payer: Self-pay | Admitting: Family Medicine

## 2024-10-29 ENCOUNTER — Encounter: Payer: Self-pay | Admitting: Oncology

## 2024-11-01 ENCOUNTER — Other Ambulatory Visit (HOSPITAL_COMMUNITY): Payer: Self-pay

## 2024-11-04 ENCOUNTER — Other Ambulatory Visit (HOSPITAL_COMMUNITY): Payer: Self-pay

## 2024-11-04 ENCOUNTER — Ambulatory Visit

## 2024-11-05 ENCOUNTER — Encounter: Payer: Self-pay | Admitting: Advanced Practice Midwife

## 2024-11-05 ENCOUNTER — Ambulatory Visit (INDEPENDENT_AMBULATORY_CARE_PROVIDER_SITE_OTHER): Payer: Self-pay | Admitting: Advanced Practice Midwife

## 2024-11-05 ENCOUNTER — Other Ambulatory Visit: Payer: Self-pay

## 2024-11-05 ENCOUNTER — Other Ambulatory Visit (HOSPITAL_COMMUNITY): Payer: Self-pay

## 2024-11-05 VITALS — BP 118/76 | HR 110

## 2024-11-05 DIAGNOSIS — G8929 Other chronic pain: Secondary | ICD-10-CM

## 2024-11-05 DIAGNOSIS — M545 Low back pain, unspecified: Secondary | ICD-10-CM

## 2024-11-05 DIAGNOSIS — G2581 Restless legs syndrome: Secondary | ICD-10-CM | POA: Diagnosis not present

## 2024-11-05 DIAGNOSIS — Z5181 Encounter for therapeutic drug level monitoring: Secondary | ICD-10-CM

## 2024-11-05 DIAGNOSIS — R87612 Low grade squamous intraepithelial lesion on cytologic smear of cervix (LGSIL): Secondary | ICD-10-CM | POA: Diagnosis not present

## 2024-11-05 DIAGNOSIS — F419 Anxiety disorder, unspecified: Secondary | ICD-10-CM

## 2024-11-05 DIAGNOSIS — Z3169 Encounter for other general counseling and advice on procreation: Secondary | ICD-10-CM

## 2024-11-05 DIAGNOSIS — Z79891 Long term (current) use of opiate analgesic: Secondary | ICD-10-CM

## 2024-11-05 DIAGNOSIS — Z207 Contact with and (suspected) exposure to pediculosis, acariasis and other infestations: Secondary | ICD-10-CM

## 2024-11-05 DIAGNOSIS — F901 Attention-deficit hyperactivity disorder, predominantly hyperactive type: Secondary | ICD-10-CM

## 2024-11-05 DIAGNOSIS — F1120 Opioid dependence, uncomplicated: Secondary | ICD-10-CM

## 2024-11-05 DIAGNOSIS — Z975 Presence of (intrauterine) contraceptive device: Secondary | ICD-10-CM

## 2024-11-05 MED ORDER — AMPHETAMINE-DEXTROAMPHET ER 30 MG PO CP24
30.0000 mg | ORAL_CAPSULE | Freq: Every day | ORAL | 0 refills | Status: DC
  Filled 2024-11-05: qty 30, 30d supply, fill #0

## 2024-11-05 MED ORDER — PRENATAL 27-1 MG PO TABS
1.0000 | ORAL_TABLET | Freq: Every day | ORAL | 12 refills | Status: AC
  Filled 2024-11-05: qty 30, 30d supply, fill #0
  Filled 2025-01-02: qty 30, 30d supply, fill #1

## 2024-11-05 MED ORDER — BUPRENORPHINE HCL-NALOXONE HCL 8-2 MG SL FILM
1.0000 | ORAL_FILM | Freq: Four times a day (QID) | SUBLINGUAL | 0 refills | Status: DC
  Filled 2024-11-05: qty 120, 30d supply, fill #0

## 2024-11-05 MED ORDER — AMPHETAMINE-DEXTROAMPHET ER 30 MG PO CP24: 30.0000 mg | ORAL_CAPSULE | Freq: Every day | ORAL | 0 refills | Status: DC

## 2024-11-05 MED ORDER — GABAPENTIN 300 MG PO CAPS
600.0000 mg | ORAL_CAPSULE | Freq: Three times a day (TID) | ORAL | 5 refills | Status: DC
  Filled 2024-11-05 – 2024-12-02 (×3): qty 180, 30d supply, fill #0

## 2024-11-05 MED ORDER — PERMETHRIN 5 % EX CREA
1.0000 | TOPICAL_CREAM | Freq: Once | CUTANEOUS | 1 refills | Status: DC | PRN
  Filled 2024-11-05: qty 60, 30d supply, fill #0

## 2024-11-05 NOTE — Progress Notes (Unsigned)
 Meghan Bullock is a 33 y.o. [phone] here today for: Suboxone  management  She and her family are still experiencing head lice.  ADHD medication management Chronic low back pain Considering Nexplanon  removal so that she can conceive  Has one more iron  infusion for IDA   Health Maintenance Due  Topic Date Due   Pneumococcal Vaccine (1 of 2 - PCV) Never done   Hepatitis B Vaccines 19-59 Average Risk (1 of 3 - 19+ 3-dose series) Never done   HPV VACCINES (1 - 3-dose SCDM series) Never done   Influenza Vaccine  06/28/2024   COVID-19 Vaccine (1 - 2025-26 season) Never done    Past Medical History:  Diagnosis Date   Anemia    Anxiety    Bipolar 1 disorder (HCC)    Chlamydia    Hepatitis C    Heroin abuse (HCC)    Mononucleosis    Ovarian cyst    Polysubstance abuse (HCC)    Preterm premature rupture of membranes (PPROM) with unknown onset of labor 05/12/2014    Past Surgical History:  Procedure Laterality Date   HAND SURGERY     I & D EXTREMITY Left 09/16/2019   Procedure: IRRIGATION AND DEBRIDEMENT EXTREMITY;  Surgeon: Carolee Lynwood JINNY DOUGLAS, MD;  Location: [facility] OR;  Service: Orthopedics;  Laterality: Left;    The following portions of the patient's history were reviewed and updated as appropriate: allergies, current medications, past family history, past medical history, past social history, past surgical history and problem list.   Health Maintenance:   Last pap:  Result Date Procedure Results Follow-ups  08/22/2024 Cytology - PAP [facility] Pap smear: nilm HPV Aptima: negative HPV testing in 5 years: due 08/22/2029 Pap in 5 years: due 08/22/2029  07/13/2022 Cytology - PAP( [place]) High risk HPV: Positive (A) Neisseria Gonorrhea: Negative Chlamydia: Negative Trichomonas: Negative Adequacy: Satisfactory for evaluation; transformation zone component PRESENT. Diagnosis: - Low grade squamous intraepithelial lesion (LSIL) (A) Comment: There are a few cells suggestive of a  higher grade lesion. Clinical Comment: correlation is recommended. Comment: Normal Reference Range Trichomonas - Negative Comment: Normal Reference Range HPV - Negative Comment: Normal Reference Ranger Chlamydia - Negative Comment: Normal Reference Range Neisseria Gonorrhea - Negative   07/09/2021 Surgical pathology( [place]/ [facility]) SURGICAL PATHOLOGY: SURGICAL PATHOLOGY CASE: [id number] PATIENT: [facility] Surgical Pathology Report     Clinical History: ASCUS with positive high risk HPV (cm)     FINAL MICROSCOPIC DIAGNOSIS:  A. CERVIX, 12 AND 6 O'CLOCK, BIOPSY: - Low-grade sq...   12/24/2020 Cytology - PAP( [place]) Chlamydia: Negative Neisseria Gonorrhea: Negative High risk HPV: Positive (A) Adequacy: Satisfactory for evaluation; transformation zone component PRESENT. Diagnosis: - Atypical squamous cells of undetermined significance (ASC-US ) (A) Comment: Normal Reference Ranger Chlamydia - Negative Comment: Normal Reference Range HPV - Negative Comment: Normal Reference Range Neisseria Gonorrhea - Negative   10/18/2017 Pap Lb, Ct-Ng, rfx HPV ASCU DIAGNOSIS:: Comment Specimen adequacy:: Comment Clinician Provided ICD10: Comment Performed by:: Comment PAP Smear Comment: . Note:: Comment PAP Reflex: Comment Chlamydia, Nuc. Acid Amp: Negative Gonococcus, Nuc. Acid Amp: Negative     Last LFTs: Lab Results  Component Value Date   ALT 10 04/22/2021   AST 18 04/22/2021   ALKPHOS 121 04/22/2021   BILITOT 0.7 04/22/2021     Review of Systems:  Pertinent items noted in HPI and remainder of comprehensive ROS otherwise negative.  Physical Exam:  BP 118/76   Pulse (!) 110  CONSTITUTIONAL: Well-developed,  well-nourished female in no acute distress.  HEENT:  Normocephalic, atraumatic. External right and left ear normal. No scleral icterus.  SKIN: No rash noted. Not diaphoretic. No erythema. No pallor. MUSCULOSKELETAL: Normal range of motion. No edema  noted. NEUROLOGIC: Alert and oriented to person, place, and time. Normal muscle tone coordination.  PSYCHIATRIC: Normal mood and affect. Normal behavior. Normal judgment and thought content. RESPIRATORY: Effort normal, no problems with respiration noted ABDOMEN: Deferred PELVIC: Deferred  Labs and Imaging I have reviewed the PDMP during this encounter.   Last UDS: Lab Results  Component Value Date   CREATIUR 93 09/17/2024     Assessment and Plan:  1. Exposure to head lice - permethrin  (ELIMITE ) 5 % cream; Apply 1 Application topically once as needed for up to 1 dose.  Dispense: 60 g; Refill: 1  2. Attention deficit hyperactivity disorder (ADHD), predominantly hyperactive type - amphetamine -dextroamphetamine  (ADDERALL XR) 30 MG 24 hr capsule; Take 1 capsule (30 mg total) by mouth daily.  Dispense: 30 capsule; Refill: 0  3. 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 Nexplanon  in place  5. Opioid use disorder, severe, dependence (HCC) - ToxAssure Flex 15, Ur - Buprenorphine  HCl-Naloxone  HCl (SUBOXONE ) 8-2 MG FILM; Place 1 Film under the tongue in the morning, at noon, in the evening, and at bedtime.  Dispense: 120 Film; Refill: 0  6. Encounter for monitoring Suboxone  maintenance therapy - ToxAssure Flex 15, Ur - Buprenorphine  HCl-Naloxone  HCl (SUBOXONE ) 8-2 MG FILM; Place 1 Film under the tongue in the morning, at noon, in the evening, and at bedtime.  Dispense: 120 Film; Refill: 0  7. Encounter for preconception consultation  8. Anxiety  9. Restless leg syndrome  10. Chronic bilateral low back pain without sciatica   No follow-ups on file.   Future Appointments  Date Time Provider Department Center  11/07/2024  1:45 PM [facility]CHAIR 1 [facility] None    Total face-to-face time with patient: 30 minutes.  Over 50% of encounter was spent on counseling and coordination of care.   Jazzlyn Huizenga  Claudene HOWARD 11/05/2024 3:02 PM Center for [place], [facility]

## 2024-11-05 NOTE — Progress Notes (Signed)
 Brief postpartum check in with Meghan Bullock.  She reports feeling well with no new issues.  She requested and visited the food market prior to her appointment.  She has the REACH contact clinic information for needs that arise prior to her next visit.

## 2024-11-07 ENCOUNTER — Ambulatory Visit

## 2024-11-10 LAB — AMPHETAMINES, MS, UR RFX
Amphetamine: 570 ng/mg{creat}
Amphetamines Confirmation: POSITIVE
MDA (Ecstasy metabolite): NOT DETECTED ng/mg{creat}
MDMA (Ecstasy): NOT DETECTED ng/mg{creat}
Methamphetamine: NOT DETECTED ng/mg{creat}

## 2024-11-10 LAB — CANNABINOIDS, MS, UR RFX
Cannabinoids Confirmation: POSITIVE
Carboxy-THC: 186 ng/mg{creat}

## 2024-11-10 LAB — TOXASSURE FLEX 15, UR
6-ACETYLMORPHINE IA: NEGATIVE ng/mL
7-aminoclonazepam: NOT DETECTED ng/mg{creat}
Alpha-hydroxyalprazolam: NOT DETECTED ng/mg{creat}
Alpha-hydroxymidazolam: NOT DETECTED ng/mg{creat}
Alpha-hydroxytriazolam: NOT DETECTED ng/mg{creat}
Alprazolam: NOT DETECTED ng/mg{creat}
BARBITURATES IA: NEGATIVE ng/mL
BUPRENORPHINE: POSITIVE
Benzodiazepines: POSITIVE
Buprenorphine: 179 ng/mg{creat}
COCAINE METABOLITE IA: NEGATIVE ng/mL
Clonazepam: NOT DETECTED ng/mg{creat}
Creatinine: 168 mg/dL (ref >=20)
Desalkylflurazepam: NOT DETECTED ng/mg{creat}
Desmethyldiazepam: NOT DETECTED ng/mg{creat}
Desmethylflunitrazepam: NOT DETECTED ng/mg{creat}
Diazepam: NOT DETECTED ng/mg{creat}
ETHYL ALCOHOL Enzymatic: NEGATIVE g/dL
FENTANYL: NEGATIVE
Fentanyl: NOT DETECTED ng/mg{creat}
Flunitrazepam: NOT DETECTED ng/mg{creat}
Lorazepam: NOT DETECTED ng/mg{creat}
METHADONE IA: NEGATIVE ng/mL
METHADONE MTB IA: NEGATIVE ng/mL
Midazolam: NOT DETECTED ng/mg{creat}
Norbuprenorphine: 440 ng/mg{creat}
Norfentanyl: NOT DETECTED ng/mg{creat}
OPIATE CLASS IA: NEGATIVE ng/mL
OXYCODONE CLASS IA: NEGATIVE ng/mL
Oxazepam: 27 ng/mg{creat}
PHENCYCLIDINE IA: NEGATIVE ng/mL
TAPENTADOL, IA: NEGATIVE ng/mL
TRAMADOL IA: NEGATIVE ng/mL
Temazepam: NOT DETECTED ng/mg{creat}

## 2024-11-12 ENCOUNTER — Encounter: Payer: Self-pay | Admitting: Family Medicine

## 2024-11-12 ENCOUNTER — Ambulatory Visit: Admitting: Family Medicine

## 2024-11-12 VITALS — BP 121/82 | HR 93 | Wt 216.6 lb

## 2024-11-12 DIAGNOSIS — Z3046 Encounter for surveillance of implantable subdermal contraceptive: Secondary | ICD-10-CM | POA: Diagnosis not present

## 2024-11-12 DIAGNOSIS — F1310 Sedative, hypnotic or anxiolytic abuse, uncomplicated: Secondary | ICD-10-CM | POA: Diagnosis not present

## 2024-11-12 DIAGNOSIS — Z975 Presence of (intrauterine) contraceptive device: Secondary | ICD-10-CM

## 2024-11-12 NOTE — Assessment & Plan Note (Signed)
 Removed without issue at patient request, see procedure note.

## 2024-11-12 NOTE — Progress Notes (Signed)
° ° ° °  GYNECOLOGY OFFICE PROCEDURE NOTE  Meghan Bullock is a 33 y.o. [phone] here for Nexplanon  removal.    Last pap smear: Result Date Procedure Results Follow-ups  08/22/2024 Cytology - PAP [facility] Pap smear: nilm HPV Aptima: negative HPV testing in 5 years: due 08/22/2029 Pap in 5 years: due 08/22/2029  07/13/2022 Cytology - PAP( [place]) High risk HPV: Positive (A) Neisseria Gonorrhea: Negative Chlamydia: Negative Trichomonas: Negative Adequacy: Satisfactory for evaluation; transformation zone component PRESENT. Diagnosis: - Low grade squamous intraepithelial lesion (LSIL) (A) Comment: There are a few cells suggestive of a higher grade lesion. Clinical Comment: correlation is recommended. Comment: Normal Reference Range Trichomonas - Negative Comment: Normal Reference Range HPV - Negative Comment: Normal Reference Ranger Chlamydia - Negative Comment: Normal Reference Range Neisseria Gonorrhea - Negative   07/09/2021 Surgical pathology( [place]/ [facility]) SURGICAL PATHOLOGY: SURGICAL PATHOLOGY CASE: [id number] PATIENT: [facility] Surgical Pathology Report     Clinical History: ASCUS with positive high risk HPV (cm)     FINAL MICROSCOPIC DIAGNOSIS:  A. CERVIX, 12 AND 6 O'CLOCK, BIOPSY: - Low-grade sq...   12/24/2020 Cytology - PAP( [place]) Chlamydia: Negative Neisseria Gonorrhea: Negative High risk HPV: Positive (A) Adequacy: Satisfactory for evaluation; transformation zone component PRESENT. Diagnosis: - Atypical squamous cells of undetermined significance (ASC-US ) (A) Comment: Normal Reference Ranger Chlamydia - Negative Comment: Normal Reference Range HPV - Negative Comment: Normal Reference Range Neisseria Gonorrhea - Negative   10/18/2017 Pap Lb, Ct-Ng, rfx HPV ASCU DIAGNOSIS:: Comment Specimen adequacy:: Comment Clinician Provided ICD10: Comment Performed by:: Comment PAP Smear Comment: . Note:: Comment PAP Reflex: Comment Chlamydia,  Nuc. Acid Amp: Negative Gonococcus, Nuc. Acid Amp: Negative      Nexplanon  removal Procedure Patient identified, informed consent performed, consent signed.   Appropriate time out taken. Nexplanon  site identified in patient's right arm.  Area prepped in usual sterile fashon. One ml of 1% lidocaine  was used to anesthetize the area at the distal end of the implant. A small stab incision was made right beside the implant on the distal portion.  The Nexplanon  rod was grasped using hemostats and removed without difficulty. There was minimal blood loss. There were no complications. Steri-strips were applied over the small incision. A pressure bandage was applied to reduce any bruising. The patient tolerated the procedure well and was given post procedure instructions. Patient declines other forms of birth control.  [name], MD/MPH Family Medicine, [facility] for [employer], [facility]

## 2024-11-12 NOTE — Assessment & Plan Note (Signed)
+  benzo on mass spec testing, patient theory that it is due to buspar is not plausible outside of antibody screening scenario, ctm.

## 2024-11-12 NOTE — Progress Notes (Signed)
 Meghan Bullock is a 33 y.o. [phone] here today for nexplanon  removal.  Reports she is doing well Would like nexplanon  out as she is thinking about getting pregnant  Discussed UDS from last visit Appropriate amphetamine  and bup, however benzo metabolite was present Reports she is taking buspar occasionally for anxiety and thinks this may have been the cause  Health Maintenance Due  Topic Date Due   Pneumococcal Vaccine (1 of 2 - PCV) Never done   Hepatitis B Vaccines 19-59 Average Risk (1 of 3 - 19+ 3-dose series) Never done   HPV VACCINES (1 - 3-dose SCDM series) Never done   Influenza Vaccine  06/28/2024   COVID-19 Vaccine (1 - 2025-26 season) Never done    Past Medical History:  Diagnosis Date   Anemia    Anxiety    Bipolar 1 disorder (HCC)    Chlamydia    Hepatitis C    Heroin abuse (HCC)    Mononucleosis    Ovarian cyst    Polysubstance abuse (HCC)    Preterm premature rupture of membranes (PPROM) with unknown onset of labor 05/12/2014    Past Surgical History:  Procedure Laterality Date   HAND SURGERY     I & D EXTREMITY Left 09/16/2019   Procedure: IRRIGATION AND DEBRIDEMENT EXTREMITY;  Surgeon: Carolee Lynwood JINNY DOUGLAS, MD;  Location: [facility] OR;  Service: Orthopedics;  Laterality: Left;    The following portions of the patient's history were reviewed and updated as appropriate: allergies, current medications, past family history, past medical history, past social history, past surgical history and problem list.   Health Maintenance:   Last pap:  Result Date Procedure Results Follow-ups  08/22/2024 Cytology - PAP [facility] Pap smear: nilm HPV Aptima: negative HPV testing in 5 years: due 08/22/2029 Pap in 5 years: due 08/22/2029  07/13/2022 Cytology - PAP( [place]) High risk HPV: Positive (A) Neisseria Gonorrhea: Negative Chlamydia: Negative Trichomonas: Negative Adequacy: Satisfactory for evaluation; transformation zone component PRESENT. Diagnosis: - Low grade  squamous intraepithelial lesion (LSIL) (A) Comment: There are a few cells suggestive of a higher grade lesion. Clinical Comment: correlation is recommended. Comment: Normal Reference Range Trichomonas - Negative Comment: Normal Reference Range HPV - Negative Comment: Normal Reference Ranger Chlamydia - Negative Comment: Normal Reference Range Neisseria Gonorrhea - Negative   07/09/2021 Surgical pathology( [place]/ [facility]) SURGICAL PATHOLOGY: SURGICAL PATHOLOGY CASE: [id number] PATIENT: [facility] Surgical Pathology Report     Clinical History: ASCUS with positive high risk HPV (cm)     FINAL MICROSCOPIC DIAGNOSIS:  A. CERVIX, 12 AND 6 O'CLOCK, BIOPSY: - Low-grade sq...   12/24/2020 Cytology - PAP( [place]) Chlamydia: Negative Neisseria Gonorrhea: Negative High risk HPV: Positive (A) Adequacy: Satisfactory for evaluation; transformation zone component PRESENT. Diagnosis: - Atypical squamous cells of undetermined significance (ASC-US ) (A) Comment: Normal Reference Ranger Chlamydia - Negative Comment: Normal Reference Range HPV - Negative Comment: Normal Reference Range Neisseria Gonorrhea - Negative   10/18/2017 Pap Lb, Ct-Ng, rfx HPV ASCU DIAGNOSIS:: Comment Specimen adequacy:: Comment Clinician Provided ICD10: Comment Performed by:: Comment PAP Smear Comment: . Note:: Comment PAP Reflex: Comment Chlamydia, Nuc. Acid Amp: Negative Gonococcus, Nuc. Acid Amp: Negative     Last mammogram:  N/a    Last LFTs: Lab Results  Component Value Date   ALT 10 04/22/2021   AST 18 04/22/2021   ALKPHOS 121 04/22/2021   BILITOT 0.7 04/22/2021     Review of Systems:  Pertinent items noted in HPI and  remainder of comprehensive ROS otherwise negative.  Physical Exam:  BP 121/82   Pulse 93   Wt 216 lb 9.6 oz (98.2 kg)   BMI 37.18 kg/m  CONSTITUTIONAL: Well-developed, well-nourished female in no acute distress.  HEENT:  Normocephalic, atraumatic.  External right and left ear normal. No scleral icterus.  NECK: Normal range of motion, supple, no masses noted on observation SKIN: No rash noted. Not diaphoretic. No erythema. No pallor. MUSCULOSKELETAL: Normal range of motion. No edema noted. NEUROLOGIC: Alert and oriented to person, place, and time. Normal muscle tone coordination.  PSYCHIATRIC: Normal mood and affect. Normal behavior. Normal judgment and thought content. RESPIRATORY: Effort normal, no problems with respiration noted  Labs and Imaging I have reviewed the PDMP during this encounter.    Last UDS: Lab Results  Component Value Date   CREATIUR 168 11/05/2024    Results for orders placed or performed in visit on 11/05/24 (from the past week)  ToxAssure Flex 15, Ur   Collection Time: 11/05/24  4:48 PM  Result Value Ref Range   Summary Report FINAL    Creatinine 168 >=20 mg/dL   AMPHETAMINES IA Comment CUTOFF:300 ng/mL   Benzodiazepines +POSITIVE+    Diazepam Not Detected ng/mg creat   Desmethyldiazepam Not Detected ng/mg creat   Oxazepam 27 ng/mg creat   Temazepam Not Detected ng/mg creat   Alprazolam Not Detected ng/mg creat   Alpha-hydroxyalprazolam Not Detected ng/mg creat   Desalkylflurazepam Not Detected ng/mg creat   Lorazepam  Not Detected ng/mg creat   Alpha-hydroxytriazolam Not Detected ng/mg creat   Clonazepam Not Detected ng/mg creat   7-aminoclonazepam Not Detected ng/mg creat   Midazolam  Not Detected ng/mg creat   Alpha-hydroxymidazolam Not Detected ng/mg creat   Flunitrazepam Not Detected ng/mg creat   Desmethylflunitrazepam Not Detected ng/mg creat   COCAINE  METABOLITE IA Negative CUTOFF:150 ng/mL   ETHYL ALCOHOL  Enzymatic Negative CUTOFF:0.020 g/dL   CANNABINOIDS IA Comment CUTOFF:20 ng/mL   6-ACETYLMORPHINE IA Negative CUTOFF:10 ng/mL   OPIATE CLASS IA Negative CUTOFF:100 ng/mL   OXYCODONE  CLASS IA Negative CUTOFF:100 ng/mL   METHADONE  IA Negative CUTOFF:100 ng/mL   METHADONE  MTB IA  Negative CUTOFF:100 ng/mL   BUPRENORPHINE  +POSITIVE+    Buprenorphine  179 ng/mg creat   Norbuprenorphine 440 ng/mg creat   FENTANYL  Negative    Fentanyl  Not Detected ng/mg creat   Norfentanyl Not Detected ng/mg creat   TAPENTADOL, IA Negative CUTOFF:200 ng/mL   TRAMADOL  IA Negative CUTOFF:200 ng/mL   BARBITURATES IA Negative CUTOFF:200 ng/mL   PHENCYCLIDINE IA Negative CUTOFF:25 ng/mL  Amphetamines, MS, Ur RFX   Collection Time: 11/05/24  4:48 PM  Result Value Ref Range   Amphetamines Confirmation +POSITIVE+    Methamphetamine Not Detected ng/mg creat   Amphetamine  570 ng/mg creat   MDMA (Ecstasy) Not Detected ng/mg creat   MDA (Ecstasy metabolite) Not Detected ng/mg creat  Cannabinoids, MS, Ur RFX   Collection Time: 11/05/24  4:48 PM  Result Value Ref Range   Cannabinoids Confirmation +POSITIVE+    Carboxy-THC 186 ng/mg creat   No results found.      Assessment and Plan:   Problem List Items Addressed This Visit       Other   Benzodiazepine abuse (HCC)   +benzo on mass spec testing, patient theory that it is due to buspar is not plausible outside of antibody screening scenario, ctm.       Nexplanon  in place - Primary   Removed without issue at patient request, see procedure note.  Return in about 4 weeks (around 12/10/2024) for REACH clinic, OUD f/u.    Total face-to-face time with patient: 15 minutes.  Over 50% of encounter was spent on counseling and coordination of care.  Future Appointments  Date Time Provider Department Center  11/22/2024  8:00 AM Georgina Dace [facility] None  12/10/2024  1:35 PM Lola Donnice HERO, MD [facility]  [facility]    Donnice HERO Lola, MD/MPH Attending Family Medicine Physician, [facility] for [facility], [facility]

## 2024-11-18 ENCOUNTER — Encounter: Payer: Self-pay | Admitting: General Practice

## 2024-11-19 ENCOUNTER — Encounter: Payer: Self-pay | Admitting: Family Medicine

## 2024-11-19 ENCOUNTER — Encounter: Payer: Self-pay | Admitting: Oncology

## 2024-11-22 ENCOUNTER — Ambulatory Visit (HOSPITAL_COMMUNITY)

## 2024-11-22 ENCOUNTER — Telehealth (HOSPITAL_COMMUNITY): Payer: Self-pay

## 2024-11-22 ENCOUNTER — Encounter (HOSPITAL_COMMUNITY): Payer: Self-pay

## 2024-11-22 NOTE — Telephone Encounter (Signed)
 Sent text at 8:02am and 8:08am.

## 2024-11-26 ENCOUNTER — Other Ambulatory Visit (HOSPITAL_COMMUNITY): Payer: Self-pay

## 2024-11-26 ENCOUNTER — Other Ambulatory Visit: Payer: Self-pay

## 2024-12-02 ENCOUNTER — Other Ambulatory Visit (HOSPITAL_COMMUNITY): Payer: Self-pay

## 2024-12-02 ENCOUNTER — Encounter: Payer: Self-pay | Admitting: Family Medicine

## 2024-12-02 DIAGNOSIS — F901 Attention-deficit hyperactivity disorder, predominantly hyperactive type: Secondary | ICD-10-CM

## 2024-12-02 DIAGNOSIS — Z79891 Long term (current) use of opiate analgesic: Secondary | ICD-10-CM

## 2024-12-02 DIAGNOSIS — F1120 Opioid dependence, uncomplicated: Secondary | ICD-10-CM

## 2024-12-03 ENCOUNTER — Encounter: Payer: Self-pay | Admitting: Oncology

## 2024-12-03 ENCOUNTER — Other Ambulatory Visit

## 2024-12-03 ENCOUNTER — Other Ambulatory Visit (HOSPITAL_COMMUNITY): Payer: Self-pay

## 2024-12-03 ENCOUNTER — Encounter: Payer: Self-pay | Admitting: Family Medicine

## 2024-12-03 DIAGNOSIS — F1990 Other psychoactive substance use, unspecified, uncomplicated: Secondary | ICD-10-CM

## 2024-12-03 DIAGNOSIS — Z79891 Long term (current) use of opiate analgesic: Secondary | ICD-10-CM

## 2024-12-03 DIAGNOSIS — F901 Attention-deficit hyperactivity disorder, predominantly hyperactive type: Secondary | ICD-10-CM

## 2024-12-03 DIAGNOSIS — M545 Low back pain, unspecified: Secondary | ICD-10-CM

## 2024-12-03 DIAGNOSIS — F909 Attention-deficit hyperactivity disorder, unspecified type: Secondary | ICD-10-CM

## 2024-12-03 DIAGNOSIS — F1120 Opioid dependence, uncomplicated: Secondary | ICD-10-CM

## 2024-12-03 DIAGNOSIS — F419 Anxiety disorder, unspecified: Secondary | ICD-10-CM

## 2024-12-03 DIAGNOSIS — G2581 Restless legs syndrome: Secondary | ICD-10-CM

## 2024-12-03 MED ORDER — BUPRENORPHINE HCL-NALOXONE HCL 8-2 MG SL FILM
1.0000 | ORAL_FILM | Freq: Four times a day (QID) | SUBLINGUAL | 0 refills | Status: AC
  Filled 2024-12-03: qty 120, 30d supply, fill #0

## 2024-12-03 MED ORDER — GABAPENTIN 300 MG PO CAPS
600.0000 mg | ORAL_CAPSULE | Freq: Three times a day (TID) | ORAL | 5 refills | Status: AC
  Filled 2024-12-03 – 2025-01-02 (×2): qty 180, 30d supply, fill #0

## 2024-12-03 MED ORDER — AMPHETAMINE-DEXTROAMPHET ER 10 MG PO CP24
10.0000 mg | ORAL_CAPSULE | Freq: Every day | ORAL | 0 refills | Status: DC
  Filled 2024-12-03: qty 30, 30d supply, fill #0

## 2024-12-03 MED ORDER — AMPHETAMINE-DEXTROAMPHET ER 30 MG PO CP24
30.0000 mg | ORAL_CAPSULE | Freq: Every day | ORAL | 0 refills | Status: DC
  Filled 2024-12-03: qty 30, 30d supply, fill #0

## 2024-12-03 MED ORDER — AMPHETAMINE-DEXTROAMPHET ER 20 MG PO CP24
20.0000 mg | ORAL_CAPSULE | Freq: Every day | ORAL | 0 refills | Status: DC
  Filled 2024-12-03: qty 30, 30d supply, fill #0

## 2024-12-03 NOTE — Progress Notes (Signed)
 Refills placed after UDS was left

## 2024-12-03 NOTE — Addendum Note (Signed)
 Addended by: Quentyn Kolbeck, MATEO on: 12/03/2024 02:39 PM   Modules accepted: Orders

## 2024-12-03 NOTE — Addendum Note (Signed)
 Addended by: Hildred Mollica, MATEO on: 12/03/2024 02:54 PM   Modules accepted: Orders

## 2024-12-05 LAB — TOXASSURE FLEX 15, UR
6-ACETYLMORPHINE IA: NEGATIVE ng/mL
7-aminoclonazepam: NOT DETECTED ng/mg{creat}
Alpha-hydroxyalprazolam: NOT DETECTED ng/mg{creat}
Alpha-hydroxymidazolam: NOT DETECTED ng/mg{creat}
Alpha-hydroxytriazolam: NOT DETECTED ng/mg{creat}
Alprazolam: NOT DETECTED ng/mg{creat}
BARBITURATES IA: NEGATIVE ng/mL
BUPRENORPHINE: POSITIVE
Benzodiazepines: POSITIVE
Buprenorphine: 292 ng/mg{creat}
COCAINE METABOLITE IA: NEGATIVE ng/mL
Clonazepam: NOT DETECTED ng/mg{creat}
Creatinine: 216 mg/dL
Desalkylflurazepam: NOT DETECTED ng/mg{creat}
Desmethyldiazepam: NOT DETECTED ng/mg{creat}
Desmethylflunitrazepam: NOT DETECTED ng/mg{creat}
Diazepam: NOT DETECTED ng/mg{creat}
ETHYL ALCOHOL Enzymatic: NEGATIVE g/dL
FENTANYL: NEGATIVE
Fentanyl: NOT DETECTED ng/mg{creat}
Flunitrazepam: NOT DETECTED ng/mg{creat}
Lorazepam: NOT DETECTED ng/mg{creat}
METHADONE IA: NEGATIVE ng/mL
METHADONE MTB IA: NEGATIVE ng/mL
Midazolam: NOT DETECTED ng/mg{creat}
Norbuprenorphine: 271 ng/mg{creat}
Norfentanyl: NOT DETECTED ng/mg{creat}
OPIATE CLASS IA: NEGATIVE ng/mL
OXYCODONE CLASS IA: NEGATIVE ng/mL
Oxazepam: 44 ng/mg{creat}
PHENCYCLIDINE IA: NEGATIVE ng/mL
TAPENTADOL, IA: NEGATIVE ng/mL
TRAMADOL IA: NEGATIVE ng/mL
Temazepam: 13 ng/mg{creat}

## 2024-12-05 LAB — AMPHETAMINES, MS, UR RFX
Amphetamine: 967 ng/mg{creat}
Amphetamines Confirmation: POSITIVE
MDA (Ecstasy metabolite): NOT DETECTED ng/mg{creat}
MDMA (Ecstasy): NOT DETECTED ng/mg{creat}
Methamphetamine: NOT DETECTED ng/mg{creat}

## 2024-12-05 LAB — CANNABINOIDS, MS, UR RFX
Cannabinoids Confirmation: POSITIVE
Carboxy-THC: >463 ng/mg{creat}

## 2024-12-06 ENCOUNTER — Ambulatory Visit: Payer: Self-pay | Admitting: Family Medicine
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
## 2024-12-10 ENCOUNTER — Ambulatory Visit: Payer: Self-pay | Admitting: Family Medicine

## 2024-12-17 ENCOUNTER — Encounter (HOSPITAL_COMMUNITY): Payer: Self-pay

## 2024-12-17 ENCOUNTER — Ambulatory Visit: Admitting: Family Medicine

## 2024-12-18 ENCOUNTER — Encounter (HOSPITAL_COMMUNITY): Payer: Self-pay

## 2024-12-19 ENCOUNTER — Ambulatory Visit (INDEPENDENT_AMBULATORY_CARE_PROVIDER_SITE_OTHER)

## 2024-12-19 DIAGNOSIS — F4322 Adjustment disorder with anxiety: Secondary | ICD-10-CM | POA: Diagnosis not present

## 2024-12-19 DIAGNOSIS — F411 Generalized anxiety disorder: Secondary | ICD-10-CM | POA: Diagnosis not present

## 2024-12-19 DIAGNOSIS — F4310 Post-traumatic stress disorder, unspecified: Secondary | ICD-10-CM
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
## 2024-12-23 ENCOUNTER — Encounter: Payer: Self-pay | Admitting: Oncology

## 2024-12-23 ENCOUNTER — Encounter: Payer: Self-pay | Admitting: Family Medicine

## 2024-12-24 ENCOUNTER — Encounter: Payer: Self-pay | Admitting: Family Medicine

## 2024-12-24 ENCOUNTER — Encounter: Payer: Self-pay | Admitting: Oncology

## 2024-12-24 ENCOUNTER — Ambulatory Visit: Admitting: Family Medicine

## 2025-01-02 ENCOUNTER — Other Ambulatory Visit: Payer: Self-pay | Admitting: Family Medicine

## 2025-01-02 ENCOUNTER — Other Ambulatory Visit: Payer: Self-pay

## 2025-01-02 ENCOUNTER — Other Ambulatory Visit (HOSPITAL_COMMUNITY): Payer: Self-pay

## 2025-01-02 ENCOUNTER — Encounter: Payer: Self-pay | Admitting: Oncology

## 2025-01-02 ENCOUNTER — Encounter: Payer: Self-pay | Admitting: Family Medicine

## 2025-01-02 DIAGNOSIS — Z5181 Encounter for therapeutic drug level monitoring: Secondary | ICD-10-CM

## 2025-01-02 DIAGNOSIS — F1120 Opioid dependence, uncomplicated: Secondary | ICD-10-CM

## 2025-01-02 DIAGNOSIS — F901 Attention-deficit hyperactivity disorder, predominantly hyperactive type: Secondary | ICD-10-CM

## 2025-01-03 ENCOUNTER — Other Ambulatory Visit (HOSPITAL_COMMUNITY): Payer: Self-pay

## 2025-01-03 ENCOUNTER — Encounter: Payer: Self-pay | Admitting: Family Medicine

## 2025-01-03 DIAGNOSIS — F1120 Opioid dependence, uncomplicated: Secondary | ICD-10-CM

## 2025-01-03 DIAGNOSIS — F901 Attention-deficit hyperactivity disorder, predominantly hyperactive type: Secondary | ICD-10-CM

## 2025-01-03 MED ORDER — BUPRENORPHINE HCL-NALOXONE HCL 8-2 MG SL FILM
1.0000 | ORAL_FILM | Freq: Four times a day (QID) | SUBLINGUAL | 0 refills | Status: AC
  Filled 2025-01-03: qty 120, 30d supply, fill #0

## 2025-01-03 MED ORDER — AMPHETAMINE-DEXTROAMPHET ER 20 MG PO CP24
20.0000 mg | ORAL_CAPSULE | Freq: Every day | ORAL | 0 refills | Status: AC
  Filled 2025-01-03: qty 30, 30d supply, fill #0

## 2025-01-03 MED ORDER — AMPHETAMINE-DEXTROAMPHET ER 10 MG PO CP24
10.0000 mg | ORAL_CAPSULE | Freq: Every day | ORAL | 0 refills | Status: AC
  Filled 2025-01-03: qty 30, 30d supply, fill #0
